# Patient Record
Sex: Female | Born: 1940 | Race: Black or African American | Hispanic: No | Marital: Married | Smoking: Former smoker
Health system: Southern US, Community
[De-identification: ages and names within clinical notes are randomized; demographics above are authoritative.]

## PROBLEM LIST (undated history)

## (undated) ENCOUNTER — Emergency Department (HOSPITAL_COMMUNITY): Admission: EM | Payer: Medicare Other | Source: Home / Self Care

## (undated) DIAGNOSIS — Q2730 Arteriovenous malformation, site unspecified: Secondary | ICD-10-CM

## (undated) DIAGNOSIS — F419 Anxiety disorder, unspecified: Secondary | ICD-10-CM

## (undated) DIAGNOSIS — H539 Unspecified visual disturbance: Secondary | ICD-10-CM

## (undated) DIAGNOSIS — N179 Acute kidney failure, unspecified: Secondary | ICD-10-CM

## (undated) DIAGNOSIS — R296 Repeated falls: Secondary | ICD-10-CM

## (undated) DIAGNOSIS — J189 Pneumonia, unspecified organism: Secondary | ICD-10-CM

## (undated) DIAGNOSIS — I2510 Atherosclerotic heart disease of native coronary artery without angina pectoris: Secondary | ICD-10-CM

## (undated) DIAGNOSIS — F329 Major depressive disorder, single episode, unspecified: Secondary | ICD-10-CM

## (undated) DIAGNOSIS — R569 Unspecified convulsions: Secondary | ICD-10-CM

## (undated) DIAGNOSIS — K922 Gastrointestinal hemorrhage, unspecified: Secondary | ICD-10-CM

## (undated) DIAGNOSIS — F32A Depression, unspecified: Secondary | ICD-10-CM

## (undated) DIAGNOSIS — N189 Chronic kidney disease, unspecified: Secondary | ICD-10-CM

## (undated) DIAGNOSIS — M069 Rheumatoid arthritis, unspecified: Secondary | ICD-10-CM

## (undated) DIAGNOSIS — I82409 Acute embolism and thrombosis of unspecified deep veins of unspecified lower extremity: Secondary | ICD-10-CM

## (undated) DIAGNOSIS — R011 Cardiac murmur, unspecified: Secondary | ICD-10-CM

## (undated) DIAGNOSIS — I5032 Chronic diastolic (congestive) heart failure: Secondary | ICD-10-CM

## (undated) DIAGNOSIS — R29898 Other symptoms and signs involving the musculoskeletal system: Secondary | ICD-10-CM

## (undated) DIAGNOSIS — E7800 Pure hypercholesterolemia, unspecified: Secondary | ICD-10-CM

## (undated) DIAGNOSIS — R55 Syncope and collapse: Secondary | ICD-10-CM

## (undated) DIAGNOSIS — I422 Other hypertrophic cardiomyopathy: Secondary | ICD-10-CM

## (undated) DIAGNOSIS — I10 Essential (primary) hypertension: Secondary | ICD-10-CM

## (undated) DIAGNOSIS — I340 Nonrheumatic mitral (valve) insufficiency: Secondary | ICD-10-CM

## (undated) DIAGNOSIS — N200 Calculus of kidney: Secondary | ICD-10-CM

## (undated) DIAGNOSIS — G43909 Migraine, unspecified, not intractable, without status migrainosus: Secondary | ICD-10-CM

## (undated) HISTORY — DX: Syncope and collapse: R55

## (undated) HISTORY — PX: CATARACT EXTRACTION W/ INTRAOCULAR LENS  IMPLANT, BILATERAL: SHX1307

## (undated) HISTORY — DX: Depression, unspecified: F32.A

## (undated) HISTORY — PX: ABDOMINAL HYSTERECTOMY: SHX81

## (undated) HISTORY — PX: TOENAIL AVULSION: SHX1074

## (undated) HISTORY — DX: Unspecified visual disturbance: H53.9

## (undated) HISTORY — DX: Essential (primary) hypertension: I10

## (undated) HISTORY — PX: CARPAL TUNNEL RELEASE: SHX101

## (undated) HISTORY — DX: Rheumatoid arthritis, unspecified: M06.9

## (undated) HISTORY — DX: Pure hypercholesterolemia, unspecified: E78.00

## (undated) HISTORY — DX: Major depressive disorder, single episode, unspecified: F32.9

---
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
## 1998-12-07 ENCOUNTER — Ambulatory Visit (HOSPITAL_COMMUNITY): Admission: RE | Admit: 1998-12-07 | Discharge: 1998-12-07 | Payer: Self-pay | Admitting: *Deleted

## 1998-12-08 ENCOUNTER — Encounter: Payer: Self-pay | Admitting: *Deleted

## 1999-03-30 ENCOUNTER — Other Ambulatory Visit: Admission: RE | Admit: 1999-03-30 | Discharge: 1999-03-30 | Payer: Self-pay | Admitting: Obstetrics and Gynecology

## 2000-04-03 ENCOUNTER — Other Ambulatory Visit: Admission: RE | Admit: 2000-04-03 | Discharge: 2000-04-03 | Payer: Self-pay | Admitting: Obstetrics and Gynecology

## 2001-03-14 ENCOUNTER — Encounter: Admission: RE | Admit: 2001-03-14 | Discharge: 2001-06-12 | Payer: Self-pay | Admitting: Internal Medicine

## 2001-04-15 ENCOUNTER — Other Ambulatory Visit: Admission: RE | Admit: 2001-04-15 | Discharge: 2001-04-15 | Payer: Self-pay | Admitting: Internal Medicine

## 2001-07-22 ENCOUNTER — Encounter (INDEPENDENT_AMBULATORY_CARE_PROVIDER_SITE_OTHER): Payer: Self-pay | Admitting: *Deleted

## 2001-07-22 ENCOUNTER — Ambulatory Visit (HOSPITAL_COMMUNITY): Admission: RE | Admit: 2001-07-22 | Discharge: 2001-07-22 | Payer: Self-pay | Admitting: Gastroenterology

## 2001-09-19 ENCOUNTER — Other Ambulatory Visit: Admission: RE | Admit: 2001-09-19 | Discharge: 2001-09-19 | Payer: Self-pay | Admitting: Obstetrics & Gynecology

## 2001-10-22 ENCOUNTER — Encounter (INDEPENDENT_AMBULATORY_CARE_PROVIDER_SITE_OTHER): Payer: Self-pay | Admitting: Specialist

## 2001-10-22 ENCOUNTER — Ambulatory Visit (HOSPITAL_COMMUNITY): Admission: RE | Admit: 2001-10-22 | Discharge: 2001-10-22 | Payer: Self-pay | Admitting: Obstetrics & Gynecology

## 2002-07-25 ENCOUNTER — Encounter: Admission: RE | Admit: 2002-07-25 | Discharge: 2002-07-25 | Payer: Self-pay | Admitting: Rheumatology

## 2002-07-25 ENCOUNTER — Encounter: Payer: Self-pay | Admitting: Rheumatology

## 2003-07-07 ENCOUNTER — Encounter: Admission: RE | Admit: 2003-07-07 | Discharge: 2003-07-07 | Payer: Self-pay | Admitting: Internal Medicine

## 2005-08-18 ENCOUNTER — Observation Stay (HOSPITAL_COMMUNITY): Admission: EM | Admit: 2005-08-18 | Discharge: 2005-08-19 | Payer: Self-pay | Admitting: Emergency Medicine

## 2006-05-23 ENCOUNTER — Inpatient Hospital Stay (HOSPITAL_COMMUNITY): Admission: EM | Admit: 2006-05-23 | Discharge: 2006-05-29 | Payer: Self-pay | Admitting: *Deleted

## 2006-09-07 ENCOUNTER — Emergency Department (HOSPITAL_COMMUNITY): Admission: EM | Admit: 2006-09-07 | Discharge: 2006-09-07 | Payer: Self-pay | Admitting: Emergency Medicine

## 2008-01-07 ENCOUNTER — Emergency Department (HOSPITAL_COMMUNITY): Admission: EM | Admit: 2008-01-07 | Discharge: 2008-01-07 | Payer: Self-pay | Admitting: Emergency Medicine

## 2008-04-14 ENCOUNTER — Encounter: Admission: RE | Admit: 2008-04-14 | Discharge: 2008-04-14 | Payer: Self-pay | Admitting: Internal Medicine

## 2008-04-17 ENCOUNTER — Encounter: Admission: RE | Admit: 2008-04-17 | Discharge: 2008-04-17 | Payer: Self-pay | Admitting: Internal Medicine

## 2008-05-22 ENCOUNTER — Encounter: Admission: RE | Admit: 2008-05-22 | Discharge: 2008-05-22 | Payer: Self-pay | Admitting: Neurology

## 2008-07-21 ENCOUNTER — Encounter: Admission: RE | Admit: 2008-07-21 | Discharge: 2008-07-21 | Payer: Self-pay | Admitting: Endocrinology

## 2008-07-21 ENCOUNTER — Other Ambulatory Visit: Admission: RE | Admit: 2008-07-21 | Discharge: 2008-07-21 | Payer: Self-pay | Admitting: Interventional Radiology

## 2008-07-21 ENCOUNTER — Encounter (INDEPENDENT_AMBULATORY_CARE_PROVIDER_SITE_OTHER): Payer: Self-pay | Admitting: Interventional Radiology

## 2008-12-16 ENCOUNTER — Emergency Department (HOSPITAL_COMMUNITY): Admission: EM | Admit: 2008-12-16 | Discharge: 2008-12-16 | Payer: Self-pay | Admitting: Emergency Medicine

## 2009-02-08 ENCOUNTER — Inpatient Hospital Stay (HOSPITAL_COMMUNITY): Admission: EM | Admit: 2009-02-08 | Discharge: 2009-02-13 | Payer: Self-pay | Admitting: Emergency Medicine

## 2009-08-06 ENCOUNTER — Encounter: Admission: RE | Admit: 2009-08-06 | Discharge: 2009-08-06 | Payer: Self-pay | Admitting: Neurology

## 2009-12-06 ENCOUNTER — Emergency Department (HOSPITAL_COMMUNITY): Admission: EM | Admit: 2009-12-06 | Discharge: 2009-12-06 | Payer: Self-pay | Admitting: Emergency Medicine

## 2009-12-21 ENCOUNTER — Ambulatory Visit (HOSPITAL_COMMUNITY)
Admission: RE | Admit: 2009-12-21 | Discharge: 2009-12-21 | Payer: Self-pay | Source: Home / Self Care | Admitting: Otolaryngology

## 2010-02-06 ENCOUNTER — Encounter: Payer: Self-pay | Admitting: Internal Medicine

## 2010-02-07 ENCOUNTER — Encounter: Payer: Self-pay | Admitting: Neurology

## 2010-03-28 LAB — BASIC METABOLIC PANEL WITH GFR
Chloride: 105 meq/L (ref 96–112)
Creatinine, Ser: 1.42 mg/dL — ABNORMAL HIGH (ref 0.4–1.2)
GFR calc Af Amer: 44 mL/min — ABNORMAL LOW (ref >60)
Potassium: 3.9 meq/L (ref 3.5–5.1)
Sodium: 140 meq/L (ref 135–145)

## 2010-03-28 LAB — BASIC METABOLIC PANEL
BUN: 27 mg/dL — ABNORMAL HIGH (ref 6–23)
CO2: 25 mEq/L (ref 19–32)
Calcium: 9.9 mg/dL (ref 8.4–10.5)
GFR calc non Af Amer: 36 mL/min — ABNORMAL LOW (ref >60)
Glucose, Bld: 78 mg/dL (ref 70–99)

## 2010-03-28 LAB — CBC
HCT: 41.1 % (ref 36.0–46.0)
Hemoglobin: 14 g/dL (ref 12.0–15.0)
MCH: 29.4 pg (ref 26.0–34.0)
MCHC: 34.1 g/dL (ref 30.0–36.0)
MCV: 86.2 fL (ref 78.0–100.0)
Platelets: 296 K/uL (ref 150–400)
RBC: 4.77 MIL/uL (ref 3.87–5.11)
RDW: 13.9 % (ref 11.5–15.5)
WBC: 11 10*3/uL — ABNORMAL HIGH (ref 4.0–10.5)

## 2010-03-28 LAB — SURGICAL PCR SCREEN
MRSA, PCR: NEGATIVE
Staphylococcus aureus: NEGATIVE

## 2010-03-28 LAB — URINALYSIS, ROUTINE W REFLEX MICROSCOPIC
Bilirubin Urine: NEGATIVE
Glucose, UA: NEGATIVE mg/dL
Hgb urine dipstick: NEGATIVE
Ketones, ur: NEGATIVE mg/dL
Nitrite: NEGATIVE
Protein, ur: NEGATIVE mg/dL
Specific Gravity, Urine: 1.01 (ref 1.005–1.030)
Urobilinogen, UA: 1 mg/dL (ref 0.0–1.0)
pH: 5.5 (ref 5.0–8.0)

## 2010-03-29 ENCOUNTER — Other Ambulatory Visit: Payer: Self-pay | Admitting: Hematology and Oncology

## 2010-03-29 ENCOUNTER — Encounter: Payer: Self-pay | Admitting: Hematology and Oncology

## 2010-03-29 ENCOUNTER — Encounter (HOSPITAL_BASED_OUTPATIENT_CLINIC_OR_DEPARTMENT_OTHER): Payer: Medicare Other | Admitting: Internal Medicine

## 2010-03-29 ENCOUNTER — Ambulatory Visit (HOSPITAL_COMMUNITY)
Admission: RE | Admit: 2010-03-29 | Discharge: 2010-03-29 | Disposition: A | Discharge status: Home / Self Care | Payer: Medicare Other | Source: Ambulatory Visit | Attending: Hematology and Oncology | Admitting: Hematology and Oncology

## 2010-03-29 DIAGNOSIS — M899 Disorder of bone, unspecified: Secondary | ICD-10-CM | POA: Insufficient documentation

## 2010-03-29 DIAGNOSIS — C9000 Multiple myeloma not having achieved remission: Secondary | ICD-10-CM | POA: Insufficient documentation

## 2010-03-29 DIAGNOSIS — D472 Monoclonal gammopathy: Secondary | ICD-10-CM

## 2010-03-29 DIAGNOSIS — M5030 Other cervical disc degeneration, unspecified cervical region: Secondary | ICD-10-CM | POA: Insufficient documentation

## 2010-03-29 LAB — CBC WITH DIFFERENTIAL/PLATELET
Basophils Absolute: 0.1 10*3/uL (ref 0.0–0.1)
EOS%: 2.2 % (ref 0.0–7.0)
HCT: 35.9 % (ref 34.8–46.6)
LYMPH%: 27.5 % (ref 14.0–49.7)
NEUT%: 60.3 % (ref 38.4–76.8)
Platelets: 270 10*3/uL (ref 145–400)
RBC: 4.21 10*6/uL (ref 3.70–5.45)
WBC: 10 10*3/uL (ref 3.9–10.3)
lymph#: 2.7 10*3/uL (ref 0.9–3.3)

## 2010-03-29 LAB — COMPREHENSIVE METABOLIC PANEL
ALT: 15 U/L (ref 0–35)
AST: 25 U/L (ref 0–37)
BUN: 30 mg/dL — ABNORMAL HIGH (ref 6–23)
CO2: 27 mEq/L (ref 19–32)
Chloride: 104 mEq/L (ref 96–112)
Creatinine, Ser: 1.35 mg/dL — ABNORMAL HIGH (ref 0.40–1.20)
Glucose, Bld: 107 mg/dL — ABNORMAL HIGH (ref 70–99)
Potassium: 4.3 mEq/L (ref 3.5–5.3)

## 2010-03-29 LAB — DIFFERENTIAL
Basophils Absolute: 0 10*3/uL (ref 0.0–0.1)
Monocytes Absolute: 0.9 10*3/uL (ref 0.1–1.0)
Monocytes Relative: 9 % (ref 3–12)
Neutrophils Relative %: 63 % (ref 43–77)

## 2010-03-29 LAB — MORPHOLOGY: PLT EST: ADEQUATE

## 2010-03-29 LAB — POCT I-STAT, CHEM 8
Chloride: 110 mEq/L (ref 96–112)
Creatinine, Ser: 1.5 mg/dL — ABNORMAL HIGH (ref 0.4–1.2)
HCT: 42 % (ref 36.0–46.0)
Sodium: 142 mEq/L (ref 135–145)
TCO2: 25 mmol/L (ref 0–100)

## 2010-03-29 LAB — CBC
HCT: 38.6 % (ref 36.0–46.0)
MCH: 29.9 pg (ref 26.0–34.0)
MCHC: 34.3 g/dL (ref 30.0–36.0)
MCV: 87.3 fL (ref 78.0–100.0)
RDW: 13.9 % (ref 11.5–15.5)
WBC: 9.4 10*3/uL (ref 4.0–10.5)

## 2010-03-29 LAB — PROTIME-INR: Prothrombin Time: 14.1 seconds (ref 11.6–15.2)

## 2010-04-01 LAB — SPEP & IFE WITH QIG
Albumin ELP: 45 % — ABNORMAL LOW (ref 55.8–66.1)
Alpha-1-Globulin: 4.6 % (ref 2.9–4.9)
Alpha-2-Globulin: 13.2 % — ABNORMAL HIGH (ref 7.1–11.8)
Beta Globulin: 5.5 % (ref 4.7–7.2)
Total Protein, Serum Electrophoresis: 8.2 g/dL (ref 6.0–8.3)

## 2010-04-01 LAB — KAPPA/LAMBDA LIGHT CHAINS: Kappa free light chain: 2.34 mg/dL — ABNORMAL HIGH (ref 0.33–1.94)

## 2010-04-03 LAB — DIFFERENTIAL
Basophils Absolute: 0 10*3/uL (ref 0.0–0.1)
Basophils Relative: 0 % (ref 0–1)
Eosinophils Absolute: 0 10*3/uL (ref 0.0–0.7)
Eosinophils Relative: 0 % (ref 0–5)
Lymphocytes Relative: 3 % — ABNORMAL LOW (ref 12–46)
Lymphocytes Relative: 3 % — ABNORMAL LOW (ref 12–46)
Monocytes Relative: 3 % (ref 3–12)
Neutro Abs: 24.7 10*3/uL — ABNORMAL HIGH (ref 1.7–7.7)
Neutrophils Relative %: 93 % — ABNORMAL HIGH (ref 43–77)

## 2010-04-03 LAB — COMPREHENSIVE METABOLIC PANEL
ALT: 16 U/L (ref 0–35)
ALT: 16 U/L (ref 0–35)
AST: 28 U/L (ref 0–37)
Albumin: 2.2 g/dL — ABNORMAL LOW (ref 3.5–5.2)
Albumin: 2.5 g/dL — ABNORMAL LOW (ref 3.5–5.2)
Albumin: 2.8 g/dL — ABNORMAL LOW (ref 3.5–5.2)
Alkaline Phosphatase: 75 U/L (ref 39–117)
Alkaline Phosphatase: 76 U/L (ref 39–117)
BUN: 38 mg/dL — ABNORMAL HIGH (ref 6–23)
CO2: 21 mEq/L (ref 19–32)
Calcium: 7.8 mg/dL — ABNORMAL LOW (ref 8.4–10.5)
Calcium: 8 mg/dL — ABNORMAL LOW (ref 8.4–10.5)
Chloride: 113 mEq/L — ABNORMAL HIGH (ref 96–112)
Creatinine, Ser: 2.16 mg/dL — ABNORMAL HIGH (ref 0.4–1.2)
Creatinine, Ser: 2.33 mg/dL — ABNORMAL HIGH (ref 0.4–1.2)
GFR calc Af Amer: 27 mL/min — ABNORMAL LOW (ref >60)
GFR calc Af Amer: 35 mL/min — ABNORMAL LOW (ref >60)
GFR calc non Af Amer: 23 mL/min — ABNORMAL LOW (ref >60)
Glucose, Bld: 89 mg/dL (ref 70–99)
Potassium: 3.1 mEq/L — ABNORMAL LOW (ref 3.5–5.1)
Potassium: 3.9 mEq/L (ref 3.5–5.1)
Sodium: 141 mEq/L (ref 135–145)
Total Bilirubin: 1 mg/dL (ref 0.3–1.2)
Total Protein: 6 g/dL (ref 6.0–8.3)
Total Protein: 6.7 g/dL (ref 6.0–8.3)

## 2010-04-03 LAB — RETICULOCYTES
RBC.: 3.56 MIL/uL — ABNORMAL LOW (ref 3.87–5.11)
Retic Count, Absolute: 28.5 10*3/uL (ref 19.0–186.0)
Retic Ct Pct: 0.8 % (ref 0.4–3.1)

## 2010-04-03 LAB — BASIC METABOLIC PANEL
BUN: 14 mg/dL (ref 6–23)
BUN: 15 mg/dL (ref 6–23)
CO2: 18 mEq/L — ABNORMAL LOW (ref 19–32)
Calcium: 9.2 mg/dL (ref 8.4–10.5)
Chloride: 113 mEq/L — ABNORMAL HIGH (ref 96–112)
Chloride: 117 mEq/L — ABNORMAL HIGH (ref 96–112)
Creatinine, Ser: 1.1 mg/dL (ref 0.4–1.2)
GFR calc non Af Amer: 39 mL/min — ABNORMAL LOW (ref >60)
GFR calc non Af Amer: 49 mL/min — ABNORMAL LOW (ref >60)
Glucose, Bld: 83 mg/dL (ref 70–99)
Glucose, Bld: 91 mg/dL (ref 70–99)
Potassium: 3.7 mEq/L (ref 3.5–5.1)
Potassium: 4 mEq/L (ref 3.5–5.1)
Potassium: 4.7 mEq/L (ref 3.5–5.1)
Sodium: 140 mEq/L (ref 135–145)

## 2010-04-03 LAB — GLUCOSE, CAPILLARY
Glucose-Capillary: 102 mg/dL — ABNORMAL HIGH (ref 70–99)
Glucose-Capillary: 102 mg/dL — ABNORMAL HIGH (ref 70–99)
Glucose-Capillary: 120 mg/dL — ABNORMAL HIGH (ref 70–99)
Glucose-Capillary: 175 mg/dL — ABNORMAL HIGH (ref 70–99)
Glucose-Capillary: 77 mg/dL (ref 70–99)
Glucose-Capillary: 78 mg/dL (ref 70–99)
Glucose-Capillary: 89 mg/dL (ref 70–99)
Glucose-Capillary: 92 mg/dL (ref 70–99)
Glucose-Capillary: 99 mg/dL (ref 70–99)

## 2010-04-03 LAB — CULTURE, BLOOD (ROUTINE X 2)

## 2010-04-03 LAB — CBC
HCT: 28.3 % — ABNORMAL LOW (ref 36.0–46.0)
HCT: 30.7 % — ABNORMAL LOW (ref 36.0–46.0)
HCT: 30.8 % — ABNORMAL LOW (ref 36.0–46.0)
HCT: 35.3 % — ABNORMAL LOW (ref 36.0–46.0)
HCT: 37.4 % (ref 36.0–46.0)
Hemoglobin: 9.7 g/dL — ABNORMAL LOW (ref 12.0–15.0)
MCHC: 32.7 g/dL (ref 30.0–36.0)
MCHC: 33.2 g/dL (ref 30.0–36.0)
MCHC: 33.8 g/dL (ref 30.0–36.0)
MCV: 86.9 fL (ref 78.0–100.0)
MCV: 87.8 fL (ref 78.0–100.0)
MCV: 88.4 fL (ref 78.0–100.0)
MCV: 88.4 fL (ref 78.0–100.0)
MCV: 88.9 fL (ref 78.0–100.0)
Platelets: 178 10*3/uL (ref 150–400)
Platelets: 199 10*3/uL (ref 150–400)
Platelets: 201 10*3/uL (ref 150–400)
Platelets: 232 10*3/uL (ref 150–400)
Platelets: 246 10*3/uL (ref 150–400)
RBC: 3.66 MIL/uL — ABNORMAL LOW (ref 3.87–5.11)
RDW: 14 % (ref 11.5–15.5)
RDW: 14.4 % (ref 11.5–15.5)
RDW: 14.6 % (ref 11.5–15.5)
RDW: 14.9 % (ref 11.5–15.5)
RDW: 15.2 % (ref 11.5–15.5)
WBC: 15.1 10*3/uL — ABNORMAL HIGH (ref 4.0–10.5)
WBC: 16.1 10*3/uL — ABNORMAL HIGH (ref 4.0–10.5)
WBC: 16.7 10*3/uL — ABNORMAL HIGH (ref 4.0–10.5)
WBC: 20.7 10*3/uL — ABNORMAL HIGH (ref 4.0–10.5)
WBC: 26.3 10*3/uL — ABNORMAL HIGH (ref 4.0–10.5)

## 2010-04-03 LAB — URINALYSIS, ROUTINE W REFLEX MICROSCOPIC
Bilirubin Urine: NEGATIVE
Glucose, UA: NEGATIVE mg/dL
Hgb urine dipstick: NEGATIVE
Ketones, ur: NEGATIVE mg/dL
Nitrite: NEGATIVE
Specific Gravity, Urine: 1.007 (ref 1.005–1.030)
Specific Gravity, Urine: 1.017 (ref 1.005–1.030)
pH: 6.5 (ref 5.0–8.0)

## 2010-04-03 LAB — URINE CULTURE
Colony Count: >=100000
Colony Count: NO GROWTH
Culture: NO GROWTH

## 2010-04-03 LAB — LACTIC ACID, PLASMA
Lactic Acid, Venous: 0.7 mmol/L (ref 0.5–2.2)
Lactic Acid, Venous: 1.1 mmol/L (ref 0.5–2.2)

## 2010-04-03 LAB — URINE MICROSCOPIC-ADD ON

## 2010-04-03 LAB — MAGNESIUM: Magnesium: 1.6 mg/dL (ref 1.5–2.5)

## 2010-04-03 LAB — TSH: TSH: 0.058 u[IU]/mL — ABNORMAL LOW (ref 0.350–4.500)

## 2010-04-03 LAB — SODIUM, URINE, RANDOM: Sodium, Ur: 97 mEq/L

## 2010-04-03 LAB — HEMOGLOBIN A1C: Hgb A1c MFr Bld: 5.9 % (ref 4.6–6.1)

## 2010-04-03 LAB — IRON AND TIBC: UIBC: 135 ug/dL

## 2010-04-03 LAB — CREATININE, URINE, RANDOM: Creatinine, Urine: 28.2 mg/dL

## 2010-04-07 ENCOUNTER — Encounter (HOSPITAL_BASED_OUTPATIENT_CLINIC_OR_DEPARTMENT_OTHER): Payer: Medicare Other | Admitting: Hematology and Oncology

## 2010-04-07 DIAGNOSIS — D472 Monoclonal gammopathy: Secondary | ICD-10-CM

## 2010-04-12 ENCOUNTER — Encounter (HOSPITAL_BASED_OUTPATIENT_CLINIC_OR_DEPARTMENT_OTHER): Payer: Medicare Other | Admitting: Hematology and Oncology

## 2010-04-12 DIAGNOSIS — D472 Monoclonal gammopathy: Secondary | ICD-10-CM

## 2010-04-15 ENCOUNTER — Other Ambulatory Visit: Payer: Self-pay | Admitting: Hematology and Oncology

## 2010-04-15 ENCOUNTER — Encounter (HOSPITAL_BASED_OUTPATIENT_CLINIC_OR_DEPARTMENT_OTHER): Payer: Medicare Other | Admitting: Hematology and Oncology

## 2010-04-15 ENCOUNTER — Other Ambulatory Visit (HOSPITAL_COMMUNITY)
Admission: RE | Admit: 2010-04-15 | Discharge: 2010-04-15 | Disposition: A | Discharge status: Home / Self Care | Payer: Medicare Other | Source: Ambulatory Visit | Attending: Hematology and Oncology | Admitting: Hematology and Oncology

## 2010-04-15 DIAGNOSIS — D649 Anemia, unspecified: Secondary | ICD-10-CM

## 2010-04-15 DIAGNOSIS — D72829 Elevated white blood cell count, unspecified: Secondary | ICD-10-CM

## 2010-04-21 ENCOUNTER — Encounter (HOSPITAL_BASED_OUTPATIENT_CLINIC_OR_DEPARTMENT_OTHER): Payer: Medicare Other | Admitting: Hematology and Oncology

## 2010-04-21 DIAGNOSIS — D472 Monoclonal gammopathy: Secondary | ICD-10-CM

## 2010-06-03 NOTE — Op Note (Signed)
   NAME:  [name], [name]                         ACCOUNT NO.:  [id number]   MEDICAL RECORD NO.:  [id number]                   PATIENT TYPE:  AMB   LOCATION:  SDC                                  FACILITY:  [facility]   PHYSICIAN:  Agnes Lawrence, M.D.         DATE OF BIRTH:  1941/01/15   DATE OF PROCEDURE:  10/22/2001  DATE OF DISCHARGE:                                 OPERATIVE REPORT   PREOPERATIVE DIAGNOSES:  Postmenopausal bleeding with previous in office  biopsies demonstrating vaginal hyperplasia.   POSTOPERATIVE DIAGNOSES:  Postmenopausal bleeding with previous in office  biopsies demonstrating vaginal hyperplasia.   PROCEDURE:  Biopsy of vaginal cuff as well as right-sided vaginal introitus  biopsy.   SURGEON:  Agnes Lawrence, M.D.   ANESTHESIA:  Spinal.   COMPLICATIONS:  None.   ESTIMATED BLOOD LOSS:  Approximately 50 cc.   FINDINGS:  Upon inspection of the vagina under anesthesia, the vaginal wall  was palpably thickened.  There was a slightly raised hemorrhagic appearing  lesion at the right introitus approximately 1 cm in diameter.   PROCEDURE:  The patient was taken to the operating room.  A sterile weighted  speculum was placed in the patient's vagina as well as Deavers for  retraction.  The vaginal mucosa of the cuff was then grasped with Allis  clamps.  The vaginal mucosa was then incised and an approximate 2 cm in  diameter biopsy was then taken of the vaginal cuff.  The vaginal mucosa cut  edges were then reapproximated with interrupted sutures of 2-0 chromic.  Excellent hemostasis was obtained.  The lesion noted on the right introitus  was then grasped as well with an Allis clamp and this was excised with a  scalpel.  Again, the cut mucosal edges were then reapproximated with  interrupted sutures of 3-0 Vicryl.  Again, excellent hemostasis was noted.  The patient tolerated procedure well.  The patient was taken to the PACU in  stable condition.   PATHOLOGY:  Vaginal cuff biopsy and vaginal introitus biopsy.                                               Agnes Lawrence, M.D.    Howell Pringle  D:  10/22/2001  T:  10/22/2001  Job:  [id number]

## 2010-06-03 NOTE — Discharge Summary (Signed)
[name], [name]               ACCOUNT NO.:  [id number]   MEDICAL RECORD NO.:  [id number]          PATIENT TYPE:  INP   LOCATION:  1430                         FACILITY:  [facility]   PHYSICIAN:  Rexene Alberts, M.D.    DATE OF BIRTH:  1940-04-15   DATE OF ADMISSION:  05/22/2006  DATE OF DISCHARGE:  05/29/2006                               DISCHARGE SUMMARY   PRIMARY CARE PHYSICIAN:  Dr. Glendale Chard.   DISCHARGE DIAGNOSES:  1. Escherichia coli bacteremia.  The Escherichia coli was pan      sensitive.  The patient was treated with 7 days of intravenous      Zosyn.  2. Urinary tract infection.  The urine culture was not obtained during      hospital course.  However, more than likely, it was secondary to      Escherichia coli.  3. Pneumonia.  4. Hypertension.  5. Altered mental status secondary to number one.  Clinically resolved      during the hospitalization  6. Leukocytosis secondary to number one.  The patient's white blood      cell count was 22.3 on admission and it is now 15.  7. Tachycardia and fever on admission, thought to be secondary to      number one.  Both resolved during hospital course.  8. Mild hematuria thought to be secondary to the urinary tract      infection, Foley catheter, and Lovenox.  9. Low TSH and normal T4.   SECONDARY DISCHARGE DIAGNOSES:  1. Hypertension.  2. Rheumatoid arthritis.  3. Depression.  4. Osteoporosis.   DISCHARGE MEDICATIONS:  1. Augmentin 500 mg b.i.d. for four more days.  2. Toprol XL 25 mg, 1 tablet daily (new medication).  3. Methotrexate 2.5 mg, 7 pills every Wednesday.  4. Detrol 4 mg daily.  5. Effexor XR 75 mg daily.  6. Actonel 35 mg weekly.  7. Humira one-shot every 2 weeks.  8. Maxinate 1 pill daily.  9. Phenergan 12.5 mg every 4 hours as needed for nausea.   HISTORY OF PRESENT ILLNESS:  The patient is a 70 year old woman with a  past medical history significant for rheumatoid arthritis.  She  presented to the  emergency department on [id number] with several episodes  of urinary incontinence and confusion.  When she arrived to the  emergency department, her temperature was found to be elevated at 104.8.  Her urinalysis was positive for WBCs.  She was given 1 gram of Rocephin  in the emergency department and subsequently admitted to the step-down  unit for further management.   For additional details, please see the dictated history and physical by  Dr. Arnoldo Morale.   HOSPITAL COURSE:  1. URINARY TRACT INFECTION/E-COLI BACTEREMIA:  On admission, the      patient was quite febrile.  Her heart rate was elevated at 155      beats per minute and her blood pressure was 167/93.  She was      oxygenating 98% on 2 liters of nasal cannula oxygen.  As stated      above, she  was given 1 gram of Rocephin intravenously in the      emergency department.  She was also given a bolus of normal saline.      On admission, her white blood cell count was 22.3.  Blood cultures      were ordered.  Her urinalysis was positive for bacteria and white      blood cells.  However, unfortunately, the urine culture was not      performed.  The patient was started on empiric antibiotic treatment      with Zosyn and vancomycin for coverage of possible sepsis secondary      to the urinary tract infection.  Over the course of the      hospitalization, the blood cultures became positive for E-coli,      which was pansensitive to the antibiotics.  Gradually, the      patient's mental status improved.  She became less and less      symptomatic.  There was one episode of gross hematuria thought to      be secondary to Lovenox.  The Lovenox was discontinued.  The Foley      catheter was subsequently removed.  Her white blood cell count is      now 15,000.  She has been afebrile for several days.  She received      a total of 7 days of Zosyn and vancomycin.  She will be discharged      to home on four more days of Augmentin 500 mg  b.i.d.   1. PNEUMONIA:  On hospital day #3, the patient complained of shortness      of breath.  A chest x-ray was obtained, and it revealed small to      moderate bilateral pleural effusions, interstitial opacities and      right perihilar air space opacity, possibly reflecting atypical      pneumonia or pulmonary edema.  The patient was treated with Lasix      intravenously.  Antibiotic therapy was continued with vancomycin      and Zosyn.  She was also treated with as-needed Xopenex.  Following      these measures, the patient's shortness of breath completely      resolved.  A followup chest x-ray on May 13. 2008 revealed clearing      of the right basilar opacity.   1. HYPERTENSION:  The patient's blood pressures were moderately      elevated during hospital course.  She was initially started on      metoprolol t.i.d.; however, for better compliance, she was      discharged to home on Toprol XL 25 mg daily.  Her blood pressure      prior to hospital discharge was 134/60.   1. LOW TSH AND NORMAL FREE T4:  The patient's TSH was assessed during      the hospital course.  It was found to be low at 0.023.  Followup      lab work revealed a free T4 of 0.92 (within normal limits) and a      normal free T3 of 2.6.   1. MILD DECONDITIONING:  The physical and occupational therapist      evaluated the patient during the hospital course.  They recommended      home health PT and a registered nurse for home safety evaluation.      She had no occupational therapy needs.  However, the occupational  therapist recommended a tub transfer bench, which was ordered.   DISCHARGE DISPOSITION:  The patient was discharged to home in improved  and stable condition.  She was advised to follow up with her primary  care physician Dr. Glendale Chard in 1 week and her rheumatologist, Dr.  Estanislado Pandy in 2-4 weeks.      Rexene Alberts, M.D.  Electronically Signed    DF/MEDQ  D:  05/29/2006  T:   05/29/2006  Job:  [id number]   cc:   Theda Belfast. Baird Cancer, M.D.  Fax: [phone]   Leary Roca, MD  Fax: [phone]

## 2010-06-03 NOTE — Discharge Summary (Signed)
NAMEDAISE, GIOVANELLI               ACCOUNT NO.:  [id number]   MEDICAL RECORD NO.:  [id number]          PATIENT TYPE:  INP   LOCATION:  5039                         FACILITY:  [facility]   PHYSICIAN:  Cyril Mourning, D.O.    DATE OF BIRTH:  [birth year]   DATE OF ADMISSION:  08/18/2005  DATE OF DISCHARGE:  08/19/2005                                 DISCHARGE SUMMARY   PRIMARY CARE PHYSICIAN:  Robyn N. Baird Cancer, M.D.   RHEUMATOLOGIST:  Leary Roca, MD.   REASON FOR ADMISSION:  Rash following treatment for pyelonephritis and E.  coli bacteremia.   DIAGNOSES AT TIME OF DISCHARGE:  1.  Palpable purpura without coagulopathy and no evidence of      thrombocytopenia; and no pulmonary or renal manifestations.  Clinically      felt to be related to a hypersensitivity reaction either to recently      treated infection or perhaps ciprofloxacin.  2.  Intervally treated Escherichia coli bacteremia/pyelonephritis, initially      diagnosed 08/10/2005 with clinical response to Cipro in the outpatient      setting.  She will conclude therapy with Keflex until 08/24/2005 as her      cultures revealed a pansensitive strain of E. coli; and this will      conclude 14 days of therapy.  3.  Known rheumatoid arthritis.  4.  Status post cholecystectomy, hysterectomy, and a remote history of a      deep venous thrombus.   MEDICATIONS ON DISCHARGE:  Unfortunately Ms. Vanrossum is not absolutely clear  on her medications though she brings a list there are no frequencies  provided.  I have instructed her to resume her Nabumetone as prescribed by  her primary care physician including folic acid, hydrochloric Bextra.  She  has 2 listings:  The generic meloxicam as well as the brand Mobic.  I have  educated her on these to not exceed 2 of these tablets for a 24-hour period.  She takes methotrexate on a weekly basis.  Effexor, she states that she has  not been taking.  Zyrtec and Detrol.   LABORATORY DATA:  As noted  above, her coags including her PT/INR and PTT  were all within normal range with PTT being 25, her PT was 14.3, INR was  1.1.  Her urinalysis did reveal 3-6 wbc's and 0-2 rbc's.  Her urinalysis  revealed trace leukocyte esterase and urine culture currently is negative to  date.  Her CBC did reveal a WBC of 15.1, hemoglobin of 13.9, platelet count  of [id number], basic metabolic panel of Q000111Q sodium, potassium 4.2, chloride 106,  CO2 29, BUN 11, creatinine 1.0.  Her chest x-ray revealed chronic bronchitic  changes and no evidence of pneumonia.   These tests are being ordered prior to discharge and the patient is  instructed to followup on these results as most of these are send out tests  that will not be available for several days.  She is getting a p-ANCA, c-  ANCA, C3 and C4 complement levels as well as an antidouble stranded DNA.  She  is getting a T3 and T4 to followup on a low TSH during this  hospitalization.  It was recommended that these be correlated and repeated  if they are abnormal.   HISTORY OF PRESENTING ILLNESS:  Ms. Kupka is a very pleasant 70 year old  African-American female who was in her usual state of health up until a  little over a week ago when she developed some nausea, vomiting, and  symptoms felt to be related to urinary tract infection.  She had chills.  She had been discovered to have a pansensitive E. coli urinary tract  infection/pyelonephritis and had been initiated on ciprofloxacin starting at  that time and having completed almost a weeks' worth of therapy with  ciprofloxacin; she has developed a lower extremity swelling and rash which  began with edema and discomfort though had resolved with regard to the  swelling at the time of presentation.  She was directed to the emergency  department for further evaluation.   HOSPITAL COURSE:  Ms. Youngstrom presented to the emergency department.  She had  no immediate laboratory data available with the exception that 1-week  prior  cultures of her blood and urine that were consistent with E. coli bacteremia  due to pyelonephritis.  Again, her E. coli was pansensitive.  She does have  a previous history of rheumatoid arthritis managed by Dr. Estanislado Pandy.  In any  event, her laboratory data did become available revealing no evidence of  thrombocytopenia nor evidence of coagulopathy.  She did not complain of  abdominal discomfort; and, in fact, clinically she had appeared to  completely resolve with the reference to her bacteremia and pyelonephritis.  Her medications were reviewed and it was not absolutely clear as to the  etiology of her palpable purpura without the classic constellation of  symptoms consistent with Henoch-Schonlein purpura.  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  I do not see clinical  manifestations of pulmonary, renal involvement, or other systemic  involvement to suggest the vasculitis; however, I will send a few send out  laboratory studies that her primary care physician and rheumatologist can  followup in the outpatient setting.  I feel that these are of low clinical  likelihood; however, these may help in establishing the diagnosis of a  likely hypersensitivity response.   I am recommending a followup series of labs within 1-2 weeks, via her  primary care physician.      Cyril Mourning, D.O.  Electronically Signed     ESS/MEDQ  D:  08/19/2005  T:  08/19/2005  Job:  [id number]   cc:   Theda Belfast. Baird Cancer, M.D.  Leary Roca, MD

## 2010-06-03 NOTE — H&P (Signed)
NAME:  [name], [name]                         ACCOUNT NO.:  [id number]   MEDICAL RECORD NO.:  [id number]                   PATIENT TYPE:  AMB   LOCATION:  SDC                                  FACILITY:  [facility]   PHYSICIAN:  Agnes Lawrence, M.D.         DATE OF BIRTH:  1940/03/08   DATE OF ADMISSION:  DATE OF DISCHARGE:                                HISTORY & PHYSICAL   CHIEF COMPLAINT:  The patient is a 70 year old status post hysterectomy with  postmenopausal bleeding and a vaginal cuff lesion who presents for vaginal  cuff biopsy and vulva biopsies.   HISTORY OF PRESENT ILLNESS:  The patient has a two-year history of  intermittent vaginal spotting, occasionally this spotting is admixed with a  foul smelling discharge.  She has concomitant vaginal irritation.  She  denies any pelvic pain or change in her bowel or bladder habits. She denies  any weight loss.  Previous work up has included an evaluation by Dr. Collene Mares, a  gastroenterologist, and a possible pelvic ultrasound.   On exam in the office there is a raised lesion in the cuff area that was  biopsied and demonstrated hyperplastic squamous mucosa with acute vaginitis  and mucosal erosion.  There is no dysplasia and fungal stains were negative.   A Pap smear from September demonstrated benign _________ changes and  trichomonas.   PAST OB/GYN HISTORY:  As above. The patient has had a hysterectomy secondary  to dysfunctional uterine bleeding.  This was 20 years ago and last mammogram  was seven months ago normal.   PAST MEDICAL HISTORY:  Depression, anxiety disorder, distant history of deep  venous thrombosis on combination of oral contraceptives in the late 1970s.   PAST SURGICAL HISTORY:  Cholecystectomy and foot surgery.   SOCIAL HISTORY:  Married living with spouse. She is not exercising  regularly.  She has smoked 30 to 40 years.  She does not give any  significant use of alcohol usage and she reports a minimal  amount of  caffeinated beverages daily and she denies any illicit drug use.   FAMILY HISTORY:  Breast cancer and myocardial infarction.   REVIEW OF SYSTEMS:  GENERAL:  Normal activity and energy level. No change in  appetite.  No major weight gain or loss.  No malaise, chills, fever,  diaphoresis. GASTROINTESTINAL:  No food intolerance, abdominal pain, nausea,  vomiting, bloating, diarrhea, constipation, melena, hematochezia.  No change  in the caliber of her stools. GENITOURINARY:  As per history of  present  illness.   PHYSICAL EXAMINATION:  Height 5 feet 1 inches.  Weight 137 pounds.  Temperature 98.1.  Pulse 62.  Blood pressure 122/70. Respirations 12.  On  physical exam, African-American female appears stated age.  Normal body  habitus in no acute distress.  LUNGS:  Clear to auscultation bilaterally.  HEART:  Regular rate and rhythm.  ABDOMEN:  Soft, nontender without masses.  Bowel  sounds active.  PELVIC EXAM:  Vulva, labia majora: No irritation, masses, lesions or  discharge. Bartholin's glands:  No abscess, induration, discharge, masses or  inflammation.  Skene's:  No abscess, induration, discharge, masses or  inflammation.  Clitoris, labia majora:  There is atrophy of the labia minora  skin.  The skin is thin.  There was a hyperpigmented flat lesion less than  0.5 cm on the right.  On exam of the urethra meatus there is some  periurethral erythema noted.  Vagina:  The cuff is thickened.  Cervix is  absent.  Uterus is absent.  Adnexa:  No masses, organomegaly or guarding.  RECTOVAGINAL EXAM:  Confirms vaginal exam.   MEDICATIONS:  She denies.   ALLERGIES:  No known drug allergies.   ASSESSMENT:  Postmenopausal bleeding with a thickened lesion in the vaginal  cuff post hysterectomy.   PLAN:  Multiple vaginal biopsies including wide local biopsy of the vaginal  cuff.  Risks and benefits of surgery were reviewed with the patient.                                                 Agnes Lawrence, M.D.    Howell Pringle  D:  10/08/2001  T:  10/08/2001  Job:  [id number]

## 2010-06-03 NOTE — H&P (Signed)
[name], [name]               ACCOUNT NO.:  [id number]   MEDICAL RECORD NO.:  [id number]          PATIENT TYPE:  EMS   LOCATION:  ED                           [facility]ACILITY:  [facility]   PHYSICIAN:  Jana Hakim, M.D. DATE OF BIRTH:  24-May-1940   DATE OF ADMISSION:  05/22/2006  DATE OF DISCHARGE:                              HISTORY & PHYSICAL   PRIMARY CARE PHYSICIAN:  Dr. Glendale Chard.   CHIEF COMPLAINT:  Confusion.   HISTORY OF PRESENT ILLNESS:  This is a 70 year old female who was  brought to the emergency department by her husband after being found  confused and minimally verbal.  She was found sitting on the side of her  bed and got up to go to the bathroom several times and lost control of  her urine.  [name] describes that she was not getting  better, and he decided to bring her to the emergency department.  In the  emergency department, she was found to have a temperature of 104.8.  The  laboratory studies revealed a positive urinalysis..  The patient was  administered IV antibiotic therapy with Rocephin, was started on IV  fluids for rehydration therapy and was administered Tylenol for fever.   PAST MEDICAL HISTORY:  1. Rheumatoid arthritis.  2. Hypertension.   PAST SURGICAL HISTORY:  None.   MEDICATIONS:  Need to be verified.  [name] states that  the patient also takes HUMARA now for her rheumatoid arthritis 40 mg  subcu. once weekly and is on no other medications.   ALLERGIES:  NO KNOWN DRUG ALLERGIES.   SOCIAL HISTORY:  The patient is married.  She works at [employer].  She is a  smoker of one pack per day for many years.  She is a nondrinker.   FAMILY HISTORY:  Noncontributory.   REVIEW OF SYSTEMS:  Positive headache.  Positive fever.  No chills.  Positive confusion.  Positive incontinence of urine.  No seizure.  No  observed seizure activity.  No chest pain.  No shortness of breath.  No  cough, nausea, vomiting or diarrhea.  No  constipation.   PHYSICAL EXAMINATION:  GENERAL:  This is a 70 year old obese female in  no acute distress.  VITAL SIGNS:  Temperature was 104.8 on arrival.  Temperature is now down  to 101.5.  Blood pressure was initially 189/105 and is now 167/93.  Heart rate was 155 on arrival and is now 127.  Respirations were 24 on  arrival and are now 20.  O2 saturations 98-100%.  HEENT: Normocephalic, atraumatic.  Pupils equally round and reactive to  light.  Extraocular muscles are intact.  Funduscopic benign.  Oropharynx  with dry oral mucosa.  No exudates.  NECK:  Supple.  Full range of motion.  No thyromegaly, adenopathy or  jugular venous distension.  No meningismus.  CARDIOVASCULAR:  Tachycardiac rate and rhythm.  LUNGS:  Clear to auscultation bilaterally.  ABDOMEN:  Positive bowel sounds.  Soft, nontender, nondistended.  EXTREMITIES:  Without cyanosis, clubbing or edema.  NEUROLOGIC:  Generalized weakness and mild confusion.  Speech is sparse  but clear.  There are no focal deficits.   LABORATORY STUDIES:  White blood cell count 22.3, hemoglobin 14.8,  hematocrit 43.2, MCV 85.3, platelets 269,000, neutrophils 97%.  Sodium  135, potassium 3.6, chloride 103, carbon dioxide 23, BUN 18, creatinine  1.08 and glucose 110.  Urinalysis reveals large leukocyte esterase,  moderate urine hemoglobin and cloudy yellow in color.   ASSESSMENT:  This is a 70 year old female being admitted with:  1. Sepsis in the site of urinary tract infection, possible      pyelonephritis.  2. Urinary tract infection.  3. Dehydration.  4. Hypertension.  5. Rheumatoid arthritis.   PLAN:  The patient will be admitted to the step-down unit secondary to  potential for deterioration.  The patient will continue on IV antibiotic  therapy.  Vancomycin has been ordered as well as Zosyn.  The patient  will be medicated with both Tylenol and ibuprofen if needed for elevated  temperatures.  Also, a cooling blanket has been  ordered p.r.n. for  temperatures greater than 102.  Blood cultures were sent in the  emergency department along with urine cultures.  Antibiotic therapy will  be further adjusted pending culture results.  The patient's home  medications need to be verified.  The patient will continue to receive  IV fluids for rehydration therapy.  Her electrolytes will be replaced  p.r.n.  The patient has been placed on DVT and GI prophylaxis.      Jana Hakim, M.D.  Electronically Signed     HJ/MEDQ  D:  05/23/2006  T:  05/23/2006  Job:  [id number]   cc:   Theda Belfast. Baird Cancer, M.D.  Fax: [phone]

## 2010-06-03 NOTE — Op Note (Signed)
[place]  Patient:    [name], [name] Visit Number: [id number] MRN: [id number]          Service Type: END Location: ENDO Attending Physician:  Juanita Craver Dictated by:   Nelwyn Salisbury, M.D. Proc. Date: 07/22/01 Admit Date:  07/22/2001 Discharge Date: 07/22/2001   CC:         Bryon Lions, M.D.   Operative Report  DATE OF BIRTH:  10/05/1940  PROCEDURES PERFORMED:  Colonoscopy with snare polypectomy x 1.  ENDOSCOPIST:  Nelwyn Salisbury, M.D.  INSTRUMENTS USED:  Pediatric adjustable Olympus colonoscope.  INDICATIONS FOR PROCEDURE:  The patient is a 70 year old African-American female with guaiac positive stool, rule out colonic polyps, masses, hemorrhoids, etc.  PREPROCEDURE PREPARATION: Informed consent was procured from the patient and the patient was fasted for eight hours prior to the procedure and prepped with a bottle of magnesium citrate and a gallon of NuLytely the night prior to the procedure.  PREPROCEDURE PHYSICAL: The patient had stable vital signs. Neck was supple. Chest was clear to auscultation. S1, S2 regular. Abdomen soft with normal bowel sounds.  DESCRIPTION OF THE PROCEDURE: The patient was placed in the left lateral decubitus position and sedated with 70 mg of Demerol and 7 mg of Versed intravenously. Once the patient was adequately sedated and maintained on low flow oxygen and continuous cardiac monitoring, the Olympus video colonoscope was advanced from the rectum to the cecum with slight difficulty.  The patient had a very tortuous colon. The patients position was changed from the left lateral to the supine position and the endoscope was gently advanced into the cecum. The appendiceal orifice and the ileocecal valve were visualized and photographed. A small sessile polyp was snared from 40 cm. No other masses or polyps were seen. There was no evidence of diverticulosis or hemorrhoids. The patient had  somewhat lax sphincter tone.  IMPRESSION: 1. Small sessile polyp snared at 40 cm. 2. Tortuous colon, no evidence of diverticulosis or masses. 3. Lax sphincter tone.  RECOMMENDATIONS: 1. Await pathology results. 2. Repeat guaiac tests on an outpatient basis. 3. Avoid all nonsteroidals including aspirin for now. 4. Further recommendations will be made once the patients pathology results    are available.Dictated by:   Nelwyn Salisbury, M.D. Attending Physician:  Juanita Craver DD:  07/22/01 TD:  07/24/01 Job: [id number]

## 2010-06-03 NOTE — H&P (Signed)
[name], [name]               ACCOUNT NO.:  [id number]   MEDICAL RECORD NO.:  [id number]          PATIENT TYPE:  EMS   LOCATION:  MAJO                         FACILITY:  [facility]   PHYSICIAN:  Cyril Mourning, D.O.    DATE OF BIRTH:  1940/05/20   DATE OF ADMISSION:  08/18/2005  DATE OF DISCHARGE:                                HISTORY & PHYSICAL   PRIMARY CARE PHYSICIAN:  Robyn N. Baird Cancer, M.D.   CHIEF COMPLAINT:  Rash.   HISTORY OF PRESENTING ILLNESS:  [name] is a 70 year old pleasant African-  American female who [place], who had been  in her usual state of health up until a little over a week ago, when she had  developed some nausea, vomiting and symptoms felt to be related to a urinary  tract infection.  In fact, the only data that we have available was faxed  over from her primary care physician, Dr. Glendale Chard' office from  almost  2 weeks pror that revealed a pansensitive E. coli urinary tract infection  and concurretn e.coli bacteremia and it was conveyed that she was being sent  to the emergency department for refractory pyelonephritis.   In the emergency department where she was directed, we were immediately  called to admit her for evaluation of a purpuric rash that was noted in  triage, as she had not been evaluated by the emergency physician.  In any  event, she had no other complaints.  Her nausea and vomiting had resolved.  She simply was referred for further evaluation of this purpuric rash.  No  labs are available of recent, so we are ordering some now to assure that she  is not thrombocytopenic, and we will admit her for further inpatient  observation and evaluation.   PAST MEDICAL HISTORY:  1.  Rheumatoid arthritis managed by Dr. Estanislado Pandy in the outpatient setting.  2.  History of DVT in the distant past in the 1970s.  3.  She is status post cholecystectomy.  4.  She has had some hand surgery.  5.  She is also status  post a hysterectomy.   Her medications are provided by a list, though the frequencies she is unable  to completely clarify as there are no frequencies indicated on the list that  she has provided.  In any event, she is on:   1.  Nabumetone 750 mg daily.  2.  Folic acid 1 mg daily.  3.  Hydrochloroquine, she believes she takes 200 mg twice daily.  4.  Bextra, she takes 10 mg daily.  5.  Meloxicam 7.5 mg daily.  6.  Methotrexate 2.5 mg tablets, 7 tablets each week.  7.  Butalbital.  8.  Aspirin, frequency unknown.  9.  Effexor XR 75 mg daily.  10. Zyrtec 10 mg daily.  11. Detrol 4 mg daily.  12. Prednisone 10 mg daily.  13. Cipro, she has completed a week.  14. She takes Actonel 35 mg each week.   The pharmacy which she uses is [facility] on [address].   ALLERGIES:  She reports some allergy to FLAGYL.   SOCIAL HISTORY:  She is married, lives with her spouse.  She has 2 children.  She smokes less than a pack of cigarettes per day.  She denies alcohol use.  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   FAMILY HISTORY:  There is a positive history of coronary disease in the  family.   REVIEW OF SYSTEMS:  She had been in her usual state of health.  She has had  recent air travel but very brief in duration, only 55 minutes, in a travel  to [facility].  She has had no lower extremity injury.  She reports some  transient swelling of her lower extremities.   LABORATORY DATA:  None had been ordered in the emergency department.  I am  ordering a stat now.  We only have what is provided by her primary care  physician via fax, which only includes labs from August 11, 2005, and those  labs at that time revealed a urine positive for E. coli pansensitive and  concurrent bacteremia.  She had a CBC that revealed a WBC count of 15.1,  hemoglobin of 15.1, a platelet count of 330; however, this was a specimen  obtained August 07, 2005.  Her sodium  at that time was 140, potassium 4.1, BUN  15, creatinine 0.93, CO2 23.  Again, this was obtained on August 07, 2005.  We  have no recent data.  She did have an EKG in the emergency department that  revealed normal sinus rhythm and heart rate 74.   PHYSICAL EXAMINATION:  VITAL SIGNS:  Her blood pressure was 162/91,  respirations 20, heart rate 92, temperature 97.3.  GENERAL:  The patient is very pleasant, alert, oriented, in no acute  distress.  NECK:  Supple, nontender, no palpable thyromegaly or mass.  CARDIOVASCULAR:  Normal S1, S2.  LUNGS:  Clear to auscultation bilaterally, with normal effort, no dullness  to percussion.  ABDOMEN:  Soft and nontender, no hepatomegaly or splenomegaly.  Bowel sounds  were normoactive.  EXTREMITIES:  Lower extremity purpura and some mild calf tenderness.  She  had some small petechiae on her right upper extremity.  Otherwise there was  no evidence of rash elsewhere.   LABORATORY DATA:  As noted above, these are pending.   ASSESSMENT:  1.  Pyelonephritis felt to be due to Escherichia coli urinary tract      infection and bacteremia, status post completion of 7 days of therapy      with Cipro.  2.  Purpuric rash, uncertain of the etiology at this point.  She is on      medications that are known to cause thrombocytopenia, and we will hold      these at this time until further delineation can be arrived at after      laboratory data are results.   Therefore, at this time the plan is to hold her nabumetone as well as  hydroxychloroquine and Bextra.  Will continue her other medications,  including Effexor, prednisone and Detrol.  Will follow clinical course.  Of  course, further evaluations will be greatly dependent on the initial results  of initial screening laboratory studies drawn today.      Cyril Mourning, D.O.  Electronically Signed     ESS/MEDQ  D:  08/18/2005  T:  08/18/2005  Job:  [id number]  cc:   [name]. Baird Cancer, M.D.

## 2010-10-21 LAB — CSF CULTURE W GRAM STAIN: Gram Stain: NONE SEEN

## 2010-10-21 LAB — DIFFERENTIAL
Basophils Absolute: 0.1 10*3/uL (ref 0.0–0.1)
Basophils Relative: 1 % (ref 0–1)
Eosinophils Absolute: 0.1 10*3/uL (ref 0.0–0.7)
Monocytes Absolute: 1 10*3/uL (ref 0.1–1.0)
Monocytes Relative: 10 % (ref 3–12)
Neutro Abs: 7.3 10*3/uL (ref 1.7–7.7)
Neutrophils Relative %: 72 % (ref 43–77)

## 2010-10-21 LAB — CBC
MCHC: 32.9 g/dL (ref 30.0–36.0)
MCV: 89 fL (ref 78.0–100.0)
RBC: 5.08 MIL/uL (ref 3.87–5.11)
RDW: 14.2 % (ref 11.5–15.5)

## 2010-10-21 LAB — CSF CELL COUNT WITH DIFFERENTIAL
RBC Count, CSF: 20 /mm3 — ABNORMAL HIGH
Tube #: 4
WBC, CSF: 8 /mm3 — ABNORMAL HIGH (ref 0–5)

## 2010-10-21 LAB — BASIC METABOLIC PANEL
CO2: 27 mEq/L (ref 19–32)
Calcium: 9.8 mg/dL (ref 8.4–10.5)
Chloride: 105 mEq/L (ref 96–112)
Creatinine, Ser: 0.96 mg/dL (ref 0.4–1.2)
GFR calc Af Amer: >60 mL/min (ref >60)
Glucose, Bld: 92 mg/dL (ref 70–99)

## 2010-10-28 LAB — URINALYSIS, ROUTINE W REFLEX MICROSCOPIC
Bilirubin Urine: NEGATIVE
Hgb urine dipstick: NEGATIVE
Ketones, ur: NEGATIVE
Specific Gravity, Urine: 1.011
Urobilinogen, UA: 0.2

## 2010-10-28 LAB — CBC
HCT: 40.6
Hemoglobin: 13.8
MCHC: 34
MCV: 85
RBC: 4.78

## 2010-10-28 LAB — DIFFERENTIAL
Basophils Relative: 1
Eosinophils Absolute: 0.1
Eosinophils Relative: 1
Monocytes Absolute: 1.1 — ABNORMAL HIGH
Monocytes Relative: 10
Neutro Abs: 7.3

## 2010-10-28 LAB — BASIC METABOLIC PANEL
CO2: 25
Chloride: 107
GFR calc Af Amer: >60
Glucose, Bld: 70
Sodium: 138

## 2010-10-28 LAB — POCT CARDIAC MARKERS: Myoglobin, poc: 86.2

## 2010-12-13 ENCOUNTER — Other Ambulatory Visit: Payer: Medicare Other | Admitting: Lab

## 2010-12-28 ENCOUNTER — Telehealth: Payer: Self-pay | Admitting: Hematology and Oncology

## 2010-12-28 NOTE — Telephone Encounter (Signed)
S/w pt, gave appts 01/20/11 + 01/27/11.

## 2011-01-16 ENCOUNTER — Encounter: Payer: Self-pay | Admitting: Nurse Practitioner

## 2011-01-20 ENCOUNTER — Other Ambulatory Visit: Payer: Self-pay | Admitting: Hematology and Oncology

## 2011-01-20 ENCOUNTER — Other Ambulatory Visit (HOSPITAL_BASED_OUTPATIENT_CLINIC_OR_DEPARTMENT_OTHER): Payer: Medicare Other

## 2011-01-20 DIAGNOSIS — D472 Monoclonal gammopathy: Secondary | ICD-10-CM

## 2011-01-20 LAB — CBC WITH DIFFERENTIAL/PLATELET
Eosinophils Absolute: 0.1 10*3/uL (ref 0.0–0.5)
LYMPH%: 18.1 % (ref 14.0–49.7)
MONO#: 0.7 10*3/uL (ref 0.1–0.9)
NEUT#: 6.7 10*3/uL — ABNORMAL HIGH (ref 1.5–6.5)
Platelets: 272 10*3/uL (ref 145–400)
RBC: 4.92 10*6/uL (ref 3.70–5.45)
RDW: 14 % (ref 11.2–14.5)
WBC: 9.3 10*3/uL (ref 3.9–10.3)

## 2011-01-24 LAB — COMPREHENSIVE METABOLIC PANEL
Albumin: 4.1 g/dL (ref 3.5–5.2)
CO2: 22 mEq/L (ref 19–32)
Calcium: 10.2 mg/dL (ref 8.4–10.5)
Glucose, Bld: 77 mg/dL (ref 70–99)
Potassium: 4.2 mEq/L (ref 3.5–5.3)
Sodium: 141 mEq/L (ref 135–145)
Total Protein: 7.2 g/dL (ref 6.0–8.3)

## 2011-01-24 LAB — KAPPA/LAMBDA LIGHT CHAINS
Kappa free light chain: 2.84 mg/dL — ABNORMAL HIGH (ref 0.33–1.94)
Lambda Free Lght Chn: 2.85 mg/dL — ABNORMAL HIGH (ref 0.57–2.63)

## 2011-01-24 LAB — SPEP & IFE WITH QIG
Albumin ELP: 52.3 % — ABNORMAL LOW (ref 55.8–66.1)
Alpha-1-Globulin: 4.2 % (ref 2.9–4.9)
Beta 2: 5.7 % (ref 3.2–6.5)
Beta Globulin: 5.7 % (ref 4.7–7.2)

## 2011-01-27 ENCOUNTER — Ambulatory Visit: Payer: Medicare Other | Admitting: Hematology and Oncology

## 2013-07-17 ENCOUNTER — Other Ambulatory Visit: Payer: Self-pay | Admitting: Nurse Practitioner

## 2013-07-17 DIAGNOSIS — I739 Peripheral vascular disease, unspecified: Secondary | ICD-10-CM

## 2013-07-24 ENCOUNTER — Ambulatory Visit
Admission: RE | Admit: 2013-07-24 | Discharge: 2013-07-24 | Disposition: A | Discharge status: Home / Self Care | Payer: Medicare Other | Source: Ambulatory Visit | Attending: Nurse Practitioner | Admitting: Nurse Practitioner

## 2013-07-24 DIAGNOSIS — I739 Peripheral vascular disease, unspecified: Secondary | ICD-10-CM

## 2013-07-29 ENCOUNTER — Ambulatory Visit (INDEPENDENT_AMBULATORY_CARE_PROVIDER_SITE_OTHER): Payer: Medicare Other

## 2013-07-29 ENCOUNTER — Ambulatory Visit (INDEPENDENT_AMBULATORY_CARE_PROVIDER_SITE_OTHER): Payer: Medicare Other | Admitting: Internal Medicine

## 2013-07-29 VITALS — BP 142/100 | HR 120 | Temp 99.1°F | Resp 20 | Ht 61.25 in | Wt 127.4 lb

## 2013-07-29 DIAGNOSIS — G893 Neoplasm related pain (acute) (chronic): Secondary | ICD-10-CM

## 2013-07-29 DIAGNOSIS — G8929 Other chronic pain: Secondary | ICD-10-CM

## 2013-07-29 DIAGNOSIS — M069 Rheumatoid arthritis, unspecified: Secondary | ICD-10-CM

## 2013-07-29 DIAGNOSIS — C9002 Multiple myeloma in relapse: Secondary | ICD-10-CM

## 2013-07-29 DIAGNOSIS — M25562 Pain in left knee: Principal | ICD-10-CM

## 2013-07-29 DIAGNOSIS — M949 Disorder of cartilage, unspecified: Secondary | ICD-10-CM

## 2013-07-29 DIAGNOSIS — M25569 Pain in unspecified knee: Secondary | ICD-10-CM

## 2013-07-29 DIAGNOSIS — M898X9 Other specified disorders of bone, unspecified site: Secondary | ICD-10-CM

## 2013-07-29 DIAGNOSIS — M899 Disorder of bone, unspecified: Secondary | ICD-10-CM

## 2013-07-29 MED ORDER — HYDROCODONE-ACETAMINOPHEN 5-325 MG PO TABS: 1.0000 | ORAL_TABLET | Freq: Four times a day (QID) | ORAL | Status: DC | PRN

## 2013-07-29 MED ORDER — PREDNISONE 5 MG PO TABS: 5.0000 mg | ORAL_TABLET | Freq: Two times a day (BID) | ORAL | Status: DC

## 2013-07-29 MED ORDER — LIDOCAINE 5 % EX PTCH: 1.0000 | MEDICATED_PATCH | CUTANEOUS | Status: DC

## 2013-07-29 NOTE — Progress Notes (Signed)
   Subjective:    Patient ID: [name], female    DOB: May 20, 1940, 73 y.o.   MRN: [id number]  HPI CO left knee pain keeping her awake, has had RA for years and suffered a lot. Had blood work this week with her primary care and has Dr. Patrecia Pour for rheumatology and used to be on remacaid and it worked but cannot afford it. Both knees are deformed and swollen. Also hx of myeloma  Review of Systems     Objective:   Physical Exam  Constitutional: She is oriented to person, place, and time. She appears well-nourished. She appears distressed.  HENT:  Head: Normocephalic.  Eyes: EOM are normal. Pupils are equal, round, and reactive to light. No scleral icterus.  Neck: Normal range of motion.  Pulmonary/Chest: Effort normal.  Musculoskeletal: She exhibits edema and tenderness.       Left knee: She exhibits decreased range of motion, swelling, effusion, deformity, abnormal alignment, abnormal patellar mobility and bony tenderness. She exhibits no erythema.  Neurological: She is alert and oriented to person, place, and time. She exhibits normal muscle tone. Coordination abnormal.  Skin: No rash noted. No erythema.  Psychiatric: She has a normal mood and affect.    [facility] reading (PRIMARY) by  Dr.Giannamarie Paulus posterior distal femur has lytic lesion.        Assessment & Plan:  Pain left knee posterior and anterior RA and MM chronic pain Prednisone 5mg  bid for 10 d pc prn Vicodin 5/325 tid pc prn

## 2013-07-29 NOTE — Patient Instructions (Signed)

## 2013-08-01 ENCOUNTER — Telehealth: Payer: Self-pay

## 2013-08-01 NOTE — Telephone Encounter (Signed)
Pt sis needing pre authorization before her script for a knee pad can be filled, please advise her husband [name]

## 2013-08-04 NOTE — Telephone Encounter (Signed)
Spoke to brother and advised him to go ahead and drop off the prescription to the pharmacy. We will received the notification from them to start the PA.

## 2013-08-04 NOTE — Telephone Encounter (Signed)
LM for rtn call- which knee pad is he referring too. If this is in regards to the Lidocaine patches or something different?

## 2013-08-05 ENCOUNTER — Telehealth: Payer: Self-pay

## 2013-08-05 NOTE — Telephone Encounter (Signed)
PATIENT'S HUSBAND (JOHN Sprankle) CALLED TO SAY THAT DR. Elder Cyphers PRESCRIBED HIS WIFE SOME KIND OF PATCH FOR HER RHEUMATOID ARTHRITIS ABOUT A WEEK AGO. WHEN HE WENT TO PICK IT UP AT [facility], THEY TOLD HIM THAT THEY HAVE FAXED THE FORMS OVER FOR [place] TO GET IT AUTHORIZED 3 DIFFERENT TIMES AND WE HAVE NOT RESPONDED BACK. THEY HAVE [employer] INS. HE WOULD LIKE TO KNOW WHEN THIS CAN BE DONE? BEST PHONE [phone] ([place])  [address].  [place]

## 2013-08-05 NOTE — Telephone Encounter (Signed)
PA completed on covermymeds, pending decision.

## 2013-08-05 NOTE — Telephone Encounter (Signed)
PA sent to covermymeds today. Pt's husband notified.

## 2013-08-07 NOTE — Telephone Encounter (Signed)
PA denied bc use for pt's Dx is not FDA approved. Notified pt's husband. Dr Elder Cyphers, do you want to try anything else for pt, either oral or Voltaren gel?

## 2013-08-11 NOTE — Telephone Encounter (Signed)
Advised pt's husband and he agreed to check w/rheumatologist.

## 2013-08-11 NOTE — Telephone Encounter (Signed)
Her primary care and rheumatologist should have ideas to help her control the pain

## 2014-01-12 ENCOUNTER — Ambulatory Visit (HOSPITAL_COMMUNITY): Payer: Medicare Other

## 2014-01-12 ENCOUNTER — Encounter (HOSPITAL_COMMUNITY): Payer: Self-pay | Admitting: *Deleted

## 2014-01-12 ENCOUNTER — Inpatient Hospital Stay (HOSPITAL_COMMUNITY): Payer: Medicare Other

## 2014-01-12 ENCOUNTER — Inpatient Hospital Stay (HOSPITAL_COMMUNITY)
Admission: EM | Admit: 2014-01-12 | Discharge: 2014-01-14 | DRG: 684 | Disposition: A | Discharge status: Skilled Nursing Facility | Payer: Medicare Other | Source: Other Acute Inpatient Hospital | Attending: Internal Medicine | Admitting: Internal Medicine

## 2014-01-12 DIAGNOSIS — Z79899 Other long term (current) drug therapy: Secondary | ICD-10-CM | POA: Diagnosis not present

## 2014-01-12 DIAGNOSIS — N179 Acute kidney failure, unspecified: Secondary | ICD-10-CM

## 2014-01-12 DIAGNOSIS — R4020 Unspecified coma: Secondary | ICD-10-CM

## 2014-01-12 DIAGNOSIS — M069 Rheumatoid arthritis, unspecified: Secondary | ICD-10-CM

## 2014-01-12 DIAGNOSIS — I129 Hypertensive chronic kidney disease with stage 1 through stage 4 chronic kidney disease, or unspecified chronic kidney disease: Principal | ICD-10-CM | POA: Diagnosis present

## 2014-01-12 DIAGNOSIS — F17210 Nicotine dependence, cigarettes, uncomplicated: Secondary | ICD-10-CM | POA: Diagnosis present

## 2014-01-12 DIAGNOSIS — N183 Chronic kidney disease, stage 3 (moderate): Secondary | ICD-10-CM | POA: Diagnosis present

## 2014-01-12 DIAGNOSIS — M25562 Pain in left knee: Secondary | ICD-10-CM

## 2014-01-12 DIAGNOSIS — F32A Depression, unspecified: Secondary | ICD-10-CM | POA: Diagnosis present

## 2014-01-12 DIAGNOSIS — G893 Neoplasm related pain (acute) (chronic): Secondary | ICD-10-CM

## 2014-01-12 DIAGNOSIS — Z9119 Patient's noncompliance with other medical treatment and regimen: Secondary | ICD-10-CM | POA: Diagnosis present

## 2014-01-12 DIAGNOSIS — I10 Essential (primary) hypertension: Secondary | ICD-10-CM

## 2014-01-12 DIAGNOSIS — Z7982 Long term (current) use of aspirin: Secondary | ICD-10-CM | POA: Diagnosis not present

## 2014-01-12 DIAGNOSIS — E785 Hyperlipidemia, unspecified: Secondary | ICD-10-CM | POA: Diagnosis present

## 2014-01-12 DIAGNOSIS — G8929 Other chronic pain: Secondary | ICD-10-CM

## 2014-01-12 DIAGNOSIS — R55 Syncope and collapse: Secondary | ICD-10-CM | POA: Diagnosis not present

## 2014-01-12 DIAGNOSIS — F329 Major depressive disorder, single episode, unspecified: Secondary | ICD-10-CM | POA: Diagnosis present

## 2014-01-12 DIAGNOSIS — D72829 Elevated white blood cell count, unspecified: Secondary | ICD-10-CM | POA: Diagnosis present

## 2014-01-12 DIAGNOSIS — C9002 Multiple myeloma in relapse: Secondary | ICD-10-CM

## 2014-01-12 DIAGNOSIS — M898X9 Other specified disorders of bone, unspecified site: Secondary | ICD-10-CM

## 2014-01-12 DIAGNOSIS — E780 Pure hypercholesterolemia: Secondary | ICD-10-CM | POA: Diagnosis present

## 2014-01-12 DIAGNOSIS — R531 Weakness: Secondary | ICD-10-CM

## 2014-01-12 DIAGNOSIS — R569 Unspecified convulsions: Secondary | ICD-10-CM

## 2014-01-12 DIAGNOSIS — I160 Hypertensive urgency: Secondary | ICD-10-CM | POA: Diagnosis present

## 2014-01-12 LAB — COMPREHENSIVE METABOLIC PANEL
ALBUMIN: 3.8 g/dL (ref 3.5–5.2)
ALK PHOS: 105 U/L (ref 39–117)
ALT: 8 U/L (ref 0–35)
ANION GAP: 16 — AB (ref 5–15)
AST: 20 U/L (ref 0–37)
BILIRUBIN TOTAL: 0.8 mg/dL (ref 0.3–1.2)
BUN: 18 mg/dL (ref 6–23)
CHLORIDE: 107 meq/L (ref 96–112)
CO2: 19 mmol/L (ref 19–32)
Calcium: 10.9 mg/dL — ABNORMAL HIGH (ref 8.4–10.5)
Creatinine, Ser: 1.4 mg/dL — ABNORMAL HIGH (ref 0.50–1.10)
GFR calc Af Amer: 41 mL/min — ABNORMAL LOW (ref >90)
GFR calc non Af Amer: 36 mL/min — ABNORMAL LOW (ref >90)
Glucose, Bld: 149 mg/dL — ABNORMAL HIGH (ref 70–99)
Potassium: 3.5 mmol/L (ref 3.5–5.1)
SODIUM: 142 mmol/L (ref 135–145)
Total Protein: 7.8 g/dL (ref 6.0–8.3)

## 2014-01-12 LAB — TROPONIN I

## 2014-01-12 LAB — PROTIME-INR
INR: 1.05 (ref 0.00–1.49)
PROTHROMBIN TIME: 13.8 s (ref 11.6–15.2)

## 2014-01-12 LAB — I-STAT CHEM 8, ED
BUN: 23 mg/dL (ref 6–23)
CHLORIDE: 108 meq/L (ref 96–112)
Calcium, Ion: 1.28 mmol/L (ref 1.13–1.30)
Creatinine, Ser: 1.2 mg/dL — ABNORMAL HIGH (ref 0.50–1.10)
GLUCOSE: 146 mg/dL — AB (ref 70–99)
HCT: 49 % — ABNORMAL HIGH (ref 36.0–46.0)
Hemoglobin: 16.7 g/dL — ABNORMAL HIGH (ref 12.0–15.0)
Potassium: 3.7 mmol/L (ref 3.5–5.1)
Sodium: 142 mmol/L (ref 135–145)
TCO2: 20 mmol/L (ref 0–100)

## 2014-01-12 LAB — CBC WITH DIFFERENTIAL/PLATELET
Basophils Absolute: 0.1 10*3/uL (ref 0.0–0.1)
Basophils Relative: 1 % (ref 0–1)
Eosinophils Absolute: 0.1 10*3/uL (ref 0.0–0.7)
Eosinophils Relative: 1 % (ref 0–5)
HCT: 43.1 % (ref 36.0–46.0)
Hemoglobin: 14.3 g/dL (ref 12.0–15.0)
LYMPHS ABS: 3.8 10*3/uL (ref 0.7–4.0)
Lymphocytes Relative: 31 % (ref 12–46)
MCH: 28.6 pg (ref 26.0–34.0)
MCHC: 33.2 g/dL (ref 30.0–36.0)
MCV: 86.2 fL (ref 78.0–100.0)
MONOS PCT: 7 % (ref 3–12)
Monocytes Absolute: 0.8 10*3/uL (ref 0.1–1.0)
NEUTROS ABS: 7.6 10*3/uL (ref 1.7–7.7)
NEUTROS PCT: 62 % (ref 43–77)
Platelets: 343 10*3/uL (ref 150–400)
RBC: 5 MIL/uL (ref 3.87–5.11)
RDW: 14.8 % (ref 11.5–15.5)
WBC: 12.3 10*3/uL — ABNORMAL HIGH (ref 4.0–10.5)

## 2014-01-12 LAB — I-STAT CG4 LACTIC ACID, ED: Lactic Acid, Venous: 2.64 mmol/L — ABNORMAL HIGH (ref 0.5–2.2)

## 2014-01-12 LAB — I-STAT TROPONIN, ED: Troponin i, poc: 0 ng/mL (ref 0.00–0.08)

## 2014-01-12 LAB — URINALYSIS, ROUTINE W REFLEX MICROSCOPIC
Bilirubin Urine: NEGATIVE
Glucose, UA: NEGATIVE mg/dL
Hgb urine dipstick: NEGATIVE
Ketones, ur: NEGATIVE mg/dL
Leukocytes, UA: NEGATIVE
Nitrite: NEGATIVE
PROTEIN: NEGATIVE mg/dL
Specific Gravity, Urine: 1.008 (ref 1.005–1.030)
UROBILINOGEN UA: 1 mg/dL (ref 0.0–1.0)
pH: 6.5 (ref 5.0–8.0)

## 2014-01-12 SURGERY — Surgical Case
Anesthesia: *Unknown

## 2014-01-12 MED ORDER — SODIUM CHLORIDE 0.9 % IV BOLUS (SEPSIS)
1000.0000 mL | Freq: Once | INTRAVENOUS | Status: AC
  Administered 2014-01-12: 1000 mL via INTRAVENOUS

## 2014-01-12 MED ORDER — ONDANSETRON HCL 4 MG/2ML IJ SOLN
4.0000 mg | Freq: Once | INTRAMUSCULAR | Status: AC
  Administered 2014-01-12: 4 mg via INTRAVENOUS
  Filled 2014-01-12: qty 2

## 2014-01-12 MED ORDER — HYDRALAZINE HCL 20 MG/ML IJ SOLN
10.0000 mg | Freq: Four times a day (QID) | INTRAMUSCULAR | Status: DC | PRN
  Administered 2014-01-12: 10 mg via INTRAVENOUS
  Filled 2014-01-12: qty 1

## 2014-01-12 MED ORDER — HYDRALAZINE HCL 20 MG/ML IJ SOLN
5.0000 mg | Freq: Once | INTRAMUSCULAR | Status: AC
  Administered 2014-01-12: 5 mg via INTRAVENOUS
  Filled 2014-01-12: qty 1

## 2014-01-12 NOTE — Progress Notes (Signed)
Chaplain responded to code stemi. Code later cancelled. Chaplain introduced herself to pt husband and pt. Chaplain offered hospitality and prayer. Chaplain services not needed at this time. Page chaplain as needed.   01/12/14 2000  Clinical Encounter Type  Visited With Patient and family together  Visit Type ED;Spiritual support  Referral From Nurse  Spiritual Encounters  Spiritual Needs Emotional  Larey Dresser Epifanio Lesches 01/12/2014 8:28 PM

## 2014-01-12 NOTE — H&P (Addendum)
[facility] History and Physical  TAWYNA BETTERS [id number] DOB: 09-02-1940 DOA: 01/12/2014  Referring physician: Dr. Wyvonnia Dusky PCP: Maximino Greenland, MD   Chief Complaint:  Brought in by EMS for acute syncope  HPI:  73 year old female with history of rheumatoid arthritis, back pain, hypertension (noncompliant to medications as she feels it makes her sick), depression was brought in by EMS after she had an acute syncopal episode. History provided partly by the patient and by her husband at bedside. Patient reports having throbbing headache all day which is unusual for her. Her husband returned from work around 4:30pm  and patient was complaining of headaches. She sat down to eat supper with him and after taking a bite she complained of feeling dizzy and then slumped on the side of the chair and passed out. She then had 2 episodes of vomiting but was unresponsive. Husband reports that she was unresponsive for almost 15 minutes. When EMS arrived she was arousable and an EKG done on site showed ST elevation in V2-V3 and was brought in to the ED as code STEMI. As per husband patient had some tremors in her hand and had urinary incontinence during that event. Patient was awake but drowsy upon arrival to the ED. She was hypertensive with systolic blood pressure in 200s. She reported minimal headache but no further dizziness, blurred vision, weakness or numbness of her extremities. She denied any visual symptoms or weakness during the episode. She does not recall the event. She denies any history of seizures or stroke in the past. He reports remote migraine symptoms but no similar headaches in the past. She denies fever, chills, chest pain, palpitations, SOB, abdominal pain, bowel  symptoms. Denies dysuria or hematuria. Denies change in weight or appetite. She reports that she is not compliant with her blood pressure medications and makes her sick.  Course in the ED Patient had low-grade fever of  99.21F, heart rate ranged from 80-106, blood pressure of 206/88 mmHg and normal O2 sat on room air. Blood work showed a release of 12.3, normal hemoglobin and platelets, chemistry showed normal sodium, potassium of 3.5, chloride 107, CO2 of 19 with anion gap of 16, creatinine was 1.4 calcium of 10.9, EKG showed J-point elevation in V2-V3 which was reviewed by cardiologist and given absence of chest pain symptoms code STEMI was canceled. Calcium level was low at 10.9. Glucose was 149. Head CT was done which was unremarkable. Hospitalist admission requested to telemetry. Patient was given 5 mg IV hydralazine.  Review of Systems:  Constitutional: Denies fever, chills, diaphoresis, appetite change and fatigue.  HEENT: Denies photophobia, eye pain, redness, hearing loss, ear pain, congestion, sore throat, rhinorrhea, trouble swallowing, neck pain, neck stiffness and tinnitus.   Respiratory: Denies SOB, DOE, cough, chest tightness,  and wheezing.   Cardiovascular: Denies chest pain, palpitations and leg swelling.  Gastrointestinal:  nausea, vomiting, denies abdominal pain, diarrhea, constipation, blood in stool and abdominal distention.  Genitourinary: Denies dysuria, urgency, frequency, hematuria, flank pain and difficulty urinating.  Endocrine: Denies: hot or cold intolerance, sweats, polyuria, polydipsia. Musculoskeletal: Back pain, hands and knee joint pains, Denies gait problem  Skin: Denies  rash and wound.  Neurological: Syncope, weakness, dizziness , ? Seizures and headache. Denies lightheadedness Psychiatric/Behavioral: Denies mood changes, confusion,    Past Medical History  Diagnosis Date  . Hypertension   . Rheumatoid arthritis(714.0)   . Hypercholesterolemia   . Depression    Past Surgical History  Procedure Laterality Date  . Abdominal hysterectomy    .  Bilateral carpal tunnel repair    . Eye surgery     Social History:  reports that she has been smoking Cigarettes.  She has a  60 pack-year smoking history. She has never used smokeless tobacco. She reports that she does not drink alcohol or use illicit drugs.  No Known Allergies  Family History  Problem Relation Age of Onset  . Cancer Father   . Cancer Brother   . Heart disease Brother   . Miscarriages / [place] Brother   . Heart disease Mother     Prior to Admission medications   Medication Sig Start Date End Date Taking? Authorizing Provider  aspirin 81 MG tablet Take 81 mg by mouth daily.     Yes Historical Provider, MD  atorvastatin (LIPITOR) 20 MG tablet Take 20 mg by mouth daily.   Yes Historical Provider, MD  baclofen (LIORESAL) 10 MG tablet Take 10 mg by mouth 3 (three) times daily as needed for muscle spasms.   Yes Historical Provider, MD  buPROPion (WELLBUTRIN SR) 150 MG 12 hr tablet Take 150 mg by mouth 2 (two) times daily.    Yes Historical Provider, MD  Cholecalciferol (VITAMIN D3) 50000 UNITS CAPS Take 1 capsule by mouth once a week. Take on Sunday morning   Yes Historical Provider, MD  HYDROcodone-acetaminophen (NORCO/VICODIN) 5-325 MG per tablet Take 1 tablet by mouth every 6 (six) hours as needed. Patient taking differently: Take 1 tablet by mouth every 6 (six) hours as needed for moderate pain.  07/29/13  Yes Orma Flaming, MD  hydroxychloroquine (PLAQUENIL) 200 MG tablet Take 100-200 mg by mouth daily. Take a whole tablet in the morning and half tablet in the evening   Yes Historical Provider, MD  pravastatin (PRAVACHOL) 40 MG tablet Take 40 mg by mouth every evening.   Yes Historical Provider, MD  predniSONE (STERAPRED UNI-PAK) 5 MG TABS tablet Take 5 mg by mouth See admin instructions. Take 4 tablets every morning X4 days, Then 3 tabs daily for 4 days, then 2 tabs daily for 4 days, then 1 tab for 4 days, then 1/2 tab for 4 days, then discontinue. Unknown start date. There are 21 pills left in the bottle as of 01-12-14   Yes Historical Provider, MD  sulfaSALAzine (AZULFIDINE) 500 MG tablet  Take 500 mg by mouth 2 (two) times daily.   Yes Historical Provider, MD  valsartan-hydrochlorothiazide (DIOVAN-HCT) 160-25 MG per tablet Take 1 tablet by mouth daily.     Yes Historical Provider, MD  atorvastatin (LIPITOR) 10 MG tablet Take 10 mg by mouth daily.      Historical Provider, MD  lidocaine (LIDODERM) 5 % Place 1 patch onto the skin daily. Remove & Discard patch within 12 hours or as directed by MD Patient not taking: Reported on 01/12/2014 07/29/13   Orma Flaming, MD  predniSONE (DELTASONE) 5 MG tablet Take 1 tablet (5 mg total) by mouth 2 (two) times daily with a meal. Patient not taking: Reported on 01/12/2014 07/29/13   Orma Flaming, MD     Physical Exam:  Filed Vitals:   01/12/14 2230 01/12/14 2245 01/12/14 2300 01/12/14 2305  BP: 200/164 193/88 199/94   Pulse: 88 89 95   Temp:    99.4 F (37.4 C)  TempSrc:      Resp: 21 18 26    Height:      Weight:      SpO2: 100% 100% 100%     Constitutional: Vital signs reviewed.  Elderly female in no acute distress, appears drowsy HEENT: no pallor, no icterus, moist oral mucosa, no cervical lymphadenopathy Cardiovascular: RRR, S1 normal, S2 normal, no MRG Chest: CTAB, no wheezes, rales, or rhonchi Abdominal: Soft. Non-tender, non-distended, bowel sounds are normal, Ext: warm, no edema Neurological: Awake but drowsy, oriented . Cranial nerves II-12 intact, normal power and tone  in all extremities, normal sensation, no neck rigidity  Labs on Admission:  Basic Metabolic Panel:  Recent Labs Lab 01/12/14 2015 01/12/14 2031  NA 142 142  K 3.5 3.7  CL 107 108  CO2 19  --   GLUCOSE 149* 146*  BUN 18 23  CREATININE 1.40* 1.20*  CALCIUM 10.9*  --    Liver Function Tests:  Recent Labs Lab 01/12/14 2015  AST 20  ALT 8  ALKPHOS 105  BILITOT 0.8  PROT 7.8  ALBUMIN 3.8   No results for input(s): LIPASE, AMYLASE in the last 168 hours. No results for input(s): AMMONIA in the last 168 hours. CBC:  Recent Labs Lab  01/12/14 2015 01/12/14 2031  WBC 12.3*  --   NEUTROABS 7.6  --   HGB 14.3 16.7*  HCT 43.1 49.0*  MCV 86.2  --   PLT 343  --    Cardiac Enzymes:  Recent Labs Lab 01/12/14 2015  TROPONINI <0.03   BNP: Invalid input(s): POCBNP CBG: No results for input(s): GLUCAP in the last 168 hours.  Radiological Exams on Admission: Ct Head Wo Contrast  01/12/2014   CLINICAL DATA:  Headache, nausea and vomiting.  EXAM: CT HEAD WITHOUT CONTRAST  TECHNIQUE: Contiguous axial images were obtained from the base of the skull through the vertex without intravenous contrast.  COMPARISON:  Brain CT 04/14/2008  FINDINGS: Periventricular and subcortical white matter hypodensity compatible with chronic small vessel ischemic change. Ventricles and sulci are prominent compatible with atrophy. No evidence for acute cortically based infarct, intracranial hemorrhage, mass lesion or mass effect. Orbits are unremarkable. Mastoid air cells are well aerated. Paranasal sinuses are unremarkable. Calvarium is intact.  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  No acute intracranial process.   Electronically Signed   By: Lovey Newcomer M.D.   On: 01/12/2014 21:14   Dg Chest Portable 1 View  01/12/2014   CLINICAL DATA:  Weakness.  EXAM: PORTABLE CHEST - 1 VIEW  COMPARISON:  12/20/2009  FINDINGS: Normal heart size and mediastinal contours. No acute infiltrate or edema. Skin fold noted at the right apex ; no pneumothorax. No pleural effusion. No acute osseous findings.  IMPRESSION: No active disease.   Electronically Signed   By: Jorje Guild M.D.   On: 01/12/2014 21:23    EKG: Sinus tachycardia at 103, J-point elevation in V2-V3, no old EKG to compare  Assessment/Plan  Principal Problem:   Syncope and collapse Differential includes malignant hypertensive encephalopathy versus seizures versus stroke Admit to telemetry Neurochecks every 4 hours, nothing by mouth until cleared by bedside swallow  evaluation. -Head CT in the ED unremarkable. Will obtain MRI of the brain and EEG. Place on IV Ativan 1 mg when necessary for seizures. -Maintain seizure precautions. -When necessary hydralazine for elevated blood pressure. Will avoid rapid reduction in blood pressure. -Hold baclofen which can lower seizure threshold. Avoid narcotics for now. -Consulted neuro hospitalist ( Dr Nicole Kindred)  with concern for seizures and will see the patient.  Active Problems: Hypertensive urgency Patient reported being noncompliant to her medications. Hold HCTZ and valsartan given mild acute kidney injury. Place on  when necessary hydralazine with close monitoring of blood pressure. Check 2-D echo.  EKG changes Noted for J-point elevation in V2-V3. No prior EKG in the system. Follow serial troponins. Check 2-D echo. Replenish potassium. Check magnesium level    Rheumatoid arthritis Resume home medications except for narcotics. Please verify use of chronic prednisone with pharmacy  Acute kidney injury Mild. Hold HCTZ and valsartan. Monitor with gentle hydration.    Depression Continue Zoloft    Hyperlipidemia Continue statin  Leukocytosis Likely associated with acute stress and being on prednisone recently. No signs of infection. Chest x-ray and UA unremarkable.   Tobacco abuse Counseled on cessation.       Diet: Nothing by mouth until cleared by bedside swallow and more awake and oriented.  DVT prophylaxis: sq heparin   Code Status: Full code Family Communication: discussed with husband at bedside Disposition Plan: Admit to telemetry  [place], [facility] Pager [phone]  Total time spent on admission :70 minutes  If 7PM-7AM, please contact night-coverage [email or web address] Password [email or web address] 01/12/2014, 11:11 PM

## 2014-01-12 NOTE — ED Provider Notes (Signed)
CSN: [id number]     Arrival date & time 01/12/14  2007 History   None    Chief Complaint  Patient presents with  . Loss of Consciousness     (Consider location/radiation/quality/duration/timing/severity/associated sxs/prior Treatment) HPI Comments: Patient brought in by EMS with episode of syncope while sitting at the dinner table. She had one episode of vomiting prior to this. Also consciousness for a reported 15 minutes with slow to respond. Now awake and alert. Endorses generalized weakness. Denies any headache, chest pain or shortness of breath. EMS called code STEMI due to J-point elevation in V2 and V3. Patient denies ever having chest pain. No previous cardiac history. No recent fevers or illnesses. No diarrhea. Patient was reportedly complaining of a headache all day.  The history is provided by the patient and the EMS personnel. The history is limited by the condition of the patient.    Past Medical History  Diagnosis Date  . Hypertension   . Rheumatoid arthritis(714.0)   . Hypercholesterolemia   . Depression    Past Surgical History  Procedure Laterality Date  . Abdominal hysterectomy    . Bilateral carpal tunnel repair    . Eye surgery     Family History  Problem Relation Age of Onset  . Cancer Father   . Cancer Brother   . Heart disease Brother   . Miscarriages / [place] Brother   . Heart disease Mother    History  Substance Use Topics  . Smoking status: Current Every Day Smoker -- 1.00 packs/day for 60 years    Types: Cigarettes  . Smokeless tobacco: Never Used  . Alcohol Use: No   OB History    No data available     Review of Systems  Constitutional: Positive for activity change, appetite change and fatigue. Negative for fever.  Eyes: Negative for visual disturbance.  Respiratory: Negative for cough, chest tightness and shortness of breath.   Gastrointestinal: Positive for nausea. Negative for abdominal pain.  Genitourinary: Negative for vaginal  bleeding and vaginal discharge.  Musculoskeletal: Positive for myalgias and arthralgias. Negative for back pain and neck pain.  Skin: Negative for rash.  Neurological: Positive for weakness. Negative for dizziness.  A complete 10 system review of systems was obtained and all systems are negative except as noted in the HPI and PMH.      Allergies  Review of patient's allergies indicates no known allergies.  Home Medications   Prior to Admission medications   Medication Sig Start Date End Date Taking? Authorizing Provider  aspirin 81 MG tablet Take 81 mg by mouth daily.     Yes Historical Provider, MD  atorvastatin (LIPITOR) 20 MG tablet Take 20 mg by mouth daily.   Yes Historical Provider, MD  baclofen (LIORESAL) 10 MG tablet Take 10 mg by mouth 3 (three) times daily as needed for muscle spasms.   Yes Historical Provider, MD  buPROPion (WELLBUTRIN SR) 150 MG 12 hr tablet Take 150 mg by mouth 2 (two) times daily.    Yes Historical Provider, MD  Cholecalciferol (VITAMIN D3) 50000 UNITS CAPS Take 1 capsule by mouth once a week. Take on Sunday morning   Yes Historical Provider, MD  HYDROcodone-acetaminophen (NORCO/VICODIN) 5-325 MG per tablet Take 1 tablet by mouth every 6 (six) hours as needed. Patient taking differently: Take 1 tablet by mouth every 6 (six) hours as needed for moderate pain.  07/29/13  Yes Orma Flaming, MD  hydroxychloroquine (PLAQUENIL) 200 MG tablet Take 100-200 mg  by mouth daily. Take a whole tablet in the morning and half tablet in the evening   Yes Historical Provider, MD  pravastatin (PRAVACHOL) 40 MG tablet Take 40 mg by mouth every evening.   Yes Historical Provider, MD  predniSONE (STERAPRED UNI-PAK) 5 MG TABS tablet Take 5 mg by mouth See admin instructions. Take 4 tablets every morning X4 days, Then 3 tabs daily for 4 days, then 2 tabs daily for 4 days, then 1 tab for 4 days, then 1/2 tab for 4 days, then discontinue. Unknown start date. There are 21 pills left in  the bottle as of 01-12-14   Yes Historical Provider, MD  sulfaSALAzine (AZULFIDINE) 500 MG tablet Take 500 mg by mouth 2 (two) times daily.   Yes Historical Provider, MD  valsartan-hydrochlorothiazide (DIOVAN-HCT) 160-25 MG per tablet Take 1 tablet by mouth daily.     Yes Historical Provider, MD  atorvastatin (LIPITOR) 10 MG tablet Take 10 mg by mouth daily.      Historical Provider, MD  lidocaine (LIDODERM) 5 % Place 1 patch onto the skin daily. Remove & Discard patch within 12 hours or as directed by MD Patient not taking: Reported on 01/12/2014 07/29/13   Orma Flaming, MD  predniSONE (DELTASONE) 5 MG tablet Take 1 tablet (5 mg total) by mouth 2 (two) times daily with a meal. Patient not taking: Reported on 01/12/2014 07/29/13   Benn Moulder Guest, MD   BP 143/69 mmHg  Pulse 119  Temp(Src) 99.4 F (37.4 C) (Oral)  Resp 24  Ht 5\' 6"  (1.676 m)  Wt 142 lb (64.411 kg)  BMI 22.93 kg/m2  SpO2 97% Physical Exam  Constitutional: She is oriented to person, place, and time. She appears well-developed and well-nourished. No distress.  Incontinent of stool Drowsy but arousable.  HENT:  Head: Normocephalic and atraumatic.  Mouth/Throat: Oropharynx is clear and moist. No oropharyngeal exudate.  Eyes: Conjunctivae and EOM are normal. Pupils are equal, round, and reactive to light.  Neck: Normal range of motion. Neck supple.  No meningismus.  Cardiovascular: Normal rate, regular rhythm, normal heart sounds and intact distal pulses.   No murmur heard. Pulmonary/Chest: Effort normal and breath sounds normal. No respiratory distress.  Abdominal: Soft. There is no tenderness. There is no rebound and no guarding.  Musculoskeletal: Normal range of motion. She exhibits no edema or tenderness.  Neurological: She is alert and oriented to person, place, and time. No cranial nerve deficit. She exhibits normal muscle tone. Coordination normal.  CN 2-12 intact, moving all extremities  Skin: Skin is warm.   Psychiatric: She has a normal mood and affect. Her behavior is normal.  Nursing note and vitals reviewed.   ED Course  Procedures (including critical care time) Labs Review Labs Reviewed  CBC WITH DIFFERENTIAL - Abnormal; Notable for the following:    WBC 12.3 (*)    All other components within normal limits  COMPREHENSIVE METABOLIC PANEL - Abnormal; Notable for the following:    Glucose, Bld 149 (*)    Creatinine, Ser 1.40 (*)    Calcium 10.9 (*)    GFR calc non Af Amer 36 (*)    GFR calc Af Amer 41 (*)    Anion gap 16 (*)    All other components within normal limits  I-STAT CG4 LACTIC ACID, ED - Abnormal; Notable for the following:    Lactic Acid, Venous 2.64 (*)    All other components within normal limits  I-STAT CHEM 8, ED -  Abnormal; Notable for the following:    Creatinine, Ser 1.20 (*)    Glucose, Bld 146 (*)    Hemoglobin 16.7 (*)    HCT 49.0 (*)    All other components within normal limits  TROPONIN I  PROTIME-INR  URINALYSIS, ROUTINE W REFLEX MICROSCOPIC  TSH  MAGNESIUM  TROPONIN I  BASIC METABOLIC PANEL  TROPONIN I  TROPONIN I  I-STAT TROPOININ, ED    Imaging Review Ct Head Wo Contrast  01/12/2014   CLINICAL DATA:  Headache, nausea and vomiting.  EXAM: CT HEAD WITHOUT CONTRAST  TECHNIQUE: Contiguous axial images were obtained from the base of the skull through the vertex without intravenous contrast.  COMPARISON:  Brain CT 04/14/2008  FINDINGS: Periventricular and subcortical white matter hypodensity compatible with chronic small vessel ischemic change. Ventricles and sulci are prominent compatible with atrophy. No evidence for acute cortically based infarct, intracranial hemorrhage, mass lesion or mass effect. Orbits are unremarkable. Mastoid air cells are well aerated. Paranasal sinuses are unremarkable. Calvarium is intact.  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  No acute intracranial process.   Electronically Signed   By: Lovey Newcomer M.D.   On: 01/12/2014 21:14   Mr Brain Wo Contrast  01/13/2014   CLINICAL DATA:  Initial evaluation for headache for 1 day.  Syncope.  EXAM: MRI HEAD WITHOUT CONTRAST  TECHNIQUE: Multiplanar, multiecho pulse sequences of the brain and surrounding structures were obtained without intravenous contrast.  COMPARISON:  Prior CT from 01/12/2014.  FINDINGS: Study is degraded by motion artifact.  Diffuse prominence of the CSF containing spaces is compatible with generalized age-related cerebral atrophy. Patchy and confluent T2/FLAIR hyperintensity within the periventricular and deep white matter both cerebral hemispheres most consistent with chronic small vessel ischemic disease, moderate for patient age. Small vessel type changes present within the pons as well. Small remote lacunar infarct present within the basal ganglia and corona radiata bilaterally. Probable additional small infarct involving the anterior corpus callosum as well.  No abnormal foci of restricted diffusion to suggest acute intracranial infarct. Gray-white matter differentiation maintained. Normal intravascular flow voids present. No intracranial hemorrhage. Single small subcentimeter chronic microhemorrhage noted within the left temporal lobe.  No mass lesion or midline shift. Mild ventricular prominence with global parenchymal volume loss present without hydrocephalus. No extra-axial fluid collection.  Craniocervical junction within normal limits. Pituitary gland unremarkable.  Degenerative changes noted within the upper cervical spine. Bone marrow signal intensity within normal limits. No acute abnormality seen within the scalp soft tissues.  Orbits within normal limits.  Minimal opacity noted within the inferior aspect of the left maxillary sinus. Paranasal sinuses are otherwise clear. Trace fluid density noted within the inferior left mastoid air cells.  IMPRESSION: 1. No acute intracranial infarct or other abnormality identified. 2.  Age-related atrophy with moderate chronic small vessel ischemic disease.   Electronically Signed   By: Jeannine Boga M.D.   On: 01/13/2014 00:52   Dg Chest Portable 1 View  01/12/2014   CLINICAL DATA:  Weakness.  EXAM: PORTABLE CHEST - 1 VIEW  COMPARISON:  12/20/2009  FINDINGS: Normal heart size and mediastinal contours. No acute infiltrate or edema. Skin fold noted at the right apex ; no pneumothorax. No pleural effusion. No acute osseous findings.  IMPRESSION: No active disease.   Electronically Signed   By: Jorje Guild M.D.   On: 01/12/2014 21:23     EKG Interpretation   Date/Time:  Monday January 12 2014 20:20:16 EST Ventricular  Rate:  103 PR Interval:  127 QRS Duration: 77 QT Interval:  360 QTC Calculation: 471 R Axis:   48 Text Interpretation:  Sinus tachycardia Consider right atrial enlargement  J point elevation v2 and v3 Confirmed by Wyvonnia Dusky  MD, Khalfani Weideman [phone]) on  01/12/2014 8:37:30 PM      MDM   Final diagnoses:  Syncope, unspecified syncope type  Seizure   Episode of syncope and generalized weakness. One episode of vomiting prior to syncope. No chest pain.  EKG reviewed with Dr. Martinique at bedside. He agrees and absence of chest pain we'll cancel code STEMI.  CXR negative. UA negative. CT head negative.  Labs with stable elevated cr. BP elevated in ED, reportedly noncompliant with meds at home. Hydralazine given. Will not lower BP too quickly in setting of possible stroke  Probable syncopal episode, less likely seizure.  Patient sleepy but appropriate and denies complaint.  Admission d/w dR. Dhungel.   Ezequiel Essex, MD 01/13/14 [phone]

## 2014-01-12 NOTE — ED Notes (Signed)
Admitting MD at bedside.

## 2014-01-12 NOTE — ED Notes (Signed)
Patient transported to MRI 

## 2014-01-12 NOTE — ED Notes (Signed)
Per EMS: pt coming from home with c/o STEMI, syncope, weakness. Pt was eating dinner, had a witness syncope by family, pt vomited once prior to syncope, estimated LOC was 15 minutes, pt denies pain, pt reports weakness, and headache. Pt A&Ox4, respirations equal and unlabored, skin warm and dry. Pt was given nitro x 1 and 4 baby asa.

## 2014-01-12 NOTE — Consult Note (Signed)
Reason for Consult: Loss of consciousness of unclear etiology.   HPI:                                                                                                                                          [name] is an 73 y.o. female with a history of hypertension, rheumatoid arthritis, hypercholesterolemia and depression, brought to the emergency room following an episode of loss of consciousness at home. Patient complained of a headache and nausea and vomited twice prior to losing consciousness. She reportedly was unconscious for about 15 minutes and was confused on regaining consciousness. She reportedly had shaking of her right hand during the period of unconsciousness but no other motor activity. She was incontinent of urine during period of unconsciousness. He has not been compliant with taking antihypertensive medication and blood pressure was 206/118 in the emergency room. She was given IV hydralazine for management of hypertensive urgency. CT scan of her head showed no acute intracranial abnormality. Confusion resolved and headache improved.  Past Medical History  Diagnosis Date  . Hypertension   . Rheumatoid arthritis(714.0)   . Hypercholesterolemia   . Depression     Past Surgical History  Procedure Laterality Date  . Abdominal hysterectomy    . Bilateral carpal tunnel repair    . Eye surgery      Family History  Problem Relation Age of Onset  . Cancer Father   . Cancer Brother   . Heart disease Brother   . Miscarriages / [place] Brother   . Heart disease Mother     Social History:  reports that she has been smoking Cigarettes.  She has a 60 pack-year smoking history. She has never used smokeless tobacco. She reports that she does not drink alcohol or use illicit drugs.  No Known Allergies  MEDICATIONS:                                                                                                                     I have reviewed the patient's current  medications.   ROS:  History obtained from spouse and the patient  General ROS: negative for - chills, fatigue, fever, night sweats, weight gain or weight loss Psychological ROS: negative for - behavioral disorder, hallucinations, memory difficulties, mood swings or suicidal ideation Ophthalmic ROS: negative for - blurry vision, double vision, eye pain or loss of vision ENT ROS: negative for - epistaxis, nasal discharge, oral lesions, sore throat, tinnitus or vertigo Allergy and Immunology ROS: negative for - hives or itchy/watery eyes Hematological and Lymphatic ROS: negative for - bleeding problems, bruising or swollen lymph nodes Endocrine ROS: negative for - galactorrhea, hair pattern changes, polydipsia/polyuria or temperature intolerance Respiratory ROS: negative for - cough, hemoptysis, shortness of breath or wheezing Cardiovascular ROS: negative for - chest pain, dyspnea on exertion, edema or irregular heartbeat Gastrointestinal ROS: As noted in history of present illness Genito-Urinary ROS: As noted in history of present illness Musculoskeletal ROS: negative for - joint swelling or muscular weakness Neurological ROS: as noted in HPI Dermatological ROS: negative for rash and skin lesion changes   Blood pressure 199/94, pulse 95, temperature 99.4 F (37.4 C), temperature source Oral, resp. rate 26, height _0  (1.676 m), weight 64.411 kg (142 lb), SpO2 100 %. Appearance was that of an elderly appearing slender lady who was somewhat drowsy but in no acute distress. HEENT: no pallor, no icterus, moist oral mucosa, no cervical lymphadenopathy Cardiovascular: RRR, S1 normal, S2 normal, no murmur or gallop Chest: Clear, no wheezes, rales, or rhonchi Abdominal: Soft. Non-tender, non-distended, bowel sounds are normal, Ext: warm, no edema  Neurologic  Examination:                                                                                                      Mental Status: Alert, oriented, thought content appropriate.  Speech fluent without evidence of aphasia. Able to follow commands without difficulty. Cranial Nerves: II-Visual fields were normal. III/IV/VI-Pupils were equal and reacted. Extraocular movements were full and conjugate.    V/VII-no facial numbness and no facial weakness. VIII-normal. X-normal speech and symmetrical palatal movement. XII-midline tongue extension Motor: 5/5 bilaterally with normal tone and bulk Sensory: Normal throughout. Deep Tendon Reflexes: 2+ and symmetric. Plantars: Flexor bilaterally Cerebellar: Normal finger-to-nose testing. Carotid auscultation: Normal  No results found for: CHOL  Results for orders placed or performed during the hospital encounter of 01/12/14 (from the past 48 hour(s))  CBC with Differential     Status: Abnormal   Collection Time: 01/12/14  8:15 PM  Result Value Ref Range   WBC 12.3 (H) 4.0 - 10.5 K/uL   RBC 5.00 3.87 - 5.11 MIL/uL   Hemoglobin 14.3 12.0 - 15.0 g/dL   HCT 43.1 36.0 - 46.0 %   MCV 86.2 78.0 - 100.0 fL   MCH 28.6 26.0 - 34.0 pg   MCHC 33.2 30.0 - 36.0 g/dL   RDW 14.8 11.5 - 15.5 %   Platelets 343 150 - 400 K/uL   Neutrophils Relative % 62 43 - 77 %   Neutro Abs 7.6 1.7 - 7.7 K/uL   Lymphocytes Relative 31 12 - 46 %  Lymphs Abs 3.8 0.7 - 4.0 K/uL   Monocytes Relative 7 3 - 12 %   Monocytes Absolute 0.8 0.1 - 1.0 K/uL   Eosinophils Relative 1 0 - 5 %   Eosinophils Absolute 0.1 0.0 - 0.7 K/uL   Basophils Relative 1 0 - 1 %   Basophils Absolute 0.1 0.0 - 0.1 K/uL  Comprehensive metabolic panel     Status: Abnormal   Collection Time: 01/12/14  8:15 PM  Result Value Ref Range   Sodium 142 135 - 145 mmol/L    Comment: Please note change in reference range.   Potassium 3.5 3.5 - 5.1 mmol/L    Comment: Please note change in reference range.    Chloride 107 96 - 112 mEq/L   CO2 19 19 - 32 mmol/L   Glucose, Bld 149 (H) 70 - 99 mg/dL   BUN 18 6 - 23 mg/dL   Creatinine, Ser 1.40 (H) 0.50 - 1.10 mg/dL   Calcium 10.9 (H) 8.4 - 10.5 mg/dL   Total Protein 7.8 6.0 - 8.3 g/dL   Albumin 3.8 3.5 - 5.2 g/dL   AST 20 0 - 37 U/L   ALT 8 0 - 35 U/L   Alkaline Phosphatase 105 39 - 117 U/L   Total Bilirubin 0.8 0.3 - 1.2 mg/dL   GFR calc non Af Amer 36 (L) >90 mL/min   GFR calc Af Amer 41 (L) >90 mL/min    Comment: (NOTE) The eGFR has been calculated using the CKD EPI equation. This calculation has not been validated in all clinical situations. eGFR's persistently <90 mL/min signify possible Chronic Kidney Disease.    Anion gap 16 (H) 5 - 15  Troponin I     Status: None   Collection Time: 01/12/14  8:15 PM  Result Value Ref Range   Troponin I <0.03 <0.031 ng/mL    Comment:        NO INDICATION OF MYOCARDIAL INJURY. Please note change in reference range.   Protime-INR     Status: None   Collection Time: 01/12/14  8:15 PM  Result Value Ref Range   Prothrombin Time 13.8 11.6 - 15.2 seconds   INR 1.05 0.00 - 1.49  I-stat chem 8, ed     Status: Abnormal   Collection Time: 01/12/14  8:31 PM  Result Value Ref Range   Sodium 142 135 - 145 mmol/L   Potassium 3.7 3.5 - 5.1 mmol/L   Chloride 108 96 - 112 mEq/L   BUN 23 6 - 23 mg/dL   Creatinine, Ser 1.20 (H) 0.50 - 1.10 mg/dL   Glucose, Bld 146 (H) 70 - 99 mg/dL   Calcium, Ion 1.28 1.13 - 1.30 mmol/L   TCO2 20 0 - 100 mmol/L   Hemoglobin 16.7 (H) 12.0 - 15.0 g/dL   HCT 49.0 (H) 36.0 - 46.0 %  I-Stat CG4 Lactic Acid, ED     Status: Abnormal   Collection Time: 01/12/14  8:32 PM  Result Value Ref Range   Lactic Acid, Venous 2.64 (H) 0.5 - 2.2 mmol/L  I-stat troponin, ED     Status: None   Collection Time: 01/12/14  8:53 PM  Result Value Ref Range   Troponin i, poc 0.00 0.00 - 0.08 ng/mL   Comment 3            Comment: Due to the release kinetics of cTnI, a negative result within  the first hours of the onset of symptoms does not rule out myocardial  infarction with certainty. If myocardial infarction is still suspected, repeat the test at appropriate intervals.   Urinalysis, Routine w reflex microscopic     Status: None   Collection Time: 01/12/14 10:37 PM  Result Value Ref Range   Color, Urine YELLOW YELLOW   APPearance CLEAR CLEAR   Specific Gravity, Urine 1.008 1.005 - 1.030   pH 6.5 5.0 - 8.0   Glucose, UA NEGATIVE NEGATIVE mg/dL   Hgb urine dipstick NEGATIVE NEGATIVE   Bilirubin Urine NEGATIVE NEGATIVE   Ketones, ur NEGATIVE NEGATIVE mg/dL   Protein, ur NEGATIVE NEGATIVE mg/dL   Urobilinogen, UA 1.0 0.0 - 1.0 mg/dL   Nitrite NEGATIVE NEGATIVE   Leukocytes, UA NEGATIVE NEGATIVE    Comment: MICROSCOPIC NOT DONE ON URINES WITH NEGATIVE PROTEIN, BLOOD, LEUKOCYTES, NITRITE, OR GLUCOSE <1000 mg/dL.    Ct Head Wo Contrast  01/12/2014   CLINICAL DATA:  Headache, nausea and vomiting.  EXAM: CT HEAD WITHOUT CONTRAST  TECHNIQUE: Contiguous axial images were obtained from the base of the skull through the vertex without intravenous contrast.  COMPARISON:  Brain CT 04/14/2008  FINDINGS: Periventricular and subcortical white matter hypodensity compatible with chronic small vessel ischemic change. Ventricles and sulci are prominent compatible with atrophy. No evidence for acute cortically based infarct, intracranial hemorrhage, mass lesion or mass effect. Orbits are unremarkable. Mastoid air cells are well aerated. Paranasal sinuses are unremarkable. Calvarium is intact.  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  No acute intracranial process.   Electronically Signed   By: Lovey Newcomer M.D.   On: 01/12/2014 21:14   Dg Chest Portable 1 View  01/12/2014   CLINICAL DATA:  Weakness.  EXAM: PORTABLE CHEST - 1 VIEW  COMPARISON:  12/20/2009  FINDINGS: Normal heart size and mediastinal contours. No acute infiltrate or edema. Skin fold noted at the right apex ;  no pneumothorax. No pleural effusion. No acute osseous findings.  IMPRESSION: No active disease.   Electronically Signed   By: Jorje Guild M.D.   On: 01/12/2014 21:23    Assessment/Plan: 73 year old lady presenting with hypertensive urgency and transient unconsciousness of unclear etiology. Patient most likely experienced an episode of syncope. However, generalized seizure cannot be completely ruled out. Examination is unremarkable at this point with no focal findings and normal mental status.  Recommendations: 1. Management of hypertension per primary care team. 2. MRI of the brain without contrast to rule out acute stroke as well as to rule out signs of PRES. 3. EEG, routine adult study. Expected  We will continue to follow this patient with you.  C.R. Nicole Kindred, MD [facility] [phone]  01/12/2014, 11:40 PM

## 2014-01-12 NOTE — ED Notes (Signed)
Dr Stewart at bedside.

## 2014-01-13 ENCOUNTER — Encounter (HOSPITAL_COMMUNITY): Payer: Self-pay | Admitting: General Practice

## 2014-01-13 ENCOUNTER — Inpatient Hospital Stay (HOSPITAL_COMMUNITY): Payer: Medicare Other

## 2014-01-13 DIAGNOSIS — I059 Rheumatic mitral valve disease, unspecified: Secondary | ICD-10-CM

## 2014-01-13 DIAGNOSIS — R4020 Unspecified coma: Secondary | ICD-10-CM | POA: Insufficient documentation

## 2014-01-13 LAB — BASIC METABOLIC PANEL
Anion gap: 14 (ref 5–15)
BUN: 17 mg/dL (ref 6–23)
CHLORIDE: 106 meq/L (ref 96–112)
CO2: 19 mmol/L (ref 19–32)
Calcium: 9.9 mg/dL (ref 8.4–10.5)
Creatinine, Ser: 1.16 mg/dL — ABNORMAL HIGH (ref 0.50–1.10)
GFR calc Af Amer: 52 mL/min — ABNORMAL LOW (ref >90)
GFR calc non Af Amer: 45 mL/min — ABNORMAL LOW (ref >90)
GLUCOSE: 126 mg/dL — AB (ref 70–99)
POTASSIUM: 3.9 mmol/L (ref 3.5–5.1)
SODIUM: 139 mmol/L (ref 135–145)

## 2014-01-13 LAB — MAGNESIUM: Magnesium: 1.5 mg/dL (ref 1.5–2.5)

## 2014-01-13 LAB — TSH: TSH: 1.991 u[IU]/mL (ref 0.350–4.500)

## 2014-01-13 LAB — TROPONIN I
TROPONIN I: 0.03 ng/mL (ref <0.031)
Troponin I: <0.03 ng/mL (ref <0.031)
Troponin I: 0.03 ng/mL (ref <0.031)

## 2014-01-13 MED ORDER — LORAZEPAM 2 MG/ML IJ SOLN: 1.0000 mg | INTRAMUSCULAR | Status: DC | PRN

## 2014-01-13 MED ORDER — SODIUM CHLORIDE 0.9 % IV SOLN
INTRAVENOUS | Status: DC
  Administered 2014-01-13 (×2): via INTRAVENOUS

## 2014-01-13 MED ORDER — HEPARIN SODIUM (PORCINE) 5000 UNIT/ML IJ SOLN
5000.0000 [IU] | Freq: Three times a day (TID) | INTRAMUSCULAR | Status: DC
  Administered 2014-01-13 – 2014-01-14 (×4): 5000 [IU] via SUBCUTANEOUS
  Filled 2014-01-13 (×3): qty 1

## 2014-01-13 MED ORDER — VITAMIN D3 1.25 MG (50000 UT) PO CAPS: 1.0000 | ORAL_CAPSULE | ORAL | Status: DC

## 2014-01-13 MED ORDER — METOPROLOL TARTRATE 25 MG PO TABS
25.0000 mg | ORAL_TABLET | Freq: Two times a day (BID) | ORAL | Status: DC
  Administered 2014-01-13 – 2014-01-14 (×3): 25 mg via ORAL
  Filled 2014-01-13 (×2): qty 1

## 2014-01-13 MED ORDER — ASPIRIN EC 81 MG PO TBEC
81.0000 mg | DELAYED_RELEASE_TABLET | Freq: Every day | ORAL | Status: DC
  Administered 2014-01-13 – 2014-01-14 (×2): 81 mg via ORAL
  Filled 2014-01-13 (×3): qty 1

## 2014-01-13 MED ORDER — VITAMIN D (ERGOCALCIFEROL) 1.25 MG (50000 UNIT) PO CAPS: 50000.0000 [IU] | ORAL_CAPSULE | ORAL | Status: DC

## 2014-01-13 MED ORDER — ONDANSETRON HCL 4 MG/2ML IJ SOLN: 4.0000 mg | Freq: Four times a day (QID) | INTRAMUSCULAR | Status: DC | PRN

## 2014-01-13 MED ORDER — ONDANSETRON HCL 4 MG PO TABS
4.0000 mg | ORAL_TABLET | Freq: Four times a day (QID) | ORAL | Status: DC | PRN
  Administered 2014-01-14: 4 mg via ORAL
  Filled 2014-01-13: qty 1

## 2014-01-13 MED ORDER — HYDROXYCHLOROQUINE SULFATE 200 MG PO TABS: 200.0000 mg | ORAL_TABLET | Freq: Every day | ORAL | Status: DC

## 2014-01-13 MED ORDER — SULFASALAZINE 500 MG PO TABS
500.0000 mg | ORAL_TABLET | Freq: Two times a day (BID) | ORAL | Status: DC
  Administered 2014-01-13 – 2014-01-14 (×3): 500 mg via ORAL
  Filled 2014-01-13 (×6): qty 1

## 2014-01-13 MED ORDER — HYDROXYCHLOROQUINE SULFATE 200 MG PO TABS
100.0000 mg | ORAL_TABLET | Freq: Every day | ORAL | Status: DC
  Administered 2014-01-13: 100 mg via ORAL
  Filled 2014-01-13 (×2): qty 0.5
  Filled 2014-01-13: qty 1

## 2014-01-13 MED ORDER — SODIUM CHLORIDE 0.9 % IJ SOLN: 3.0000 mL | Freq: Two times a day (BID) | INTRAMUSCULAR | Status: DC

## 2014-01-13 MED ORDER — ACETAMINOPHEN 325 MG PO TABS: 650.0000 mg | ORAL_TABLET | Freq: Four times a day (QID) | ORAL | Status: DC | PRN

## 2014-01-13 MED ORDER — POTASSIUM CHLORIDE CRYS ER 20 MEQ PO TBCR
40.0000 meq | EXTENDED_RELEASE_TABLET | Freq: Once | ORAL | Status: AC
  Administered 2014-01-13: 40 meq via ORAL
  Filled 2014-01-13: qty 2

## 2014-01-13 MED ORDER — METOPROLOL TARTRATE 25 MG PO TABS
ORAL_TABLET | ORAL | Status: AC
  Filled 2014-01-13: qty 1

## 2014-01-13 MED ORDER — BUPROPION HCL ER (SR) 150 MG PO TB12
150.0000 mg | ORAL_TABLET | Freq: Two times a day (BID) | ORAL | Status: DC
  Administered 2014-01-13 – 2014-01-14 (×3): 150 mg via ORAL
  Filled 2014-01-13 (×6): qty 1

## 2014-01-13 MED ORDER — ACETAMINOPHEN 650 MG RE SUPP: 650.0000 mg | Freq: Four times a day (QID) | RECTAL | Status: DC | PRN

## 2014-01-13 MED ORDER — ENSURE COMPLETE PO LIQD: 237.0000 mL | Freq: Two times a day (BID) | ORAL | Status: DC

## 2014-01-13 MED ORDER — ATORVASTATIN CALCIUM 20 MG PO TABS
20.0000 mg | ORAL_TABLET | Freq: Every day | ORAL | Status: DC
  Administered 2014-01-13 – 2014-01-14 (×2): 20 mg via ORAL
  Filled 2014-01-13 (×3): qty 1

## 2014-01-13 MED ORDER — HYDROXYCHLOROQUINE SULFATE 200 MG PO TABS
200.0000 mg | ORAL_TABLET | Freq: Every day | ORAL | Status: DC
Start: 2014-01-13 — End: 2014-01-14
  Administered 2014-01-13 – 2014-01-14 (×2): 200 mg via ORAL
  Filled 2014-01-13 (×3): qty 1

## 2014-01-13 NOTE — Procedures (Signed)
ELECTROENCEPHALOGRAM REPORT   Patient: [name]       Room #: D36 EEG No. ID: [id number] Age: 73 y.o.        Sex: female Referring Physician: Conley Canal Report Date:  01/13/2014        Interpreting Physician: Alexis Goodell  History: [name] is an 73 y.o. female with an episode of loss of consciousness  Medications:  Scheduled: . aspirin EC  81 mg Oral Daily  . atorvastatin  20 mg Oral Daily  . buPROPion  150 mg Oral BID  . heparin  5,000 Units Subcutaneous 3 times per day  . hydroxychloroquine  200 mg Oral Daily   And  . hydroxychloroquine  100 mg Oral QHS  . metoprolol tartrate  25 mg Oral BID  . sodium chloride  3 mL Intravenous Q12H  . sulfaSALAzine  500 mg Oral BID  . [START ON 01/18/2014] Vitamin D (Ergocalciferol)  50,000 Units Oral Q Sun    Conditions of Recording:  This is a 16 channel EEG carried out with the patient in the awake, drowsy and asleep states.  Description:  The waking background activity consists of a low voltage, symmetrical, fairly well organized, 8 Hz alpha activity, seen from the parieto-occipital and posterior temporal regions.  Low voltage fast activity, poorly organized, is seen anteriorly and is at times superimposed on more posterior regions.  A mixture of theta and alpha rhythms are seen from the central and temporal regions. The patient drowses with slowing to irregular, low voltage theta and beta activity.   The patient goes in to a light sleep with symmetrical sleep spindles, vertex central sharp transients and irregular slow activity.   No epileptiform activity is noted. Hyperventilation and intermittent photic stimulation were not performed.   IMPRESSION: This is a normal awake, drowsy and asleep electroencephalogram.  No epileptiform activity is noted.   Alexis Goodell, MD [facility] [phone] 01/13/2014, 12:54 PM

## 2014-01-13 NOTE — Progress Notes (Signed)
  Echocardiogram 2D Echocardiogram has been performed.  [name] 01/13/2014, 3:56 PM

## 2014-01-13 NOTE — Progress Notes (Signed)
[facility] PROGRESS NOTE  [name] [id number] DOB: 10/21/1940 DOA: 01/12/2014 PCP: Maximino Greenland, MD  Assessment/Plan:  Principal Problem:   Syncope and collapse: eeg negative. MRI without anything acute.  ? Seizure? Await neuro recs. 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 improved   Rheumatoid arthritis:    Depression   Hyperlipidemia   HPI/Subjective: HA gone. Feels back to baseline  Objective: Filed Vitals:   01/13/14 1127  BP: 137/66  Pulse: 80  Temp: 99 F (37.2 C)  Resp: 16    Intake/Output Summary (Last 24 hours) at 01/13/14 1452 Last data filed at 01/13/14 1314  Gross per 24 hour  Intake   1120 ml  Output    500 ml  Net    620 ml   Filed Weights   01/12/14 2215 01/13/14 1127  Weight: 64.411 kg (142 lb) 53.434 kg (117 lb 12.8 oz)    Exam:   General:  In chair. Seems weak. Oriented and appropriate  Cardiovascular: RRR without MGR  Respiratory: CTA without WRR  Abdomen: S, NT, ND  Ext: no CCE  Neuro nonfocal  Basic Metabolic Panel:  Recent Labs Lab 01/12/14 2015 01/12/14 2031 01/13/14 0056  NA 142 142 139  K 3.5 3.7 3.9  CL 107 108 106  CO2 19  --  19  GLUCOSE 149* 146* 126*  BUN 18 23 17   CREATININE 1.40* 1.20* 1.16*  CALCIUM 10.9*  --  9.9  MG  --   --  1.5   Liver Function Tests:  Recent Labs Lab 01/12/14 2015  AST 20  ALT 8  ALKPHOS 105  BILITOT 0.8  PROT 7.8  ALBUMIN 3.8   No results for input(s): LIPASE, AMYLASE in the last 168 hours. No results for input(s): AMMONIA in the last 168 hours. CBC:  Recent Labs Lab 01/12/14 2015 01/12/14 2031  WBC 12.3*  --   NEUTROABS 7.6  --   HGB 14.3 16.7*  HCT 43.1 49.0*  MCV 86.2  --   PLT 343  --    Cardiac Enzymes:  Recent Labs Lab 01/12/14 2015 01/13/14 0056 01/13/14 0740 01/13/14 1224  TROPONINI <0.03 <0.03 0.03 0.03   BNP (last 3 results) No results for input(s): PROBNP  in the last 8760 hours. CBG: No results for input(s): GLUCAP in the last 168 hours.  No results found for this or any previous visit (from the past 240 hour(s)).   Studies: Ct Head Wo Contrast  01/12/2014   CLINICAL DATA:  Headache, nausea and vomiting.  EXAM: CT HEAD WITHOUT CONTRAST  TECHNIQUE: Contiguous axial images were obtained from the base of the skull through the vertex without intravenous contrast.  COMPARISON:  Brain CT 04/14/2008  FINDINGS: Periventricular and subcortical white matter hypodensity compatible with chronic small vessel ischemic change. Ventricles and sulci are prominent compatible with atrophy. No evidence for acute cortically based infarct, intracranial hemorrhage, mass lesion or mass effect. Orbits are unremarkable. Mastoid air cells are well aerated. Paranasal sinuses are unremarkable. Calvarium is intact.  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  No acute intracranial process.   Electronically Signed   By: Lovey Newcomer M.D.   On: 01/12/2014 21:14   Mr Brain Wo Contrast  01/13/2014   CLINICAL DATA:  Initial evaluation for headache for 1 day.  Syncope.  EXAM: MRI HEAD WITHOUT CONTRAST  TECHNIQUE: Multiplanar, multiecho pulse sequences of the brain and surrounding structures were obtained without intravenous contrast.  COMPARISON:  Prior CT from 01/12/2014.  FINDINGS: Study is degraded by motion artifact.  Diffuse prominence of the CSF containing spaces is compatible with generalized age-related cerebral atrophy. Patchy and confluent T2/FLAIR hyperintensity within the periventricular and deep white matter both cerebral hemispheres most consistent with chronic small vessel ischemic disease, moderate for patient age. Small vessel type changes present within the pons as well. Small remote lacunar infarct present within the basal ganglia and corona radiata bilaterally. Probable additional small infarct involving the anterior corpus callosum as well.  No  abnormal foci of restricted diffusion to suggest acute intracranial infarct. Gray-white matter differentiation maintained. Normal intravascular flow voids present. No intracranial hemorrhage. Single small subcentimeter chronic microhemorrhage noted within the left temporal lobe.  No mass lesion or midline shift. Mild ventricular prominence with global parenchymal volume loss present without hydrocephalus. No extra-axial fluid collection.  Craniocervical junction within normal limits. Pituitary gland unremarkable.  Degenerative changes noted within the upper cervical spine. Bone marrow signal intensity within normal limits. No acute abnormality seen within the scalp soft tissues.  Orbits within normal limits.  Minimal opacity noted within the inferior aspect of the left maxillary sinus. Paranasal sinuses are otherwise clear. Trace fluid density noted within the inferior left mastoid air cells.  IMPRESSION: 1. No acute intracranial infarct or other abnormality identified. 2. Age-related atrophy with moderate chronic small vessel ischemic disease.   Electronically Signed   By: Jeannine Boga M.D.   On: 01/13/2014 00:52   Dg Chest Portable 1 View  01/12/2014   CLINICAL DATA:  Weakness.  EXAM: PORTABLE CHEST - 1 VIEW  COMPARISON:  12/20/2009  FINDINGS: Normal heart size and mediastinal contours. No acute infiltrate or edema. Skin fold noted at the right apex ; no pneumothorax. No pleural effusion. No acute osseous findings.  IMPRESSION: No active disease.   Electronically Signed   By: Jorje Guild M.D.   On: 01/12/2014 21:23    Scheduled Meds: . aspirin EC  81 mg Oral Daily  . atorvastatin  20 mg Oral Daily  . buPROPion  150 mg Oral BID  . heparin  5,000 Units Subcutaneous 3 times per day  . hydroxychloroquine  200 mg Oral Daily   And  . hydroxychloroquine  100 mg Oral QHS  . metoprolol tartrate  25 mg Oral BID  . sodium chloride  3 mL Intravenous Q12H  . sulfaSALAzine  500 mg Oral BID  .  [START ON 01/18/2014] Vitamin D (Ergocalciferol)  50,000 Units Oral Q Sun   Continuous Infusions: . sodium chloride 75 mL/hr at 01/13/14 1215    Time spent: 35 minutes  [facility] [email or web address], password [facility] 01/13/2014, 2:52 PM  LOS: 1 day

## 2014-01-13 NOTE — Plan of Care (Signed)
Problem: Acute Rehab PT Goals(only PT should resolve) Goal: Pt Will Transfer Bed To Chair/Chair To Bed On LRAD Goal: Pt Will Ambulate Level surfaces

## 2014-01-13 NOTE — ED Notes (Signed)
Pt transported to EEG 

## 2014-01-13 NOTE — Progress Notes (Signed)
EEG Completed; Results Pending  

## 2014-01-13 NOTE — Evaluation (Signed)
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   Her family has been assisting her at home and she is not having success lately, with multiple falls and poor recall of what is causing them.  Will plan to recommend SNF and will see what condition she is in afterward.  Family unavailable to inform them of PT opinion unfortunately, but will hopefully agree.     Follow Up Recommendations SNF;Supervision/Assistance - 24 hour    Equipment Recommendations  None recommended by PT    Recommendations for Other Services       Precautions / Restrictions Precautions Precautions: Fall Restrictions Weight Bearing Restrictions: No      Mobility  Bed Mobility Overal bed mobility: Needs Assistance Bed Mobility: Supine to Sit     Supine to sit: Min assist     General bed mobility comments: used bed rails and offered assistance under trunk and to finish scooting to edge  Transfers Overall transfer level: Needs assistance Equipment used: Rolling walker (2 wheeled);1 person hand held assist Transfers: Sit to/from [facility] Sit to Stand: Mod assist Stand pivot transfers: Min assist       General transfer comment: cues for hand placement and to monitor safety   Ambulation/Gait Ambulation/Gait assistance: Min assist Ambulation Distance (Feet): 40 Feet (20 x 2) Assistive device: Rolling walker (2 wheeled) Gait Pattern/deviations: Step-through pattern;Decreased step length - right;Decreased step length - left;Decreased stride length;Wide base of support;Trunk flexed;Drifts right/left Gait velocity: reduced Gait  velocity interpretation: Below normal speed for age/gender General Gait Details: Has slower pace with tendency to driftL but does not have LOB that direction to stand  Stairs            Wheelchair Mobility    Modified Rankin (Stroke Patients Only)       Balance Overall balance assessment: Needs assistance Sitting-balance support: Bilateral upper extremity supported;Feet supported Sitting balance-Leahy Scale: Fair   Postural control: Posterior lean Standing balance support: Bilateral upper extremity supported Standing balance-Leahy Scale: Poor Standing balance comment: reminders needed to use RW and to attend to immediate details and not be distracted with other things                             Pertinent Vitals/Pain Pain Assessment: No/denies pain    Home Living Family/patient expects to be discharged to:: Private residence Living Arrangements: Spouse/significant other;Other relatives Available Help at Discharge: Family;Available 24 hours/day Type of Home: House Home Access: Stairs to enter Entrance Stairs-Rails: Right Entrance Stairs-Number of Steps: 1 Home Layout: One level Home Equipment: Walker - 2 wheels;Cane - single point;Shower seat Additional Comments: pt is not sure if she has a Insurance claims handler    Prior Function Level of Independence: Needs assistance   Gait / Transfers Assistance Needed: reports she walks only short trips on either cane or walker  ADL's / Homemaking Assistance Needed: family assistance  Comments: pt is giving incomplete information and doesnot appear to remember     Hand Dominance        Extremity/Trunk Assessment   Upper Extremity Assessment: Overall WFL for tasks assessed  Lower Extremity Assessment: Generalized weakness      Cervical / Trunk Assessment: Normal  Communication   Communication: No difficulties;Other (comment) (for actual speech)  Cognition Arousal/Alertness:  Awake/alert Behavior During Therapy: WFL for tasks assessed/performed Overall Cognitive Status: No family/caregiver present to determine baseline cognitive functioning       Memory: Decreased short-term memory              General Comments General comments (skin integrity, edema, etc.): Has generally very pleasant and cooperative personality but does need close monitoring for losing track of  the sequence of events for safe mobility    Exercises        Assessment/Plan    PT Assessment Patient needs continued PT services  PT Diagnosis Difficulty walking   PT Problem List Decreased strength;Decreased range of motion;Decreased activity tolerance;Decreased balance;Decreased mobility;Decreased coordination;Decreased cognition;Decreased knowledge of use of DME;Decreased safety awareness;Decreased knowledge of precautions;Cardiopulmonary status limiting activity  PT Treatment Interventions DME instruction;Gait training;Stair training;Functional mobility training;Therapeutic activities;Therapeutic exercise;Balance training;Neuromuscular re-education;Cognitive remediation;Patient/family education   PT Goals (Current goals can be found in the Care Plan section) Acute Rehab PT Goals Patient Stated Goal: to get to [facility] PT Goal Formulation: With patient Time For Goal Achievement: 01/24/14 Potential to Achieve Goals: Good    Frequency Min 3X/week   Barriers to discharge Other (comment) safety and physical limitations that will make her need 24/7 monitoring    Co-evaluation               End of Session Equipment Utilized During Treatment: Other (comment);Oxygen (FWW) Activity Tolerance: Patient tolerated treatment well Patient left: in chair;with call bell/phone within reach Nurse Communication: Mobility status;Precautions         Time: 1335-1406 PT Time Calculation (min) (ACUTE ONLY): 31 min   Charges:   PT Evaluation $Initial PT Evaluation Tier I: 1 Procedure PT  Treatments $Gait Training: 8-22 mins   PT G Codes:        Ramond Dial 02-02-2014, 2:21 PM   Mee Hives, PT MS Acute Rehab Dept. Number: [id number]

## 2014-01-14 DIAGNOSIS — R404 Transient alteration of awareness: Secondary | ICD-10-CM

## 2014-01-14 MED ORDER — HYDROCODONE-ACETAMINOPHEN 5-325 MG PO TABS: 1.0000 | ORAL_TABLET | Freq: Four times a day (QID) | ORAL | Status: DC | PRN

## 2014-01-14 MED ORDER — ACETAMINOPHEN 325 MG PO TABS: 650.0000 mg | ORAL_TABLET | Freq: Four times a day (QID) | ORAL | Status: DC | PRN

## 2014-01-14 MED ORDER — ONDANSETRON HCL 4 MG PO TABS: 4.0000 mg | ORAL_TABLET | Freq: Four times a day (QID) | ORAL | Status: DC | PRN

## 2014-01-14 MED ORDER — BACLOFEN 10 MG PO TABS: 10.0000 mg | ORAL_TABLET | Freq: Three times a day (TID) | ORAL | Status: DC | PRN

## 2014-01-14 MED ORDER — METOPROLOL SUCCINATE ER 50 MG PO TB24: 50.0000 mg | ORAL_TABLET | Freq: Every day | ORAL | Status: DC

## 2014-01-14 MED ORDER — CETYLPYRIDINIUM CHLORIDE 0.05 % MT LIQD: 7.0000 mL | Freq: Two times a day (BID) | OROMUCOSAL | Status: DC

## 2014-01-14 NOTE — Discharge Summary (Signed)
Physician Discharge Summary  [name] [id number] DOB: 25-Apr-1940 DOA: 01/12/2014  PCP: Maximino Greenland, MD  Admit date: 01/12/2014 Discharge date: 01/14/2014  Time spent: greater than 30 minutes  Recommendations for Outpatient Follow-up:  1. To ST SNF 2. Adjust antihypertensives to optimize blood pressure control  Discharge Diagnoses:  Principal Problem:   Syncope and collapse Active Problems:   Hypertensive urgency, malignant   Rheumatoid arthritis   Depression   Hyperlipidemia Medical noncompliance ckd 3  Discharge Condition: stable  Diet recommendation: heart healthy  Filed Weights   01/12/14 2215 01/13/14 1127 01/14/14 0047  Weight: 64.411 kg (142 lb) 53.434 kg (117 lb 12.8 oz) 53.57 kg (118 lb 1.6 oz)    History of present illness:  73 year old female with history of rheumatoid arthritis, back pain, hypertension (noncompliant to medications as she feels it makes her sick), depression was brought in by EMS after she had an acute syncopal episode. History provided partly by the patient and by her husband at bedside. Patient reports having throbbing headache all day which is unusual for her. Her husband returned from work around 4:30pm and patient was complaining of headaches. She sat down to eat supper with him and after taking a bite she complained of feeling dizzy and then slumped on the side of the chair and passed out. She then had 2 episodes of vomiting but was unresponsive. Husband reports that she was unresponsive for almost 15 minutes. When EMS arrived she was arousable and an EKG done on site showed ST elevation in V2-V3 and was brought in to the ED as code STEMI. EKG read by cardiologist said not STEMI, and code STEMI cancelled. As per husband patient had some tremors in her hand and had urinary incontinence during that event. Patient was awake but drowsy upon arrival to the ED. She was hypertensive with systolic blood pressure in 200s. She reported minimal  headache but no further dizziness, blurred vision, weakness or numbness of her extremities. She denied any visual symptoms or weakness during the episode. She does not recall the event. She denies any history of seizures or stroke in the past. He reports remote migraine symptoms but no similar headaches in the past. She denies fever, chills, chest pain, palpitations, SOB, abdominal pain, bowel symptoms. Denies dysuria or hematuria. Denies change in weight or appetite. She reports that she is not compliant with her blood pressure medications and makes her sick.  Hospital Course:  Admitted to telemetry. Neurology consulted. Started on metoprolol given IVF. No further episodes. CT brain, MRI brain, EEG all unremarkable.  Unclear if she had convulsive syncope, a true seizure, or LOC in the context of hypertensive urgency.  In any case, her neuro work up is unrevealing and this is the first episode in life of transient LOC, and thus will defer AED at this time.  She will need outpatient neurology follow up.  Blood pressure by discharge much improved. Echo showed no wall motion abnormalities, and good EF.  Has worked with PT who recommend [place].   Procedures:  none  Consultations: Neurology  Discharge Exam: Filed Vitals:   01/14/14 0500  BP: 154/84  Pulse: 75  Temp: 98.4 F (36.9 C)  Resp: 14    General: alert, oriented Cardiovascular: RRR without MGR Respiratory: CTA without WRR abd s, nt, nd No CCE  Discharge Instructions   Discharge Instructions    Diet - low sodium heart healthy    Complete by:  As directed  Walk with assistance    Complete by:  As directed           Current Discharge Medication List    START taking these medications   Details  acetaminophen (TYLENOL) 325 MG tablet Take 2 tablets (650 mg total) by mouth every 6 (six) hours as needed for mild pain (or Fever >/= 101).    metoprolol succinate (TOPROL XL) 50 MG 24 hr tablet Take 1 tablet (50 mg total) by  mouth daily. Take with or immediately following a meal.    ondansetron (ZOFRAN) 4 MG tablet Take 1 tablet (4 mg total) by mouth every 6 (six) hours as needed for nausea. Qty: 20 tablet, Refills: 0      CONTINUE these medications which have CHANGED   Details  HYDROcodone-acetaminophen (NORCO/VICODIN) 5-325 MG per tablet Take 1 tablet by mouth every 6 (six) hours as needed for moderate pain. Qty: 20 tablet, Refills: 0   Associated Diagnoses: Knee pain, chronic, left; Rheumatoid arthritis of lower leg; Multiple myeloma in relapse; Malignant bone pain      CONTINUE these medications which have NOT CHANGED   Details  aspirin 81 MG tablet Take 81 mg by mouth daily.      atorvastatin (LIPITOR) 20 MG tablet Take 20 mg by mouth daily.    baclofen (LIORESAL) 10 MG tablet Take 10 mg by mouth 3 (three) times daily as needed for muscle spasms.    buPROPion (WELLBUTRIN SR) 150 MG 12 hr tablet Take 150 mg by mouth 2 (two) times daily.     Cholecalciferol (VITAMIN D3) 50000 UNITS CAPS Take 1 capsule by mouth once a week. Take on Sunday morning    hydroxychloroquine (PLAQUENIL) 200 MG tablet Take 100-200 mg by mouth daily. Take a whole tablet in the morning and half tablet in the evening    sulfaSALAzine (AZULFIDINE) 500 MG tablet Take 500 mg by mouth 2 (two) times daily.      STOP taking these medications     pravastatin (PRAVACHOL) 40 MG tablet      predniSONE (STERAPRED UNI-PAK) 5 MG TABS tablet      valsartan-hydrochlorothiazide (DIOVAN-HCT) 160-25 MG per tablet      lidocaine (LIDODERM) 5 %      predniSONE (DELTASONE) 5 MG tablet        No Known Allergies Follow-up Information    Follow up with [facility] In 1 month.   Contact information:   [address] [place] [address] [phone]       The results of significant diagnostics from this hospitalization (including imaging, microbiology, ancillary and laboratory) are listed  below for reference.    Significant Diagnostic Studies: Ct Head Wo Contrast  01/12/2014   CLINICAL DATA:  Headache, nausea and vomiting.  EXAM: CT HEAD WITHOUT CONTRAST  TECHNIQUE: Contiguous axial images were obtained from the base of the skull through the vertex without intravenous contrast.  COMPARISON:  Brain CT 04/14/2008  FINDINGS: Periventricular and subcortical white matter hypodensity compatible with chronic small vessel ischemic change. Ventricles and sulci are prominent compatible with atrophy. No evidence for acute cortically based infarct, intracranial hemorrhage, mass lesion or mass effect. Orbits are unremarkable. Mastoid air cells are well aerated. Paranasal sinuses are unremarkable. Calvarium is intact.  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  No acute intracranial process.   Electronically Signed   By: Lovey Newcomer M.D.   On: 01/12/2014 21:14   Mr Brain Wo Contrast  01/13/2014  CLINICAL DATA:  Initial evaluation for headache for 1 day.  Syncope.  EXAM: MRI HEAD WITHOUT CONTRAST  TECHNIQUE: Multiplanar, multiecho pulse sequences of the brain and surrounding structures were obtained without intravenous contrast.  COMPARISON:  Prior CT from 01/12/2014.  FINDINGS: Study is degraded by motion artifact.  Diffuse prominence of the CSF containing spaces is compatible with generalized age-related cerebral atrophy. Patchy and confluent T2/FLAIR hyperintensity within the periventricular and deep white matter both cerebral hemispheres most consistent with chronic small vessel ischemic disease, moderate for patient age. Small vessel type changes present within the pons as well. Small remote lacunar infarct present within the basal ganglia and corona radiata bilaterally. Probable additional small infarct involving the anterior corpus callosum as well.  No abnormal foci of restricted diffusion to suggest acute intracranial infarct. Gray-white matter differentiation maintained.  Normal intravascular flow voids present. No intracranial hemorrhage. Single small subcentimeter chronic microhemorrhage noted within the left temporal lobe.  No mass lesion or midline shift. Mild ventricular prominence with global parenchymal volume loss present without hydrocephalus. No extra-axial fluid collection.  Craniocervical junction within normal limits. Pituitary gland unremarkable.  Degenerative changes noted within the upper cervical spine. Bone marrow signal intensity within normal limits. No acute abnormality seen within the scalp soft tissues.  Orbits within normal limits.  Minimal opacity noted within the inferior aspect of the left maxillary sinus. Paranasal sinuses are otherwise clear. Trace fluid density noted within the inferior left mastoid air cells.  IMPRESSION: 1. No acute intracranial infarct or other abnormality identified. 2. Age-related atrophy with moderate chronic small vessel ischemic disease.   Electronically Signed   By: Jeannine Boga M.D.   On: 01/13/2014 00:52   Dg Chest Portable 1 View  01/12/2014   CLINICAL DATA:  Weakness.  EXAM: PORTABLE CHEST - 1 VIEW  COMPARISON:  12/20/2009  FINDINGS: Normal heart size and mediastinal contours. No acute infiltrate or edema. Skin fold noted at the right apex ; no pneumothorax. No pleural effusion. No acute osseous findings.  IMPRESSION: No active disease.   Electronically Signed   By: Jorje Guild M.D.   On: 01/12/2014 21:23    EEG Normal  Echo Left ventricle: The cavity size was normal. Wall thickness was increased in a pattern of moderate LVH. Systolic function was vigorous. The estimated ejection fraction was in the range of 65% to 70%. Wall motion was normal; there were no regional wall motion abnormalities. Doppler parameters are consistent with abnormal left ventricular relaxation (grade 1 diastolic dysfunction). - Aortic valve: Valve area (VTI): 1.53 cm^2. Valve area (Vmax): 1.41 cm^2. Valve  area (Vmean): 1.27 cm^2. - Mitral valve: There was mild regurgitation. Valve area by continuity equation (using LVOT flow): 1.65 cm^2.  ekg Sinus rhythm Jpoint elevation, anterior leads Prolonged QT interval Baseline wander  Microbiology: No results found for this or any previous visit (from the past 240 hour(s)).   Labs: Basic Metabolic Panel:  Recent Labs Lab 01/12/14 2015 01/12/14 2031 01/13/14 0056  NA 142 142 139  K 3.5 3.7 3.9  CL 107 108 106  CO2 19  --  19  GLUCOSE 149* 146* 126*  BUN '18 23 17  ' CREATININE 1.40* 1.20* 1.16*  CALCIUM 10.9*  --  9.9  MG  --   --  1.5   Liver Function Tests:  Recent Labs Lab 01/12/14 2015  AST 20  ALT 8  ALKPHOS 105  BILITOT 0.8  PROT 7.8  ALBUMIN 3.8   No results for input(s): LIPASE,  AMYLASE in the last 168 hours. No results for input(s): AMMONIA in the last 168 hours. CBC:  Recent Labs Lab 01/12/14 2015 01/12/14 2031  WBC 12.3*  --   NEUTROABS 7.6  --   HGB 14.3 16.7*  HCT 43.1 49.0*  MCV 86.2  --   PLT 343  --    Cardiac Enzymes:  Recent Labs Lab 01/12/14 2015 01/13/14 0056 01/13/14 0740 01/13/14 1224  TROPONINI <0.03 <0.03 0.03 0.03   BNP: BNP (last 3 results) No results for input(s): PROBNP in the last 8760 hours. CBG: No results for input(s): GLUCAP in the last 168 hours.  Urinalysis    Component Value Date/Time   COLORURINE YELLOW 01/12/2014 2237   APPEARANCEUR CLEAR 01/12/2014 2237   LABSPEC 1.008 01/12/2014 2237   PHURINE 6.5 01/12/2014 2237   GLUCOSEU NEGATIVE 01/12/2014 2237   HGBUR NEGATIVE 01/12/2014 2237   BILIRUBINUR NEGATIVE 01/12/2014 2237   KETONESUR NEGATIVE 01/12/2014 2237   PROTEINUR NEGATIVE 01/12/2014 2237   UROBILINOGEN 1.0 01/12/2014 2237   NITRITE NEGATIVE 01/12/2014 2237   LEUKOCYTESUR NEGATIVE 01/12/2014 2237     Signed:  [place] L  [facility] 01/14/2014, 11:43 AM

## 2014-01-14 NOTE — Progress Notes (Signed)
CSW (Clinical Education officer, museum) provided pt with bed offers before lunch. Pt wanting to discuss options with her husband. CSW called pt husband and notified of dc plan. Pt husband informed CSW that it would be best for pt to dc to [facility] care. CSW notified pt of conversation and she agrees. CSW notified pt that her husband informed CSW he will meet her at facility. CSW (Clinical Education officer, museum) prepared pt dc packet and placed with shadow chart. CSW arranged non-emergent ambulance transport 3:30pm as requested by pt husband. Pt, pt family, pt nurse, and facility informed. CSW signing off.  [place], [place]

## 2014-01-14 NOTE — Clinical Social Work Placement (Addendum)
    Clinical Social Work Department CLINICAL SOCIAL WORK PLACEMENT NOTE 01/14/2014  Patient:  [name], [name]  Account Number:  [id number] Admit date:  01/12/2014  Clinical Social Worker:  Adair Laundry  Date/time:  01/14/2014 10:43 AM  Clinical Social Work is seeking post-discharge placement for this patient at the following level of care:   [place]   (*CSW will update this form in Epic as items are completed)   01/14/2014  Patient/family provided with [place] Department of Clinical Social Work's list of facilities offering this level of care within the geographic area requested by the patient (or if unable, by the patient's family).  01/14/2014  Patient/family informed of their freedom to choose among providers that offer the needed level of care, that participate in Medicare, Medicaid or managed care program needed by the patient, have an available bed and are willing to accept the patient.  01/14/2014  Patient/family informed of [facility]' ownership interest in [facility], as well as of the fact that they are under no obligation to receive care at this facility.  PASARR submitted to EDS on 01/14/2014 PASARR number received on 01/14/2014  FL2 transmitted to all facilities in geographic area requested by pt/family on  01/14/2014 FL2 transmitted to all facilities within larger geographic area on   Patient informed that his/her managed care company has contracts with or will negotiate with  certain facilities, including the following:     Patient/family informed of bed offers received:  01/14/2014 Patient chooses bed at  Physician recommends and patient chooses bed at    Patient to be transferred [facility]  on  01/14/2014 Patient to be transferred to facility by [facility] Patient and family notified of transfer on 01/14/2014 Name of family member notified:  Blaine Hamper (spouse)  The following physician request were entered in  Epic: Physician Request  Please sign FL2.    Additional CommentsBerton Mount, [place]

## 2014-01-14 NOTE — Progress Notes (Signed)
NEURO HOSPITALIST PROGRESS NOTE   SUBJECTIVE:                                                                                                                        Sitting in her bed reading the newspaper. Michela Pitcher that she is doing much better and has no neurological complains this morning. MRI brain without acute intracranial abnormality. EEG normal.  OBJECTIVE:                                                                                                                           Vital signs in last 24 hours: Temp:  [98.2 F (36.8 C)-99 F (37.2 C)] 98.4 F (36.9 C) (12/30 0500) Pulse Rate:  [72-87] 75 (12/30 0500) Resp:  [13-17] 14 (12/30 0500) BP: (116-160)/(64-84) 154/84 mmHg (12/30 0500) SpO2:  [94 %-100 %] 94 % (12/30 0500) Weight:  [53.434 kg (117 lb 12.8 oz)-53.57 kg (118 lb 1.6 oz)] 53.57 kg (118 lb 1.6 oz) (12/30 0047)  Intake/Output from previous day: 12/29 0701 - 12/30 0700 In: 120 [P.O.:120] Out: -  Intake/Output this shift:   Nutritional status: Diet Heart  Past Medical History  Diagnosis Date  . Hypertension   . Rheumatoid arthritis(714.0)   . Hypercholesterolemia   . Depression     Physical exam: pleasant female in no apparent distress. Head: normocephalic. Neck: supple, no bruits, no JVD. Cardiac: no murmurs. Lungs: clear. Abdomen: soft, no tender, no mass. Extremities: no edema.  Neurologic Exam:  Mental Status: Alert, oriented x 4, thought content appropriate. Speech fluent without evidence of aphasia. Able to follow commands without difficulty. Cranial Nerves: II-Visual fields were normal. III/IV/VI-Pupils were equal and reacted. Extraocular movements were full and conjugate.   V/VII-no facial numbness and no facial weakness. VIII-normal. X-normal speech and symmetrical palatal movement. XII-midline tongue extension Motor: 5/5 bilaterally with normal tone and bulk Sensory: Normal throughout. Deep  Tendon Reflexes: 2+ and symmetric. Plantars: Flexor bilaterally Cerebellar: Normal finger-to-nose testing. Gait: no ataxia.  Lab Results: No results found for: CHOL Lipid Panel No results for input(s): CHOL, TRIG, HDL, CHOLHDL, VLDL, LDLCALC in the last 72 hours.  Studies/Results: Ct Head Wo Contrast  01/12/2014   CLINICAL DATA:  Headache, nausea and vomiting.  EXAM: CT HEAD WITHOUT CONTRAST  TECHNIQUE: Contiguous axial images were obtained from the base of the skull through the vertex without intravenous contrast.  COMPARISON:  Brain CT 04/14/2008  FINDINGS: Periventricular and subcortical white matter hypodensity compatible with chronic small vessel ischemic change. Ventricles and sulci are prominent compatible with atrophy. No evidence for acute cortically based infarct, intracranial hemorrhage, mass lesion or mass effect. Orbits are unremarkable. Mastoid air cells are well aerated. Paranasal sinuses are unremarkable. Calvarium is intact.  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  No acute intracranial process.   Electronically Signed   By: Lovey Newcomer M.D.   On: 01/12/2014 21:14   Mr Brain Wo Contrast  01/13/2014   CLINICAL DATA:  Initial evaluation for headache for 1 day.  Syncope.  EXAM: MRI HEAD WITHOUT CONTRAST  TECHNIQUE: Multiplanar, multiecho pulse sequences of the brain and surrounding structures were obtained without intravenous contrast.  COMPARISON:  Prior CT from 01/12/2014.  FINDINGS: Study is degraded by motion artifact.  Diffuse prominence of the CSF containing spaces is compatible with generalized age-related cerebral atrophy. Patchy and confluent T2/FLAIR hyperintensity within the periventricular and deep white matter both cerebral hemispheres most consistent with chronic small vessel ischemic disease, moderate for patient age. Small vessel type changes present within the pons as well. Small remote lacunar infarct present within the basal ganglia and corona  radiata bilaterally. Probable additional small infarct involving the anterior corpus callosum as well.  No abnormal foci of restricted diffusion to suggest acute intracranial infarct. Gray-white matter differentiation maintained. Normal intravascular flow voids present. No intracranial hemorrhage. Single small subcentimeter chronic microhemorrhage noted within the left temporal lobe.  No mass lesion or midline shift. Mild ventricular prominence with global parenchymal volume loss present without hydrocephalus. No extra-axial fluid collection.  Craniocervical junction within normal limits. Pituitary gland unremarkable.  Degenerative changes noted within the upper cervical spine. Bone marrow signal intensity within normal limits. No acute abnormality seen within the scalp soft tissues.  Orbits within normal limits.  Minimal opacity noted within the inferior aspect of the left maxillary sinus. Paranasal sinuses are otherwise clear. Trace fluid density noted within the inferior left mastoid air cells.  IMPRESSION: 1. No acute intracranial infarct or other abnormality identified. 2. Age-related atrophy with moderate chronic small vessel ischemic disease.   Electronically Signed   By: Jeannine Boga M.D.   On: 01/13/2014 00:52   Dg Chest Portable 1 View  01/12/2014   CLINICAL DATA:  Weakness.  EXAM: PORTABLE CHEST - 1 VIEW  COMPARISON:  12/20/2009  FINDINGS: Normal heart size and mediastinal contours. No acute infiltrate or edema. Skin fold noted at the right apex ; no pneumothorax. No pleural effusion. No acute osseous findings.  IMPRESSION: No active disease.   Electronically Signed   By: Jorje Guild M.D.   On: 01/12/2014 21:23    MEDICATIONS  Scheduled: . antiseptic oral rinse  7 mL Mouth Rinse BID  . aspirin EC  81 mg Oral Daily  . atorvastatin  20 mg Oral Daily  . buPROPion  150  mg Oral BID  . feeding supplement (ENSURE COMPLETE)  237 mL Oral BID BM  . heparin  5,000 Units Subcutaneous 3 times per day  . hydroxychloroquine  200 mg Oral Daily   And  . hydroxychloroquine  100 mg Oral QHS  . metoprolol tartrate  25 mg Oral BID  . sodium chloride  3 mL Intravenous Q12H  . sulfaSALAzine  500 mg Oral BID  . [START ON 01/18/2014] Vitamin D (Ergocalciferol)  50,000 Units Oral Q Sun    ASSESSMENT/PLAN:                                                                                                           73 year old lady presenting with hypertensive urgency and transient unconsciousness with reported shakiness right hand. MRI brain negative for acute abnormality and EEG unremarkable. She is back to baseline. Unclear if she had convulsive syncope, a true seizure, or LOC in the context of hypertensive urgency. In any case, her neuro work up is unrevealing and this is the first episode in life of transient LOC, and thus will defer AED at this time. She will need outpatient neurology follow up. Neurology will sign off.   Dorian Pod, MD [facility] [phone]  01/14/2014, 8:15 AM

## 2014-01-14 NOTE — Progress Notes (Signed)
UR Completed Jevin Camino Graves-Bigelow, RN,BSN [phone]  

## 2014-01-14 NOTE — Clinical Social Work Psychosocial (Signed)
     Clinical Social Work Department BRIEF PSYCHOSOCIAL ASSESSMENT 01/14/2014  Patient:  [name], [name]     Account Number:  [id number]     Admit date:  01/12/2014  Clinical Social Worker:  Adair Laundry  Date/Time:  01/14/2014 10:24 AM  Referred by:  Physician  Date Referred:  01/14/2014 Referred for  SNF Placement   Other Referral:   Interview type:  Patient Other interview type:    PSYCHOSOCIAL DATA Living Status:  HUSBAND Admitted from facility:   Level of care:   Primary support name:  Blaine Hamper Primary support relationship to patient:  SPOUSE Degree of support available:   Pt has good family support    CURRENT CONCERNS Current Concerns  Post-Acute Placement   Other Concerns:    SOCIAL WORK ASSESSMENT / PLAN CSW visited pt room and discussed PT SNF recommendation. Pt informed CSW she agrees she will need some rehab at discharge. Pt confirmed she lives with her husband. Pt informed CSW she is not familiar with area SNFs or ST rehab process. CSW explained ST rehab at [facility] and referral process. Pt agreeable to referral being sent to all [facility]. CSW asked if pt would like for CSW to call her husband and also explained recommendation. Pt informed CSW she would do this when he gets to the hospital. Pt unsure when her husband will arrive at hospital. CSW informed pt that CSW would return after lunch with bed offers.  CSW asked pt nurse to please update pt on dc date/time so pt can promptly inform her family and reduce chances of discharge delay. Pt nurse understanding and agreed.   Assessment/plan status:  Psychosocial Support/Ongoing Assessment of Needs Other assessment/ plan:   Information/referral to community resources:   SNF list to be provided with bed offers    PATIENTS/FAMILYS RESPONSE TO PLAN OF CARE: Pt is agreeable to ST rehab at dc.      [place], [place]

## 2014-01-14 NOTE — Progress Notes (Signed)
UR Completed Karo Rog Graves-Bigelow, RN,BSN [phone]  

## 2014-01-28 ENCOUNTER — Ambulatory Visit: Payer: Medicare Other | Admitting: Neurology

## 2014-02-02 ENCOUNTER — Ambulatory Visit (INDEPENDENT_AMBULATORY_CARE_PROVIDER_SITE_OTHER): Payer: 59 | Admitting: Neurology

## 2014-02-02 ENCOUNTER — Encounter: Payer: Self-pay | Admitting: Neurology

## 2014-02-02 VITALS — BP 138/88 | HR 80 | Resp 16 | Ht 61.0 in | Wt 117.6 lb

## 2014-02-02 DIAGNOSIS — M069 Rheumatoid arthritis, unspecified: Secondary | ICD-10-CM

## 2014-02-02 DIAGNOSIS — R55 Syncope and collapse: Secondary | ICD-10-CM | POA: Diagnosis not present

## 2014-02-02 DIAGNOSIS — I160 Hypertensive urgency: Secondary | ICD-10-CM

## 2014-02-02 DIAGNOSIS — R930 Abnormal findings on diagnostic imaging of skull and head, not elsewhere classified: Secondary | ICD-10-CM | POA: Diagnosis not present

## 2014-02-02 DIAGNOSIS — R4189 Other symptoms and signs involving cognitive functions and awareness: Secondary | ICD-10-CM | POA: Diagnosis not present

## 2014-02-02 DIAGNOSIS — G43009 Migraine without aura, not intractable, without status migrainosus: Secondary | ICD-10-CM

## 2014-02-02 DIAGNOSIS — I10 Essential (primary) hypertension: Secondary | ICD-10-CM | POA: Diagnosis not present

## 2014-02-02 NOTE — Progress Notes (Signed)
[facility]  PATIENT: [name] DOB: February 08, 1940  REFERRING CLINICIAN: Glendale Chard HISTORY FROM: Patient and husband REASON FOR VISIT: Syncope and confusion   HISTORICAL  CHIEF COMPLAINT:  Chief Complaint  Patient presents with  . Loss of Consciousness    Sts. 3 weeks ago she was at home, sitting down eating dinner, had sudden witnessed loc, lasting approx. 15 min. No tremors noted.  Upon waking, husband sts. she was confused, had n/v.  EMS took her to [facility], where she was admitted,  but no clear dx. reached.  Husband sts labs, mri's negative. Upon d/c from [facility], she spent 1 week in rehab and then went home.  No further syncopal episodes./fim    HISTORY OF PRESENT ILLNESS:  I had the pleasure of seeing your patient, [name], at [facility] for a neurologic consultation regarding her syncope. He presented to the emergency room on 01/12/2014 after an episode of syncope witnessed by the husband. Been complaining of headaches the entire day. As she and her husband sat down to eat supper, she took a bite of her food and then complained of feeling dizzy. He cocked her head to the right.   She slumped on the side of the chair and passed out. She had vomiting 2 while she was unresponsive. He believes she was unresponsive for about 10-15 minutes. Also during the episode she had an episode of incontinence. EMS was called and performed an EKG showing ST elevation in V2 and V3. She was unresponsive when they first arrived but was weak by the time she got to the ambulance. Her husband did not note tonic-clonic activity and there was no tongue biting.  When she arrived in the emergency room, her systolic blood pressure was above 200 and she was. She reported mild headache but no weakness numbness or vision changes.  After review of the EKG, Cardiology did not feel that this was a STEMI.  She was admitted to a telemetry monitored bed. Metoprolol  was infused. CT of the brain, MRI of the brain , echocardiogram, EEG were all unremarkable. I personally reviewed the MRI of the brain. It shows mild generalized cortical atrophy. Additionally there is moderate small vessel ischemic change with confluency is noted near the occipital and frontal horns of the lateral ventricles. No acute findings. She was seen by neurology. She was groggy for the rest of that day. She improved over the next day but was still more confused about a week. She went to rehabilitation after 2 days at the main hospital. While in the rehabilitation, she did have confusion and she had some paranoia about the husband. I reviewed the hospital records including the MRI of the brain and lab work. Her ESR was minimally elevated at 28. The MRI of the brain shows moderate small vessel ischemic change, more than expected for her age. However, there are no acute findings.  She has not had similar events in the past. She did have syncope once in the past but came back to herself almost immediately. Of note her blood sugar was low at that time. She has a long history of hypertension and has had some issue with compliance due to side effects of medication. While in rehabilitation, her hypertension medicines were changed around.  She has not had any major cognitive problems as an outpatient past year. However, she does think-short-term memory is not as good as it used to be.  She has had some difficulties with headaches.  This is doing better since she left the hospital. She denies any headache today. When present, the headaches seem worse over the left eye.  She had a severe headache she will get nausea and throw up. The headaches would last for at least several hours. When she was younger she had more classic migraines. She denied or with the recent headaches.    REVIEW OF SYSTEMS:  Constitutional: No fevers, chills, sweats, or change in appetite Eyes: No visual changes, double vision, eye  pain Ear, nose and throat: No hearing loss, ear pain, nasal congestion, sore throat Cardiovascular: No chest pain, palpitations Respiratory:  No shortness of breath at rest or with exertion.   No wheezes GastrointestinaI: No nausea, vomiting, diarrhea, abdominal pain, fecal incontinence Genitourinary:  she has had some incontinence.   She has not had any since she left the hospital. Musculoskeletal:  She has RA with pain in hands and other joints Integumentary: No rash, pruritus, skin lesions Neurological: as above Psychiatric: No depression at this time.  No anxiety Endocrine: No palpitations, diaphoresis, change in appetite, change in weigh or increased thirst Hematologic/Lymphatic:  No anemia, purpura, petechiae. Allergic/Immunologic: No itchy/runny eyes, nasal congestion, recent allergic reactions, rashes  ALLERGIES: No Known Allergies  HOME MEDICATIONS: Outpatient Prescriptions Prior to Visit  Medication Sig Dispense Refill  . acetaminophen (TYLENOL) 325 MG tablet Take 2 tablets (650 mg total) by mouth every 6 (six) hours as needed for mild pain (or Fever >/= 101).    [name] aspirin 81 MG tablet Take 81 mg by mouth daily.      [name] atorvastatin (LIPITOR) 20 MG tablet Take 20 mg by mouth daily.    . baclofen (LIORESAL) 10 MG tablet Take 1 tablet (10 mg total) by mouth 3 (three) times daily as needed for muscle spasms. 15 each 0  . buPROPion (WELLBUTRIN SR) 150 MG 12 hr tablet Take 150 mg by mouth 2 (two) times daily.     . Cholecalciferol (VITAMIN D3) 50000 UNITS CAPS Take 1 capsule by mouth once a week. Take on Sunday morning    . hydroxychloroquine (PLAQUENIL) 200 MG tablet Take 100-200 mg by mouth daily. Take a whole tablet in the morning and half tablet in the evening    . metoprolol succinate (TOPROL XL) 50 MG 24 hr tablet Take 1 tablet (50 mg total) by mouth daily. Take with or immediately following a meal.    . ondansetron (ZOFRAN) 4 MG tablet Take 1 tablet (4 mg total) by mouth every  6 (six) hours as needed for nausea. 20 tablet 0  . sulfaSALAzine (AZULFIDINE) 500 MG tablet Take 500 mg by mouth 2 (two) times daily.    [name] HYDROcodone-acetaminophen (NORCO/VICODIN) 5-325 MG per tablet Take 1 tablet by mouth every 6 (six) hours as needed for moderate pain. 20 tablet 0   No facility-administered medications prior to visit.    PAST MEDICAL HISTORY: Past Medical History  Diagnosis Date  . Hypertension   . Rheumatoid arthritis(714.0)   . Hypercholesterolemia   . Depression   . Headache   . Syncope and collapse   . Vision abnormalities     PAST SURGICAL HISTORY: Past Surgical History  Procedure Laterality Date  . Abdominal hysterectomy    . Bilateral carpal tunnel repair    . Eye surgery      FAMILY HISTORY: Family History  Problem Relation Age of Onset  . Cancer Father   . Prostate cancer Father   . Cancer Brother   .  Heart disease Brother   . Miscarriages / [place] Brother   . Heart disease Mother   . Pneumonia Mother     SOCIAL HISTORY:  History   Social History  . Marital Status: Married    Spouse Name: N/A    Number of Children: N/A  . Years of Education: N/A   Occupational History  . Not on file.   Social History Main Topics  . Smoking status: Current Every Day Smoker -- 1.00 packs/day for 60 years    Types: Cigarettes  . Smokeless tobacco: Never Used  . Alcohol Use: No  . Drug Use: No  . Sexual Activity: Not on file   Other Topics Concern  . Not on file   Social History Narrative     PHYSICAL EXAM  Filed Vitals:   02/02/14 1455 02/02/14 1500  BP: 146/84 138/88  Pulse: 74 80  Resp: 16   Height: '5\' 1"'  (1.549 m)   Weight: 117 lb 9.6 oz (53.343 kg)     Body mass index is 22.23 kg/(m^2).   General: The patient is well-developed and well-nourished and in no acute distress  Eyes:  Funduscopic exam shows normal optic discs and retinal vessels.  Neck: The neck is supple, no carotid bruits are noted.  The neck is  nontender.  Respiratory: The respiratory examination is clear.  Cardiovascular: The cardiovascular examination reveals a regular rate and rhythm, no murmurs, gallops or rubs are noted.  Skin: Extremities are without significant edema.  Neurologic Exam  Mental status: The patient is alert and oriented x 3 at the time of the examination. The patient has good recent and remote memory (3/3 without prompt), with a reduced attention span and concentration ability (WORLD-??; 100-?; 20-17-14-11-?).   Clock is mildly mis-spaced with reversal of short/long hands and pattern continuation has errors.     Cranial nerves: Extraocular movements are full. Pupils are equal, round, and reactive to light and accomodation.  Visual fields are full.  Facial symmetry is present. There is good facial sensation to soft touch bilaterally.Facial strength is normal.  Trapezius and sternocleidomastoid strength is normal. No dysarthria is noted.  The tongue is midline, and the patient has symmetric elevation of the soft palate. No obvious hearing deficits are noted.  Motor:  Muscle bulk and tone are normal. Strength is  5 / 5 in all 4 extremities.   Sensory: Sensory testing is intact to pinprick, soft touch, vibration sensation, and position sense on all 4 extremities.  Coordination: Cerebellar testing reveals good finger-nose-finger and heel-to-shin bilaterally.  Gait and station: Station and gait are normal. Tandem gait is normal. Romberg is negative.   Reflexes: Deep tendon reflexes are symmetric and normal bilaterally. Plantar responses are normal.    DIAGNOSTIC DATA (LABS, IMAGING, TESTING) - I reviewed patient records, labs, notes, testing and imaging myself where available.  Lab Results  Component Value Date   WBC 12.3* 01/12/2014   HGB 16.7* 01/12/2014   HCT 49.0* 01/12/2014   MCV 86.2 01/12/2014   PLT 343 01/12/2014      Component Value Date/Time   NA 139 01/13/2014 0056   K 3.9 01/13/2014 0056    CL 106 01/13/2014 0056   CO2 19 01/13/2014 0056   GLUCOSE 126* 01/13/2014 0056   BUN 17 01/13/2014 0056   CREATININE 1.16* 01/13/2014 0056   CALCIUM 9.9 01/13/2014 0056   PROT 7.8 01/12/2014 2015   ALBUMIN 3.8 01/12/2014 2015   AST 20 01/12/2014 2015   ALT 8  01/12/2014 2015   ALKPHOS 105 01/12/2014 2015   BILITOT 0.8 01/12/2014 2015   GFRNONAA 45* 01/13/2014 0056   GFRAA 52* 01/13/2014 0056   No results found for: CHOL, HDL, LDLCALC, LDLDIRECT, TRIG, CHOLHDL Lab Results  Component Value Date   HGBA1C  02/09/2009    5.9 (NOTE) The ADA recommends the following therapeutic goal for glycemic control related to Hgb A1c measurement: Goal of therapy: <6.5 Hgb A1c  Reference: American Diabetes Association: Clinical Practice Recommendations 2010, Diabetes Care, 2010, 33: (Suppl  1).   Lab Results  Component Value Date   [id number] 431 02/12/2009   Lab Results  Component Value Date   TSH 1.991 01/13/2014   No components found for: VITAMIND     ASSESSMENT AND PLAN  Syncope and collapse  Hypertensive urgency, malignant  Rheumatoid arthritis  Abnormal MRI of head  Cognitive changes  Migraine without aura and without status migrainosus, not intractable  In summary, [name] is a 74 year old woman who had an episode of loss of consciousness lasting 15 minutes that was not associated with any tonic-clonic activity but followed by confusion and mild agitation. She is now almost back to her baseline. She appears to have mild cognitive impairment at baseline with more difficulty with visual spatial and executive function and with memory. This could be due to her moderate extent of small vessel ischemic change noted on the MRI. Obviously, premorbid Alzheimer's cannot be ruled out. I discussed this with her and her husband at this point, I do not think we need to start any medications. She had a very thorough evaluation at the hospital just a couple weeks ago and no further imaging  or other testing is necessary at this time. I will want to see her back in 5 months or so or sooner if her husband notes more difficulties with her cognition or if she experiences more spells. If her cognition gets progressively worse, I would consider starting donepezil or another medication at that time.  Thank you very much for asking me to see [name] for a neurologic consultation regarding her syncope and cognitive changes. Please let me know if I can be of further assistance with her or with other patients in the future.   [name] A. Felecia Shelling, MD, PhD 5/40/0867, 6:19 PM Certified in Neurology, Clinical Neurophysiology, Sleep Medicine, Pain Medicine and Neuroimaging  [facility] [address], [place], [place] [address] [phone]

## 2014-07-06 ENCOUNTER — Ambulatory Visit: Payer: Medicare Other | Admitting: Diagnostic Neuroimaging

## 2014-07-06 ENCOUNTER — Ambulatory Visit: Payer: Medicare Other | Admitting: Neurology

## 2014-07-08 ENCOUNTER — Ambulatory Visit: Payer: 59 | Admitting: Neurology

## 2014-07-17 ENCOUNTER — Encounter: Payer: Self-pay | Admitting: Neurology

## 2014-07-30 ENCOUNTER — Encounter: Payer: Self-pay | Admitting: Neurology

## 2014-07-30 ENCOUNTER — Ambulatory Visit (INDEPENDENT_AMBULATORY_CARE_PROVIDER_SITE_OTHER): Payer: Medicare Other | Admitting: Neurology

## 2014-07-30 VITALS — BP 126/78 | HR 70 | Resp 14 | Ht 61.0 in | Wt 112.2 lb

## 2014-07-30 DIAGNOSIS — G43009 Migraine without aura, not intractable, without status migrainosus: Secondary | ICD-10-CM

## 2014-07-30 DIAGNOSIS — R4189 Other symptoms and signs involving cognitive functions and awareness: Secondary | ICD-10-CM

## 2014-07-30 DIAGNOSIS — R930 Abnormal findings on diagnostic imaging of skull and head, not elsewhere classified: Secondary | ICD-10-CM

## 2014-07-30 DIAGNOSIS — R55 Syncope and collapse: Secondary | ICD-10-CM | POA: Diagnosis not present

## 2014-07-30 NOTE — Patient Instructions (Signed)
We will schedule a follow-up visit for one year. If the headaches worsen or the memory worsens further, please let us know before the next appointment.

## 2014-07-30 NOTE — Progress Notes (Signed)
[facility]  PATIENT: [name] DOB: [birth year]  REFERRING CLINICIAN: Glendale Chard HISTORY FROM: Patient and husband REASON FOR VISIT: Syncope and confusion   HISTORICAL  CHIEF COMPLAINT:  Chief Complaint  Patient presents with  . Loss of Consciousness    She denies new syncopal episodes.  Sts. has had about 1 sever h/a weekly for the last 3 weeks.  Sts. h/a's usually occur after eating but she is not able to link h/a's to any certain food.  N/V, light and sound sensitivity associated with h/a's.  She is not sure if Tylenol helps.  Has to sleep h/a's off./fim  . Headaches    HISTORY OF PRESENT ILLNESS:  [name] is a 74 year old woman who has had episodes of syncope and also reports headaches  Syncope:  She has not had any further episode of syncope but has had lightheadedness associated with headaches once or twice each month.    There was not a headache with her episode of syncope.   She notes no seizure activity.    Headaches:   She gets headaches over her left eye.   She denies any change in vision and no neck pain.    Most headaches last about 4 hours.  She gets photophobia and phonophobia.   Tylenol and sleep help.  Moving increases the pain.  She used to have frequent migraines when younger but they came back about 6 month ago.     ESR was 28 in December 2015.   Chewing or talking does not worsen headache.    Memory:   She feels her memory is not as good as as it used to be.    She has trouble remembering appointments, even if she writes them down.     Data:  MRI of the brain 12/2013 shows mild generalized cortical atrophy. Additionally there is moderate small vessel ischemic change with confluency is noted near the occipital and frontal horns of the lateral ventricles. No acute findings. EEG was unremarkable.     REVIEW OF SYSTEMS:  Constitutional: No fevers, chills, sweats, or change in appetite Eyes: No visual changes, double vision, eye  pain Ear, nose and throat: No hearing loss, ear pain, nasal congestion, sore throat Cardiovascular: No chest pain, palpitations Respiratory:  No shortness of breath at rest or with exertion.   No wheezes GastrointestinaI: No nausea, vomiting, diarrhea, abdominal pain, fecal incontinence Genitourinary:  she has had some incontinence.   She has not had any since she left the hospital. Musculoskeletal:  She has RA with pain in hands and other joints Integumentary: No rash, pruritus, skin lesions Neurological: as above Psychiatric: No depression at this time.  No anxiety Endocrine: No palpitations, diaphoresis, change in appetite, change in weigh or increased thirst Hematologic/Lymphatic:  No anemia, purpura, petechiae. Allergic/Immunologic: No itchy/runny eyes, nasal congestion, recent allergic reactions, rashes  ALLERGIES: No Known Allergies  HOME MEDICATIONS: Outpatient Prescriptions Prior to Visit  Medication Sig Dispense Refill  . acetaminophen (TYLENOL) 325 MG tablet Take 2 tablets (650 mg total) by mouth every 6 (six) hours as needed for mild pain (or Fever >/= 101).    [name] aspirin 81 MG tablet Take 81 mg by mouth daily.      [name] atorvastatin (LIPITOR) 20 MG tablet Take 20 mg by mouth daily.    [name] buPROPion (WELLBUTRIN SR) 150 MG 12 hr tablet Take 150 mg by mouth 2 (two) times daily.     . Cholecalciferol (VITAMIN D3) 50000 UNITS CAPS  Take 1 capsule by mouth once a week. Take on Sunday morning    . hydroxychloroquine (PLAQUENIL) 200 MG tablet Take 100-200 mg by mouth daily. Take a whole tablet in the morning and half tablet in the evening    . metoprolol succinate (TOPROL XL) 50 MG 24 hr tablet Take 1 tablet (50 mg total) by mouth daily. Take with or immediately following a meal.    . ondansetron (ZOFRAN) 4 MG tablet Take 1 tablet (4 mg total) by mouth every 6 (six) hours as needed for nausea. 20 tablet 0  . sulfaSALAzine (AZULFIDINE) 500 MG tablet Take 500 mg by mouth 2 (two) times daily.     . traMADol (ULTRAM) 50 MG tablet   0  . HYDROcodone-acetaminophen (NORCO/VICODIN) 5-325 MG per tablet   0   No facility-administered medications prior to visit.    PAST MEDICAL HISTORY: Past Medical History  Diagnosis Date  . Hypertension   . Rheumatoid arthritis(714.0)   . Hypercholesterolemia   . Depression   . Headache   . Syncope and collapse   . Vision abnormalities     PAST SURGICAL HISTORY: Past Surgical History  Procedure Laterality Date  . Abdominal hysterectomy    . Bilateral carpal tunnel repair    . Eye surgery      FAMILY HISTORY: Family History  Problem Relation Age of Onset  . Cancer Father   . Prostate cancer Father   . Cancer Brother   . Heart disease Brother   . Miscarriages / [place] Brother   . Heart disease Mother   . Pneumonia Mother     SOCIAL HISTORY:  History   Social History  . Marital Status: Married    Spouse Name: N/A  . Number of Children: N/A  . Years of Education: N/A   Occupational History  . Not on file.   Social History Main Topics  . Smoking status: Current Every Day Smoker -- 1.00 packs/day for 60 years    Types: Cigarettes  . Smokeless tobacco: Never Used  . Alcohol Use: No  . Drug Use: No  . Sexual Activity: Not on file   Other Topics Concern  . Not on file   Social History Narrative     PHYSICAL EXAM  Filed Vitals:   07/30/14 1349  BP: 126/78  Pulse: 70  Resp: 14  Height: '5\' 1"'  (1.549 m)  Weight: 112 lb 3.2 oz (50.894 kg)    Body mass index is 21.21 kg/(m^2).   General: The patient is well-developed and well-nourished and in no acute distress  Neck is nontender  Skin: Extremities are without significant edema.  Neurologic Exam  Mental status: The patient is alert and oriented x 3 at the time of the examination. The patient has good recent and remote memory (3/3), with a reduced attention span and concentration ability (WORLD- DLWOR; 20-17-14-?).     Cranial nerves: Extraocular  movements are full. Pupils are equal, round, and reactive to light and accomodation.  Facial symmetry is present. There is good facial sensation to soft touch bilaterally.Facial strength is normal.  Trapezius and sternocleidomastoid strength is normal. No dysarthria is noted.  The tongue is midline, and the patient has symmetric elevation of the soft palate. No obvious hearing deficits are noted.  Motor:  Muscle bulk and tone are normal. Strength is  4 / 5 proximally in legs and 5/5 elsewhere.   .   Sensory: Sensory testing is intact to pinprick, soft touch, vibration sensation, and position  sense on all 4 extremities.  Coordination: Cerebellar testing reveals good finger-nose-finger and bilaterally.  Gait and station: Needed help rising from chair.  Station is stable and gait is arthritic. Romberg is negative.   Reflexes: Deep tendon reflexes are symmetric and normal in arms and trace at knees bilaterally.     DIAGNOSTIC DATA (LABS, IMAGING, TESTING) - I reviewed patient records, labs, notes, testing and imaging myself where available.  Lab Results  Component Value Date   WBC 12.3* 01/12/2014   HGB 16.7* 01/12/2014   HCT 49.0* 01/12/2014   MCV 86.2 01/12/2014   PLT 343 01/12/2014      Component Value Date/Time   NA 139 01/13/2014 0056   K 3.9 01/13/2014 0056   CL 106 01/13/2014 0056   CO2 19 01/13/2014 0056   GLUCOSE 126* 01/13/2014 0056   BUN 17 01/13/2014 0056   CREATININE 1.16* 01/13/2014 0056   CALCIUM 9.9 01/13/2014 0056   PROT 7.8 01/12/2014 2015   ALBUMIN 3.8 01/12/2014 2015   AST 20 01/12/2014 2015   ALT 8 01/12/2014 2015   ALKPHOS 105 01/12/2014 2015   BILITOT 0.8 01/12/2014 2015   GFRNONAA 45* 01/13/2014 0056   GFRAA 52* 01/13/2014 0056   No results found for: CHOL, HDL, LDLCALC, LDLDIRECT, TRIG, CHOLHDL Lab Results  Component Value Date   HGBA1C  02/09/2009    5.9 (NOTE) The ADA recommends the following therapeutic goal for glycemic control related to Hgb  A1c measurement: Goal of therapy: <6.5 Hgb A1c  Reference: American Diabetes Association: Clinical Practice Recommendations 2010, Diabetes Care, 2010, 33: (Suppl  1).   Lab Results  Component Value Date   [id number] 094 02/12/2009   Lab Results  Component Value Date   TSH 1.991 01/13/2014      ASSESSMENT AND PLAN  Syncope, unspecified syncope type  Migraine without aura and without status migrainosus, not intractable  Abnormal MRI of head  Cognitive changes    1.   Ibuprofen prn for headache. 2.   If headaches worsen, consider re-checking ESR. 3.   Memory / cognition is stable.    4.   If gait worsens, consider cervical spine MRI rtc one year for re-evaluation.   Call sooner if new or worsening neurologic symptoms  Aryah Doering A. Felecia Shelling, MD, PhD 07/24/6281, 6:62 PM Certified in Neurology, Clinical Neurophysiology, Sleep Medicine, Pain Medicine and Neuroimaging  [facility] [address] [place], [place] [address] [phone]

## 2015-02-05 ENCOUNTER — Observation Stay (HOSPITAL_COMMUNITY)
Admission: EM | Admit: 2015-02-05 | Discharge: 2015-02-07 | Disposition: A | Discharge status: Home / Self Care | Payer: Medicare Other | Attending: Internal Medicine | Admitting: Internal Medicine

## 2015-02-05 ENCOUNTER — Emergency Department (HOSPITAL_COMMUNITY): Payer: Medicare Other

## 2015-02-05 ENCOUNTER — Encounter (HOSPITAL_COMMUNITY): Payer: Self-pay | Admitting: *Deleted

## 2015-02-05 DIAGNOSIS — E785 Hyperlipidemia, unspecified: Secondary | ICD-10-CM | POA: Diagnosis not present

## 2015-02-05 DIAGNOSIS — N183 Chronic kidney disease, stage 3 (moderate): Secondary | ICD-10-CM | POA: Insufficient documentation

## 2015-02-05 DIAGNOSIS — I160 Hypertensive urgency: Secondary | ICD-10-CM

## 2015-02-05 DIAGNOSIS — F17210 Nicotine dependence, cigarettes, uncomplicated: Secondary | ICD-10-CM | POA: Insufficient documentation

## 2015-02-05 DIAGNOSIS — R4781 Slurred speech: Secondary | ICD-10-CM | POA: Insufficient documentation

## 2015-02-05 DIAGNOSIS — R569 Unspecified convulsions: Secondary | ICD-10-CM

## 2015-02-05 DIAGNOSIS — R262 Difficulty in walking, not elsewhere classified: Secondary | ICD-10-CM | POA: Diagnosis not present

## 2015-02-05 DIAGNOSIS — G40909 Epilepsy, unspecified, not intractable, without status epilepticus: Secondary | ICD-10-CM | POA: Diagnosis not present

## 2015-02-05 DIAGNOSIS — N189 Chronic kidney disease, unspecified: Secondary | ICD-10-CM

## 2015-02-05 DIAGNOSIS — N179 Acute kidney failure, unspecified: Secondary | ICD-10-CM | POA: Insufficient documentation

## 2015-02-05 DIAGNOSIS — D72829 Elevated white blood cell count, unspecified: Secondary | ICD-10-CM | POA: Insufficient documentation

## 2015-02-05 DIAGNOSIS — I129 Hypertensive chronic kidney disease with stage 1 through stage 4 chronic kidney disease, or unspecified chronic kidney disease: Secondary | ICD-10-CM | POA: Insufficient documentation

## 2015-02-05 DIAGNOSIS — E7800 Pure hypercholesterolemia, unspecified: Secondary | ICD-10-CM | POA: Diagnosis not present

## 2015-02-05 DIAGNOSIS — G9340 Encephalopathy, unspecified: Secondary | ICD-10-CM

## 2015-02-05 DIAGNOSIS — M069 Rheumatoid arthritis, unspecified: Secondary | ICD-10-CM | POA: Diagnosis not present

## 2015-02-05 DIAGNOSIS — Z7982 Long term (current) use of aspirin: Secondary | ICD-10-CM | POA: Insufficient documentation

## 2015-02-05 HISTORY — DX: Chronic kidney disease, unspecified: N18.9

## 2015-02-05 HISTORY — DX: Acute kidney failure, unspecified: N17.9

## 2015-02-05 HISTORY — DX: Unspecified convulsions: R56.9

## 2015-02-05 LAB — COMPREHENSIVE METABOLIC PANEL
ALBUMIN: 3.4 g/dL — AB (ref 3.5–5.0)
ALK PHOS: 98 U/L (ref 38–126)
ALT: 13 U/L — ABNORMAL LOW (ref 14–54)
AST: 23 U/L (ref 15–41)
Anion gap: 11 (ref 5–15)
BILIRUBIN TOTAL: 0.3 mg/dL (ref 0.3–1.2)
BUN: 30 mg/dL — AB (ref 6–20)
CALCIUM: 9.8 mg/dL (ref 8.9–10.3)
CO2: 25 mmol/L (ref 22–32)
CREATININE: 1.88 mg/dL — AB (ref 0.44–1.00)
Chloride: 105 mmol/L (ref 101–111)
GFR calc Af Amer: 29 mL/min — ABNORMAL LOW (ref >60)
GFR calc non Af Amer: 25 mL/min — ABNORMAL LOW (ref >60)
GLUCOSE: 120 mg/dL — AB (ref 65–99)
Potassium: 3.8 mmol/L (ref 3.5–5.1)
Sodium: 141 mmol/L (ref 135–145)
TOTAL PROTEIN: 7.3 g/dL (ref 6.5–8.1)

## 2015-02-05 LAB — I-STAT CHEM 8, ED
BUN: 32 mg/dL — ABNORMAL HIGH (ref 6–20)
CALCIUM ION: 1.26 mmol/L (ref 1.13–1.30)
CREATININE: 1.8 mg/dL — AB (ref 0.44–1.00)
Chloride: 103 mmol/L (ref 101–111)
GLUCOSE: 116 mg/dL — AB (ref 65–99)
HCT: 46 % (ref 36.0–46.0)
HEMOGLOBIN: 15.6 g/dL — AB (ref 12.0–15.0)
POTASSIUM: 3.6 mmol/L (ref 3.5–5.1)
Sodium: 143 mmol/L (ref 135–145)
TCO2: 25 mmol/L (ref 0–100)

## 2015-02-05 LAB — CBC
HEMATOCRIT: 41.3 % (ref 36.0–46.0)
HEMOGLOBIN: 14.4 g/dL (ref 12.0–15.0)
MCH: 29.8 pg (ref 26.0–34.0)
MCHC: 34.9 g/dL (ref 30.0–36.0)
MCV: 85.5 fL (ref 78.0–100.0)
Platelets: 325 10*3/uL (ref 150–400)
RBC: 4.83 MIL/uL (ref 3.87–5.11)
RDW: 13.6 % (ref 11.5–15.5)
WBC: 11 10*3/uL — AB (ref 4.0–10.5)

## 2015-02-05 LAB — DIFFERENTIAL
BASOS ABS: 0 10*3/uL (ref 0.0–0.1)
BASOS PCT: 0 %
EOS PCT: 0 %
Eosinophils Absolute: 0 10*3/uL (ref 0.0–0.7)
Lymphocytes Relative: 34 %
Lymphs Abs: 3.7 10*3/uL (ref 0.7–4.0)
MONOS PCT: 11 %
Monocytes Absolute: 1.2 10*3/uL — ABNORMAL HIGH (ref 0.1–1.0)
Neutro Abs: 6.1 10*3/uL (ref 1.7–7.7)
Neutrophils Relative %: 55 %

## 2015-02-05 LAB — APTT: APTT: 27 s (ref 24–37)

## 2015-02-05 LAB — I-STAT TROPONIN, ED: Troponin i, poc: 0 ng/mL (ref 0.00–0.08)

## 2015-02-05 LAB — PROTIME-INR
INR: 1.17 (ref 0.00–1.49)
Prothrombin Time: 15.1 seconds (ref 11.6–15.2)

## 2015-02-05 LAB — ETHANOL: Alcohol, Ethyl (B): <5 mg/dL (ref <5)

## 2015-02-05 MED ORDER — SODIUM CHLORIDE 0.9 % IV SOLN
1000.0000 mg | Freq: Two times a day (BID) | INTRAVENOUS | Status: DC
  Administered 2015-02-05: 1000 mg via INTRAVENOUS
  Filled 2015-02-05: qty 10

## 2015-02-05 MED ORDER — ACETAMINOPHEN 325 MG PO TABS
650.0000 mg | ORAL_TABLET | Freq: Four times a day (QID) | ORAL | Status: DC | PRN
  Administered 2015-02-06: 650 mg via ORAL
  Filled 2015-02-05: qty 2

## 2015-02-05 MED ORDER — HYDRALAZINE HCL 20 MG/ML IJ SOLN
10.0000 mg | Freq: Once | INTRAMUSCULAR | Status: DC
  Filled 2015-02-05: qty 1

## 2015-02-05 MED ORDER — HYDRALAZINE HCL 20 MG/ML IJ SOLN
10.0000 mg | Freq: Once | INTRAMUSCULAR | Status: AC
  Administered 2015-02-05: 10 mg via INTRAVENOUS

## 2015-02-05 NOTE — ED Notes (Signed)
Family at bedside. 

## 2015-02-05 NOTE — ED Notes (Signed)
Patient went straight to CT at this time.

## 2015-02-05 NOTE — H&P (Signed)
[facility] History and Physical  [name] [id number] DOB: [birth year] DOA: 02/05/2015  Referring physician: ED PCP: Maximino Greenland, MD   Chief Complaint: Altered mental status  HPI:  [name] is a 75 year old female with a past medical history significant for HTN, HLD, RA, and history of syncope/ seizure in December 2015; who presents after having some reported episode of around 2120 of staring gaze to the right side and not responding verbally. Patient who is normally continent was also noted to be incontinent of bowel and bladder during this time. EMS was called and brought the patient into the emergency room at that time she was somewhat responsive and talking although confused. Her initial blood pressure was seen to be significantly elevated with systolic blood pressure greater than 200. She was evaluated with stat CT scan which showed no acute abnormalities. Neurology evaluated her and hearing and examining patient thought more likely seizure and recommended MRI of the brain and EEG. Patient reporting complaints of a headache and does not recall events leading up to coming into the hospital. Her daughter states that she was on the phone with her around 17 and she was relatively normal at that time. Patient was not on any AED medications.    Review of Systems  Constitutional: Positive for chills and malaise/fatigue. Negative for fever.  HENT: Negative for ear pain.   Eyes: Negative for double vision, pain and discharge.  Respiratory: Negative for sputum production and shortness of breath.   Cardiovascular: Negative for chest pain and claudication.  Gastrointestinal: Negative for vomiting and abdominal pain.  Genitourinary: Negative for urgency and frequency.        Incontinence of bowel and bladder  Skin: Negative for itching and rash.  Neurological: Positive for tremors, seizures ( possible), loss of consciousness and headaches.  Endo/Heme/Allergies: Negative for  environmental allergies. Does not bruise/bleed easily.  Psychiatric/Behavioral: Negative for hallucinations and substance abuse.     Past Medical History  Diagnosis Date  . Hypertension   . Rheumatoid arthritis(714.0)   . Hypercholesterolemia   . Depression   . Headache   . Syncope and collapse   . Vision abnormalities      Past Surgical History  Procedure Laterality Date  . Abdominal hysterectomy    . Bilateral carpal tunnel repair    . Eye surgery        Social History:  reports that she has been smoking Cigarettes.  She has a 60 pack-year smoking history. She has never used smokeless tobacco. She reports that she does not drink alcohol or use illicit drugs.   No Known Allergies  Family History  Problem Relation Age of Onset  . Cancer Father   . Prostate cancer Father   . Cancer Brother   . Heart disease Brother   . Miscarriages / [place] Brother   . Heart disease Mother   . Pneumonia Mother         Prior to Admission medications   Medication Sig Start Date End Date Taking? Authorizing Provider  acetaminophen (TYLENOL) 325 MG tablet Take 2 tablets (650 mg total) by mouth every 6 (six) hours as needed for mild pain (or Fever >/= 101). 01/14/14   Delfina Redwood, MD  aspirin 81 MG tablet Take 81 mg by mouth daily.      Historical Provider, MD  atorvastatin (LIPITOR) 20 MG tablet Take 20 mg by mouth daily.    Historical Provider, MD  buPROPion (WELLBUTRIN SR) 150 MG 12 hr tablet Take  150 mg by mouth 2 (two) times daily.     Historical Provider, MD  carvedilol (COREG) 6.25 MG tablet  07/11/14   Historical Provider, MD  Cholecalciferol (VITAMIN D3) 50000 UNITS CAPS Take 1 capsule by mouth once a week. Take on Sunday morning    Historical Provider, MD  HYDROcodone-acetaminophen (NORCO/VICODIN) 5-325 MG per tablet  01/22/14   Historical Provider, MD  hydroxychloroquine (PLAQUENIL) 200 MG tablet Take 100-200 mg by mouth daily. Take a whole tablet in the morning and  half tablet in the evening    Historical Provider, MD  metoprolol succinate (TOPROL XL) 50 MG 24 hr tablet Take 1 tablet (50 mg total) by mouth daily. Take with or immediately following a meal. 01/14/14   Delfina Redwood, MD  ondansetron (ZOFRAN) 4 MG tablet Take 1 tablet (4 mg total) by mouth every 6 (six) hours as needed for nausea. 01/14/14   Delfina Redwood, MD  sulfaSALAzine (AZULFIDINE) 500 MG tablet Take 500 mg by mouth 2 (two) times daily.    Historical Provider, MD  traMADol Veatrice Bourbon) 50 MG tablet  11/07/13   Historical Provider, MD     Physical Exam: Filed Vitals:   02/05/15 2230 02/05/15 2232 02/05/15 2243 02/05/15 2245  BP: 196/105 196/105 195/105 170/87  Pulse: 72     Resp: 20     SpO2: 100%        Constitutional: Vital signs reviewed. Patient is somewhat confused, but alert oriented at least to person. Head: Normocephalic and atraumatic  Ear: TM normal bilaterally  Mouth: no erythema or exudates, MMM  Eyes: PERRL, EOMI, conjunctivae normal, No scleral icterus.  Neck: Supple, Trachea midline normal ROM, No JVD, mass, thyromegaly, or carotid bruit present.  Cardiovascular: RRR, S1 normal, S2 normal, no MRG, pulses symmetric and intact bilaterally  Pulmonary/Chest: CTAB, no wheezes, rales, or rhonchi  Abdominal: Soft. Non-tender, non-distended, bowel sounds are normal, no masses, organomegaly, or guarding present.  GU: no CVA tenderness Musculoskeletal: No joint deformities, erythema, or stiffness, ROM full and no nontender Ext: no edema and no cyanosis, pulses palpable bilaterally (DP and PT)  Hematology: no cervical, inginal, or axillary adenopathy.  Neurological: Strenght is normal and symmetric bilaterally, cranial nerve II-XII are grossly intact, no focal motor deficit, sensory intact to light touch bilaterally.  Skin: Warm, dry and intact. No rash, cyanosis, or clubbing.  Psychiatric:   Patient still groggy with oss of memory of a portion of today     Data  Review   Micro Results No results found for this or any previous visit (from the past 240 hour(s)).  Radiology Reports Ct Head Wo Contrast  02/05/2015  CLINICAL DATA:  Code stroke. Right-sided gaze and slurred speech. Initial encounter. EXAM: CT HEAD WITHOUT CONTRAST TECHNIQUE: Contiguous axial images were obtained from the base of the skull through the vertex without intravenous contrast. COMPARISON:  CT of the head performed 01/12/2014, and MRI of the brain performed 01/13/2014 FINDINGS: There is no evidence of acute infarction, mass lesion, or intra- or extra-axial hemorrhage on CT. Prominence of the ventricles and sulci reflects mild cortical volume loss. Mild cerebellar atrophy is noted. Scattered periventricular and subcortical white matter change likely reflects small vessel ischemic microangiopathy. Chronic lacunar infarcts are noted at the right basal ganglia and right thalamus. The brainstem and fourth ventricle are within normal limits. The cerebral hemispheres demonstrate grossly normal gray-white differentiation. No mass effect or midline shift is seen. There is no evidence of fracture; visualized osseous structures are  unremarkable in appearance. The orbits are within normal limits. The paranasal sinuses and mastoid air cells are well-aerated. No significant soft tissue abnormalities are seen. IMPRESSION: 1. No acute intracranial pathology seen on CT. 2. Mild cortical volume loss and scattered small vessel ischemic microangiopathy. 3. 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 These results were called by telephone at the time of interpretation on 02/05/2015 at 10:09 pm to Dr. Nicole Kindred, who verbally acknowledged these results. Electronically Signed   By: Garald Balding M.D.   On: 02/05/2015 22:10     CBC  Recent Labs Lab 02/05/15 2202 02/05/15 2209  WBC 11.0*  --   HGB 14.4 15.6*  HCT 41.3 46.0  PLT 325  --   MCV 85.5  --   MCH 29.8  --   MCHC 34.9  --    RDW 13.6  --   LYMPHSABS PENDING  --   MONOABS PENDING  --   EOSABS PENDING  --   BASOSABS PENDING  --     Chemistries   Recent Labs Lab 02/05/15 2202 02/05/15 2209  NA 141 143  K 3.8 3.6  CL 105 103  CO2 25  --   GLUCOSE 120* 116*  BUN 30* 32*  CREATININE 1.88* 1.80*  CALCIUM 9.8  --   AST 23  --   ALT 13*  --   ALKPHOS 98  --   BILITOT 0.3  --    ------------------------------------------------------------------------------------------------------------------ CrCl cannot be calculated (Unknown ideal weight.). ------------------------------------------------------------------------------------------------------------------ No results for input(s): HGBA1C in the last 72 hours. ------------------------------------------------------------------------------------------------------------------ No results for input(s): CHOL, HDL, LDLCALC, TRIG, CHOLHDL, LDLDIRECT in the last 72 hours. ------------------------------------------------------------------------------------------------------------------ No results for input(s): TSH, T4TOTAL, T3FREE, THYROIDAB in the last 72 hours.  Invalid input(s): FREET3 ------------------------------------------------------------------------------------------------------------------ No results for input(s): VITAMINB12, FOLATE, FERRITIN, TIBC, IRON, RETICCTPCT in the last 72 hours.  Coagulation profile  Recent Labs Lab 02/05/15 2202  INR 1.17    No results for input(s): DDIMER in the last 72 hours.  Cardiac Enzymes No results for input(s): CKMB, TROPONINI, MYOGLOBIN in the last 168 hours.  Invalid input(s): CK ------------------------------------------------------------------------------------------------------------------ Invalid input(s): POCBNP   CBG: No results for input(s): GLUCAP in the last 168 hours.     EKG: Independently reviewed.  Sinus rhythm with probable left atrial enlargement   Assessment/Plan    Acute  encephalopathy: Patient found with a rightward gaze nonresponsive incontinent of bowel and bladder. Question possibility of seizure versus stroke now thought to be less likely. Neurology recommended MRI  and that no further stroke workup needed unless found to be positive for acute stroke. - Admit to a telemetry bed  - Seizure precaution   - neurochecks every 2 hours  - Check UDS  - chest x-ray  - Follow-up MRI   Probable seizure:  Patient with previous history of generalized seizure back in December of 2015. - Check EEG - check CPK - keppra 500 mg twice a day - IV fluids normal saline  Leukocytosis WBC 11 on admission patient afebrile. Question possibility of this being reactive versus less likely infectious - Check CBC in a.m.  Acute kidney injury on chronic kidney disease stage III: Patient with baseline creatinine between 1.2 and 1.4 acutely elevated on admission at 1.8 with elevated BUN of 30. Suspect prerenal in nature. - IV fluids normal saline - Recheck BMP in the  P.m.  Hypertensive  urgency:  Systolic blood pressures greater than 200. There is signs of kidney damage  So  possibly could be classified as hypertensive emergency -   Hydralazine prn     Pharmacy will likely need to reconcile home meds .  Code Status:   full Family Communication: bedside Disposition Plan: admit   Total time spent 55 minutes.Greater than 50% of this time was spent in counseling, explanation of diagnosis, planning of further management, and coordination of care  [facility] Pager [phone]  If 7PM-7AM, please contact night-coverage [email or web address] Password [facility] 02/05/2015, 10:52 PM

## 2015-02-05 NOTE — ED Notes (Signed)
The pt arrived by [facility]  From home  Some type syncopal episode at home  Where she was incontinent of bowel and bladder.  Last seen normal approx 2030.  Rt sided gaze

## 2015-02-05 NOTE — Progress Notes (Signed)
Chaplain responded to page that pt in trauma C has family in ED waiting area.  Chaplain escorted family to consult room and informed staff, MD stated that family could come back to room.  Chaplain facilitated reuniting pt and granddaughter and provided nourishment to granddaughter.  Emotional support provided through supportive presence and empathetic listening.

## 2015-02-05 NOTE — Consult Note (Signed)
Admission H&P    Chief Complaint: Probable seizure.  HPI: [name] is an 75 y.o. female history of hypertension, hypercholesterolemia and rheumatoid arthritis, as well as an equivocal generalized seizure December 2015, thought to the emergency room and code stroke status following acute onset of staring with gaze tonically to the right side and not responding verbally. Vision was incontinent of stool. Her workup in December 2015 was unremarkable normal EEG. A pressure was elevated at the time, requiring acute intervention. The pressure again was elevated tonight at 207/113. CT scan of her head showed no acute intracranial abnormality. Patient became more responsive after arriving in the emergency room. She had no focal deficits. Gaze returned to normal. She had no dysarthria. Code stroke was canceled. He was given a loading dose of Keppra 1000 mg, and was also given 10 mg of hydralazine IV for initial management of hypertensive urgency.  Past Medical History  Diagnosis Date  . Hypertension   . Rheumatoid arthritis(714.0)   . Hypercholesterolemia   . Depression   . Headache   . Syncope and collapse   . Vision abnormalities     Past Surgical History  Procedure Laterality Date  . Abdominal hysterectomy    . Bilateral carpal tunnel repair    . Eye surgery      Family History  Problem Relation Age of Onset  . Cancer Father   . Prostate cancer Father   . Cancer Brother   . Heart disease Brother   . Miscarriages / [place] Brother   . Heart disease Mother   . Pneumonia Mother    Social History:  reports that she has been smoking Cigarettes.  She has a 60 pack-year smoking history. She has never used smokeless tobacco. She reports that she does not drink alcohol or use illicit drugs.  Allergies: No Known Allergies  Medications: Patient's preadmission medications were reviewed by me.  ROS: Unavailable due to patient's mental status.  Physical Examination: BP 196/105,  pulse 74/m.  HEENT-  Normocephalic, no lesions, without obvious abnormality.  Normal external eye and conjunctiva.  Normal TM's bilaterally.  Normal auditory canals and external ears. Normal external nose, mucus membranes and septum.  Normal pharynx. Neck supple with no masses, nodes, nodules or enlargement. Cardiovascular - regular rate and rhythm, S1, S2 normal, no murmur, click, rub or gallop Lungs - chest clear, no wheezing, rales, normal symmetric air entry Abdomen - soft, non-tender; bowel sounds normal; no masses,  no organomegaly Extremities - no joint deformities, effusion, or inflammation and no edema  Neurologic Examination: Mental Status: Lethargic but disoriented to time but oriented to place.  Speech fluent without evidence of aphasia. Able to follow commands without difficulty. Cranial Nerves: II-Visual fields were normal. III/IV/VI-Pupils were equal and reacted only to light. Extraocular movements were full and conjugate.    V/VII-no facial numbness and no facial weakness. VIII-normal. X-normal speech. XI: trapezius strength/neck flexion strength normal bilaterally XII-midline tongue extension with normal strength. Motor: 5/5 bilaterally with normal tone and bulk Sensory: Normal throughout. Deep Tendon Reflexes: 1+ and symmetric. Plantars: Flexor bilaterally Carotid auscultation: Normal  Results for orders placed or performed during the hospital encounter of 02/05/15 (from the past 48 hour(s))  I-stat troponin, ED (not at [facility], [facility])     Status: None   Collection Time: 02/05/15 10:07 PM  Result Value Ref Range   Troponin i, poc 0.00 0.00 - 0.08 ng/mL   Comment 3  Comment: Due to the release kinetics of cTnI, a negative result within the first hours of the onset of symptoms does not rule out myocardial infarction with certainty. If myocardial infarction is still suspected, repeat the test at appropriate intervals.   I-Stat Chem 8, ED  (not at [facility], [facility])      Status: Abnormal   Collection Time: 02/05/15 10:09 PM  Result Value Ref Range   Sodium 143 135 - 145 mmol/L   Potassium 3.6 3.5 - 5.1 mmol/L   Chloride 103 101 - 111 mmol/L   BUN 32 (H) 6 - 20 mg/dL   Creatinine, Ser 1.80 (H) 0.44 - 1.00 mg/dL   Glucose, Bld 116 (H) 65 - 99 mg/dL   Calcium, Ion 1.26 1.13 - 1.30 mmol/L   TCO2 25 0 - 100 mmol/L   Hemoglobin 15.6 (H) 12.0 - 15.0 g/dL   HCT 46.0 36.0 - 46.0 %   Ct Head Wo Contrast  02/05/2015  CLINICAL DATA:  Code stroke. Right-sided gaze and slurred speech. Initial encounter. EXAM: CT HEAD WITHOUT CONTRAST TECHNIQUE: Contiguous axial images were obtained from the base of the skull through the vertex without intravenous contrast. COMPARISON:  CT of the head performed 01/12/2014, and MRI of the brain performed 01/13/2014 FINDINGS: There is no evidence of acute infarction, mass lesion, or intra- or extra-axial hemorrhage on CT. Prominence of the ventricles and sulci reflects mild cortical volume loss. Mild cerebellar atrophy is noted. Scattered periventricular and subcortical white matter change likely reflects small vessel ischemic microangiopathy. Chronic lacunar infarcts are noted at the right basal ganglia and right thalamus. The brainstem and fourth ventricle are within normal limits. The cerebral hemispheres demonstrate grossly normal gray-white differentiation. No mass effect or midline shift is seen. There is no evidence of fracture; visualized osseous structures are unremarkable in appearance. The orbits are within normal limits. The paranasal sinuses and mastoid air cells are well-aerated. No significant soft tissue abnormalities are seen. IMPRESSION: 1. No acute intracranial pathology seen on CT. 2. Mild cortical volume loss and scattered small vessel ischemic microangiopathy. 3. 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 These results were called by telephone at the time of interpretation on 02/05/2015 at 10:09 pm  to Dr. Nicole Kindred, who verbally acknowledged these results. Electronically Signed   By: Garald Balding M.D.   On: 02/05/2015 22:10    Assessment/Plan 75 year old lady presenting with probable partial seizure, as well as strip probable generalized seizure 14 months ago. Patient is also presenting with hypertensive urgency, as was the case with a previous presentation. CT showed no acute intracranial abnormality. Exam shows no acute focal deficit neurologically. Acute stroke is unlikely but cannot be completely ruled out.  Recommendations: 1. Continue Keppra at 500 mg every 12 hours 2. MRI of the brain without and with contrast 3. EEG, routine adult study 4. No stroke workup is indicated if MRI shows no signs of an acute ischemic infarction 5. Management of hypertension from this point per ED physician and primary admitting team  We will continue to follow this patient with you.  C.R. Nicole Kindred, MD [facility] [phone]  02/05/2015, 10:31 PM

## 2015-02-05 NOTE — ED Notes (Signed)
Pt not c/o a headache when she arrived   Now she has a headache.  Dr Nicole Kindred has cancelled the code stroke.  Seizure  He has seen her previously  For a seizure  Alert talking to family at the bedside

## 2015-02-06 ENCOUNTER — Observation Stay (HOSPITAL_COMMUNITY): Payer: Medicare Other

## 2015-02-06 ENCOUNTER — Encounter (HOSPITAL_COMMUNITY): Payer: Self-pay | Admitting: Internal Medicine

## 2015-02-06 DIAGNOSIS — D72829 Elevated white blood cell count, unspecified: Secondary | ICD-10-CM | POA: Diagnosis present

## 2015-02-06 DIAGNOSIS — N179 Acute kidney failure, unspecified: Secondary | ICD-10-CM

## 2015-02-06 DIAGNOSIS — N189 Chronic kidney disease, unspecified: Secondary | ICD-10-CM | POA: Diagnosis present

## 2015-02-06 DIAGNOSIS — G9340 Encephalopathy, unspecified: Secondary | ICD-10-CM | POA: Diagnosis not present

## 2015-02-06 DIAGNOSIS — I160 Hypertensive urgency: Secondary | ICD-10-CM | POA: Diagnosis not present

## 2015-02-06 DIAGNOSIS — R569 Unspecified convulsions: Secondary | ICD-10-CM | POA: Diagnosis not present

## 2015-02-06 HISTORY — DX: Acute kidney failure, unspecified: N18.9

## 2015-02-06 HISTORY — DX: Chronic kidney disease, unspecified: N17.9

## 2015-02-06 LAB — URINALYSIS, ROUTINE W REFLEX MICROSCOPIC
Bilirubin Urine: NEGATIVE
Glucose, UA: NEGATIVE mg/dL
Ketones, ur: NEGATIVE mg/dL
LEUKOCYTES UA: NEGATIVE
NITRITE: NEGATIVE
PROTEIN: NEGATIVE mg/dL
Specific Gravity, Urine: 1.008 (ref 1.005–1.030)
pH: 7 (ref 5.0–8.0)

## 2015-02-06 LAB — RAPID URINE DRUG SCREEN, HOSP PERFORMED
Amphetamines: NOT DETECTED
BARBITURATES: NOT DETECTED
Benzodiazepines: NOT DETECTED
Cocaine: NOT DETECTED
Opiates: NOT DETECTED
TETRAHYDROCANNABINOL: NOT DETECTED

## 2015-02-06 LAB — LIPID PANEL
CHOL/HDL RATIO: 4.3 ratio
Cholesterol: 165 mg/dL (ref 0–200)
HDL: 38 mg/dL — AB (ref >40)
LDL CALC: 104 mg/dL — AB (ref 0–99)
Triglycerides: 113 mg/dL (ref <150)
VLDL: 23 mg/dL (ref 0–40)

## 2015-02-06 LAB — URINE MICROSCOPIC-ADD ON: WBC UA: NONE SEEN WBC/hpf (ref 0–5)

## 2015-02-06 LAB — CBG MONITORING, ED: GLUCOSE-CAPILLARY: 98 mg/dL (ref 65–99)

## 2015-02-06 LAB — CK: Total CK: 291 U/L — ABNORMAL HIGH (ref 38–234)

## 2015-02-06 MED ORDER — SODIUM CHLORIDE 0.9 % IV SOLN: INTRAVENOUS | Status: DC

## 2015-02-06 MED ORDER — STROKE: EARLY STAGES OF RECOVERY BOOK
Freq: Once | Status: AC
  Administered 2015-02-06: 20:00:00
  Filled 2015-02-06: qty 1

## 2015-02-06 MED ORDER — SODIUM CHLORIDE 0.9 % IV SOLN
500.0000 mg | Freq: Two times a day (BID) | INTRAVENOUS | Status: DC
  Administered 2015-02-06: 500 mg via INTRAVENOUS
  Filled 2015-02-06 (×3): qty 5

## 2015-02-06 MED ORDER — SENNOSIDES-DOCUSATE SODIUM 8.6-50 MG PO TABS: 1.0000 | ORAL_TABLET | Freq: Every evening | ORAL | Status: DC | PRN

## 2015-02-06 MED ORDER — ENOXAPARIN SODIUM 40 MG/0.4ML ~~LOC~~ SOLN
40.0000 mg | SUBCUTANEOUS | Status: DC
  Administered 2015-02-06: 40 mg via SUBCUTANEOUS
  Filled 2015-02-06: qty 0.4

## 2015-02-06 MED ORDER — LEVETIRACETAM 500 MG PO TABS
500.0000 mg | ORAL_TABLET | Freq: Two times a day (BID) | ORAL | Status: DC
  Administered 2015-02-06: 500 mg via ORAL
  Filled 2015-02-06: qty 1

## 2015-02-06 MED ORDER — HYDRALAZINE HCL 20 MG/ML IJ SOLN: 10.0000 mg | INTRAMUSCULAR | Status: DC | PRN

## 2015-02-06 MED ORDER — SODIUM CHLORIDE 0.9 % IV SOLN
INTRAVENOUS | Status: DC
  Administered 2015-02-06 (×2): via INTRAVENOUS

## 2015-02-06 NOTE — ED Notes (Signed)
The pt returned from [facility].  Pt alert/

## 2015-02-06 NOTE — Procedures (Signed)
EEG report.  Brief clinical history: 75 y/o with altered mental status.   Technique: this is a 17 channel routine scalp EEG performed at the bedside with bipolar and monopolar montages arranged in accordance to the international 10/20 system of electrode placement. One channel was dedicated to EKG recording.  The study was performed during wakefulness, drowsiness, and stage 2 sleep. No activating procedures performed.  Description: as the study begins and during the first half of the study, there is predominately diffuse and continuous theta frequencies in the 6-7 Hz range, but towards the last portion of the study this is superseded by normal sleep architecture, with stage 2 sleep showing symmetric and synchronous sleep spindles without intermixed epileptiform discharges.  No focal or generalized epileptiform discharges noted.  No pathologic areas of slowing seen.  EKG showed sinus rhythm.  Impression: this is an abnormal EEG that is consistent with global cerebral dysfunction as seen in a wide variety of non specific encephalopathies. No electrographic seizures noted. Clinical correlation is advised.   Dorian Pod, MD

## 2015-02-06 NOTE — ED Notes (Signed)
Pt acting more alert.  Moving her into a warmer room

## 2015-02-06 NOTE — Progress Notes (Signed)
EEG completed; results pending.    

## 2015-02-06 NOTE — Progress Notes (Signed)
Code Stroke called on 74 y.o female. History of hypertension, hypercholesterolemia and rheumatoid arthritis, as well as an equivocal generalized seizure December 2015. Per EMS she was in her usual state of health when at 2120 she had acute onset of staring with gaze to right side not responding verbally, episode of stool and urine incontinence. Upon arrival originally taken to trauma room STAT in preparation for acute intubation as Code Stroke was paged out as unresponsive. However Pt responsive and talking, although confused, no respiratory compromise. Taken to CT scan STAT. Reviewed per Neurologist as negative. Upon arrival back to ED room Pt awake with mild confusion, and no further deficits. NIHSS 1. CBG 98. Code Stroke canceled per Dr.Stewart. For admit per Neurology for seizures. Loading dose Keppra ordered and hydralazine IV ordered for HTN. Pt will receive MRI tonight to rule out stroke.

## 2015-02-06 NOTE — ED Notes (Signed)
Patient transported to MRI 

## 2015-02-06 NOTE — ED Notes (Signed)
EEG at the bedside  ?

## 2015-02-06 NOTE — ED Provider Notes (Signed)
CSN: [id number]     Arrival date & time 02/05/15  2155 History   First MD Initiated Contact with Patient 02/05/15 2209     Chief Complaint  Patient presents with  . Code Stroke    @EDPCLEARED @ (Consider location/radiation/quality/duration/timing/severity/associated sxs/prior Treatment) HPI   75 year old female with past medical history of hyperlipidemia, headaches, syncope, seizures, who comes in today with an episode of decreased responsiveness. Patient was witnessed by her family to have episode of sudden onset decreased responsiveness and right-sided gaze preference. No tonic-clonic movements reported. With EMS she was initially minimally responsive, but gradually became more responsive on the way to the hospital.  Past Medical History  Diagnosis Date  . Hypertension   . Rheumatoid arthritis(714.0)   . Hypercholesterolemia   . Depression   . Headache   . Syncope and collapse   . Vision abnormalities   . Seizure ([place]) 02/05/2015   Past Surgical History  Procedure Laterality Date  . Abdominal hysterectomy    . Bilateral carpal tunnel repair    . Eye surgery     Family History  Problem Relation Age of Onset  . Cancer Father   . Prostate cancer Father   . Cancer Brother   . Heart disease Brother   . Miscarriages / [place] Brother   . Heart disease Mother   . Pneumonia Mother    Social History  Substance Use Topics  . Smoking status: Current Every Day Smoker -- 1.00 packs/day for 60 years    Types: Cigarettes  . Smokeless tobacco: Never Used  . Alcohol Use: No   OB History    No data available     Review of Systems  Constitutional: Negative for fever and chills.  HENT: Negative for nosebleeds.   Eyes: Negative for visual disturbance.  Respiratory: Negative for cough and shortness of breath.   Cardiovascular: Negative for chest pain.  Gastrointestinal: Negative for nausea, vomiting, abdominal pain, diarrhea and constipation.  Genitourinary: Negative for  dysuria.  Skin: Negative for rash.  Neurological: Positive for headaches. Negative for weakness.  All other systems reviewed and are negative.     Allergies  Review of patient's allergies indicates no known allergies.  Home Medications   Prior to Admission medications   Medication Sig Start Date End Date Taking? Authorizing Provider  acetaminophen (TYLENOL) 325 MG tablet Take 2 tablets (650 mg total) by mouth every 6 (six) hours as needed for mild pain (or Fever >/= 101). 01/14/14   Delfina Redwood, MD  aspirin 81 MG tablet Take 81 mg by mouth daily.      Historical Provider, MD  atorvastatin (LIPITOR) 20 MG tablet Take 20 mg by mouth daily.    Historical Provider, MD  buPROPion (WELLBUTRIN SR) 150 MG 12 hr tablet Take 150 mg by mouth 2 (two) times daily.     Historical Provider, MD  carvedilol (COREG) 6.25 MG tablet  07/11/14   Historical Provider, MD  Cholecalciferol (VITAMIN D3) 50000 UNITS CAPS Take 1 capsule by mouth once a week. Take on Sunday morning    Historical Provider, MD  HYDROcodone-acetaminophen (NORCO/VICODIN) 5-325 MG per tablet  01/22/14   Historical Provider, MD  hydroxychloroquine (PLAQUENIL) 200 MG tablet Take 100-200 mg by mouth daily. Take a whole tablet in the morning and half tablet in the evening    Historical Provider, MD  metoprolol succinate (TOPROL XL) 50 MG 24 hr tablet Take 1 tablet (50 mg total) by mouth daily. Take with or immediately following a meal.  01/14/14   Delfina Redwood, MD  ondansetron (ZOFRAN) 4 MG tablet Take 1 tablet (4 mg total) by mouth every 6 (six) hours as needed for nausea. 01/14/14   Delfina Redwood, MD  sulfaSALAzine (AZULFIDINE) 500 MG tablet Take 500 mg by mouth 2 (two) times daily.    Historical Provider, MD  traMADol (ULTRAM) 50 MG tablet  11/07/13   Historical Provider, MD   BP 154/79 mmHg  Pulse 87  Resp 15  SpO2 96% Physical Exam  Constitutional: No distress.  HENT:  Head: Normocephalic and atraumatic.  Eyes: EOM  are normal. Pupils are equal, round, and reactive to light.  Neck: Normal range of motion. Neck supple.  Cardiovascular: Normal rate and intact distal pulses.   Pulmonary/Chest: No respiratory distress.  Abdominal: Soft. There is no tenderness.  Musculoskeletal: Normal range of motion.  Neurological: She is alert. She has normal strength. No cranial nerve deficit or sensory deficit.  disoriented to time but oriented to place  Skin: No rash noted. She is not diaphoretic.  Psychiatric: She has a normal mood and affect.    ED Course  Procedures (including critical care time) Labs Review Labs Reviewed  CBC - Abnormal; Notable for the following:    WBC 11.0 (*)    All other components within normal limits  DIFFERENTIAL - Abnormal; Notable for the following:    Monocytes Absolute 1.2 (*)    All other components within normal limits  COMPREHENSIVE METABOLIC PANEL - Abnormal; Notable for the following:    Glucose, Bld 120 (*)    BUN 30 (*)    Creatinine, Ser 1.88 (*)    Albumin 3.4 (*)    ALT 13 (*)    GFR calc non Af Amer 25 (*)    GFR calc Af Amer 29 (*)    All other components within normal limits  URINALYSIS, ROUTINE W REFLEX MICROSCOPIC (NOT AT [facility]) - Abnormal; Notable for the following:    APPearance CLOUDY (*)    Hgb urine dipstick TRACE (*)    All other components within normal limits  URINE MICROSCOPIC-ADD ON - Abnormal; Notable for the following:    Squamous Epithelial / LPF 0-5 (*)    Bacteria, UA MANY (*)    Casts GRANULAR CAST (*)    All other components within normal limits  I-STAT CHEM 8, ED - Abnormal; Notable for the following:    BUN 32 (*)    Creatinine, Ser 1.80 (*)    Glucose, Bld 116 (*)    Hemoglobin 15.6 (*)    All other components within normal limits  ETHANOL  PROTIME-INR  APTT  URINE RAPID DRUG SCREEN, HOSP PERFORMED  I-STAT TROPOININ, ED  CBG MONITORING, ED    Imaging Review Ct Head Wo Contrast  02/05/2015  CLINICAL DATA:  Code stroke.  Right-sided gaze and slurred speech. Initial encounter. EXAM: CT HEAD WITHOUT CONTRAST TECHNIQUE: Contiguous axial images were obtained from the base of the skull through the vertex without intravenous contrast. COMPARISON:  CT of the head performed 01/12/2014, and MRI of the brain performed 01/13/2014 FINDINGS: There is no evidence of acute infarction, mass lesion, or intra- or extra-axial hemorrhage on CT. Prominence of the ventricles and sulci reflects mild cortical volume loss. Mild cerebellar atrophy is noted. Scattered periventricular and subcortical white matter change likely reflects small vessel ischemic microangiopathy. Chronic lacunar infarcts are noted at the right basal ganglia and right thalamus. The brainstem and fourth ventricle are within normal limits. The cerebral  hemispheres demonstrate grossly normal gray-white differentiation. No mass effect or midline shift is seen. There is no evidence of fracture; visualized osseous structures are unremarkable in appearance. The orbits are within normal limits. The paranasal sinuses and mastoid air cells are well-aerated. No significant soft tissue abnormalities are seen. IMPRESSION: 1. No acute intracranial pathology seen on CT. 2. Mild cortical volume loss and scattered small vessel ischemic microangiopathy. 3. 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 These results were called by telephone at the time of interpretation on 02/05/2015 at 10:09 pm to Dr. Nicole Kindred, who verbally acknowledged these results. Electronically Signed   By: Garald Balding M.D.   On: 02/05/2015 22:10   I have personally reviewed and evaluated these images and lab results as part of my medical decision-making.   EKG Interpretation   Date/Time:  Friday February 05 2015 22:10:14 EST Ventricular Rate:  78 PR Interval:  155 QRS Duration: 79 QT Interval:  388 QTC Calculation: 442 R Axis:   66 Text Interpretation:  Sinus rhythm Probable left atrial  enlargement Left  ventricular hypertrophy no acute ischemia No significant change since last  tracing Confirmed by Gerald Leitz ([id number]) on 02/05/2015 11:49:15 PM      MDM   Final diagnoses:  Acute encephalopathy    75 year old female with past medical history of hyperlipidemia, headaches, syncope, seizures, who comes in today with an episode of decreased responsiveness  Exam as above unremarkable she seems to be becoming more alert. Neurology was at bedside on patient's arrival to the emergency department, and felt that this likely seizure activity rather than CVA. Code stroke was canceled. Patient was taken to the CT scanner CT head was unremarkable. Basic labs including UA which was unremarkable, CBC unremarkable, chemistry unremarkable, ethanol negative. Neurology recommended loading with Keppra and admission with keppra 500 mg bid. Arrangements made for hospitalist admission    Jarome Matin, MD 02/06/15 [place], MD 02/06/15 2333

## 2015-02-06 NOTE — Progress Notes (Signed)
PROGRESS NOTE  [name] [id number] DOB: May 07, 1940 DOA: 02/05/2015 PCP: Maximino Greenland, MD Brief History 75 year old female with a history of hypertension, rheumatoid arthritis, hyperlipidemia presented with mental status change. According to the patient's daughter, the patient had a right gaze preference around 9:20 PM on 02/05/2015. The patient was not verbally responding at that time I was noted to be incontinent of bowel and bladder.EMS was activated. In the emergency department, the patient was conversant but remained a little confused.  Neurology was consulted. 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 Right-sided gaze and slurred speech. Initial encounter. EXAM: CT HEAD WITHOUT CONTRAST TECHNIQUE: Contiguous axial images were obtained from the base of the skull through the vertex without intravenous contrast. COMPARISON:  CT of the head performed 01/12/2014, and MRI of the brain performed 01/13/2014 FINDINGS: There is no evidence of acute infarction, mass lesion, or intra- or extra-axial hemorrhage on CT. Prominence of the ventricles and sulci reflects mild cortical  volume loss. Mild cerebellar atrophy is noted. Scattered periventricular and subcortical white matter change likely reflects small vessel ischemic microangiopathy. Chronic lacunar infarcts are noted at the right basal ganglia and right thalamus. The brainstem and fourth ventricle are within normal limits. The cerebral hemispheres demonstrate grossly normal gray-white differentiation. No mass effect or midline shift is seen. There is no evidence of fracture; visualized osseous structures are unremarkable in appearance. The orbits are within normal limits. The paranasal sinuses and mastoid air cells are well-aerated. No significant soft tissue abnormalities are seen. IMPRESSION: 1. No acute intracranial pathology seen on CT. 2. Mild cortical volume loss and scattered small vessel ischemic microangiopathy. 3. 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 These results were called by telephone at the time of interpretation on 02/05/2015 at 10:09 pm to Dr. Nicole Kindred, who verbally acknowledged these results. Electronically Signed   By: Garald Balding M.D.   On: 02/05/2015 22:10   Mr Brain Wo Contrast  02/06/2015  CLINICAL DATA:  Initial evaluation for acute encephalopathy. EXAM: MRI HEAD WITHOUT CONTRAST TECHNIQUE: Multiplanar, multiecho pulse sequences of the brain and surrounding structures were obtained without intravenous contrast. COMPARISON:  Prior CT from 02/05/2015. FINDINGS: Diffuse prominence of the CSF containing spaces is compatible with advanced cerebral atrophy. Patchy and confluent T2/FLAIR hyperintensity within the periventricular and deep white matter is most consistent with chronic small vessel ischemic changes. Chronic small vessel changes present within the central pons as well. Remote lacunar infarcts present within the bilateral basal ganglia and thalami. Probable additional remote infarct with near the left middle cerebellar peduncle. Hippocampi are symmetric in size with  normal signal intensity and morphology. No abnormal foci of restricted diffusion to suggest acute intracranial infarct. Major intracranial vascular  flow voids are maintained. No acute or chronic intracranial hemorrhage. No mass lesion, midline shift, or mass effect. Ventricular prominence related to global parenchymal volume loss present without hydrocephalus. No extra-axial fluid collection. Craniocervical junction within normal limits. Scattered multilevel degenerative spondylolysis noted within the upper cervical spine without significant stenosis. Pituitary gland within normal limits. No acute abnormality about the orbits. Sequela prior bilateral lens extraction noted. Scattered mucosal thickening within the ethmoidal air cells. Paranasal sinuses are otherwise largely clear. Trace opacity within the left mastoid air cells. Inner ear structures normal. Bone marrow signal intensity within normal limits. No acute scalp soft tissue abnormality. IMPRESSION: 1. No acute intracranial infarct or other process identified. 2. Advanced cerebral atrophy with moderate chronic small vessel ischemic disease and multiple remote lacunar infarcts as above. Electronically Signed   By: Jeannine Boga M.D.   On: 02/06/2015 02:12   Dg Chest Port 1 View  02/06/2015  CLINICAL DATA:  74 year old female with acute encephalopathy EXAM: PORTABLE CHEST 1 VIEW COMPARISON:  Radiograph dated 01/12/2014 FINDINGS: The heart size and mediastinal contours are within normal limits. Both lungs are clear. The visualized skeletal structures are unremarkable. IMPRESSION: No active disease. Electronically Signed   By: Anner Crete M.D.   On: 02/06/2015 01:40         Subjective: Patient denies fevers, chills, headache, chest pain, dyspnea, nausea, vomiting, diarrhea, abdominal pain, dysuria, hematuria   Objective: Filed Vitals:   02/06/15 1015 02/06/15 1059 02/06/15 1325 02/06/15 1707  BP: 112/68 124/56 125/60 139/69  Pulse:  74 72 66 81  Temp:  98 F (36.7 C) 98 F (36.7 C) 98.5 F (36.9 C)  TempSrc:  Oral Oral Oral  Resp: 16 17 18 18   SpO2: 95% 97% 99% 95%    Intake/Output Summary (Last 24 hours) at 02/06/15 1756 Last data filed at 02/06/15 0641  Gross per 24 hour  Intake    225 ml  Output    600 ml  Net   -375 ml   Weight change:  Exam:   General:  Pt is alert, follows commands appropriately, not in acute distress  HEENT: No icterus, No thrush, No neck mass, [place]/AT  Cardiovascular: RRR, S1/S2, no rubs, no gallops  Respiratory: CTA bilaterally, no wheezing, no crackles, no rhonchi  Abdomen: Soft/+BS, non tender, non distended, no guarding  Extremities: No edema, No lymphangitis, No petechiae, No rashes, no synovitis  Data Reviewed: Basic Metabolic Panel:  Recent Labs Lab 02/05/15 2202 02/05/15 2209  NA 141 143  K 3.8 3.6  CL 105 103  CO2 25  --   GLUCOSE 120* 116*  BUN 30* 32*  CREATININE 1.88* 1.80*  CALCIUM 9.8  --    Liver Function Tests:  Recent Labs Lab 02/05/15 2202  AST 23  ALT 13*  ALKPHOS 98  BILITOT 0.3  PROT 7.3  ALBUMIN 3.4*   No results for input(s): LIPASE, AMYLASE in the last 168 hours. No results for input(s): AMMONIA in the last 168 hours. CBC:  Recent Labs Lab 02/05/15 2202 02/05/15 2209  WBC 11.0*  --   NEUTROABS 6.1  --   HGB 14.4 15.6*  HCT 41.3 46.0  MCV 85.5  --   PLT 325  --    Cardiac Enzymes:  Recent Labs Lab 02/06/15 0434  CKTOTAL 291*   BNP: Invalid input(s): POCBNP CBG:  Recent Labs Lab 02/05/15 2215  GLUCAP 98    No results found for this or any previous visit (from the past 240  hour(s)).   Scheduled Meds: .  stroke: mapping our early stages of recovery book   Does not apply Once  . enoxaparin (LOVENOX) injection  40 mg Subcutaneous Q24H  . hydrALAZINE  10 mg Intravenous Once  . levETIRAcetam  500 mg Oral BID   Continuous Infusions: . sodium chloride    . sodium chloride 75 mL/hr at 02/06/15 0742      Alexes Menchaca, DO  [facility] Pager [phone]  If 7PM-7AM, please contact night-coverage [email or web address] Password [facility] 02/06/2015, 5:56 PM

## 2015-02-06 NOTE — ED Notes (Signed)
Pt trying to get out of bed to use the restroom..  The pt is  No longer cold.  She is sweating somewhat

## 2015-02-06 NOTE — Progress Notes (Signed)
Pt arrived to 5M18 @ 1058, Pt A&Ox 4, c/o pain 0/10.  Pt VS taken, Fluids running. Pt without distress. Family at the bedside. Diet ordered, will monitor.

## 2015-02-06 NOTE — ED Notes (Signed)
The pt went to [facility]

## 2015-02-06 NOTE — Progress Notes (Signed)
NEURO HOSPITALIST PROGRESS NOTE   SUBJECTIVE:                                                                                                                        Offers no neurological complains. Wife at the bedside said that she is " still mildly confused but improving". MRI brain was personally reviewed and showed no acute abnormality. EEG consistent with global cerebral dysfunction, no electrographic seizures or epileptiform discharges noted. Started on low dose keppra.  OBJECTIVE:                                                                                                                           Vital signs in last 24 hours: Temp:  [98 F (36.7 C)-100.4 F (38 C)] 98 F (36.7 C) (01/21 1059) Pulse Rate:  [72-111] 72 (01/21 1059) Resp:  [15-24] 17 (01/21 1059) BP: (110-198)/(56-133) 124/56 mmHg (01/21 1059) SpO2:  [92 %-100 %] 97 % (01/21 1059)  Intake/Output from previous day: 01/20 0701 - 01/21 0700 In: 225 [P.O.:75; I.V.:150] Out: 600 [Urine:600] Intake/Output this shift:   Nutritional status: Diet Heart Room service appropriate?: Yes; Fluid consistency:: Thin  Past Medical History  Diagnosis Date  . Hypertension   . Rheumatoid arthritis(714.0)   . Hypercholesterolemia   . Depression   . Headache   . Syncope and collapse   . Vision abnormalities   . Seizure ([place]) 02/05/2015  . Acute kidney injury superimposed on chronic kidney disease ([place]) 02/06/2015   Physical Examination: HEENT- Normocephalic, no lesions, without obvious abnormality. Normal external eye and conjunctiva. Normal TM's bilaterally. Normal auditory canals and external ears. Normal external nose, mucus membranes and septum. Normal pharynx. Neck supple with no masses, nodes, nodules or enlargement. Cardiovascular - regular rate and rhythm, S1, S2 normal, no murmur, click, rub or gallop Lungs - chest clear, no wheezing, rales, normal symmetric air  entry Abdomen - soft, non-tender; bowel sounds normal; no masses, no organomegaly Extremities - no joint deformities, effusion, or inflammation and no edema  Neurologic Exam:  Mental Status: Alert and awake, oriented to place-year-month. Speech fluent without evidence of aphasia. Able to follow commands without difficulty. Cranial Nerves: II-Visual fields were normal. III/IV/VI-Pupils were equal and reacted only to  light. Extraocular movements were full and conjugate.  V/VII-no facial numbness and no facial weakness. VIII-normal. X-normal speech. XI: trapezius strength/neck flexion strength normal bilaterally XII-midline tongue extension with normal strength. Motor: 5/5 bilaterally with normal tone and bulk Sensory: Normal throughout. Deep Tendon Reflexes: 1+ and symmetric. Plantars: Flexor bilaterally  Lab Results: Lab Results  Component Value Date/Time   CHOL 165 02/06/2015 07:46 AM   Lipid Panel  Recent Labs  02/06/15 0746  CHOL 165  TRIG 113  HDL 38*  CHOLHDL 4.3  VLDL 23  LDLCALC 104*    Studies/Results: Ct Head Wo Contrast  02/05/2015  CLINICAL DATA:  Code stroke. Right-sided gaze and slurred speech. Initial encounter. EXAM: CT HEAD WITHOUT CONTRAST TECHNIQUE: Contiguous axial images were obtained from the base of the skull through the vertex without intravenous contrast. COMPARISON:  CT of the head performed 01/12/2014, and MRI of the brain performed 01/13/2014 FINDINGS: There is no evidence of acute infarction, mass lesion, or intra- or extra-axial hemorrhage on CT. Prominence of the ventricles and sulci reflects mild cortical volume loss. Mild cerebellar atrophy is noted. Scattered periventricular and subcortical white matter change likely reflects small vessel ischemic microangiopathy. Chronic lacunar infarcts are noted at the right basal ganglia and right thalamus. The brainstem and fourth ventricle are within normal limits. The cerebral hemispheres demonstrate  grossly normal gray-white differentiation. No mass effect or midline shift is seen. There is no evidence of fracture; visualized osseous structures are unremarkable in appearance. The orbits are within normal limits. The paranasal sinuses and mastoid air cells are well-aerated. No significant soft tissue abnormalities are seen. IMPRESSION: 1. No acute intracranial pathology seen on CT. 2. Mild cortical volume loss and scattered small vessel ischemic microangiopathy. 3. 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 These results were called by telephone at the time of interpretation on 02/05/2015 at 10:09 pm to Dr. Nicole Kindred, who verbally acknowledged these results. Electronically Signed   By: Garald Balding M.D.   On: 02/05/2015 22:10   Mr Brain Wo Contrast  02/06/2015  CLINICAL DATA:  Initial evaluation for acute encephalopathy. EXAM: MRI HEAD WITHOUT CONTRAST TECHNIQUE: Multiplanar, multiecho pulse sequences of the brain and surrounding structures were obtained without intravenous contrast. COMPARISON:  Prior CT from 02/05/2015. FINDINGS: Diffuse prominence of the CSF containing spaces is compatible with advanced cerebral atrophy. Patchy and confluent T2/FLAIR hyperintensity within the periventricular and deep white matter is most consistent with chronic small vessel ischemic changes. Chronic small vessel changes present within the central pons as well. Remote lacunar infarcts present within the bilateral basal ganglia and thalami. Probable additional remote infarct with near the left middle cerebellar peduncle. Hippocampi are symmetric in size with normal signal intensity and morphology. No abnormal foci of restricted diffusion to suggest acute intracranial infarct. Major intracranial vascular flow voids are maintained. No acute or chronic intracranial hemorrhage. No mass lesion, midline shift, or mass effect. Ventricular prominence related to global parenchymal volume loss present  without hydrocephalus. No extra-axial fluid collection. Craniocervical junction within normal limits. Scattered multilevel degenerative spondylolysis noted within the upper cervical spine without significant stenosis. Pituitary gland within normal limits. No acute abnormality about the orbits. Sequela prior bilateral lens extraction noted. Scattered mucosal thickening within the ethmoidal air cells. Paranasal sinuses are otherwise largely clear. Trace opacity within the left mastoid air cells. Inner ear structures normal. Bone marrow signal intensity within normal limits. No acute scalp soft tissue abnormality. IMPRESSION: 1. No acute intracranial infarct or other process  identified. 2. Advanced cerebral atrophy with moderate chronic small vessel ischemic disease and multiple remote lacunar infarcts as above. Electronically Signed   By: Jeannine Boga M.D.   On: 02/06/2015 02:12   Dg Chest Port 1 View  02/06/2015  CLINICAL DATA:  75 year old female with acute encephalopathy EXAM: PORTABLE CHEST 1 VIEW COMPARISON:  Radiograph dated 01/12/2014 FINDINGS: The heart size and mediastinal contours are within normal limits. Both lungs are clear. The visualized skeletal structures are unremarkable. IMPRESSION: No active disease. Electronically Signed   By: Anner Crete M.D.   On: 02/06/2015 01:40    MEDICATIONS                                                                                                                        Scheduled: .  stroke: mapping our early stages of recovery book   Does not apply Once  . enoxaparin (LOVENOX) injection  40 mg Subcutaneous Q24H  . hydrALAZINE  10 mg Intravenous Once  . levETIRAcetam  500 mg Intravenous Q12H    ASSESSMENT/PLAN:                                                                                                            75 year old lady presenting with probable partial seizure. Had probable generalized seizure 14 months ago. Patient is also  presenting with hypertensive urgency, as was the case with a previous presentation. MRI brain showed no acute intracranial abnormality. EEG consistent with global cerebral dysfunction, likely post ictal. Neuroxam shows no acute focal deficit and her mental status is rapidly improving. Likely partial seizure, suggest continuing keppra 500 mg BID. May need 48 ambulatory EEG as outpatient. No further inpatient neurological intervention needed at this time. Will sign off.  Dorian Pod, MD [facility] [phone]  02/06/2015, 11:40 AM

## 2015-02-06 NOTE — ED Notes (Signed)
Pt pass ed her swallow screen 

## 2015-02-07 DIAGNOSIS — R569 Unspecified convulsions: Secondary | ICD-10-CM | POA: Diagnosis not present

## 2015-02-07 DIAGNOSIS — G9340 Encephalopathy, unspecified: Secondary | ICD-10-CM | POA: Diagnosis not present

## 2015-02-07 DIAGNOSIS — N179 Acute kidney failure, unspecified: Secondary | ICD-10-CM | POA: Diagnosis not present

## 2015-02-07 DIAGNOSIS — I160 Hypertensive urgency: Secondary | ICD-10-CM | POA: Diagnosis not present

## 2015-02-07 LAB — BASIC METABOLIC PANEL
Anion gap: 5 (ref 5–15)
BUN: 24 mg/dL — ABNORMAL HIGH (ref 6–20)
CALCIUM: 9.5 mg/dL (ref 8.9–10.3)
CO2: 25 mmol/L (ref 22–32)
CREATININE: 1.45 mg/dL — AB (ref 0.44–1.00)
Chloride: 114 mmol/L — ABNORMAL HIGH (ref 101–111)
GFR, EST AFRICAN AMERICAN: 39 mL/min — AB (ref >60)
GFR, EST NON AFRICAN AMERICAN: 34 mL/min — AB (ref >60)
Glucose, Bld: 70 mg/dL (ref 65–99)
Potassium: 4 mmol/L (ref 3.5–5.1)
SODIUM: 144 mmol/L (ref 135–145)

## 2015-02-07 LAB — AMMONIA: Ammonia: 23 umol/L (ref 9–35)

## 2015-02-07 LAB — CBC
HCT: 37.4 % (ref 36.0–46.0)
Hemoglobin: 12.7 g/dL (ref 12.0–15.0)
MCH: 29.7 pg (ref 26.0–34.0)
MCHC: 34 g/dL (ref 30.0–36.0)
MCV: 87.4 fL (ref 78.0–100.0)
PLATELETS: 280 10*3/uL (ref 150–400)
RBC: 4.28 MIL/uL (ref 3.87–5.11)
RDW: 13.8 % (ref 11.5–15.5)
WBC: 8.9 10*3/uL (ref 4.0–10.5)

## 2015-02-07 LAB — TSH: TSH: 0.152 u[IU]/mL — ABNORMAL LOW (ref 0.350–4.500)

## 2015-02-07 LAB — VITAMIN B12: VITAMIN B 12: 326 pg/mL (ref 180–914)

## 2015-02-07 MED ORDER — LEVETIRACETAM 500 MG PO TABS: 500.0000 mg | ORAL_TABLET | Freq: Two times a day (BID) | ORAL | Status: DC

## 2015-02-07 NOTE — Evaluation (Signed)
Physical Therapy Evaluation Patient Details Name: [name] MRN: [id number] DOB: 1940/03/30 Today's Date: 02/07/2015   History of Present Illness    75 year old female with a past medical history significant for HTN, HLD, RA, and history of syncope/ seizure in December 2015; who presents after having some reported episode of around 2120 of staring gaze to the right side and not responding verbally. Patient who is normally continent was also noted to be incontinent of bowel and bladder during this time. EMS was called and brought the patient into the emergency room at that time she was somewhat responsive and talking although confused. Her initial blood pressure was seen to be significantly elevated with systolic blood pressure greater than 200. She was evaluated with stat CT scan which showed no acute abnormalities. Neurology evaluated her and hearing and examining patient thought more likely seizure and recommended MRI of the brain and EEG. Patient reporting complaints of a headache and does not recall events leading up to coming into the hospital. Her daughter states that she was on the phone with her around 52 and she was relatively normal at that time. Patient was not on any AED medications.    Clinical Impression  Pt presents at/near baseline functional level, has 24 care at d/c and all DME needs.  Discharge from hospital pending, and pt ready to d/c home.  Offered HHPT to assist with improving gait and balance, however pt declines.  No further needs. Thank you,     Follow Up Recommendations No PT follow up (offered HHPT for general rehab, pt declines)    Equipment Recommendations  None recommended by PT    Recommendations for Other Services       Precautions / Restrictions Precautions Precautions: Fall Precaution Comments: uses cane      Mobility  Bed Mobility Overal bed mobility: Modified Independent                Transfers Overall transfer level: Needs  assistance Equipment used: 1 person hand held assist Transfers: Sit to/from Stand Sit to Stand: Min assist         General transfer comment: to assist with liftoff from low surfaces; pt usually uses cane  Ambulation/Gait Ambulation/Gait assistance: Min assist Ambulation Distance (Feet): 20 Feet Assistive device: 1 person hand held assist Gait Pattern/deviations: Step-through pattern;Decreased stride length;Trunk flexed     General Gait Details: slow, cautious; normally uses cane, only hand held this session; pt feels this is her baseline  Science writer    Modified Rankin (Stroke Patients Only)       Balance Overall balance assessment: Needs assistance Sitting-balance support: No upper extremity supported;Feet supported Sitting balance-Leahy Scale: Good     Standing balance support: Single extremity supported;During functional activity Standing balance-Leahy Scale: Fair                               Pertinent Vitals/Pain Pain Assessment: No/denies pain    Home Living Family/patient expects to be discharged to:: Private residence Living Arrangements: Spouse/significant other;Other relatives Available Help at Discharge: Family;Available 24 hours/day Type of Home: House Home Access: Level entry     Home Layout: One level Home Equipment: Cane - single point Additional Comments: granddaughter stays days (spouse nights) to "babysit" per pt    Prior Function Level of Independence: Independent with assistive device(s)  Hand Dominance        Extremity/Trunk Assessment   Upper Extremity Assessment: Defer to OT evaluation           Lower Extremity Assessment: Overall WFL for tasks assessed      Cervical / Trunk Assessment: Normal  Communication   Communication: No difficulties  Cognition Arousal/Alertness: Awake/alert Behavior During Therapy: WFL for tasks assessed/performed Overall  Cognitive Status: Within Functional Limits for tasks assessed                      General Comments      Exercises        Assessment/Plan    PT Assessment Patent does not need any further PT services  PT Diagnosis Difficulty walking   PT Problem List    PT Treatment Interventions     PT Goals (Current goals can be found in the Care Plan section) Acute Rehab PT Goals PT Goal Formulation: All assessment and education complete, DC therapy    Frequency     Barriers to discharge        Co-evaluation               End of Session Equipment Utilized During Treatment: Gait belt Activity Tolerance: Patient tolerated treatment well Patient left: in bed;with call bell/phone within reach;with family/visitor present Nurse Communication: Mobility status    Functional Assessment Tool Used: clinical judgment Functional Limitation: Mobility: Walking and moving around Mobility: Walking and Moving Around Current Status [id number]): At least 40 percent but less than 60 percent impaired, limited or restricted Mobility: Walking and Moving Around Goal Status [phone]): At least 40 percent but less than 60 percent impaired, limited or restricted Mobility: Walking and Moving Around Discharge Status [phone]): At least 40 percent but less than 60 percent impaired, limited or restricted    Time: 1221-1248 PT Time Calculation (min) (ACUTE ONLY): 27 min   Charges:   PT Evaluation $PT Eval Low Complexity: 1 Procedure     PT G Codes:   PT G-Codes **NOT FOR INPATIENT CLASS** Functional Assessment Tool Used: clinical judgment Functional Limitation: Mobility: Walking and moving around Mobility: Walking and Moving Around Current Status [id number]): At least 40 percent but less than 60 percent impaired, limited or restricted Mobility: Walking and Moving Around Goal Status [phone]): At least 40 percent but less than 60 percent impaired, limited or restricted Mobility: Walking and Moving Around  Discharge Status [phone]): At least 40 percent but less than 60 percent impaired, limited or restricted    Herbie Drape 02/07/2015, 1:06 PM

## 2015-02-07 NOTE — Progress Notes (Signed)
Discharge orders received, Pt for discharge home today. IV d/c'd. D/c instructions and RX given with verbalized understanding. Family at bedside to assist patient with discharge. Staff bought pt downstairs via wheelchair. 1535 02/07/15

## 2015-02-07 NOTE — Evaluation (Signed)
Occupational Therapy Evaluation Patient Details Name: [name] MRN: [id number] DOB: Apr 27, 1940 Today's Date: 02/07/2015    History of Present Illness 75 year old female with a past medical history significant for HTN, HLD, RA, and history of syncope/ seizure in December 2015; who presents after having some reported episode of around 2120 of staring gaze to the right side and not responding verbally. Patient who is normally continent was also noted to be incontinent of bowel and bladder during this time. EMS was called and brought the patient into the emergency room at that time she was somewhat responsive and talking although confused. Her initial blood pressure was seen to be significantly elevated with systolic blood pressure greater than 200. She was evaluated with stat CT scan which showed no acute abnormalities. Neurology evaluated her and hearing and examining patient thought more likely seizure and recommended MRI of the brain and EEG. Patient reporting complaints of a headache and does not recall events leading up to coming into the hospital. Her daughter states that she was on the phone with her around 72 and she was relatively normal at that time. Patient was not on any AED medications.75 year old female with a past medical history significant for HTN, HLD, RA, and history of syncope/ seizure in December 2015; who presents after having some reported episode of around 2120 of staring gaze to the right side and not responding verbally. Patient who is normally continent was also noted to be incontinent of bowel and bladder during this time. EMS was called and brought the patient into the emergency room at that time she was somewhat responsive and talking although confused. Her initial blood pressure was seen to be significantly elevated with systolic blood pressure greater than 200. She was evaluated with stat CT scan which showed no acute abnormalities. Neurology evaluated her and hearing  and examining patient thought more likely seizure and recommended MRI of the brain and EEG. Patient reporting complaints of a headache and does not recall events leading up to coming into the hospital. Her daughter states that she was on the phone with her around 62 and she was relatively normal at that time. Patient was not on any AED medications.   Clinical Impression   Pt. And husband states that pt. Is at baseline for sit to stand and ADLs. Pt. Would benefit from 3-1 commode and husband states they are going to purchase one. Pt. Is d/c home today and was advised to have family to continue to A with transfers.     Follow Up Recommendations  No OT follow up    Equipment Recommendations  3 in 1 bedside comode    Recommendations for Other Services       Precautions / Restrictions Precautions Precautions: Fall Precaution Comments: uses cane      Mobility Bed Mobility Overal bed mobility: Modified Independent                Transfers Overall transfer level: Needs assistance Equipment used: Rolling walker (2 wheeled) Transfers: Stand Pivot Transfers Sit to Stand: Min assist         General transfer comment: to assist with liftoff from low surfaces; pt usually uses cane    Balance Overall balance assessment: Needs assistance Sitting-balance support: No upper extremity supported;Feet supported Sitting balance-Leahy Scale: Good     Standing balance support: Single extremity supported;During functional activity Standing balance-Leahy Scale: Fair  ADL Overall ADL's : At baseline                                       General ADL Comments:  (Pt. needs Min A with sit to stand from commode.)     Vision     Perception     Praxis      Pertinent Vitals/Pain Pain Assessment: No/denies pain     Hand Dominance     Extremity/Trunk Assessment Upper Extremity Assessment Upper Extremity Assessment: RUE  deficits/detail;LUE deficits/detail RUE Deficits / Details:  (Pt. has RA and has decreased SHLD flex 80 degrees.) LUE Deficits / Details:  (Pt. has RA and has decreased SHLD flex 80 degrees.)   Lower Extremity Assessment Lower Extremity Assessment: Overall WFL for tasks assessed   Cervical / Trunk Assessment Cervical / Trunk Assessment: Normal   Communication Communication Communication: No difficulties   Cognition Arousal/Alertness: Awake/alert Behavior During Therapy: WFL for tasks assessed/performed Overall Cognitive Status: Within Functional Limits for tasks assessed                     General Comments       Exercises       Shoulder Instructions      Home Living Family/patient expects to be discharged to:: Private residence Living Arrangements: Spouse/significant other;Other relatives Available Help at Discharge: Family;Available 24 hours/day Type of Home: House Home Access: Level entry     Home Layout: One level     Bathroom Shower/Tub: Tub/shower unit Shower/tub characteristics: Architectural technologist: Standard     Home Equipment: Cane - single point   Additional Comments: granddaughter stays days (spouse nights) to "babysit" per pt      Prior Functioning/Environment Level of Independence: Independent with assistive device(s)  Gait / Transfers Assistance Needed:  (Per pt. she needed A with sit to stand from low commode.) ADL's / Homemaking Assistance Needed:  (Pt. states she was S with transfer into shower.)        OT Diagnosis:     OT Problem List:     OT Treatment/Interventions:      OT Goals(Current goals can be found in the care plan section) Acute Rehab OT Goals Patient Stated Goal:  (go home)  OT Frequency:     Barriers to D/C:            Co-evaluation              End of Session Equipment Utilized During Treatment: Rolling walker  Activity Tolerance: Patient tolerated treatment well Patient left: in bed;with call  bell/phone within reach;with family/visitor present   Time: 1335-1407 OT Time Calculation (min): 32 min Charges:  OT General Charges $OT Visit: 1 Procedure OT Evaluation $OT Eval Low Complexity: 1 Procedure OT Treatments $Self Care/Home Management : 8-22 mins G-Codes: OT G-codes **NOT FOR INPATIENT CLASS** Functional Limitation: Self care Self Care Current Status [id number]): At least 1 percent but less than 20 percent impaired, limited or restricted Self Care Goal Status [id number]): At least 1 percent but less than 20 percent impaired, limited or restricted Self Care Discharge Status [phone]): At least 1 percent but less than 20 percent impaired, limited or restricted  Amarah Brossman 02/07/2015, 2:10 PM

## 2015-02-07 NOTE — Discharge Summary (Signed)
Physician Discharge Summary  [name] [id number] DOB: 01/04/1941 DOA: 02/05/2015  PCP: Maximino Greenland, MD  Admit date: 02/05/2015 Discharge date: 02/07/2015  Recommendations for Outpatient Follow-up:  1. Pt will need to follow up with PCP in 2 weeks post discharge 2. Please obtain BMP in one week 3. 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   Specialty:  Neurology   Contact information:   [place]  [id number] [phone]     home  Diet:regular Wt Readings from Last 3 Encounters:  02/07/15 56.7 kg (125 lb)  07/30/14 50.894 kg (112 lb 3.2 oz)  02/02/14 53.343 kg (117 lb 9.6 oz)    History of present illness:  75 year old female with a history of hypertension, rheumatoid arthritis, hyperlipidemia presented with mental status change. According to the patient's daughter, the patient had a right gaze preference around 9:20 PM on 02/05/2015. The patient was not verbally responding at that time she was noted to be incontinent of bowel and  bladder.EMS was activated. In the emergency department, the patient was conversant but remained a little confused. Neurology was consulted.  EEG did not show epileptiform discharges.  MRI brain was negative for acute findings.  IVF were started.  The patient's mental status returned to baseline.  She improved clinically.  She was discharged to f/u with pcp.  Recommend recheck TSH in one month when pt is stable.  Discharge Exam: Filed Vitals:   02/07/15 0606 02/07/15 0901  BP: 145/105 155/74  Pulse: 76 69  Temp: 97.9 F (36.6 C) 98.3 F (36.8 C)  Resp: 18 20   Filed Vitals:   02/07/15 0144 02/07/15 0606 02/07/15 0655 02/07/15 0901  BP: 177/68 145/105  155/74  Pulse: 98 76  69  Temp: 97.7 F (36.5 C) 97.9 F (36.6 C)  98.3 F (36.8 C)  TempSrc: Oral Oral  Oral  Resp: 18 18  20   Height:   5\' 1"  (1.549 m)   Weight:   56.7 kg (125 lb)   SpO2: 100% 100%  100%   General: A&O x 3, NAD, pleasant, cooperative Cardiovascular: RRR, no rub, no gallop, no S3 Respiratory: CTAB, no wheeze, no rhonchi Abdomen:soft, nontender, nondistended, positive bowel sounds Extremities: No edema, No lymphangitis, no petechiae  Discharge Instructions      Discharge Instructions    Diet - low sodium heart healthy    Complete by:  As directed      Increase activity slowly    Complete by:  As directed             Medication List    TAKE these medications        acetaminophen 325 MG tablet  Commonly known as:  TYLENOL  Take 2 tablets (650 mg total) by mouth every 6 (six) hours as needed for mild pain (or Fever >/= 101).     aspirin 81 MG tablet  Take 81 mg by mouth daily.     atorvastatin 20 MG tablet  Commonly known as:  LIPITOR  Take 20 mg by mouth daily.     carvedilol 6.25 MG tablet  Commonly known as:  COREG  Take 1 tablet by mouth every evening.     hydroxychloroquine 200 MG tablet  Commonly known as:  PLAQUENIL  Take 100-200 mg by mouth daily. Take a whole tablet in the morning  and half tablet in the evening     levETIRAcetam 500 MG tablet  Commonly known as:  KEPPRA  Take 1 tablet (500 mg total) by mouth 2 (two) times daily.         The results of significant diagnostics from this hospitalization (including imaging, microbiology, ancillary and laboratory) are listed below for reference.    Significant Diagnostic Studies: Ct Head Wo Contrast  02/05/2015  CLINICAL DATA:  Code stroke. Right-sided gaze and slurred speech. Initial encounter. EXAM: CT HEAD WITHOUT CONTRAST TECHNIQUE: Contiguous axial images were obtained from the base of the skull through the vertex without intravenous contrast. COMPARISON:  CT of the head performed 01/12/2014, and MRI of the brain performed 01/13/2014 FINDINGS: There is no evidence of acute infarction, mass lesion, or intra- or extra-axial hemorrhage on CT. Prominence of the ventricles and sulci reflects mild cortical volume loss. Mild cerebellar atrophy is noted. Scattered periventricular and subcortical white matter change likely reflects small vessel ischemic microangiopathy. Chronic lacunar infarcts are noted at the right basal ganglia and right thalamus. The brainstem and fourth ventricle are within normal limits. The cerebral hemispheres demonstrate grossly normal gray-white differentiation. No mass effect or midline shift is seen. There is no evidence of fracture; visualized osseous structures are unremarkable in appearance. The orbits are within normal limits. The paranasal sinuses and mastoid air cells are well-aerated. No significant soft tissue abnormalities are seen. IMPRESSION: 1. No acute intracranial pathology seen on CT. 2. Mild cortical volume loss and scattered small vessel ischemic microangiopathy. 3. 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 These results were called by telephone at the time of interpretation on 02/05/2015 at 10:09 pm to Dr. Nicole Kindred, who verbally acknowledged these results.  Electronically Signed   By: Garald Balding M.D.   On: 02/05/2015 22:10   Mr Brain Wo Contrast  02/06/2015  CLINICAL DATA:  Initial evaluation for acute encephalopathy. EXAM: MRI HEAD WITHOUT CONTRAST TECHNIQUE: Multiplanar, multiecho pulse sequences of the brain and surrounding structures were obtained without intravenous contrast. COMPARISON:  Prior CT from 02/05/2015. FINDINGS: Diffuse prominence of the CSF containing spaces is compatible with advanced cerebral atrophy. Patchy and confluent T2/FLAIR hyperintensity within the periventricular and deep white matter is most consistent with chronic small vessel ischemic changes. Chronic small vessel changes present within the central pons as well. Remote lacunar infarcts present within the bilateral basal ganglia and thalami. Probable additional remote infarct with near the left middle cerebellar peduncle. Hippocampi are symmetric in size with normal signal intensity and morphology. No abnormal foci of restricted diffusion to suggest acute intracranial infarct. Major intracranial vascular flow voids are maintained. No acute or chronic intracranial hemorrhage. No mass lesion, midline shift, or mass effect. Ventricular prominence related to global parenchymal volume loss present without hydrocephalus. No extra-axial fluid collection. Craniocervical junction within normal limits. Scattered multilevel  degenerative spondylolysis noted within the upper cervical spine without significant stenosis. Pituitary gland within normal limits. No acute abnormality about the orbits. Sequela prior bilateral lens extraction noted. Scattered mucosal thickening within the ethmoidal air cells. Paranasal sinuses are otherwise largely clear. Trace opacity within the left mastoid air cells. Inner ear structures normal. Bone marrow signal intensity within normal limits. No acute scalp soft tissue abnormality. IMPRESSION: 1. No acute intracranial infarct or other process identified. 2.  Advanced cerebral atrophy with moderate chronic small vessel ischemic disease and multiple remote lacunar infarcts as above. Electronically Signed   By: Jeannine Boga M.D.   On: 02/06/2015 02:12   Dg Chest Port 1 View  02/06/2015  CLINICAL DATA:  75 year old female with acute encephalopathy EXAM: PORTABLE CHEST 1 VIEW COMPARISON:  Radiograph dated 01/12/2014 FINDINGS: The heart size and mediastinal contours are within normal limits. Both lungs are clear. The visualized skeletal structures are unremarkable. IMPRESSION: No active disease. Electronically Signed   By: Anner Crete M.D.   On: 02/06/2015 01:40     Microbiology: No results found for this or any previous visit (from the past 240 hour(s)).   Labs: Basic Metabolic Panel:  Recent Labs Lab 02/05/15 2202 02/05/15 2209 02/07/15 0337  NA 141 143 144  K 3.8 3.6 4.0  CL 105 103 114*  CO2 25  --  25  GLUCOSE 120* 116* 70  BUN 30* 32* 24*  CREATININE 1.88* 1.80* 1.45*  CALCIUM 9.8  --  9.5   Liver Function Tests:  Recent Labs Lab 02/05/15 2202  AST 23  ALT 13*  ALKPHOS 98  BILITOT 0.3  PROT 7.3  ALBUMIN 3.4*   No results for input(s): LIPASE, AMYLASE in the last 168 hours.  Recent Labs Lab 02/07/15 0337  AMMONIA 23   CBC:  Recent Labs Lab 02/05/15 2202 02/05/15 2209 02/07/15 0337  WBC 11.0*  --  8.9  NEUTROABS 6.1  --   --   HGB 14.4 15.6* 12.7  HCT 41.3 46.0 37.4  MCV 85.5  --  87.4  PLT 325  --  280   Cardiac Enzymes:  Recent Labs Lab 02/06/15 0434  CKTOTAL 291*   BNP: Invalid input(s): POCBNP CBG:  Recent Labs Lab 02/05/15 2215  GLUCAP 98    Time coordinating discharge:  Greater than 30 minutes  Signed:  Athziry Millican, DO [facility] Pager: [phone] 02/07/2015, 11:54 AM

## 2015-02-08 LAB — HEMOGLOBIN A1C
Hgb A1c MFr Bld: 5.5 % (ref 4.8–5.6)
MEAN PLASMA GLUCOSE: 111 mg/dL

## 2015-03-24 ENCOUNTER — Ambulatory Visit (INDEPENDENT_AMBULATORY_CARE_PROVIDER_SITE_OTHER): Payer: Medicare Other | Admitting: Neurology

## 2015-03-24 ENCOUNTER — Encounter: Payer: Self-pay | Admitting: Neurology

## 2015-03-24 VITALS — BP 130/88 | HR 82 | Resp 20 | Ht 61.0 in | Wt 112.0 lb

## 2015-03-24 DIAGNOSIS — M542 Cervicalgia: Secondary | ICD-10-CM | POA: Insufficient documentation

## 2015-03-24 DIAGNOSIS — M609 Myositis, unspecified: Secondary | ICD-10-CM

## 2015-03-24 DIAGNOSIS — R4020 Unspecified coma: Secondary | ICD-10-CM

## 2015-03-24 DIAGNOSIS — I160 Hypertensive urgency: Secondary | ICD-10-CM

## 2015-03-24 DIAGNOSIS — R569 Unspecified convulsions: Secondary | ICD-10-CM

## 2015-03-24 DIAGNOSIS — M069 Rheumatoid arthritis, unspecified: Secondary | ICD-10-CM

## 2015-03-24 DIAGNOSIS — M791 Myalgia: Secondary | ICD-10-CM | POA: Diagnosis not present

## 2015-03-24 DIAGNOSIS — IMO0001 Reserved for inherently not codable concepts without codable children: Secondary | ICD-10-CM

## 2015-03-24 DIAGNOSIS — R930 Abnormal findings on diagnostic imaging of skull and head, not elsewhere classified: Secondary | ICD-10-CM | POA: Diagnosis not present

## 2015-03-24 DIAGNOSIS — R404 Transient alteration of awareness: Secondary | ICD-10-CM

## 2015-03-24 DIAGNOSIS — R269 Unspecified abnormalities of gait and mobility: Secondary | ICD-10-CM | POA: Diagnosis not present

## 2015-03-24 MED ORDER — LEVETIRACETAM 500 MG PO TABS: 1000.0000 mg | ORAL_TABLET | Freq: Every day | ORAL | Status: DC

## 2015-03-24 NOTE — Progress Notes (Signed)
[facility]  PATIENT: [name] DOB: November 28, 1940  REFERRING CLINICIAN: Glendale Chard HISTORY FROM: Patient and grandduaghter REASON FOR VISIT: Syncope and confusion   HISTORICAL  CHIEF COMPLAINT:  Chief Complaint  Patient presents with  . Loss of Consciousness    Sts. on 02-05-15 she eating at  home, and the next thing she remembers is waking up in the hospital.  Sts. husband told her she passed out.  She was admitted to [facility] where mri brain was neg., EEG was neg., TSH was 0.152.  She was started on Keppra 53m bid and told to f/u with Dr. SFelecia Shelling  She sts. she doesn't usually take 2nd dose of Keppra--only takes it once./fim    HISTORY OF PRESENT ILLNESS:  Becky Bidwellis a 75year old woman with episodes of syncope.  In late January, she was on a barstool and was leaning back in the chair.   She passed out and her husband helped her to the floor.  She had another episode identical to this one while sitting in the same chair last year.     She went to the ER and did not 'come to' until about 30-60 minutes later.    However, she was very groggy and in ER, staff would waken her up and then she would fall right back asleep.    She was back to herself the next day.      She did not have any GTC activity with either event.      [facility] showed atrophy and moderate small vessel ischemic disease but no acute findings.   EEG showed generalized slowing but no epileptiform activity.      She had mildly elevated crt, BUN.   Other labs were mostly normal, BP was very elevated at 198/133.     She was placed on Keppra and takes bid.   Because she does not like to be on medications, she is taking just qd instead of daily.    Keppra is her only 2 x daily medication.   She has not had other episodes of syncope/seizure since the event 6 weeks ago.     She is very tired and has remained tired since the episode.    She has delayed phase sleep  disorder sleeping between 5 am and noon most days.    She has had left arm weakness and right arm pain since December or January, before the syncope.   She has a little neck pain.    Headaches:   She still gets some headaches, often  her left eye. No visual changes She gets photophobia and phonophobia.   Tylenol and sleep help.  Moving increases the pain.  She used to have frequent migraines when younger but they came back about 6 month ago.     ESR was 28 in December 2015.   Chewing or talking does not worsen headache.     Data:  I reviewed hospital notes, labs, imaging reports and reviewed actual MRI images.    MRI of the brain (personally reviewed) 02/04/2014 shows generalized cortical atrophy and moderate small vessel ischemic change with confluency is noted near the occipital and frontal horns of the lateral ventricles. No acute findings. EEG was unremarkable.     REVIEW OF SYSTEMS:  Constitutional: No fevers, chills, sweats, or change in appetite Eyes: No visual changes, double vision, eye pain Ear, nose and throat: No hearing loss, ear pain, nasal congestion, sore  throat Cardiovascular: No chest pain, palpitations Respiratory:  No shortness of breath at rest or with exertion.   No wheezes GastrointestinaI: No nausea, vomiting, diarrhea, abdominal pain, fecal incontinence Genitourinary:  she has had some incontinence.   She has not had any since she left the hospital. Musculoskeletal:  She has RA with pain in hands and other joints Integumentary: No rash, pruritus, skin lesions Neurological: as above Psychiatric: No depression at this time.  No anxiety Endocrine: No palpitations, diaphoresis, change in appetite, change in weigh or increased thirst Hematologic/Lymphatic:  No anemia, purpura, petechiae. Allergic/Immunologic: No itchy/runny eyes, nasal congestion, recent allergic reactions, rashes  ALLERGIES: No Known Allergies  HOME MEDICATIONS: Outpatient Prescriptions Prior to Visit   Medication Sig Dispense Refill  . acetaminophen (TYLENOL) 325 MG tablet Take 2 tablets (650 mg total) by mouth every 6 (six) hours as needed for mild pain (or Fever >/= 101).    [name] aspirin 81 MG tablet Take 81 mg by mouth daily.      . carvedilol (COREG) 6.25 MG tablet Take 1 tablet by mouth every evening.  0  . hydroxychloroquine (PLAQUENIL) 200 MG tablet Take 100-200 mg by mouth daily. Take a whole tablet in the morning and half tablet in the evening    . atorvastatin (LIPITOR) 20 MG tablet Take 20 mg by mouth daily. Reported on 03/24/2015    . levETIRAcetam (KEPPRA) 500 MG tablet Take 1 tablet (500 mg total) by mouth 2 (two) times daily. 60 tablet 1   No facility-administered medications prior to visit.    PAST MEDICAL HISTORY: Past Medical History  Diagnosis Date  . Hypertension   . Rheumatoid arthritis(714.0)   . Hypercholesterolemia   . Depression   . Headache   . Syncope and collapse   . Vision abnormalities   . Seizure ([place]) 02/05/2015  . Acute kidney injury superimposed on chronic kidney disease ([place]) 02/06/2015    PAST SURGICAL HISTORY: Past Surgical History  Procedure Laterality Date  . Abdominal hysterectomy    . Bilateral carpal tunnel repair    . Eye surgery      FAMILY HISTORY: Family History  Problem Relation Age of Onset  . Cancer Father   . Prostate cancer Father   . Cancer Brother   . Heart disease Brother   . Miscarriages / [place] Brother   . Heart disease Mother   . Pneumonia Mother     SOCIAL HISTORY:  Social History   Social History  . Marital Status: Married    Spouse Name: N/A  . Number of Children: N/A  . Years of Education: N/A   Occupational History  . Not on file.   Social History Main Topics  . Smoking status: Current Every Day Smoker -- 1.00 packs/day for 60 years    Types: Cigarettes  . Smokeless tobacco: Never Used  . Alcohol Use: No  . Drug Use: No  . Sexual Activity: Not on file   Other Topics Concern  . Not on  file   Social History Narrative     PHYSICAL EXAM  Filed Vitals:   03/24/15 1331  BP: 130/88  Pulse: 82  Resp: 20  Height: '5\' 1"'  (1.549 m)  Weight: 112 lb (50.803 kg)    Body mass index is 21.17 kg/(m^2).   General: The patient is well-developed and well-nourished and in no acute distress  Neck is nontender  Skin: Extremities are without significant edema.  Musculoskeletal:   Tender over neck,upper chest, upper back, shoulders.  Neurologic Exam  Mental status: The patient is alert and oriented x 3 at the time of the examination. The patient has good recent and remote memory (3/3), with a reduced attention span and concentration ability (WORLD- DRLOW).     Cranial nerves: Extraocular movements are full. Pupils are equal, round, and reactive to light and accomodation.  Facial symmetry is present. There is good facial sensation to soft touch bilaterally.Facial strength is normal.  Trapezius and sternocleidomastoid strength is normal. No dysarthria is noted.  The tongue is midline, and the patient has symmetric elevation of the soft palate. No obvious hearing deficits are noted.  Motor:  Muscle bulk and tone are normal. Strength is  4 / 5 proximally in legs and 5/5 right arm but 4/5 left triceps and extensors.   .   Sensory: Sensory testing is intact to pinprick, soft touch, vibration sensation, and position sense on all 4 extremities.  Coordination: Cerebellar testing reveals good finger-nose-finger and bilaterally.  Gait and station: Needed help rising from chair.  Station is stable and gait has short stride and 6 step turn.   Romberg is negative.   Reflexes: Deep tendon reflexes are symmetric and normal in arms and increased with spread at knees bilaterally.     DIAGNOSTIC DATA (LABS, IMAGING, TESTING) - I reviewed patient records, labs, notes, testing and imaging myself where available.  Lab Results  Component Value Date   WBC 8.9 02/07/2015   HGB 12.7 02/07/2015     HCT 37.4 02/07/2015   MCV 87.4 02/07/2015   PLT 280 02/07/2015      Component Value Date/Time   NA 144 02/07/2015 0337   K 4.0 02/07/2015 0337   CL 114* 02/07/2015 0337   CO2 25 02/07/2015 0337   GLUCOSE 70 02/07/2015 0337   BUN 24* 02/07/2015 0337   CREATININE 1.45* 02/07/2015 0337   CALCIUM 9.5 02/07/2015 0337   PROT 7.3 02/05/2015 2202   ALBUMIN 3.4* 02/05/2015 2202   AST 23 02/05/2015 2202   ALT 13* 02/05/2015 2202   ALKPHOS 98 02/05/2015 2202   BILITOT 0.3 02/05/2015 2202   GFRNONAA 34* 02/07/2015 0337   GFRAA 39* 02/07/2015 0337   Lab Results  Component Value Date   CHOL 165 02/06/2015   HDL 38* 02/06/2015   LDLCALC 104* 02/06/2015   TRIG 113 02/06/2015   CHOLHDL 4.3 02/06/2015   Lab Results  Component Value Date   HGBA1C 5.5 02/06/2015   Lab Results  Component Value Date   VITAMINB12 326 02/07/2015   Lab Results  Component Value Date   TSH 0.152* 02/07/2015      ASSESSMENT AND PLAN  LOC (loss of consciousness) - Plan: Sedimentation rate  Abnormal MRI of head - Plan: Sedimentation rate  Seizure ([place])  Hypertensive urgency, malignant  Rheumatoid arthritis of hip, unspecified laterality, unspecified rheumatoid factor presence (HCC)  Gait disturbance - Plan: MR Cervical Spine Wo Contrast, CK  Neck pain - Plan: Sedimentation rate, MR Cervical Spine Wo Contrast  Myalgia and myositis     1.    Event likely seizure as she was not to baseline > 1 day later. Possibly due to hypoertensivve urgency.    Change Keppra to 1000 mg daily so she is able to get a larger dose in.    2.    Due to poor gait and neck/arm pain and arm weakness, check cervical spine MRI 3.    Because of muscle aches and proximal weakness, check ESR to r/o PMR and  CK.   rtc 6 months for re-evaluation.   Call sooner if new or worsening neurologic symptoms  40 minute face to face with > 1/2 the time coumseling and coordinating care.    Richard A. Felecia Shelling, MD, PhD 03/21/3315, 4:09  PM Certified in Neurology, Clinical Neurophysiology, Sleep Medicine, Pain Medicine and Neuroimaging  [facility] [address] [place], [place] [address] [phone]

## 2015-03-25 ENCOUNTER — Telehealth: Payer: Self-pay | Admitting: *Deleted

## 2015-03-25 LAB — SEDIMENTATION RATE: SED RATE: 40 mm/h (ref 0–40)

## 2015-03-25 LAB — CK: CK TOTAL: 46 U/L (ref 24–173)

## 2015-03-25 NOTE — Telephone Encounter (Signed)
PC with Apolonio Schneiders and per RAS, advised that labs are normal; will call with mri results once it is done.  She verbalized understanding of same/fim

## 2015-03-25 NOTE — Telephone Encounter (Signed)
-----   Message from Britt Bottom, MD sent at 03/25/2015  8:38 AM EST ----- Plan of the lab work was normal. We will call her back again after the cervical spine MRI.

## 2015-04-05 ENCOUNTER — Ambulatory Visit
Admission: RE | Admit: 2015-04-05 | Discharge: 2015-04-05 | Disposition: A | Discharge status: Home / Self Care | Payer: Medicare Other | Source: Ambulatory Visit | Attending: Neurology | Admitting: Neurology

## 2015-04-05 DIAGNOSIS — R269 Unspecified abnormalities of gait and mobility: Secondary | ICD-10-CM

## 2015-04-05 DIAGNOSIS — M542 Cervicalgia: Secondary | ICD-10-CM | POA: Diagnosis not present

## 2015-04-12 ENCOUNTER — Telehealth: Payer: Self-pay | Admitting: *Deleted

## 2015-04-12 NOTE — Telephone Encounter (Signed)
-----   Message from Britt Bottom, MD sent at 04/06/2015  6:04 PM EDT ----- Please let her know that the MRI of the cervical spine showed mild arthritis but bad enough to treat. The spinal cord looked good.

## 2015-04-12 NOTE — Telephone Encounter (Signed)
I have spoken with [name], and per RAS, advised that mri of neck showed mild arthritis, but not bad enough to treat, and the spinal cord itself looked good.  She verbalized understanding of same/fim

## 2015-06-07 ENCOUNTER — Other Ambulatory Visit (HOSPITAL_COMMUNITY): Payer: Self-pay | Admitting: Internal Medicine

## 2015-06-07 DIAGNOSIS — E213 Hyperparathyroidism, unspecified: Secondary | ICD-10-CM

## 2015-06-17 ENCOUNTER — Ambulatory Visit (HOSPITAL_COMMUNITY): Admission: RE | Admit: 2015-06-17 | Payer: Medicare Other | Source: Ambulatory Visit

## 2015-06-17 ENCOUNTER — Ambulatory Visit (HOSPITAL_COMMUNITY): Payer: Medicare Other

## 2015-06-17 ENCOUNTER — Ambulatory Visit (HOSPITAL_COMMUNITY)
Admission: RE | Admit: 2015-06-17 | Discharge: 2015-06-17 | Disposition: A | Discharge status: Home / Self Care | Payer: Medicare Other | Source: Ambulatory Visit | Attending: Internal Medicine | Admitting: Internal Medicine

## 2015-06-17 DIAGNOSIS — R938 Abnormal findings on diagnostic imaging of other specified body structures: Secondary | ICD-10-CM | POA: Diagnosis not present

## 2015-06-17 DIAGNOSIS — E213 Hyperparathyroidism, unspecified: Secondary | ICD-10-CM | POA: Diagnosis present

## 2015-06-17 MED ORDER — TECHNETIUM TC 99M SESTAMIBI - CARDIOLITE
26.4000 | Freq: Once | INTRAVENOUS | Status: AC | PRN
  Administered 2015-06-17: 08:00:00 26 via INTRAVENOUS

## 2015-07-06 ENCOUNTER — Ambulatory Visit: Payer: Self-pay | Admitting: Surgery

## 2015-07-12 NOTE — Pre-Procedure Instructions (Signed)
[name]  07/12/2015     Your procedure is scheduled on : Friday July 16, 2015 at 12:00 PM.  Report to [facility] Admitting at 10:00 AM.  Call this number if you have problems the morning of surgery: [phone]    Remember:  Do not eat food or drink liquids after midnight.  Take these medicines the morning of surgery with A SIP OF WATER : Acetaminophen (Tylenol) if needed, Levetiracetam (Keppra)   Stop taking any vitamins, herbal medications/supplements, NSAIDs, Ibuprofen, Advil, Motrin, Aleve, etc today    Do not wear jewelry, make-up or nail polish.  Do not wear lotions, powders, or perfumes.   Do not shave 48 hours prior to surgery.    Do not bring valuables to the hospital.  [facility] is not responsible for any belongings or valuables.  Contacts, dentures or bridgework may not be worn into surgery.  Leave your suitcase in the car.  After surgery it may be brought to your room.  For patients admitted to the hospital, discharge time will be determined by your treatment team.  Patients discharged the day of surgery will not be allowed to drive home.   Name and phone number of your driver:    Special instructions:  Shower using CHG soap the night before and the morning of your surgery  Please read over the following fact sheets that you were given.

## 2015-07-13 ENCOUNTER — Encounter (HOSPITAL_COMMUNITY)
Admission: RE | Admit: 2015-07-13 | Discharge: 2015-07-13 | Disposition: A | Discharge status: Home / Self Care | Payer: Medicare Other | Source: Ambulatory Visit | Attending: Surgery | Admitting: Surgery

## 2015-07-13 ENCOUNTER — Encounter (HOSPITAL_COMMUNITY): Payer: Self-pay

## 2015-07-13 DIAGNOSIS — E210 Primary hyperparathyroidism: Secondary | ICD-10-CM | POA: Diagnosis not present

## 2015-07-13 DIAGNOSIS — F17200 Nicotine dependence, unspecified, uncomplicated: Secondary | ICD-10-CM | POA: Diagnosis not present

## 2015-07-13 HISTORY — DX: Repeated falls: R29.6

## 2015-07-13 HISTORY — DX: Pneumonia, unspecified organism: J18.9

## 2015-07-13 LAB — BASIC METABOLIC PANEL
ANION GAP: 4 — AB (ref 5–15)
BUN: 17 mg/dL (ref 6–20)
CALCIUM: 10.5 mg/dL — AB (ref 8.9–10.3)
CO2: 20 mmol/L — AB (ref 22–32)
CREATININE: 1.24 mg/dL — AB (ref 0.44–1.00)
Chloride: 114 mmol/L — ABNORMAL HIGH (ref 101–111)
GFR calc Af Amer: 48 mL/min — ABNORMAL LOW (ref >60)
GFR calc non Af Amer: 41 mL/min — ABNORMAL LOW (ref >60)
GLUCOSE: 127 mg/dL — AB (ref 65–99)
Potassium: 4.3 mmol/L (ref 3.5–5.1)
Sodium: 138 mmol/L (ref 135–145)

## 2015-07-13 LAB — CBC
HEMATOCRIT: 41.5 % (ref 36.0–46.0)
Hemoglobin: 13.7 g/dL (ref 12.0–15.0)
MCH: 28.1 pg (ref 26.0–34.0)
MCHC: 33 g/dL (ref 30.0–36.0)
MCV: 85.2 fL (ref 78.0–100.0)
Platelets: 338 10*3/uL (ref 150–400)
RBC: 4.87 MIL/uL (ref 3.87–5.11)
RDW: 13.8 % (ref 11.5–15.5)
WBC: 8.9 10*3/uL (ref 4.0–10.5)

## 2015-07-13 MED ORDER — CHLORHEXIDINE GLUCONATE CLOTH 2 % EX PADS: 6.0000 | MEDICATED_PAD | Freq: Once | CUTANEOUS | Status: DC

## 2015-07-13 NOTE — Progress Notes (Signed)
PCP is Glendale Chard  Neurologist is [facility]. LOV was 03/24/15.  Patient stated she "passed out" in January of 2017 and she was diagnosed as having "seizures." Patient now takes Keppra and denied having any seizures since then.   Patient denied having any acute cardiac or pulmonary issues  Ebony Hail, [place] called and informed of patients history. Will request records from PCP Dr. Baird Cancer.   Nurse unsure if patient ever wore a holter monitor as patient had already left PAT before asking.

## 2015-07-14 ENCOUNTER — Encounter (HOSPITAL_COMMUNITY): Payer: Self-pay | Admitting: Surgery

## 2015-07-14 DIAGNOSIS — E210 Primary hyperparathyroidism: Secondary | ICD-10-CM | POA: Diagnosis present

## 2015-07-14 NOTE — Progress Notes (Signed)
Anesthesia Chart Review:  Pt is a 75 year old female scheduled for R inferior parathyroidectomy on 07/16/2015 with Armandina Gemma, MD  Neurologist is Arlice Colt, MD.   PMH includes:  HTN, hyperlipidemia, seizures (01/2015), syncope (12/2013 - thoroughly worked up with CT, MRI, EEG, echo), RA.  Current smoker. BMI 21.5  Medications include: ASA, lipitor, keppra  Preoperative labs reviewed.    EKG 02/05/15: Sinus rhythm. Probable left atrial enlargement. Left ventricular hypertrophy  Echo 01/13/14:  - Left ventricle: The cavity size was normal. Wall thickness was increased in a pattern of moderate LVH. Systolic function was vigorous. The estimated ejection fraction was in the range of 65% to 70%. Wall motion was normal; there were no regional wall motion abnormalities. Doppler parameters are consistent with abnormal left ventricular relaxation (grade 1 diastolic dysfunction). - Aortic valve: Valve area (VTI): 1.53 cm^2. Valve area (Vmax): 1.41 cm^2. Valve area (Vmean): 1.27 cm^2. - Mitral valve: There was mild regurgitation. Valve area by continuity equation (using LVOT flow): 1.65 cm^2.  If no changes, I anticipate pt can proceed with surgery as scheduled.   Willeen Cass, FNP-BC [facility]/Anesthesiology Phone: [phone] 07/14/2015 2:09 PM

## 2015-07-14 NOTE — H&P (Signed)
General Surgery [facility]  [name]. [name] DOB: 1940/10/08 Married / Language: English / Race: Black or African American Female  History of Present Illness  The patient is a 75 year old female who presents with a parathyroid neoplasm.  Patient is referred by Dr. Bryon Lions for evaluation and treatment of suspected primary hyperparathyroidism. Patient was noted on laboratory studies to have an elevated calcium level of 11.2. Further testing included an intact PTH level which was elevated at 107. Vitamin D level was slightly low at 26. Patient has a history of osteoporosis. She complains of chronic fatigue. She complains of bone and joint pain. She complains of frequent urination. Patient underwent nuclear medicine parathyroid scan on June 17, 2015. 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 Patient is now referred to discuss parathyroidectomy.   Other Problems  Arthritis Back Pain  Diagnostic Studies History Colonoscopy 5-10 years ago Pap Smear >5 years ago  Allergies No Known Drug Allergies06/20/2017  Medication History Carvedilol (6.25MG  Tablet, Oral) Active. Hydroxychloroquine Sulfate (200MG  Tablet, Oral) Active. LevETIRAcetam (500MG  Tablet, Oral) Active. Vitamin D (Ergocalciferol) (50000UNIT Capsule, Oral) Active. Medications Reconciled  Social History Alcohol use Occasional alcohol use. Caffeine use Carbonated beverages, Coffee, Tea. No drug use Tobacco use Current some day smoker.  Family History  Colon Cancer Father. Prostate Cancer Father.  Pregnancy / Birth History Age of menopause 59-50 Maternal age 34-25  Review of Systems General Present- Fatigue and Weight Loss. Not Present- Appetite Loss, Chills, Fever, Night Sweats and Weight Gain. Skin Present- Change in Wart/Mole and Dryness. Not Present- Hives, Jaundice, New Lesions, Non-Healing Wounds, Rash and Ulcer. HEENT Present-  Seasonal Allergies and Wears glasses/contact lenses. Not Present- Earache, Hearing Loss, Hoarseness, Nose Bleed, Oral Ulcers, Ringing in the Ears, Sinus Pain, Sore Throat, Visual Disturbances and Yellow Eyes. Respiratory Not Present- Bloody sputum, Chronic Cough, Difficulty Breathing, Snoring and Wheezing. Breast Not Present- Breast Mass, Breast Pain, Nipple Discharge and Skin Changes. Gastrointestinal Present- Gets full quickly at meals. Not Present- Abdominal Pain, Bloating, Bloody Stool, Change in Bowel Habits, Chronic diarrhea, Constipation, Difficulty Swallowing, Excessive gas, Hemorrhoids, Indigestion, Nausea, Rectal Pain and Vomiting. Female Genitourinary Present- Frequency. Not Present- Nocturia, Painful Urination, Pelvic Pain and Urgency. Musculoskeletal Present- Back Pain, Joint Pain, Muscle Pain and Muscle Weakness. Not Present- Joint Stiffness and Swelling of Extremities. Neurological Present- Decreased Memory, Fainting, Headaches and Weakness. Not Present- Numbness, Seizures, Tingling, Tremor and Trouble walking. Psychiatric Present- Depression. Not Present- Anxiety, Bipolar, Change in Sleep Pattern, Fearful and Frequent crying.  Vitals Weight: 112 lb Height: 60in Body Surface Area: 1.46 m Body Mass Index: 21.87 kg/m  Temp.: 97.52F(Temporal)  Pulse: 75 (Regular)  BP: 120/68 (Sitting, Left Arm, Standard)  Physical Exam  General - appears comfortable, no distress; not diaphorectic  HEENT - normocephalic; sclerae clear, gaze conjugate; mucous membranes moist, dentition good; voice normal  Neck - symmetric on extension; no palpable anterior or posterior cervical adenopathy; no palpable masses in the thyroid bed  Chest - clear bilaterally without rhonchi, rales, or wheeze  Cor - regular rhythm with normal rate; no significant murmur  Ext - non-tender without significant edema or lymphedema  Neuro - grossly intact; no tremor   Assessment & Plan   PRIMARY  HYPERPARATHYROIDISM (E21.0)  Patient presents on referral from her primary care physician for evaluation of suspected primary hyperparathyroidism. Patient has an elevated calcium level and an elevated intact PTH level. Nuclear medicine parathyroid scan localizes a parathyroid adenoma to the right  inferior position.  Patient is provided with written literature on parathyroid disease and parathyroid surgery to review at home.  I have recommended proceeding with minimally invasive outpatient right inferior parathyroidectomy. Patient denied discussed the risk and benefits of the procedure. We discussed the potential involvement of other parathyroid glands. We discussed her postoperative recovery and return to activity. She understands and wishes to proceed in the near future.  The risks and benefits of the procedure have been discussed at length with the patient. The patient understands the proposed procedure, potential alternative treatments, and the course of recovery to be expected. All of the patient's questions have been answered at this time. The patient wishes to proceed with surgery.  Becky Regal, MD, [facility] Office: [phone]

## 2015-07-15 MED ORDER — CEFAZOLIN SODIUM-DEXTROSE 2-4 GM/100ML-% IV SOLN
2.0000 g | INTRAVENOUS | Status: AC
  Administered 2015-07-16: 2 g via INTRAVENOUS
  Filled 2015-07-15: qty 100

## 2015-07-16 ENCOUNTER — Encounter (HOSPITAL_COMMUNITY): Payer: Self-pay | Admitting: Urology

## 2015-07-16 ENCOUNTER — Ambulatory Visit (HOSPITAL_COMMUNITY): Payer: Medicare Other | Admitting: Vascular Surgery

## 2015-07-16 ENCOUNTER — Ambulatory Visit (HOSPITAL_COMMUNITY): Payer: Medicare Other | Admitting: Certified Registered Nurse Anesthetist

## 2015-07-16 ENCOUNTER — Encounter (HOSPITAL_COMMUNITY)
Admission: RE | Disposition: A | Discharge status: Home / Self Care | Payer: Self-pay | Source: Ambulatory Visit | Attending: Surgery

## 2015-07-16 ENCOUNTER — Observation Stay (HOSPITAL_COMMUNITY)
Admission: RE | Admit: 2015-07-16 | Discharge: 2015-07-17 | Disposition: A | Discharge status: Home / Self Care | Payer: Medicare Other | Source: Ambulatory Visit | Attending: Surgery | Admitting: Surgery

## 2015-07-16 DIAGNOSIS — E210 Primary hyperparathyroidism: Secondary | ICD-10-CM | POA: Diagnosis not present

## 2015-07-16 DIAGNOSIS — F17200 Nicotine dependence, unspecified, uncomplicated: Secondary | ICD-10-CM | POA: Insufficient documentation

## 2015-07-16 HISTORY — DX: Migraine, unspecified, not intractable, without status migrainosus: G43.909

## 2015-07-16 HISTORY — PX: NECK EXPLORATION: SHX2077

## 2015-07-16 HISTORY — DX: Anxiety disorder, unspecified: F41.9

## 2015-07-16 HISTORY — DX: Acute embolism and thrombosis of unspecified deep veins of unspecified lower extremity: I82.409

## 2015-07-16 HISTORY — DX: Calculus of kidney: N20.0

## 2015-07-16 HISTORY — DX: Cardiac murmur, unspecified: R01.1

## 2015-07-16 HISTORY — PX: PARATHYROIDECTOMY: SHX19

## 2015-07-16 SURGERY — PARATHYROIDECTOMY
Anesthesia: General | Site: Neck | Laterality: Right

## 2015-07-16 MED ORDER — ACETAMINOPHEN 650 MG RE SUPP: 650.0000 mg | Freq: Four times a day (QID) | RECTAL | Status: DC | PRN

## 2015-07-16 MED ORDER — HYDRALAZINE HCL 20 MG/ML IJ SOLN
2.5000 mg | INTRAMUSCULAR | Status: DC | PRN
  Administered 2015-07-16: 2.5 mg via INTRAVENOUS

## 2015-07-16 MED ORDER — PHENYLEPHRINE 40 MCG/ML (10ML) SYRINGE FOR IV PUSH (FOR BLOOD PRESSURE SUPPORT)
PREFILLED_SYRINGE | INTRAVENOUS | Status: AC
Start: 2015-07-16 — End: 2015-07-16
  Filled 2015-07-16: qty 10

## 2015-07-16 MED ORDER — LIDOCAINE 2% (20 MG/ML) 5 ML SYRINGE
INTRAMUSCULAR | Status: AC
  Filled 2015-07-16: qty 15

## 2015-07-16 MED ORDER — LABETALOL HCL 5 MG/ML IV SOLN
5.0000 mg | INTRAVENOUS | Status: AC | PRN
  Administered 2015-07-16 (×2): 5 mg via INTRAVENOUS

## 2015-07-16 MED ORDER — SODIUM CHLORIDE 0.9 % IJ SOLN
INTRAMUSCULAR | Status: AC
  Filled 2015-07-16: qty 10

## 2015-07-16 MED ORDER — ROCURONIUM BROMIDE 100 MG/10ML IV SOLN
INTRAVENOUS | Status: DC | PRN
  Administered 2015-07-16: 30 mg via INTRAVENOUS
  Administered 2015-07-16: 10 mg via INTRAVENOUS

## 2015-07-16 MED ORDER — ACETAMINOPHEN 325 MG PO TABS: 650.0000 mg | ORAL_TABLET | Freq: Four times a day (QID) | ORAL | Status: DC | PRN

## 2015-07-16 MED ORDER — ONDANSETRON 4 MG PO TBDP
4.0000 mg | ORAL_TABLET | Freq: Four times a day (QID) | ORAL | Status: DC | PRN
Start: 2015-07-16 — End: 2015-07-17

## 2015-07-16 MED ORDER — BUPIVACAINE HCL (PF) 0.25 % IJ SOLN
INTRAMUSCULAR | Status: AC
  Filled 2015-07-16: qty 30

## 2015-07-16 MED ORDER — HEMOSTATIC AGENTS (NO CHARGE) OPTIME
TOPICAL | Status: DC | PRN
  Administered 2015-07-16: 1 via TOPICAL

## 2015-07-16 MED ORDER — HYDROCODONE-ACETAMINOPHEN 5-325 MG PO TABS: 1.0000 | ORAL_TABLET | ORAL | Status: DC | PRN

## 2015-07-16 MED ORDER — FENTANYL CITRATE (PF) 250 MCG/5ML IJ SOLN
INTRAMUSCULAR | Status: AC
  Filled 2015-07-16: qty 5

## 2015-07-16 MED ORDER — FENTANYL CITRATE (PF) 100 MCG/2ML IJ SOLN
INTRAMUSCULAR | Status: DC | PRN
  Administered 2015-07-16 (×3): 50 ug via INTRAVENOUS
  Administered 2015-07-16: 25 ug via INTRAVENOUS
  Administered 2015-07-16: 50 ug via INTRAVENOUS
  Administered 2015-07-16: 25 ug via INTRAVENOUS

## 2015-07-16 MED ORDER — KCL IN DEXTROSE-NACL 20-5-0.45 MEQ/L-%-% IV SOLN
INTRAVENOUS | Status: DC
  Administered 2015-07-16: 15:00:00 via INTRAVENOUS
  Filled 2015-07-16: qty 1000

## 2015-07-16 MED ORDER — LACTATED RINGERS IV SOLN
INTRAVENOUS | Status: DC
  Administered 2015-07-16 (×2): via INTRAVENOUS

## 2015-07-16 MED ORDER — MEPERIDINE HCL 25 MG/ML IJ SOLN: 6.2500 mg | INTRAMUSCULAR | Status: DC | PRN

## 2015-07-16 MED ORDER — HYDRALAZINE HCL 20 MG/ML IJ SOLN
INTRAMUSCULAR | Status: AC
  Filled 2015-07-16: qty 1

## 2015-07-16 MED ORDER — LIDOCAINE 2% (20 MG/ML) 5 ML SYRINGE
INTRAMUSCULAR | Status: DC | PRN
  Administered 2015-07-16: 100 mg via INTRAVENOUS

## 2015-07-16 MED ORDER — LEVETIRACETAM 500 MG PO TABS
1000.0000 mg | ORAL_TABLET | Freq: Every day | ORAL | Status: DC
  Administered 2015-07-16: 1000 mg via ORAL
  Filled 2015-07-16: qty 2

## 2015-07-16 MED ORDER — PROPOFOL 10 MG/ML IV BOLUS
INTRAVENOUS | Status: DC | PRN
  Administered 2015-07-16: 140 mg via INTRAVENOUS

## 2015-07-16 MED ORDER — LABETALOL HCL 5 MG/ML IV SOLN
INTRAVENOUS | Status: DC | PRN
  Administered 2015-07-16: 5 mg via INTRAVENOUS
  Administered 2015-07-16: 10 mg via INTRAVENOUS

## 2015-07-16 MED ORDER — MIDAZOLAM HCL 2 MG/2ML IJ SOLN
INTRAMUSCULAR | Status: AC
  Filled 2015-07-16: qty 2

## 2015-07-16 MED ORDER — NALOXONE HCL 0.4 MG/ML IJ SOLN
INTRAMUSCULAR | Status: AC
  Filled 2015-07-16: qty 1

## 2015-07-16 MED ORDER — ONDANSETRON HCL 4 MG/2ML IJ SOLN: 4.0000 mg | Freq: Four times a day (QID) | INTRAMUSCULAR | Status: DC | PRN

## 2015-07-16 MED ORDER — HYDROMORPHONE HCL 1 MG/ML IJ SOLN
1.0000 mg | INTRAMUSCULAR | Status: DC | PRN
  Administered 2015-07-17: 1 mg via INTRAVENOUS
  Filled 2015-07-16: qty 1

## 2015-07-16 MED ORDER — KCL IN DEXTROSE-NACL 20-5-0.45 MEQ/L-%-% IV SOLN
INTRAVENOUS | Status: AC
  Filled 2015-07-16: qty 1000

## 2015-07-16 MED ORDER — 0.9 % SODIUM CHLORIDE (POUR BTL) OPTIME
TOPICAL | Status: DC | PRN
  Administered 2015-07-16: 1000 mL

## 2015-07-16 MED ORDER — ESMOLOL HCL 100 MG/10ML IV SOLN
INTRAVENOUS | Status: DC | PRN
  Administered 2015-07-16 (×2): 30 mg via INTRAVENOUS

## 2015-07-16 MED ORDER — BOOST / RESOURCE BREEZE PO LIQD
1.0000 | Freq: Three times a day (TID) | ORAL | Status: DC
  Administered 2015-07-16: 1 via ORAL

## 2015-07-16 MED ORDER — BUPIVACAINE HCL (PF) 0.25 % IJ SOLN
INTRAMUSCULAR | Status: DC | PRN
  Administered 2015-07-16: 10 mL

## 2015-07-16 MED ORDER — ONDANSETRON HCL 4 MG/2ML IJ SOLN
INTRAMUSCULAR | Status: DC | PRN
  Administered 2015-07-16: 4 mg via INTRAVENOUS

## 2015-07-16 MED ORDER — ROCURONIUM BROMIDE 50 MG/5ML IV SOLN
INTRAVENOUS | Status: AC
  Filled 2015-07-16: qty 3

## 2015-07-16 MED ORDER — ONDANSETRON HCL 4 MG/2ML IJ SOLN
INTRAMUSCULAR | Status: AC
  Filled 2015-07-16: qty 6

## 2015-07-16 MED ORDER — PHENYLEPHRINE 40 MCG/ML (10ML) SYRINGE FOR IV PUSH (FOR BLOOD PRESSURE SUPPORT)
PREFILLED_SYRINGE | INTRAVENOUS | Status: DC | PRN
  Administered 2015-07-16 (×2): 80 ug via INTRAVENOUS

## 2015-07-16 MED ORDER — PROPOFOL 10 MG/ML IV BOLUS
INTRAVENOUS | Status: AC
  Filled 2015-07-16: qty 20

## 2015-07-16 MED ORDER — ONDANSETRON HCL 4 MG/2ML IJ SOLN: 4.0000 mg | Freq: Once | INTRAMUSCULAR | Status: DC | PRN

## 2015-07-16 MED ORDER — DEXTROSE 5 % IV SOLN
10.0000 mg | INTRAVENOUS | Status: DC | PRN
  Administered 2015-07-16: 15 ug/min via INTRAVENOUS

## 2015-07-16 MED ORDER — LABETALOL HCL 5 MG/ML IV SOLN
5.0000 mg | INTRAVENOUS | Status: DC | PRN
  Administered 2015-07-16 (×2): 5 mg via INTRAVENOUS

## 2015-07-16 MED ORDER — VECURONIUM BROMIDE 10 MG IV SOLR
INTRAVENOUS | Status: AC
  Filled 2015-07-16: qty 10

## 2015-07-16 MED ORDER — EPHEDRINE 5 MG/ML INJ
INTRAVENOUS | Status: AC
  Filled 2015-07-16: qty 20

## 2015-07-16 MED ORDER — SUGAMMADEX SODIUM 200 MG/2ML IV SOLN
INTRAVENOUS | Status: DC | PRN
  Administered 2015-07-16: 100.4 mg via INTRAVENOUS

## 2015-07-16 MED ORDER — FENTANYL CITRATE (PF) 100 MCG/2ML IJ SOLN: 25.0000 ug | INTRAMUSCULAR | Status: DC | PRN

## 2015-07-16 MED ORDER — LABETALOL HCL 5 MG/ML IV SOLN
INTRAVENOUS | Status: AC
  Filled 2015-07-16: qty 4

## 2015-07-16 SURGICAL SUPPLY — 54 items
APL SKNCLS STERI-STRIP NONHPOA (GAUZE/BANDAGES/DRESSINGS) ×1
ATTRACTOMAT 16X20 MAGNETIC DRP (DRAPES) ×3 IMPLANT
BENZOIN TINCTURE PRP APPL 2/3 (GAUZE/BANDAGES/DRESSINGS) ×3 IMPLANT
BLADE SURG 15 STRL LF DISP TIS (BLADE) ×1 IMPLANT
BLADE SURG 15 STRL SS (BLADE) ×3
CANISTER SUCTION 2500CC (MISCELLANEOUS) ×3 IMPLANT
CHLORAPREP W/TINT 26ML (MISCELLANEOUS) ×3 IMPLANT
CLIP TI MEDIUM 6 (CLIP) ×3 IMPLANT
CLIP TI WIDE RED SMALL 6 (CLIP) ×3 IMPLANT
CLOSURE STERI-STRIP 1/2X4 (GAUZE/BANDAGES/DRESSINGS) ×1
CLOSURE WOUND 1/2 X4 (GAUZE/BANDAGES/DRESSINGS) ×1
CLSR STERI-STRIP ANTIMIC 1/2X4 (GAUZE/BANDAGES/DRESSINGS) ×2 IMPLANT
CONT SPEC 4OZ CLIKSEAL STRL BL (MISCELLANEOUS) ×6 IMPLANT
COVER SURGICAL LIGHT HANDLE (MISCELLANEOUS) ×3 IMPLANT
CRADLE DONUT ADULT HEAD (MISCELLANEOUS) ×3 IMPLANT
DRAPE LAPAROTOMY T 98X78 PEDS (DRAPES) ×3 IMPLANT
DRAPE UTILITY XL STRL (DRAPES) ×3 IMPLANT
ELECT CAUTERY BLADE 6.4 (BLADE) ×3 IMPLANT
ELECT REM PT RETURN 9FT ADLT (ELECTROSURGICAL) ×3
ELECTRODE REM PT RTRN 9FT ADLT (ELECTROSURGICAL) ×1 IMPLANT
GAUZE SPONGE 2X2 8PLY STRL LF (GAUZE/BANDAGES/DRESSINGS) ×1 IMPLANT
GAUZE SPONGE 4X4 16PLY XRAY LF (GAUZE/BANDAGES/DRESSINGS) ×3 IMPLANT
GLOVE SURG ORTHO 8.0 STRL STRW (GLOVE) ×3 IMPLANT
GLOVE SURG SS PI 7.5 STRL IVOR (GLOVE) ×3 IMPLANT
GOWN STRL REUS W/ TWL LRG LVL3 (GOWN DISPOSABLE) ×1 IMPLANT
GOWN STRL REUS W/ TWL XL LVL3 (GOWN DISPOSABLE) ×1 IMPLANT
GOWN STRL REUS W/TWL LRG LVL3 (GOWN DISPOSABLE) ×3
GOWN STRL REUS W/TWL XL LVL3 (GOWN DISPOSABLE) ×3
HEMOSTAT SURGICEL 2X4 FIBR (HEMOSTASIS) ×3 IMPLANT
KIT BASIN OR (CUSTOM PROCEDURE TRAY) ×3 IMPLANT
KIT ROOM TURNOVER OR (KITS) ×3 IMPLANT
NEEDLE HYPO 25GX1X1/2 BEV (NEEDLE) ×3 IMPLANT
NS IRRIG 1000ML POUR BTL (IV SOLUTION) ×3 IMPLANT
PACK SURGICAL SETUP 50X90 (CUSTOM PROCEDURE TRAY) ×3 IMPLANT
PAD ARMBOARD 7.5X6 YLW CONV (MISCELLANEOUS) ×3 IMPLANT
PENCIL BUTTON HOLSTER BLD 10FT (ELECTRODE) ×3 IMPLANT
SHEARS HARMONIC 9CM CVD (BLADE) ×3 IMPLANT
SPONGE GAUZE 2X2 STER 10/PKG (GAUZE/BANDAGES/DRESSINGS) ×2
SPONGE GAUZE 4X4 12PLY STER LF (GAUZE/BANDAGES/DRESSINGS) ×3 IMPLANT
SPONGE INTESTINAL PEANUT (DISPOSABLE) ×3 IMPLANT
STRIP CLOSURE SKIN 1/2X4 (GAUZE/BANDAGES/DRESSINGS) ×2 IMPLANT
SUT MNCRL AB 4-0 PS2 18 (SUTURE) ×3 IMPLANT
SUT SILK 2 0 (SUTURE)
SUT SILK 2-0 18XBRD TIE 12 (SUTURE) IMPLANT
SUT SILK 3 0 (SUTURE) ×3
SUT SILK 3-0 18XBRD TIE 12 (SUTURE) ×1 IMPLANT
SUT VIC AB 3-0 SH 18 (SUTURE) ×6 IMPLANT
SYR BULB 3OZ (MISCELLANEOUS) ×3 IMPLANT
SYR CONTROL 10ML LL (SYRINGE) ×3 IMPLANT
TAPE CLOTH SURG 6X10 WHT LF (GAUZE/BANDAGES/DRESSINGS) ×3 IMPLANT
TOWEL OR 17X24 6PK STRL BLUE (TOWEL DISPOSABLE) IMPLANT
TOWEL OR 17X26 10 PK STRL BLUE (TOWEL DISPOSABLE) ×3 IMPLANT
TUBE CONNECTING 12'X1/4 (SUCTIONS) ×1
TUBE CONNECTING 12X1/4 (SUCTIONS) ×2 IMPLANT

## 2015-07-16 NOTE — Progress Notes (Signed)
215/102, NSR 65 after an additional 10 mg IV Labetelol. Dr Royce Macadamia updated. New order for additional 10mg  Labetelol.

## 2015-07-16 NOTE — Progress Notes (Signed)
Dr Royce Macadamia here to see pt--fully updated. New order for IV Hydralazine for elevated BP. Will cont to monitor closely.

## 2015-07-16 NOTE — Brief Op Note (Signed)
07/16/2015  2:27 PM  PATIENT:  [name]  75 y.o. female  PRE-OPERATIVE DIAGNOSIS:  primary hyperparathyroidism  POST-OPERATIVE DIAGNOSIS:  primary hyperparathyroidism  PROCEDURE:  1. Neck exploration and right superior parathyroidectomy  2. Biopsy of right inferior parathyroid  SURGEON:  Surgeon(s) and Role:    * Armandina Gemma, MD - Primary  ANESTHESIA:   general  EBL:  Total I/O In: -  Out: 20 [Blood:20]  BLOOD ADMINISTERED:none  DRAINS: none   LOCAL MEDICATIONS USED:  MARCAINE     SPECIMEN:  Excision  DISPOSITION OF SPECIMEN:  PATHOLOGY  COUNTS:  YES  TOURNIQUET:  * No tourniquets in log *  DICTATION: dictated  PLAN OF CARE: Admit for overnight observation  PATIENT DISPOSITION:  PACU - hemodynamically stable.   Delay start of Pharmacological VTE agent (>24hrs) due to surgical blood loss or risk of bleeding: yes  Earnstine Regal, MD, [facility] Office: [phone]

## 2015-07-16 NOTE — Anesthesia Procedure Notes (Signed)
Procedure Name: Intubation Date/Time: 07/16/2015 12:44 PM Performed by: Bethel Born Pre-anesthesia Checklist: Patient identified, Emergency Drugs available, Suction available, Patient being monitored and Timeout performed Patient Re-evaluated:Patient Re-evaluated prior to inductionOxygen Delivery Method: Circle system utilized Preoxygenation: Pre-oxygenation with 100% oxygen Intubation Type: IV induction Ventilation: Oral airway inserted - appropriate to patient size Laryngoscope Size: Miller Grade View: Grade I Tube type: Oral Tube size: 7.5 mm Number of attempts: 1 Airway Equipment and Method: Stylet Placement Confirmation: ETT inserted through vocal cords under direct vision,  positive ETCO2 and breath sounds checked- equal and bilateral Secured at: 22 cm Tube secured with: Tape Dental Injury: Teeth and Oropharynx as per pre-operative assessment

## 2015-07-16 NOTE — Progress Notes (Signed)
Report to Nicholaus Corolla RN as primary caregiver

## 2015-07-16 NOTE — Interval H&P Note (Signed)
History and Physical Interval Note:  07/16/2015 11:58 AM  [name]  has presented today for surgery, with the diagnosis of primary hyperparathyroidism.  The various methods of treatment have been discussed with the patient and family. After consideration of risks, benefits and other options for treatment, the patient has consented to    Procedure(s): RIGHT INFERIOR PARATHYROIDECTOMY (Right) as a surgical intervention .    The patient's history has been reviewed, patient examined, no change in status, stable for surgery.  I have reviewed the patient's chart and labs.  Questions were answered to the patient's satisfaction.    Earnstine Regal, MD, [facility] Office: [place]

## 2015-07-16 NOTE — Anesthesia Preprocedure Evaluation (Signed)
Anesthesia Evaluation  Patient identified by MRN, date of birth, ID band Patient awake    Reviewed: Allergy & Precautions, H&P , NPO status , Patient's Chart, lab work & pertinent test results  Airway Mallampati: II  TM Distance: >3 FB Neck ROM: full    Dental  (+) Edentulous Upper, Edentulous Lower   Pulmonary Current Smoker,    Pulmonary exam normal breath sounds clear to auscultation       Cardiovascular Exercise Tolerance: Good Normal cardiovascular exam     Neuro/Psych    GI/Hepatic negative GI ROS, Neg liver ROS,   Endo/Other  negative endocrine ROS  Renal/GU   negative genitourinary   Musculoskeletal   Abdominal Normal abdominal exam  (+)   Peds  Hematology negative hematology ROS (+)   Anesthesia Other Findings   Reproductive/Obstetrics negative OB ROS                             Anesthesia Physical Anesthesia Plan  ASA: II  Anesthesia Plan: General   Post-op Pain Management:    Induction: Intravenous  Airway Management Planned: Oral ETT  Additional Equipment:   Intra-op Plan:   Post-operative Plan: Extubation in OR  Informed Consent: I have reviewed the patients History and Physical, chart, labs and discussed the procedure including the risks, benefits and alternatives for the proposed anesthesia with the patient or authorized representative who has indicated his/her understanding and acceptance.     Plan Discussed with: CRNA and Surgeon  Anesthesia Plan Comments:         Anesthesia Quick Evaluation

## 2015-07-16 NOTE — Progress Notes (Signed)
Pt admitted to 6N26 via stretcher from PACU.  Pt AAO X 4.  Pt on RA.  Pt has 20G to Rt wrist with fluids infusing.  Pt has infusion to neck with gauze dressing.  SCDs in place.  Pt has no questions at the moment.  Will continue to monitor.  Report rcvd from [place], [place].

## 2015-07-16 NOTE — Transfer of Care (Signed)
Immediate Anesthesia Transfer of Care Note  Patient: ANAY RATTS  Procedure(s) Performed: Procedure(s): RIGHT INFERIOR PARATHYROIDECTOMY (Right)  Patient Location: PACU  Anesthesia Type:General  Level of Consciousness: oriented, patient cooperative and responds to stimulation, drowsy  Airway & Oxygen Therapy: Patient Spontanous Breathing and Patient connected to nasal cannula oxygen  Post-op Assessment: Report given to RN and Post -op Vital signs reviewed and stable  Post vital signs: Reviewed and stable  Last Vitals:  Filed Vitals:   07/16/15 1010  BP: 152/99  Pulse: 73  Temp: 37.1 C  Resp: 20    Last Pain:  Filed Vitals:   07/16/15 1444  PainSc: 5          Complications: No apparent anesthesia complications

## 2015-07-16 NOTE — Progress Notes (Signed)
Dr Royce Macadamia at bedside. Fully updated. New order for up to 10mg  more IV Labetelol in PACU for elev. SBP as long as HR  > 60. Will cont to monitor.

## 2015-07-17 ENCOUNTER — Encounter (HOSPITAL_COMMUNITY): Payer: Self-pay | Admitting: Surgery

## 2015-07-17 DIAGNOSIS — E210 Primary hyperparathyroidism: Secondary | ICD-10-CM | POA: Diagnosis not present

## 2015-07-17 LAB — BASIC METABOLIC PANEL
ANION GAP: 9 (ref 5–15)
BUN: 13 mg/dL (ref 6–20)
CALCIUM: 9 mg/dL (ref 8.9–10.3)
CO2: 21 mmol/L — ABNORMAL LOW (ref 22–32)
Chloride: 107 mmol/L (ref 101–111)
Creatinine, Ser: 1.26 mg/dL — ABNORMAL HIGH (ref 0.44–1.00)
GFR, EST AFRICAN AMERICAN: 47 mL/min — AB (ref >60)
GFR, EST NON AFRICAN AMERICAN: 40 mL/min — AB (ref >60)
Glucose, Bld: 108 mg/dL — ABNORMAL HIGH (ref 65–99)
Potassium: 4.5 mmol/L (ref 3.5–5.1)
SODIUM: 137 mmol/L (ref 135–145)

## 2015-07-17 MED ORDER — HYDROCODONE-ACETAMINOPHEN 5-325 MG PO TABS: 1.0000 | ORAL_TABLET | ORAL | Status: DC | PRN

## 2015-07-17 NOTE — Op Note (Signed)
[name], Adams               ACCOUNT NO.:  [id number]  MEDICAL RECORD NO.:  [id number]  LOCATION:  6N26C                        FACILITY:  [facility]  PHYSICIAN:  Earnstine Regal, MD      DATE OF BIRTH:  05-04-1940  DATE OF PROCEDURE:  07/16/2015                              OPERATIVE REPORT   PREOPERATIVE DIAGNOSIS:  Primary hyperparathyroidism.  POSTOPERATIVE DIAGNOSIS:  Primary hyperparathyroidism.  PROCEDURE: 1. Neck exploration with right superior parathyroidectomy for adenoma. 2. Biopsy of right inferior parathyroid.  SURGEON:  Earnstine Regal, MD, FACS  ANESTHESIA:  General.  ESTIMATED BLOOD LOSS:  Minimal.  PREPARATION:  ChloraPrep.  COMPLICATIONS:  None.  INDICATIONS:  The patient is a 75 year old female referred by Dr. Glendale Adams for evaluation and treatment of suspected primary hyperparathyroidism.  Laboratory studies show an elevated calcium level of 11.2 with an elevated intact PTH level of 107.  Vitamin D level was slightly low at 26.  The patient has a history of osteoporosis.  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  The patient now comes to Surgery for parathyroidectomy.  BODY OF REPORT:  Procedure was done in OR #2 at the [facility].  The patient was brought to the operating room, placed in supine position on the operating room table.  Following administration of general anesthesia, the patient was positioned and then prepped and draped in the usual aseptic fashion.  After ascertaining that an adequate level of anesthesia had been achieved, a right neck incision was made just above the right clavicle for approximately 3 cm with a #15 blade.  Dissection was carried through the subcutaneous tissues and platysma.  External jugular vein was ligated in continuity and divided. Strap muscles were incised in the midline, and the thyroid was exposed. Right thyroid lobe was moderately  enlarged.  It was somewhat nodular on its inferior pole.  With some difficulty, it was mobilized, but parathyroid tissue was not easily identified.  Right peritracheal lymph nodes were identified.  Decision was made to extend the incision and enlarge the operative field in order to perform adequate exploration.  Skin incision was extended across the midline.  Skin flaps were then elevated cephalad and caudad using the electrocautery for hemostasis. The [place] self-retaining retractor was placed for exposure.  Strap muscles were incised in the midline and reflected laterally exposing the entire right thyroid lobe.  Lobe appears to have a cystic mass in the inferior pole.  Her remainder of the thyroid lobe appears grossly normal.  Lobe was rolled anteriorly.  Exploration reveals an enlarged parathyroid gland just above the level of the inferior thyroid artery consistent with a small parathyroid adenoma.  This gland measured approximately 1 cm in diameter.  It was gently dissected off the thyroid gland taking care to preserve the underlying recurrent laryngeal nerve. Vascular structures were divided between Ligaclips.  The entire gland was excised and submitted to Pathology.  Frozen section shows parathyroid tissue with oncocytic features.  It is slightly enlarged.  Further exploration in the right neck reveals what appears to be a normal inferior parathyroid gland overlying the inferior pole of the thyroid.  This gland was gently mobilized and a biopsy was taken by transecting the gland between medium Ligaclips.  Biopsy was submitted to Pathology and normal parathyroid tissue is confirmed.  Right neck was irrigated with warm saline.  Good hemostasis was noted. Fibrillar was placed throughout the operative field.  Strap muscles were reapproximated in the midline with interrupted 3-0 Vicryl sutures. Platysma was closed with interrupted 3-0 Vicryl sutures.  Skin was anesthetized with  local anesthetic.  Skin edges were reapproximated with a running 4-0 Monocryl subcuticular suture.  Wound was washed and dried and benzoin and Steri-Strips were applied.  Sterile dressings were applied.  The patient was awakened from anesthesia and brought to the recovery room.  The patient tolerated the procedure well.   Earnstine Regal, MD, [facility] Office: [phone]   TMG/MEDQ  D:  07/16/2015  T:  07/17/2015  Job:  [id number]  cc:   Theda Belfast. Baird Cancer, M.D.

## 2015-07-17 NOTE — Progress Notes (Signed)
Discharge paperwork given to patient. No questions verbalized. Prescription given. Patient is ready to discharge.

## 2015-07-17 NOTE — Discharge Summary (Signed)
Physician Discharge Summary [facility]  Patient ID: [name] MRN: [id number] DOB/AGE: 1940-04-25 75 y.o.  Admit date: 07/16/2015 Discharge date: 07/17/2015  Admission Diagnoses:  Primary hyperparathyroidism  Discharge Diagnoses:  Principal Problem:   Hyperparathyroidism, primary ([place]) Active Problems:   Primary hyperparathyroidism [facility])   Discharged Condition: good  Hospital Course: Patient was admitted for observation following parathyroid surgery.  Post op course was uncomplicated.  Pain was well controlled.  Tolerated diet.  Post op calcium level on morning following surgery was 9.0 mg/dl.  Patient was prepared for discharge home on POD#1.  Consults: None  Treatments: surgery: neck exploration and parathryroidectomy  Discharge Exam: Blood pressure 172/69, pulse 95, temperature 99.8 F (37.7 C), temperature source Oral, resp. rate 18, height 5' (1.524 m), weight 50.168 kg (110 lb 9.6 oz), SpO2 98 %. HEENT - clear Neck - wound dry and intact, mild STS; voice normal Chest - clear bilaterally Cor - RRR  Disposition: Home  Discharge Instructions    Apply dressing    Complete by:  As directed   Apply light gauze dressing to wound before discharge home today.     Diet - low sodium heart healthy    Complete by:  As directed      Discharge instructions    Complete by:  As directed   [facility]  THYROID & PARATHYROID SURGERY:  POST-OP INSTRUCTIONS  Always review your discharge instruction sheet from the facility where your surgery was performed.  A prescription for pain medication may be given to you upon discharge.  Take your pain medication as prescribed.  If narcotic pain medicine is not needed, then you may take acetaminophen (Tylenol) or ibuprofen (Advil) as needed.  Take your usually prescribed medications unless otherwise directed.  If you need a refill on your pain medication, please contact your pharmacy. They will  contact our office to request authorization.  Prescriptions will not be processed by our office after 5 pm or on weekends.  Start with a light diet upon arrival home, such as soup and crackers or toast.  Be sure to drink plenty of fluids daily.  Resume your normal diet the day after surgery.  Most patients will experience some swelling and bruising on the chest and neck area.  Ice packs will help.  Swelling and bruising can take several days to resolve.   It is common to experience some constipation after surgery.  Increasing fluid intake and taking a stool softener will usually help or prevent this problem.  A mild laxative (Milk of Magnesia or Miralax) should be taken according to package directions if there has been no bowel movement after 48 hours.  You have steri-strips and a gauze dressing over your incision.  You may remove the gauze bandage on the second day after surgery, and you may shower at that time.  Leave your steri-strips (small skin tapes) in place directly over the incision.  These strips should remain on the skin for 5-7 days and then be removed.  You may get them wet in the shower and pat them dry.  You may resume regular (light) daily activities beginning the next day - such as daily self-care, walking, climbing stairs - gradually increasing activities as tolerated.  You may have sexual intercourse when it is comfortable.  Refrain from any heavy lifting or straining until approved by your doctor.  You may drive when you no longer are taking prescription pain medication, you can comfortably wear a  seatbelt, and you can safely maneuver your car and apply brakes.  You should see your doctor in the office for a follow-up appointment approximately two to three weeks after your surgery.  Make sure that you call for this appointment within a day or two after you arrive home to insure a convenient appointment time.  WHEN TO CALL YOUR DOCTOR: -- Fever greater than 101.5 -- Inability to  urinate -- Nausea and/or vomiting - persistent -- Extreme swelling or bruising -- Continued bleeding from incision -- Increased pain, redness, or drainage from the incision -- Difficulty swallowing or breathing -- Muscle cramping or spasms -- Numbness or tingling in hands or around lips  The clinic staff is available to answer your questions during regular business hours.  Please don't hesitate to call and ask to speak to one of the nurses if you have concerns.  Earnstine Regal, MD, [facility] Office: [phone]  Website: [email or web address]     Ice pack    Complete by:  As directed      Increase activity slowly    Complete by:  As directed      Remove dressing in 24 hours    Complete by:  As directed             Medication List    TAKE these medications        acetaminophen 325 MG tablet  Commonly known as:  TYLENOL  Take 2 tablets (650 mg total) by mouth every 6 (six) hours as needed for mild pain (or Fever >/= 101).     aspirin 81 MG tablet  Take 81 mg by mouth daily.     atorvastatin 20 MG tablet  Commonly known as:  LIPITOR  Take 20 mg by mouth daily. Reported on 03/24/2015     HYDROcodone-acetaminophen 5-325 MG tablet  Commonly known as:  NORCO/VICODIN  Take 1-2 tablets by mouth every 4 (four) hours as needed for moderate pain.     levETIRAcetam 500 MG tablet  Commonly known as:  KEPPRA  Take 2 tablets (1,000 mg total) by mouth daily.           Follow-up Information    Follow up with Earnstine Regal, MD. Schedule an appointment as soon as possible for a visit in 3 weeks.   Specialty:  General Surgery   Why:  For wound re-check   Contact information:   [place] [address] [phone]       Earnstine Regal, MD, [facility] Office: [phone]   Signed: Earnstine Regal 07/17/2015, 8:07 AM

## 2015-07-19 NOTE — Anesthesia Postprocedure Evaluation (Signed)
Anesthesia Post Note  Patient: [name]  Procedure(s) Performed: Procedure(s) (LRB): RIGHT INFERIOR PARATHYROIDECTOMY (Right)  Patient location during evaluation: PACU Anesthesia Type: General Level of consciousness: sedated Pain management: pain level controlled Vital Signs Assessment: post-procedure vital signs reviewed and stable Respiratory status: spontaneous breathing Cardiovascular status: stable Postop Assessment: no signs of nausea or vomiting Anesthetic complications: no     Last Vitals:  Filed Vitals:   07/17/15 0619 07/17/15 0944  BP: 172/69 139/61  Pulse: 95 88  Temp: 37.7 C 36.3 C  Resp: 18     Last Pain:  Filed Vitals:   07/17/15 0945  PainSc: 7    Pain Goal: Patients Stated Pain Goal: 2 (07/17/15 0615)               Ramari Bray JR,JOHN [facility]

## 2015-08-03 ENCOUNTER — Encounter: Payer: Self-pay | Admitting: Neurology

## 2015-08-03 ENCOUNTER — Ambulatory Visit (INDEPENDENT_AMBULATORY_CARE_PROVIDER_SITE_OTHER): Payer: Medicare Other | Admitting: Neurology

## 2015-08-03 VITALS — BP 128/78 | HR 68 | Resp 16 | Ht 60.0 in | Wt 115.0 lb

## 2015-08-03 DIAGNOSIS — N183 Chronic kidney disease, stage 3 unspecified: Secondary | ICD-10-CM | POA: Insufficient documentation

## 2015-08-03 DIAGNOSIS — Z0000 Encounter for general adult medical examination without abnormal findings: Secondary | ICD-10-CM | POA: Insufficient documentation

## 2015-08-03 DIAGNOSIS — M542 Cervicalgia: Secondary | ICD-10-CM

## 2015-08-03 DIAGNOSIS — R269 Unspecified abnormalities of gait and mobility: Secondary | ICD-10-CM | POA: Diagnosis not present

## 2015-08-03 DIAGNOSIS — M25511 Pain in right shoulder: Secondary | ICD-10-CM

## 2015-08-03 DIAGNOSIS — R55 Syncope and collapse: Secondary | ICD-10-CM

## 2015-08-03 DIAGNOSIS — Z79899 Other long term (current) drug therapy: Secondary | ICD-10-CM | POA: Insufficient documentation

## 2015-08-03 DIAGNOSIS — I129 Hypertensive chronic kidney disease with stage 1 through stage 4 chronic kidney disease, or unspecified chronic kidney disease: Secondary | ICD-10-CM | POA: Insufficient documentation

## 2015-08-03 DIAGNOSIS — N950 Postmenopausal bleeding: Secondary | ICD-10-CM | POA: Insufficient documentation

## 2015-08-03 DIAGNOSIS — E559 Vitamin D deficiency, unspecified: Secondary | ICD-10-CM | POA: Insufficient documentation

## 2015-08-03 MED ORDER — LEVETIRACETAM 500 MG PO TABS: 1000.0000 mg | ORAL_TABLET | Freq: Every day | ORAL | Status: DC

## 2015-08-03 NOTE — Progress Notes (Signed)
p-  [facility]  PATIENT: [name] DOB: 01-17-1940  REFERRING CLINICIAN: Glendale Chard HISTORY FROM: Patient and grandduaghter REASON FOR VISIT: Syncope and confusion   HISTORICAL  CHIEF COMPLAINT:  Chief Complaint  Patient presents with  . Altered Level of Consciousness    Denies further episodes of altered awareness.  Sts. is compliant with Keppra.  Sts. she feels better than she has in a long time.  Recent thyroid surgery.[place]    HISTORY OF PRESENT ILLNESS:  [name] is a 75 year old woman with episodes of syncope.    She is feeling much better with more energy.   She has had no more episodes of syncope/LOC.     Her right shoulder is hurting a lot more.  Spells:    In late January, she was on a barstool and was leaning back in the chair.   She passed out and was helped to the floor.  She had another episode identical to this one while sitting in the same chair last year.     She went to the ER.   She had LOC x 30 minutes and was somnolent another couple hours,    She was back to herself the next day.     She did not have any GTC activity with either event.    She was admitted to [facility] ED and MRI showed atrophy and moderate small vessel ischemic disease but no acute findings.   EEG showed generalized slowing but no epileptiform activity.       She was placed on Keppra .   She has not had other episodes of syncope/seizure since the event 6 weeks ago.     Arm pain and weakness:    Last year, she had left arm pain and weakness.  It is better but right shoulder is hurting and ROM is reduced.   She has a little neck pain.   Cervical spine MRI showed DJD but no nerve root impingement.  Gait:   She has had reduced gait for the past couple of years. Allergy is unclear multifactorial to arthritis and age. MRI of the cervical spine did not show any spinal stenosis.   Headaches:   Headaches have been better since starting Keppra.   Other:  Additionally, she had her  parathyroid gland removed and surgery went well.      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    Data:  I reviewed hospital notes, labs, imaging reports and reviewed actual MRI images.    MRI of the brain (personally reviewed) 02/04/2014 shows generalized cortical atrophy and moderate small vessel ischemic change with confluency is noted near the occipital and frontal horns of the lateral ventricles. No acute findings. EEG was unremarkable.     REVIEW OF SYSTEMS:  Constitutional: No fevers, chills, sweats, or change in appetite Eyes: No visual changes, double vision, eye pain Ear, nose and throat: No hearing loss, ear pain, nasal congestion, sore throat Cardiovascular: No chest pain, palpitations Respiratory:  No shortness of breath at rest or with exertion.   No wheezes GastrointestinaI: No nausea, vomiting, diarrhea, abdominal pain, fecal incontinence Genitourinary:  she has had some incontinence.   She has not had any since she left the hospital. Musculoskeletal:  She has RA with pain in hands and other joints Integumentary: No rash, pruritus, skin lesions Neurological: as above Psychiatric: No depression at this time.  No anxiety Endocrine: No palpitations, diaphoresis,  change in appetite, change in weigh or increased thirst Hematologic/Lymphatic:  No anemia, purpura, petechiae. Allergic/Immunologic: No itchy/runny eyes, nasal congestion, recent allergic reactions, rashes  ALLERGIES: No Known Allergies  HOME MEDICATIONS: Outpatient Prescriptions Prior to Visit  Medication Sig Dispense Refill  . acetaminophen (TYLENOL) 325 MG tablet Take 2 tablets (650 mg total) by mouth every 6 (six) hours as needed for mild pain (or Fever >/= 101).    [name] aspirin 81 MG tablet Take 81 mg by mouth daily.      [name] atorvastatin (LIPITOR) 20 MG tablet Take 20 mg by mouth daily. Reported on 03/24/2015    . HYDROcodone-acetaminophen (NORCO/VICODIN) 5-325 MG  tablet Take 1-2 tablets by mouth every 4 (four) hours as needed for moderate pain. 20 tablet 0  . levETIRAcetam (KEPPRA) 500 MG tablet Take 2 tablets (1,000 mg total) by mouth daily. 60 tablet 5   No facility-administered medications prior to visit.    PAST MEDICAL HISTORY: Past Medical History  Diagnosis Date  . Hypertension   . Hypercholesterolemia   . Depression   . Syncope and collapse   . Vision abnormalities   . Multiple falls   . Acute kidney injury superimposed on chronic kidney disease ([place]) 02/06/2015  . Kidney stones   . Heart murmur   . DVT (deep venous thrombosis) (HCC) 1970s    LLE  . Pneumonia "several times"  . Migraine     "a few/year" (07/16/2015)  . Seizure [facility])     "last one was 1//2017" (07/16/2015)  . Rheumatoid arthritis(714.0)     "all over" (07/16/2015)  . Anxiety     PAST SURGICAL HISTORY: Past Surgical History  Procedure Laterality Date  . Abdominal hysterectomy    . Carpal tunnel release Bilateral   . Cataract extraction w/ intraocular lens  implant, bilateral Bilateral   . Toenail avulsion Bilateral     great toe  . Neck exploration  07/16/2015  . Parathyroidectomy Right 07/16/2015    superior  . Parathyroidectomy Right 07/16/2015    Procedure: RIGHT INFERIOR PARATHYROIDECTOMY;  Surgeon: Armandina Gemma, MD;  Location: [place];  Service: General;  Laterality: Right;    FAMILY HISTORY: Family History  Problem Relation Age of Onset  . Cancer Father   . Prostate cancer Father   . Cancer Brother   . Heart disease Brother   . Miscarriages / [place] Brother   . Heart disease Mother   . Pneumonia Mother     SOCIAL HISTORY:  Social History   Social History  . Marital Status: Married    Spouse Name: N/A  . Number of Children: N/A  . Years of Education: N/A   Occupational History  . Not on file.   Social History Main Topics  . Smoking status: Current Every Day Smoker -- 0.12 packs/day for 61 years    Types: Cigarettes  . Smokeless  tobacco: Never Used  . Alcohol Use: No  . Drug Use: No  . Sexual Activity: No   Other Topics Concern  . Not on file   Social History Narrative     PHYSICAL EXAM  Filed Vitals:   08/03/15 1500  BP: 128/78  Pulse: 68  Resp: 16  Height: 5' (1.524 m)  Weight: 115 lb (52.164 kg)    Body mass index is 22.46 kg/(m^2).   General: The patient is well-developed and well-nourished and in no acute distress  Neck is nontender  Skin: Extremities are without significant edema.  Musculoskeletal:   Tender over glenohumeral  joint of right shoulder > subacromial bursa.    Mildly tender on the left.   ROM reduced in right shoulder.      Neurologic Exam  Mental status: The patient is alert and oriented x 3 at the time of the examination. The patient has good recent and remote memory (3/3), with a reduced attention span and concentration ability (WORLD- DRLOW).     Cranial nerves: Extraocular movements are full.  There is good facial sensation to soft touch bilaterally.Facial strength is normal.  Trapezius and sternocleidomastoid strength is normal. No dysarthria is noted.  The tongue is midline, and the patient has symmetric elevation of the soft palate. No obvious hearing deficits are noted.  Motor:  Muscle bulk and tone are normal. Strength is  4 / 5 proximally in legs and 5/5 right arm but 4/5 left triceps and extensors.   .   Sensory: Sensory testing is intact to touch, vibration sensation, and position sense on all 4 extremities.  Coordination: Cerebellar testing reveals good finger-nose-finger and bilaterally.  Gait and station: Needed help rising from chair.  Station is stable and gait has short stride and 6 step turn.   Romberg is negative.   Reflexes: Deep tendon reflexes are symmetric and normal in arms and increased with spread at knees bilaterally.     DIAGNOSTIC DATA (LABS, IMAGING, TESTING) - I reviewed patient records, labs, notes, testing and imaging myself where  available.  Lab Results  Component Value Date   WBC 8.9 07/13/2015   HGB 13.7 07/13/2015   HCT 41.5 07/13/2015   MCV 85.2 07/13/2015   PLT 338 07/13/2015      Component Value Date/Time   NA 137 07/17/2015 0511   K 4.5 07/17/2015 0511   CL 107 07/17/2015 0511   CO2 21* 07/17/2015 0511   GLUCOSE 108* 07/17/2015 0511   BUN 13 07/17/2015 0511   CREATININE 1.26* 07/17/2015 0511   CALCIUM 9.0 07/17/2015 0511   PROT 7.3 02/05/2015 2202   ALBUMIN 3.4* 02/05/2015 2202   AST 23 02/05/2015 2202   ALT 13* 02/05/2015 2202   ALKPHOS 98 02/05/2015 2202   BILITOT 0.3 02/05/2015 2202   GFRNONAA 40* 07/17/2015 0511   GFRAA 47* 07/17/2015 0511   Lab Results  Component Value Date   CHOL 165 02/06/2015   HDL 38* 02/06/2015   LDLCALC 104* 02/06/2015   TRIG 113 02/06/2015   CHOLHDL 4.3 02/06/2015   Lab Results  Component Value Date   HGBA1C 5.5 02/06/2015   Lab Results  Component Value Date   VITAMINB12 326 02/07/2015   Lab Results  Component Value Date   TSH 0.152* 02/07/2015      ASSESSMENT AND PLAN  Syncope and collapse  Gait disturbance  Neck pain  Localized pain of right shoulder joint    1.    Continue Keppra 1000 mg daily   2.    Stay active and exercises as tolerated. Use the cane for ambulation. 3.    Inject right glenohumeral joint of shoulder with 40 mg Depo-Medrol in [place].   She tolerated the injection well and there were no complications .   Pain and ROM much better afterward.    rtc 6 months for re-evaluation.   Call sooner if new or worsening neurologic symptoms   Jedadiah Abdallah A. Felecia Shelling, MD, PhD [id number], [id number] PM Certified in Neurology, Clinical Neurophysiology, Sleep Medicine, Pain Medicine and Neuroimaging  [facility] [address] [place], [place] [address] [phone]

## 2015-12-17 NOTE — Progress Notes (Signed)
Office Visit Note  Patient: [name]             Date of Birth: [birth year]           MRN: [id number]             PCP: Maximino Greenland, MD Referring: Glendale Chard, MD Visit Date: 12/20/2015 Occupation: @GUAROCC @    Subjective:  No chief complaint on file. Follow-up on rheumatoid arthritis high risk prescription.  History of Present Illness: [name] is a 75 y.o. female  Last seen in our office 06/15/2015 Patient was supposed to come back in 3 months but she did not. In July 2017 she had surgery for parathyroid hormone glands surgery according to the patient.  Today she is saying that she has pain in her right greater than left knee. She has been taking her sulfasalazine 1 pill in the morning on one pill in the evening. We have not given her Plaquenil in the long time because she has not given [place] a Plaquenil eye exam updated form. She goes yearly for an eye exam but the Plaquenil documentation has not been done and we do not have any copies of Plaquenil eye exam. We stopped giving her Plaquenil 06/16/2013.  She has contracture of her left elbow that was 10 on the 06/15/2015 visit today it looks like it's gotten worse.     Activities of Daily Living:  Patient reports morning stiffness for 3 hours.   Patient Reports nocturnal pain.  Difficulty dressing/grooming: Reports Difficulty climbing stairs: Reports Difficulty getting out of chair: Reports Difficulty using hands for taps, buttons, cutlery, and/or writing: Reports   Review of Systems  Constitutional: Negative for fatigue.  HENT: Negative for mouth sores and mouth dryness.   Eyes: Negative for dryness.  Respiratory: Negative for shortness of breath.   Gastrointestinal: Negative for constipation and diarrhea.  Musculoskeletal: Negative for myalgias and myalgias.  Skin: Negative for sensitivity to sunlight.  Psychiatric/Behavioral: Negative for decreased concentration and sleep disturbance.    PMFS  History:  Patient Active Problem List   Diagnosis Date Noted  . Chronic kidney disease, stage III (moderate) 08/03/2015  . Encounter for general adult medical examination without abnormal findings 08/03/2015  . 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 Other long term (current) drug therapy 08/03/2015  . Pain in right shoulder 08/03/2015  . Postmenopausal bleeding 08/03/2015  . Vitamin D deficiency 08/03/2015  . Localized pain of right shoulder joint 08/03/2015  . Primary hyperparathyroidism ([place]) 07/16/2015  . Hyperparathyroidism, primary ([place]) 07/14/2015  . Gait disturbance 03/24/2015  . Neck pain 03/24/2015  . Myalgia and myositis 03/24/2015  . Acute kidney injury superimposed on chronic kidney disease ([place]) 02/06/2015  . Leukocytosis 02/06/2015  . Seizure ([place]) 02/05/2015  . Acute encephalopathy 02/05/2015  . Abnormal MRI of head 02/02/2014  . Cognitive changes 02/02/2014  . Migraine without aura and without status migrainosus, not intractable 02/02/2014  . LOC (loss of consciousness) ([place])   . Syncope 01/12/2014  . Syncope and collapse 01/12/2014  . Hypertensive urgency, malignant 01/12/2014  . Rheumatoid arthritis ([place]) 01/12/2014  . Depression 01/12/2014  . Hyperlipidemia 01/12/2014    Past Medical History:  Diagnosis Date  . Acute kidney injury superimposed on chronic kidney disease ([place]) 02/06/2015  . Anxiety   . Depression   . DVT (deep venous thrombosis) (HCC) 1970s   LLE  . Heart murmur   .  Hypercholesterolemia   . Hypertension   . Kidney stones   . Migraine    "a few/year" (07/16/2015)  . Multiple falls   . Pneumonia "several times"  . Rheumatoid arthritis(714.0)    "all over" (07/16/2015)  . Seizure [facility])    "last one was 1//2017" (07/16/2015)  . Syncope and collapse   . Vision abnormalities     Family History  Problem Relation Age of Onset  . Cancer  Father   . Prostate cancer Father   . Cancer Brother   . Heart disease Brother   . Miscarriages / [place] Brother   . Heart disease Mother   . Pneumonia Mother    Past Surgical History:  Procedure Laterality Date  . ABDOMINAL HYSTERECTOMY    . CARPAL TUNNEL RELEASE Bilateral   . CATARACT EXTRACTION W/ INTRAOCULAR LENS  IMPLANT, BILATERAL Bilateral   . NECK EXPLORATION  07/16/2015  . PARATHYROIDECTOMY Right 07/16/2015   superior  . PARATHYROIDECTOMY Right 07/16/2015   Procedure: RIGHT INFERIOR PARATHYROIDECTOMY;  Surgeon: Armandina Gemma, MD;  Location: [place];  Service: General;  Laterality: Right;  . TOENAIL AVULSION Bilateral    great toe   Social History   Social History Narrative  . No narrative on file     Objective: Vital Signs: BP 139/72 (BP Location: Right Arm, Patient Position: Sitting, Cuff Size: Normal)   Pulse 79   Resp 14   Ht 5\' 1"  (1.549 m)   Wt 121 lb (54.9 kg)   BMI 22.86 kg/m    Physical Exam  Constitutional: She is oriented to person, place, and time. She appears well-developed and well-nourished.  HENT:  Head: Normocephalic and atraumatic.  Eyes: EOM are normal. Pupils are equal, round, and reactive to light.  Cardiovascular: Normal rate, regular rhythm and normal heart sounds.  Exam reveals no gallop and no friction rub.   No murmur heard. Pulmonary/Chest: Effort normal and breath sounds normal. She has no wheezes. She has no rales.  Abdominal: Soft. Bowel sounds are normal. She exhibits no distension. There is no tenderness. There is no guarding. No hernia.  Musculoskeletal: Normal range of motion. She exhibits no edema, tenderness or deformity.  Lymphadenopathy:    She has no cervical adenopathy.  Neurological: She is alert and oriented to person, place, and time. Coordination normal.  Skin: Skin is warm and dry. Capillary refill takes less than 2 seconds. No rash noted.  Psychiatric: She has a normal mood and affect. Her behavior is normal.    Nursing note and vitals reviewed.    Musculoskeletal Exam:  Fair range of motion of all joints including the shoulder joint except left elbow joint has 50 of contracture Grip strength is decreased bilaterally Fiber myalgia tender points are all absent  CDAI Exam: CDAI Homunculus Exam:   Joint Counts:  CDAI Tender Joint count: 0 CDAI Swollen Joint count: 0     Investigation: No additional findings. Labs from July 2017 shows CMP with GFR normal except for creatinine elevated to 1.41 and GFR low at 41. CBC with differential is within normal limits except for white count slightly elevated at 11.9 and platelet slightly elevated at 436,000  Imaging: No results found.  Speciality Comments: No specialty comments available.    Procedures:  Large Joint Inj Date/Time: 12/20/2015 9:13 AM Performed by: Eliezer Lofts Authorized by: Eliezer Lofts   Consent Given by:  Patient Site marked: the procedure site was marked   Timeout: prior to procedure the correct patient, procedure, and site  was verified   Indications:  Pain and joint swelling Location:  Knee Site:  R knee Prep: patient was prepped and draped in usual sterile fashion   Needle Size:  27 G Needle Length:  1.5 inches Approach:  Medial Ultrasound Guidance: No   Fluoroscopic Guidance: No   Arthrogram: No   Medications:  1.5 mL lidocaine 1 %; 40 mg triamcinolone acetonide 40 MG/ML Aspiration Attempted: Yes   Patient tolerance:  Patient tolerated the procedure well with no immediate complications  Right knee was given a cortisone injection secondary to pain in the right knee. After informed consent was obtained the site was prepped in usual sterile fashion and injected with 40 mg of Kenalog mixed with one half mL's 1% lidocaine. Patient tolerated procedure well. There is no complications.   Allergies: Patient has no known allergies.   Assessment / Plan:     Visit Diagnoses: Rheumatoid arthritis involving  multiple sites, unspecified rheumatoid factor presence (HCC)  High risk medications (not anticoagulants) long-term use - ssz 1 bid;  STOPPED plq 200mg  am & 100mg  evening DUE TO NO PLQ EYE EXAM SINCE JUNE 2015; - Plan: CBC with Differential/Platelet, COMPLETE METABOLIC PANEL WITH GFR  Acute kidney injury superimposed on chronic kidney disease (HCC)  Cognitive changes  Gait disturbance  Other long term (current) drug therapy  Bilateral chronic knee pain - Plan: XR KNEE 3 VIEW LEFT, XR KNEE 3 VIEW RIGHT  Pain in left elbow - Plan: XR Elbow 2 Views Left   Patient was last seen in our office on 06/15/2015. Right wrist with cyst on the dorsal aspect Left elbow joint pain  There appears to be synovitis in the left elbow and the right wrist. Patient would benefit from dual therapy that include Plaquenil because she hasn't had her Plaquenil eye exam done in the long time unable to refill the Plaquenil at this time. In the meanwhile she is only on the half strength of sulfasalazine. I would like to see her back in 3 months and see how well her labs are doing and how she is doing and adjust her medications accordingly. I have asked the patient to get a Plaquenil eye exam done or if one was done in the last 6 months to send me a copy of it to that I can restart her Plaquenil. Patient has a history of noncompliance in the past and I'm hopeful that we can get her on the proper medications to minimize the risk of rheumatoid arthritis to her joints.  In the meanwhile she is having more pain in the right knee than the left knee. I've injected the right knee today with cortisone. Please see procedure note for full details. Patient states that she is 75% better she probably will not be a candidate for Visco supplementation 5 minutes after the injection today.  CBC with differential CMP with GFR today  x-ray of bilateral knee joints today  Note that patient states that she had nausea and vomiting with the  sulfasalazine and that is the reason why we have her on one pill in the morning and 1 pill at night. Currently she is not complaining of that ON  today's visit.  Refill sulfasalazine 500 mg 1 in the morning when at night 90 day supply Do not get Plaquenil at this time Plaquenil eye exam due ASAP Return to clinic in 3 months       On f-u ; check on ===> RA, ssz, plq, right ej pain, bil knee  pain,  Orders: Orders Placed This Encounter  Procedures  . Large Joint Injection/Arthrocentesis  . XR KNEE 3 VIEW LEFT  . XR KNEE 3 VIEW RIGHT  . XR Elbow 2 Views Left  . CBC with Differential/Platelet  . COMPLETE METABOLIC PANEL WITH GFR   Meds ordered this encounter  Medications  . sulfaSALAzine (AZULFIDINE) 500 MG EC tablet    Sig: Take 1 tablet (500 mg total) by mouth 2 (two) times daily.    Dispense:  180 tablet    Refill:  0    Order Specific Question:   Supervising Provider    Answer:   Bo Merino [phone]    Face-to-face time spent with patient was 30 minutes. 50% of time was spent in counseling and coordination of care.  Follow-Up Instructions: Return in about 3 months (around 03/19/2016) for RA, ssz, plq, right ej pain, bil knee pain,.   Eliezer Lofts, PA-C   I examined and evaluated the patient with Eliezer Lofts PA. The plan of care was discussed as noted above.  Bo Merino, MD

## 2015-12-20 ENCOUNTER — Ambulatory Visit (INDEPENDENT_AMBULATORY_CARE_PROVIDER_SITE_OTHER): Payer: Medicare Other | Admitting: Rheumatology

## 2015-12-20 ENCOUNTER — Ambulatory Visit (INDEPENDENT_AMBULATORY_CARE_PROVIDER_SITE_OTHER): Payer: Medicare Other

## 2015-12-20 ENCOUNTER — Encounter: Payer: Self-pay | Admitting: Rheumatology

## 2015-12-20 VITALS — BP 139/72 | HR 79 | Resp 14 | Ht 61.0 in | Wt 121.0 lb

## 2015-12-20 DIAGNOSIS — M25562 Pain in left knee: Secondary | ICD-10-CM

## 2015-12-20 DIAGNOSIS — M25561 Pain in right knee: Secondary | ICD-10-CM

## 2015-12-20 DIAGNOSIS — N189 Chronic kidney disease, unspecified: Secondary | ICD-10-CM

## 2015-12-20 DIAGNOSIS — N179 Acute kidney failure, unspecified: Secondary | ICD-10-CM

## 2015-12-20 DIAGNOSIS — G8929 Other chronic pain: Secondary | ICD-10-CM | POA: Diagnosis not present

## 2015-12-20 DIAGNOSIS — M25522 Pain in left elbow: Secondary | ICD-10-CM | POA: Diagnosis not present

## 2015-12-20 DIAGNOSIS — R269 Unspecified abnormalities of gait and mobility: Secondary | ICD-10-CM

## 2015-12-20 DIAGNOSIS — R4189 Other symptoms and signs involving cognitive functions and awareness: Secondary | ICD-10-CM | POA: Diagnosis not present

## 2015-12-20 DIAGNOSIS — Z79899 Other long term (current) drug therapy: Secondary | ICD-10-CM

## 2015-12-20 DIAGNOSIS — M069 Rheumatoid arthritis, unspecified: Secondary | ICD-10-CM

## 2015-12-20 LAB — COMPLETE METABOLIC PANEL WITH GFR
ALT: 13 U/L (ref 6–29)
AST: 25 U/L (ref 10–35)
Albumin: 4.2 g/dL (ref 3.6–5.1)
Alkaline Phosphatase: 116 U/L (ref 33–130)
BUN: 26 mg/dL — AB (ref 7–25)
CHLORIDE: 107 mmol/L (ref 98–110)
CO2: 26 mmol/L (ref 20–31)
Calcium: 9.5 mg/dL (ref 8.6–10.4)
Creat: 1.26 mg/dL — ABNORMAL HIGH (ref 0.60–0.93)
GFR, Est African American: 47 mL/min — ABNORMAL LOW (ref >=60)
GFR, Est Non African American: 41 mL/min — ABNORMAL LOW (ref >=60)
GLUCOSE: 66 mg/dL (ref 65–99)
POTASSIUM: 4 mmol/L (ref 3.5–5.3)
SODIUM: 140 mmol/L (ref 135–146)
Total Bilirubin: 0.5 mg/dL (ref 0.2–1.2)
Total Protein: 8.2 g/dL — ABNORMAL HIGH (ref 6.1–8.1)

## 2015-12-20 LAB — CBC WITH DIFFERENTIAL/PLATELET
Basophils Absolute: 92 cells/uL (ref 0–200)
Basophils Relative: 1 %
EOS PCT: 1 %
Eosinophils Absolute: 92 cells/uL (ref 15–500)
HCT: 43.4 % (ref 35.0–45.0)
Hemoglobin: 14.6 g/dL (ref 11.7–15.5)
LYMPHS PCT: 21 %
Lymphs Abs: 1932 cells/uL (ref 850–3900)
MCH: 29.8 pg (ref 27.0–33.0)
MCHC: 33.6 g/dL (ref 32.0–36.0)
MCV: 88.6 fL (ref 80.0–100.0)
MPV: 10.1 fL (ref 7.5–12.5)
Monocytes Absolute: 736 cells/uL (ref 200–950)
Monocytes Relative: 8 %
NEUTROS PCT: 69 %
Neutro Abs: 6348 cells/uL (ref 1500–7800)
PLATELETS: 330 10*3/uL (ref 140–400)
RBC: 4.9 MIL/uL (ref 3.80–5.10)
RDW: 14.3 % (ref 11.0–15.0)
WBC: 9.2 10*3/uL (ref 3.8–10.8)

## 2015-12-20 MED ORDER — SULFASALAZINE 500 MG PO TBEC: 500.0000 mg | DELAYED_RELEASE_TABLET | Freq: Two times a day (BID) | ORAL | 0 refills | Status: DC

## 2015-12-20 MED ORDER — TRIAMCINOLONE ACETONIDE 40 MG/ML IJ SUSP
40.0000 mg | INTRAMUSCULAR | Status: AC | PRN
  Administered 2015-12-20: 40 mg via INTRA_ARTICULAR

## 2015-12-20 MED ORDER — LIDOCAINE HCL 1 % IJ SOLN
1.5000 mL | INTRAMUSCULAR | Status: AC | PRN
  Administered 2015-12-20: 1.5 mL

## 2015-12-21 NOTE — Progress Notes (Signed)
Tell patient:   #1: CMP with GFR shows elevated creatinine 1.26. Similar to her past labs 5 months ago in 6 months agoGFR is low at 48.Hx of : chronic kidney disease   #2: CBC with differential is normal Remind patient that we need Plaquenil eye exam urgently so we can start her back on Plaquenil. On last examination she was only on sulfasalazine and she was having a flare. Adding Plaquenil may be able to address her flare.

## 2016-02-03 ENCOUNTER — Ambulatory Visit: Payer: Medicare Other | Admitting: Neurology

## 2016-03-06 ENCOUNTER — Ambulatory Visit: Payer: Medicare Other | Admitting: Neurology

## 2016-03-20 ENCOUNTER — Ambulatory Visit: Payer: Medicare Other | Admitting: Rheumatology

## 2016-04-20 DIAGNOSIS — G8929 Other chronic pain: Secondary | ICD-10-CM | POA: Insufficient documentation

## 2016-04-20 DIAGNOSIS — M25522 Pain in left elbow: Secondary | ICD-10-CM | POA: Insufficient documentation

## 2016-04-20 DIAGNOSIS — M25562 Pain in left knee: Secondary | ICD-10-CM

## 2016-04-20 DIAGNOSIS — M25561 Pain in right knee: Secondary | ICD-10-CM

## 2016-04-20 DIAGNOSIS — Z79899 Other long term (current) drug therapy: Secondary | ICD-10-CM | POA: Insufficient documentation

## 2016-04-20 NOTE — Progress Notes (Signed)
Office Visit Note  Patient: [name]             Date of Birth: May 21, 1940           MRN: [id number]             PCP: Maximino Greenland, MD Referring: Glendale Chard, MD Visit Date: 04/24/2016 Occupation: '@GUAROCC' @    Subjective:  Pain of the Right Knee and Follow-up   History of Present Illness: [name] is a 76 y.o. female  Last seen about 4 months ago. Since then patient has not on her eye exam done as requested at the last visit. Her last Plaquenil eye exam was sometime in 2015. We have asked her on several occasions to get an updated Plaquenil eye exam but has been unable to follow through for one reason or another. Today she states that she thought that the eye doctor was in the contact her for her Plaquenil eye exam. Result of this frequent confusion, I called Dr. Zenia Resides office today and spoke with Jackelyn Poling and made an appointment for patient to get the Plaquenil eye exam done tomorrow about 145pm    Activities of Daily Living:  Patient reports morning stiffness for 60 minute.   Patient Reports nocturnal pain.  Difficulty dressing/grooming: Reports Difficulty climbing stairs: Reports Difficulty getting out of chair: Reports Difficulty using hands for taps, buttons, cutlery, and/or writing: Reports   Review of Systems  Constitutional: Negative for fatigue.  HENT: Negative for mouth sores and mouth dryness.   Eyes: Negative for dryness.  Respiratory: Negative for shortness of breath.   Gastrointestinal: Negative for constipation and diarrhea.  Musculoskeletal: Negative for myalgias and myalgias.  Skin: Negative for sensitivity to sunlight.  Psychiatric/Behavioral: Negative for decreased concentration and sleep disturbance.    PMFS History:  Patient Active Problem List   Diagnosis Date Noted  . High risk medications (not anticoagulants) long-term use 04/20/2016  . Bilateral chronic knee pain 04/20/2016  . Pain in left elbow 04/20/2016  . Chronic  kidney disease, stage III (moderate) 08/03/2015  . Encounter for general adult medical examination without abnormal findings 08/03/2015  . 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 Other long term (current) drug therapy 08/03/2015  . Pain in right shoulder 08/03/2015  . Postmenopausal bleeding 08/03/2015  . Vitamin D deficiency 08/03/2015  . Localized pain of right shoulder joint 08/03/2015  . Primary hyperparathyroidism ([place]) 07/16/2015  . Hyperparathyroidism, primary ([place]) 07/14/2015  . Gait disturbance 03/24/2015  . Neck pain 03/24/2015  . Myalgia and myositis 03/24/2015  . Acute kidney injury superimposed on chronic kidney disease ([place]) 02/06/2015  . Leukocytosis 02/06/2015  . Seizure ([place]) 02/05/2015  . Acute encephalopathy 02/05/2015  . Abnormal MRI of head 02/02/2014  . Cognitive changes 02/02/2014  . Migraine without aura and without status migrainosus, not intractable 02/02/2014  . LOC (loss of consciousness) ([place])   . Syncope 01/12/2014  . Syncope and collapse 01/12/2014  . Hypertensive urgency, malignant 01/12/2014  . Rheumatoid arthritis ([place]) 01/12/2014  . Depression 01/12/2014  . Hyperlipidemia 01/12/2014    Past Medical History:  Diagnosis Date  . Acute kidney injury superimposed on chronic kidney disease ([place]) 02/06/2015  . Anxiety   . Depression   . DVT (deep venous thrombosis) (HCC) 1970s   LLE  . Heart murmur   . Hypercholesterolemia   . Hypertension   . Kidney stones   .  Migraine    "a few/year" (07/16/2015)  . Multiple falls   . Pneumonia "several times"  . Rheumatoid arthritis(714.0)    "all over" (07/16/2015)  . Seizure [facility])    "last one was 1//2017" (07/16/2015)  . Syncope and collapse   . Vision abnormalities     Family History  Problem Relation Age of Onset  . Cancer Father   . Prostate cancer Father   . Cancer Brother   . Heart disease  Brother   . Miscarriages / [place] Brother   . Heart disease Mother   . Pneumonia Mother    Past Surgical History:  Procedure Laterality Date  . ABDOMINAL HYSTERECTOMY    . CARPAL TUNNEL RELEASE Bilateral   . CATARACT EXTRACTION W/ INTRAOCULAR LENS  IMPLANT, BILATERAL Bilateral   . NECK EXPLORATION  07/16/2015  . PARATHYROIDECTOMY Right 07/16/2015   superior  . PARATHYROIDECTOMY Right 07/16/2015   Procedure: RIGHT INFERIOR PARATHYROIDECTOMY;  Surgeon: Armandina Gemma, MD;  Location: [facility];  Service: General;  Laterality: Right;  . TOENAIL AVULSION Bilateral    great toe   Social History   Social History Narrative  . No narrative on file     Objective: Vital Signs: BP (!) 160/100   Pulse 80   Resp 14   Ht '5\' 1"'  (1.549 m)   Wt 125 lb (56.7 kg)   BMI 23.62 kg/m    Physical Exam  Constitutional: She is oriented to person, place, and time. She appears well-developed and well-nourished.  HENT:  Head: Normocephalic and atraumatic.  Eyes: EOM are normal. Pupils are equal, round, and reactive to light.  Cardiovascular: Normal rate, regular rhythm and normal heart sounds.  Exam reveals no gallop and no friction rub.   No murmur heard. Pulmonary/Chest: Effort normal and breath sounds normal. She has no wheezes. She has no rales.  Abdominal: Soft. Bowel sounds are normal. She exhibits no distension. There is no tenderness. There is no guarding. No hernia.  Musculoskeletal: Normal range of motion. She exhibits no edema, tenderness or deformity.  Lymphadenopathy:    She has no cervical adenopathy.  Neurological: She is alert and oriented to person, place, and time. Coordination normal.  Skin: Skin is warm and dry. Capillary refill takes less than 2 seconds. No rash noted.  Psychiatric: She has a normal mood and affect. Her behavior is normal.  Nursing note and vitals reviewed.    Musculoskeletal Exam:  Full range of motion of all joints Grip strength is equal and strong  bilaterally Fiber myalgia tender points are all absent  CDAI Exam: CDAI Homunculus Exam:   Tenderness:  RLE: tibiofemoral LLE: tibiofemoral  Joint Counts:  CDAI Tender Joint count: 2 CDAI Swollen Joint count: 0  Global Assessments:  Patient Global Assessment: 7 Provider Global Assessment: 7  CDAI Calculated Score: 16    Investigation: Findings:  Labs from July 2017 shows CMP with GFR normal except for creatinine elevated to 1.41 and GFR low at 41. CBC with differential is within normal limits except for white count slightly elevated at 11.9 and platelet slightly elevated at 436,000  Plaquenil eye exam due ASAP  Office Visit on 12/20/2015  Component Date Value Ref Range Status  . WBC 12/20/2015 9.2  3.8 - 10.8 K/uL Final  . RBC 12/20/2015 4.90  3.80 - 5.10 MIL/uL Final  . Hemoglobin 12/20/2015 14.6  11.7 - 15.5 g/dL Final  . HCT 12/20/2015 43.4  35.0 - 45.0 % Final  . MCV 12/20/2015 88.6  80.0 -  100.0 fL Final  . MCH 12/20/2015 29.8  27.0 - 33.0 pg Final  . MCHC 12/20/2015 33.6  32.0 - 36.0 g/dL Final  . RDW 12/20/2015 14.3  11.0 - 15.0 % Final  . Platelets 12/20/2015 330  140 - 400 K/uL Final  . MPV 12/20/2015 10.1  7.5 - 12.5 fL Final  . Neutro Abs 12/20/2015 6348  1,500 - 7,800 cells/uL Final  . Lymphs Abs 12/20/2015 1932  850 - 3,900 cells/uL Final  . Monocytes Absolute 12/20/2015 736  200 - 950 cells/uL Final  . Eosinophils Absolute 12/20/2015 92  15 - 500 cells/uL Final  . Basophils Absolute 12/20/2015 92  0 - 200 cells/uL Final  . Neutrophils Relative % 12/20/2015 69  % Final  . Lymphocytes Relative 12/20/2015 21  % Final  . Monocytes Relative 12/20/2015 8  % Final  . Eosinophils Relative 12/20/2015 1  % Final  . Basophils Relative 12/20/2015 1  % Final  . Smear Review 12/20/2015 Criteria for review not met   Final  . Sodium 12/20/2015 140  135 - 146 mmol/L Final  . Potassium 12/20/2015 4.0  3.5 - 5.3 mmol/L Final  . Chloride 12/20/2015 107  98 - 110 mmol/L  Final  . CO2 12/20/2015 26  20 - 31 mmol/L Final  . Glucose, Bld 12/20/2015 66  65 - 99 mg/dL Final  . BUN 12/20/2015 26* 7 - 25 mg/dL Final  . Creat 12/20/2015 1.26* 0.60 - 0.93 mg/dL Final   Comment:   For patients > or = 78 years of age: The upper reference limit for Creatinine is approximately 13% higher for people identified as African-American.     . Total Bilirubin 12/20/2015 0.5  0.2 - 1.2 mg/dL Final  . Alkaline Phosphatase 12/20/2015 116  33 - 130 U/L Final  . AST 12/20/2015 25  10 - 35 U/L Final  . ALT 12/20/2015 13  6 - 29 U/L Final  . Total Protein 12/20/2015 8.2* 6.1 - 8.1 g/dL Final  . Albumin 12/20/2015 4.2  3.6 - 5.1 g/dL Final  . Calcium 12/20/2015 9.5  8.6 - 10.4 mg/dL Final  . GFR, Est African American 12/20/2015 47* >=60 mL/min Final  . GFR, Est Non African American 12/20/2015 41* >=60 mL/min Final      Imaging: No results found.          Speciality Comments: No specialty comments available.    Procedures:  Large Joint Inj Date/Time: 04/24/2016 12:05 PM Performed by: Eliezer Lofts Authorized by: Eliezer Lofts   Consent Given by:  Patient Site marked: the procedure site was marked   Timeout: prior to procedure the correct patient, procedure, and site was verified   Indications:  Pain and joint swelling Location:  Knee Site:  R knee Prep: patient was prepped and draped in usual sterile fashion   Needle Size:  27 G Needle Length:  1.5 inches Approach:  Medial Ultrasound Guidance: No   Fluoroscopic Guidance: No   Arthrogram: No   Medications:  1.5 mL lidocaine 1 %; 40 mg triamcinolone acetonide 40 MG/ML Aspiration Attempted: Yes   Patient tolerance:  Patient tolerated the procedure well with no immediate complications  Significant pain to right > left knee. Wants injection to right knee.   Allergies: Patient has no known allergies.   Assessment / Plan:     Visit Diagnoses: Rheumatoid arthritis involving multiple sites, unspecified  rheumatoid factor presence (HCC)  High risk medications (not anticoagulants) long-term use - SSZ-571m 1 [id number]-2072mam &  144m evening  STOPPED DUE TO NO PLQ EYE EXAM SINCE JUNE 2015 - Plan: CBC with Differential/Platelet, COMPLETE METABOLIC PANEL WITH GFR, VITAMIN D 25 Hydroxy (Vit-D Deficiency, Fractures)  Acute kidney injury superimposed on chronic kidney disease (HCC)  Cognitive changes  Gait disturbance  Other long term (current) drug therapy  Bilateral chronic knee pain  Pain in left elbow  Fatigue, unspecified type - Plan: VITAMIN D 25 Hydroxy (Vit-D Deficiency, Fractures)   Plan: #1: Rheumatoid arthritis. Patient is currently on sulfasalazine. We want to give her Plaquenil but we have been unable to because we have had no Plaquenil eye exam since 2015. I currently made an appointment for the patient myself with Dr. GVelvet Batheoffice so that we can have an urgent Plaquenil eye exam up-to-date so that we can safely give the patient Plaquenil medication.  #2: Bilateral knee joint pain with right worse than left. As the patient was coming into our office, she almost fell today. She complained that the right knee was hurting so bad he gives away at times. Cup luckily I was able to catch her before she fell. I recommend Rollator for the patient.  #3: CBC with differential CMP with GFR #4: Refill sulfasalazine Upper 5: Plan: I plan to give her Plaquenil once again once it's established that her Plaquenil eye exam is normal  #4: Note that patient has been getting Plaquenil from her PCPs office. I reminded the patient that the reason why I'm not giving it is because her Plaquenil eye exam is not up-to-date. To circumvent me not giving it and getting it from PCP is risking her eyes.  #5: Right greater than left knee pain. After informed consent was obtained the site was usual sterile fashion injected the right knee with 40 mg of Kenalog mixed with one half mL's 1% lidocaine.  Patient tolerated procedure well.   #6: Patient is having significant pain in the pain was so bad when she came into our office that the right knee almost gave way and she almost fell. I was standing near the patient knows able to capture. She did not fall. I was able to walk with her and assist her and she was roomed to make sure that she does not fall.  #7: Return to clinic in 4 months  Orders: Orders Placed This Encounter  Procedures  . Large Joint Injection/Arthrocentesis  . CBC with Differential/Platelet  . COMPLETE METABOLIC PANEL WITH GFR  . VITAMIN D 25 Hydroxy (Vit-D Deficiency, Fractures)   Meds ordered this encounter  Medications  . sulfaSALAzine (AZULFIDINE) 500 MG EC tablet    Sig: Take 1 tablet (500 mg total) by mouth 2 (two) times daily.    Dispense:  180 tablet    Refill:  0    Order Specific Question:   Supervising Provider    Answer:   DBo Merino[[phone]   Face-to-face time spent with patient was 30 minutes. 50% of time was spent in counseling and coordination of care.  Follow-Up Instructions: No Follow-up on file.   NEliezer Lofts PA-C  Note - This record has been created using [employer]  Chart creation errors have been sought, but may not always  have been located. Such creation errors do not reflect on  the standard of medical care.

## 2016-04-24 ENCOUNTER — Ambulatory Visit (INDEPENDENT_AMBULATORY_CARE_PROVIDER_SITE_OTHER): Payer: Medicare PPO | Admitting: Rheumatology

## 2016-04-24 ENCOUNTER — Encounter: Payer: Self-pay | Admitting: Rheumatology

## 2016-04-24 ENCOUNTER — Ambulatory Visit: Payer: Medicare Other | Admitting: Rheumatology

## 2016-04-24 VITALS — BP 160/100 | HR 80 | Resp 14 | Ht 61.0 in | Wt 125.0 lb

## 2016-04-24 DIAGNOSIS — M25561 Pain in right knee: Secondary | ICD-10-CM | POA: Diagnosis not present

## 2016-04-24 DIAGNOSIS — N189 Chronic kidney disease, unspecified: Secondary | ICD-10-CM

## 2016-04-24 DIAGNOSIS — R269 Unspecified abnormalities of gait and mobility: Secondary | ICD-10-CM

## 2016-04-24 DIAGNOSIS — R4189 Other symptoms and signs involving cognitive functions and awareness: Secondary | ICD-10-CM | POA: Diagnosis not present

## 2016-04-24 DIAGNOSIS — Z79899 Other long term (current) drug therapy: Secondary | ICD-10-CM | POA: Diagnosis not present

## 2016-04-24 DIAGNOSIS — M25522 Pain in left elbow: Secondary | ICD-10-CM

## 2016-04-24 DIAGNOSIS — Z9114 Patient's other noncompliance with medication regimen: Secondary | ICD-10-CM

## 2016-04-24 DIAGNOSIS — G8929 Other chronic pain: Secondary | ICD-10-CM | POA: Diagnosis not present

## 2016-04-24 DIAGNOSIS — N179 Acute kidney failure, unspecified: Secondary | ICD-10-CM | POA: Diagnosis not present

## 2016-04-24 DIAGNOSIS — M069 Rheumatoid arthritis, unspecified: Secondary | ICD-10-CM | POA: Diagnosis not present

## 2016-04-24 DIAGNOSIS — R5383 Other fatigue: Secondary | ICD-10-CM | POA: Diagnosis not present

## 2016-04-24 DIAGNOSIS — M25562 Pain in left knee: Secondary | ICD-10-CM

## 2016-04-24 DIAGNOSIS — M170 Bilateral primary osteoarthritis of knee: Secondary | ICD-10-CM | POA: Diagnosis not present

## 2016-04-24 DIAGNOSIS — E559 Vitamin D deficiency, unspecified: Secondary | ICD-10-CM | POA: Diagnosis not present

## 2016-04-24 LAB — CBC WITH DIFFERENTIAL/PLATELET
BASOS PCT: 1 %
Basophils Absolute: 104 cells/uL (ref 0–200)
EOS PCT: 1 %
Eosinophils Absolute: 104 cells/uL (ref 15–500)
HEMATOCRIT: 43.7 % (ref 35.0–45.0)
Hemoglobin: 14.6 g/dL (ref 11.7–15.5)
LYMPHS PCT: 21 %
Lymphs Abs: 2184 cells/uL (ref 850–3900)
MCH: 29.5 pg (ref 27.0–33.0)
MCHC: 33.4 g/dL (ref 32.0–36.0)
MCV: 88.3 fL (ref 80.0–100.0)
MONO ABS: 728 {cells}/uL (ref 200–950)
MPV: 10.9 fL (ref 7.5–12.5)
Monocytes Relative: 7 %
Neutro Abs: 7280 cells/uL (ref 1500–7800)
Neutrophils Relative %: 70 %
Platelets: 352 10*3/uL (ref 140–400)
RBC: 4.95 MIL/uL (ref 3.80–5.10)
RDW: 14.3 % (ref 11.0–15.0)
WBC: 10.4 10*3/uL (ref 3.8–10.8)

## 2016-04-24 LAB — COMPLETE METABOLIC PANEL WITH GFR
ALBUMIN: 4.2 g/dL (ref 3.6–5.1)
ALK PHOS: 106 U/L (ref 33–130)
ALT: 7 U/L (ref 6–29)
AST: 17 U/L (ref 10–35)
BILIRUBIN TOTAL: 0.5 mg/dL (ref 0.2–1.2)
BUN: 24 mg/dL (ref 7–25)
CALCIUM: 9.6 mg/dL (ref 8.6–10.4)
CO2: 23 mmol/L (ref 20–31)
Chloride: 109 mmol/L (ref 98–110)
Creat: 1.23 mg/dL — ABNORMAL HIGH (ref 0.60–0.93)
GFR, EST NON AFRICAN AMERICAN: 42 mL/min — AB (ref >=60)
GFR, Est African American: 49 mL/min — ABNORMAL LOW (ref >=60)
GLUCOSE: 81 mg/dL (ref 65–99)
Potassium: 4.7 mmol/L (ref 3.5–5.3)
SODIUM: 142 mmol/L (ref 135–146)
TOTAL PROTEIN: 8.3 g/dL — AB (ref 6.1–8.1)

## 2016-04-24 MED ORDER — TRIAMCINOLONE ACETONIDE 40 MG/ML IJ SUSP
40.0000 mg | INTRAMUSCULAR | Status: AC | PRN
  Administered 2016-04-24: 40 mg via INTRA_ARTICULAR

## 2016-04-24 MED ORDER — LIDOCAINE HCL 1 % IJ SOLN
1.5000 mL | INTRAMUSCULAR | Status: AC | PRN
  Administered 2016-04-24: 1.5 mL

## 2016-04-24 MED ORDER — SULFASALAZINE 500 MG PO TBEC: 500.0000 mg | DELAYED_RELEASE_TABLET | Freq: Two times a day (BID) | ORAL | 0 refills | Status: DC

## 2016-04-25 DIAGNOSIS — M059 Rheumatoid arthritis with rheumatoid factor, unspecified: Secondary | ICD-10-CM | POA: Diagnosis not present

## 2016-04-25 DIAGNOSIS — Z961 Presence of intraocular lens: Secondary | ICD-10-CM | POA: Diagnosis not present

## 2016-04-25 DIAGNOSIS — H04123 Dry eye syndrome of bilateral lacrimal glands: Secondary | ICD-10-CM | POA: Diagnosis not present

## 2016-04-25 DIAGNOSIS — Z79899 Other long term (current) drug therapy: Secondary | ICD-10-CM | POA: Diagnosis not present

## 2016-04-25 LAB — VITAMIN D 25 HYDROXY (VIT D DEFICIENCY, FRACTURES): VIT D 25 HYDROXY: 18 ng/mL — AB (ref 30–100)

## 2016-04-27 ENCOUNTER — Telehealth: Payer: Self-pay | Admitting: *Deleted

## 2016-04-27 MED ORDER — VITAMIN D (ERGOCALCIFEROL) 1.25 MG (50000 UNIT) PO CAPS: 50000.0000 [IU] | ORAL_CAPSULE | ORAL | 0 refills | Status: DC

## 2016-04-27 NOTE — Telephone Encounter (Signed)
-----   Message from Eliezer Lofts, [place] sent at 04/25/2016  9:21 AM EDT ----- Send copy of labs to PCP, Dr. Bryon Lions.  #1: I saw the patient yesterday and because her Plaquenil eye exam was past due, I called Dr. Velvet Bathe office myself and set up an appointment for the patient for today. I wrote down the time and date. She supposed to go this afternoon. We you please remind her of this appointment in case she forgot  #2: Vitamin D is low at 18. We need to treat it. Dispense vitamin D3 50,000 IUs 2 times a week 3 months; dispensed 24 pills Repeat labs in 3 months vitamin D 68 OH  #3: CBC with differential is normal  #4: CMP with GFR shows creatinine high at 1.23 (consistent with previous labs) and GFR low at 49. Total protein is 8.3 (on December 2017, her total protein was 8.2.)

## 2016-05-29 ENCOUNTER — Ambulatory Visit: Payer: Medicare Other | Admitting: Neurology

## 2016-06-26 DIAGNOSIS — M25561 Pain in right knee: Secondary | ICD-10-CM | POA: Diagnosis not present

## 2016-06-26 DIAGNOSIS — Z0000 Encounter for general adult medical examination without abnormal findings: Secondary | ICD-10-CM | POA: Diagnosis not present

## 2016-06-26 DIAGNOSIS — E559 Vitamin D deficiency, unspecified: Secondary | ICD-10-CM | POA: Diagnosis not present

## 2016-06-26 DIAGNOSIS — E215 Disorder of parathyroid gland, unspecified: Secondary | ICD-10-CM | POA: Diagnosis not present

## 2016-06-26 DIAGNOSIS — I129 Hypertensive chronic kidney disease with stage 1 through stage 4 chronic kidney disease, or unspecified chronic kidney disease: Secondary | ICD-10-CM | POA: Diagnosis not present

## 2016-06-26 DIAGNOSIS — N183 Chronic kidney disease, stage 3 (moderate): Secondary | ICD-10-CM | POA: Diagnosis not present

## 2016-06-26 DIAGNOSIS — M069 Rheumatoid arthritis, unspecified: Secondary | ICD-10-CM | POA: Diagnosis not present

## 2016-08-10 ENCOUNTER — Inpatient Hospital Stay (HOSPITAL_COMMUNITY)
Admission: EM | Admit: 2016-08-10 | Discharge: 2016-08-14 | DRG: 872 | Disposition: A | Discharge status: Skilled Nursing Facility | Payer: Medicare PPO | Attending: Internal Medicine | Admitting: Internal Medicine

## 2016-08-10 ENCOUNTER — Emergency Department (HOSPITAL_COMMUNITY): Payer: Medicare PPO

## 2016-08-10 ENCOUNTER — Encounter (HOSPITAL_COMMUNITY): Payer: Self-pay | Admitting: Emergency Medicine

## 2016-08-10 DIAGNOSIS — F329 Major depressive disorder, single episode, unspecified: Secondary | ICD-10-CM | POA: Diagnosis present

## 2016-08-10 DIAGNOSIS — R112 Nausea with vomiting, unspecified: Secondary | ICD-10-CM | POA: Diagnosis present

## 2016-08-10 DIAGNOSIS — N179 Acute kidney failure, unspecified: Secondary | ICD-10-CM | POA: Diagnosis present

## 2016-08-10 DIAGNOSIS — M069 Rheumatoid arthritis, unspecified: Secondary | ICD-10-CM | POA: Diagnosis present

## 2016-08-10 DIAGNOSIS — N189 Chronic kidney disease, unspecified: Secondary | ICD-10-CM

## 2016-08-10 DIAGNOSIS — R739 Hyperglycemia, unspecified: Secondary | ICD-10-CM | POA: Diagnosis not present

## 2016-08-10 DIAGNOSIS — R4182 Altered mental status, unspecified: Secondary | ICD-10-CM | POA: Diagnosis not present

## 2016-08-10 DIAGNOSIS — R197 Diarrhea, unspecified: Secondary | ICD-10-CM | POA: Diagnosis present

## 2016-08-10 DIAGNOSIS — E785 Hyperlipidemia, unspecified: Secondary | ICD-10-CM | POA: Diagnosis present

## 2016-08-10 DIAGNOSIS — R51 Headache: Secondary | ICD-10-CM | POA: Diagnosis present

## 2016-08-10 DIAGNOSIS — R569 Unspecified convulsions: Secondary | ICD-10-CM

## 2016-08-10 DIAGNOSIS — N183 Chronic kidney disease, stage 3 (moderate): Secondary | ICD-10-CM | POA: Diagnosis present

## 2016-08-10 DIAGNOSIS — G9340 Encephalopathy, unspecified: Secondary | ICD-10-CM | POA: Diagnosis not present

## 2016-08-10 DIAGNOSIS — R6511 Systemic inflammatory response syndrome (SIRS) of non-infectious origin with acute organ dysfunction: Secondary | ICD-10-CM | POA: Diagnosis not present

## 2016-08-10 DIAGNOSIS — E876 Hypokalemia: Secondary | ICD-10-CM | POA: Diagnosis present

## 2016-08-10 DIAGNOSIS — E7800 Pure hypercholesterolemia, unspecified: Secondary | ICD-10-CM | POA: Diagnosis present

## 2016-08-10 DIAGNOSIS — R2689 Other abnormalities of gait and mobility: Secondary | ICD-10-CM | POA: Diagnosis not present

## 2016-08-10 DIAGNOSIS — I674 Hypertensive encephalopathy: Secondary | ICD-10-CM | POA: Diagnosis present

## 2016-08-10 DIAGNOSIS — I160 Hypertensive urgency: Secondary | ICD-10-CM | POA: Diagnosis present

## 2016-08-10 DIAGNOSIS — G40909 Epilepsy, unspecified, not intractable, without status epilepticus: Secondary | ICD-10-CM | POA: Diagnosis present

## 2016-08-10 DIAGNOSIS — A419 Sepsis, unspecified organism: Secondary | ICD-10-CM | POA: Diagnosis not present

## 2016-08-10 DIAGNOSIS — M6281 Muscle weakness (generalized): Secondary | ICD-10-CM | POA: Diagnosis not present

## 2016-08-10 DIAGNOSIS — R6510 Systemic inflammatory response syndrome (SIRS) of non-infectious origin without acute organ dysfunction: Secondary | ICD-10-CM | POA: Diagnosis present

## 2016-08-10 DIAGNOSIS — E860 Dehydration: Secondary | ICD-10-CM | POA: Diagnosis present

## 2016-08-10 DIAGNOSIS — D72829 Elevated white blood cell count, unspecified: Secondary | ICD-10-CM | POA: Diagnosis present

## 2016-08-10 DIAGNOSIS — Z79899 Other long term (current) drug therapy: Secondary | ICD-10-CM

## 2016-08-10 DIAGNOSIS — F419 Anxiety disorder, unspecified: Secondary | ICD-10-CM | POA: Diagnosis present

## 2016-08-10 DIAGNOSIS — I129 Hypertensive chronic kidney disease with stage 1 through stage 4 chronic kidney disease, or unspecified chronic kidney disease: Secondary | ICD-10-CM | POA: Diagnosis present

## 2016-08-10 DIAGNOSIS — Z7982 Long term (current) use of aspirin: Secondary | ICD-10-CM | POA: Diagnosis not present

## 2016-08-10 DIAGNOSIS — N178 Other acute kidney failure: Secondary | ICD-10-CM | POA: Diagnosis not present

## 2016-08-10 DIAGNOSIS — I10 Essential (primary) hypertension: Secondary | ICD-10-CM | POA: Diagnosis not present

## 2016-08-10 LAB — COMPREHENSIVE METABOLIC PANEL
ALBUMIN: 4.2 g/dL (ref 3.5–5.0)
ALT: 10 U/L — ABNORMAL LOW (ref 14–54)
ANION GAP: 14 (ref 5–15)
AST: 27 U/L (ref 15–41)
Alkaline Phosphatase: 107 U/L (ref 38–126)
BILIRUBIN TOTAL: 0.9 mg/dL (ref 0.3–1.2)
BUN: 26 mg/dL — ABNORMAL HIGH (ref 6–20)
CHLORIDE: 103 mmol/L (ref 101–111)
CO2: 20 mmol/L — ABNORMAL LOW (ref 22–32)
Calcium: 9.4 mg/dL (ref 8.9–10.3)
Creatinine, Ser: 2.05 mg/dL — ABNORMAL HIGH (ref 0.44–1.00)
GFR calc Af Amer: 26 mL/min — ABNORMAL LOW (ref >60)
GFR, EST NON AFRICAN AMERICAN: 22 mL/min — AB (ref >60)
GLUCOSE: 185 mg/dL — AB (ref 65–99)
POTASSIUM: 3.7 mmol/L (ref 3.5–5.1)
Sodium: 137 mmol/L (ref 135–145)
TOTAL PROTEIN: 8.7 g/dL — AB (ref 6.5–8.1)

## 2016-08-10 LAB — I-STAT CHEM 8, ED
BUN: 31 mg/dL — ABNORMAL HIGH (ref 6–20)
CALCIUM ION: 1.02 mmol/L — AB (ref 1.15–1.40)
Chloride: 105 mmol/L (ref 101–111)
Creatinine, Ser: 1.9 mg/dL — ABNORMAL HIGH (ref 0.44–1.00)
Glucose, Bld: 190 mg/dL — ABNORMAL HIGH (ref 65–99)
HCT: 53 % — ABNORMAL HIGH (ref 36.0–46.0)
Hemoglobin: 18 g/dL — ABNORMAL HIGH (ref 12.0–15.0)
Potassium: 3.8 mmol/L (ref 3.5–5.1)
SODIUM: 139 mmol/L (ref 135–145)
TCO2: 24 mmol/L (ref 0–100)

## 2016-08-10 LAB — URINALYSIS, ROUTINE W REFLEX MICROSCOPIC
Bilirubin Urine: NEGATIVE
GLUCOSE, UA: NEGATIVE mg/dL
Ketones, ur: NEGATIVE mg/dL
Leukocytes, UA: NEGATIVE
Nitrite: NEGATIVE
PH: 6 (ref 5.0–8.0)
Protein, ur: 100 mg/dL — AB
SPECIFIC GRAVITY, URINE: 1.006 (ref 1.005–1.030)

## 2016-08-10 LAB — CBC
HCT: 46.8 % — ABNORMAL HIGH (ref 36.0–46.0)
HEMOGLOBIN: 16.6 g/dL — AB (ref 12.0–15.0)
MCH: 29.6 pg (ref 26.0–34.0)
MCHC: 35.5 g/dL (ref 30.0–36.0)
MCV: 83.4 fL (ref 78.0–100.0)
Platelets: 306 10*3/uL (ref 150–400)
RBC: 5.61 MIL/uL — ABNORMAL HIGH (ref 3.87–5.11)
RDW: 14 % (ref 11.5–15.5)
WBC: 15.3 10*3/uL — ABNORMAL HIGH (ref 4.0–10.5)

## 2016-08-10 LAB — DIFFERENTIAL
BASOS PCT: 0 %
Basophils Absolute: 0 10*3/uL (ref 0.0–0.1)
EOS ABS: 0 10*3/uL (ref 0.0–0.7)
EOS PCT: 0 %
LYMPHS ABS: 1.7 10*3/uL (ref 0.7–4.0)
Lymphocytes Relative: 11 %
Monocytes Absolute: 1.2 10*3/uL — ABNORMAL HIGH (ref 0.1–1.0)
Monocytes Relative: 8 %
NEUTROS PCT: 81 %
Neutro Abs: 12.4 10*3/uL — ABNORMAL HIGH (ref 1.7–7.7)

## 2016-08-10 LAB — I-STAT TROPONIN, ED: TROPONIN I, POC: 0.08 ng/mL (ref 0.00–0.08)

## 2016-08-10 LAB — PROTIME-INR
INR: 1.04
PROTHROMBIN TIME: 13.6 s (ref 11.4–15.2)

## 2016-08-10 LAB — APTT: APTT: 30 s (ref 24–36)

## 2016-08-10 LAB — CBG MONITORING, ED: GLUCOSE-CAPILLARY: 184 mg/dL — AB (ref 65–99)

## 2016-08-10 LAB — I-STAT CG4 LACTIC ACID, ED
LACTIC ACID, VENOUS: 2.52 mmol/L — AB (ref 0.5–1.9)
Lactic Acid, Venous: 2.72 mmol/L (ref 0.5–1.9)

## 2016-08-10 MED ORDER — VANCOMYCIN HCL IN DEXTROSE 1-5 GM/200ML-% IV SOLN
1000.0000 mg | Freq: Once | INTRAVENOUS | Status: AC
  Administered 2016-08-10: 1000 mg via INTRAVENOUS
  Filled 2016-08-10: qty 200

## 2016-08-10 MED ORDER — SODIUM CHLORIDE 0.9 % IV SOLN
500.0000 mg | INTRAVENOUS | Status: DC
  Filled 2016-08-10: qty 500

## 2016-08-10 MED ORDER — LABETALOL HCL 5 MG/ML IV SOLN
10.0000 mg | Freq: Once | INTRAVENOUS | Status: AC
  Administered 2016-08-10: 10 mg via INTRAVENOUS
  Filled 2016-08-10: qty 4

## 2016-08-10 MED ORDER — DEXTROSE 5 % IV SOLN: 1.0000 g | Freq: Once | INTRAVENOUS | Status: DC

## 2016-08-10 MED ORDER — SODIUM CHLORIDE 0.9 % IV BOLUS (SEPSIS)
500.0000 mL | Freq: Once | INTRAVENOUS | Status: AC
  Administered 2016-08-10: 500 mL via INTRAVENOUS

## 2016-08-10 MED ORDER — SODIUM CHLORIDE 0.9 % IV BOLUS (SEPSIS)
1000.0000 mL | Freq: Once | INTRAVENOUS | Status: AC
  Administered 2016-08-10: 1000 mL via INTRAVENOUS

## 2016-08-10 MED ORDER — MIDAZOLAM HCL 2 MG/2ML IJ SOLN
2.0000 mg | Freq: Once | INTRAMUSCULAR | Status: AC
  Administered 2016-08-10: 2 mg via INTRAVENOUS

## 2016-08-10 MED ORDER — SODIUM CHLORIDE 0.9 % IV BOLUS (SEPSIS)
250.0000 mL | Freq: Once | INTRAVENOUS | Status: AC
  Administered 2016-08-10: 250 mL via INTRAVENOUS

## 2016-08-10 MED ORDER — MIDAZOLAM HCL 2 MG/2ML IJ SOLN
INTRAMUSCULAR | Status: AC
  Filled 2016-08-10: qty 2

## 2016-08-10 MED ORDER — CEFTRIAXONE SODIUM 2 G IJ SOLR
2.0000 g | Freq: Once | INTRAMUSCULAR | Status: AC
  Administered 2016-08-10: 2 g via INTRAVENOUS
  Filled 2016-08-10: qty 2

## 2016-08-10 NOTE — ED Notes (Signed)
ED Provider at bedside. 

## 2016-08-10 NOTE — ED Triage Notes (Signed)
Per pt, "ive been having migraine headaches for three days". Pt has poor appetite. Pt weak, per family has been weak for a while. Hx of arthritis. Vomited yesterday.

## 2016-08-10 NOTE — ED Triage Notes (Signed)
Pt husband now here statign she was very confused last night. Pt is answering all orientation quetsions correctly today. Equal grip strength, no facial droop.

## 2016-08-10 NOTE — ED Notes (Signed)
Pt's husband [phone]

## 2016-08-10 NOTE — Progress Notes (Signed)
Pharmacy Antibiotic Note [name] is a 76 y.o. female admitted on 08/10/2016 with sepsis and to r/o meningitis. Pt received ceftriaxone 2 grams IV x1 and vancomycin 1000 mg IV x 1 in ED. Pharmacy has been consulted for vancomycin dosing. SCr noted to elevated from baseline at 1.92.   Plan: 1. Vancomycin 500 IV every 24 hours.  Goal trough 15-20 mcg/mL.  2. Follow up on continuing ceftriaxone.  Weight: 123 lb 7.3 oz (56 kg)  Temp (24hrs), Avg:99.7 F (37.6 C), Min:99.7 F (37.6 C), Max:99.7 F (37.6 C)   Recent Labs Lab 08/10/16 1703 08/10/16 1720  WBC 15.3*  --   CREATININE 2.05* 1.90*  LATICACIDVEN  --  2.52*    Estimated Creatinine Clearance: 18.4 mL/min (A) (by C-G formula based on SCr of 1.9 mg/dL (H)).    No Known Allergies  Antimicrobials this admission: 7/26 ceftriaxone x 1 dose 7/26 vancomycin >>   Microbiology results: px  Thank you for allowing pharmacy to be a part of this patient's care.  Vincenza Hews, PharmD, BCPS 08/10/2016, 7:57 PM

## 2016-08-10 NOTE — ED Provider Notes (Signed)
[facility] DEPT Provider Note   CSN: [id number] Arrival date & time: 08/10/16  1622     History   Chief Complaint Chief Complaint  Patient presents with  . Headache    HPI [name] is a 76 y.o. female with several medical comorbidities including hypertension, rheumatoid arthritis who is coming in today with altered mental status. Her caregiver stated that over the last 5 days she has had worsening altered mental status and headache as well that started 5 days ago. No nausea or vomiting but states she has had decreased by mouth intake as well as chills. Has not complained of chest pain, nausea, vomiting, abdominal pain. Has not seen a physician before today.  HPI  Past Medical History:  Diagnosis Date  . Acute kidney injury superimposed on chronic kidney disease ([place]) 02/06/2015  . Anxiety   . Depression   . DVT (deep venous thrombosis) (HCC) 1970s   LLE  . Heart murmur   . Hypercholesterolemia   . Hypertension   . Kidney stones   . Migraine    "a few/year" (07/16/2015)  . Multiple falls   . Pneumonia "several times"  . Rheumatoid arthritis(714.0)    "all over" (07/16/2015)  . Seizure [facility])    "last one was 1//2017" (07/16/2015)  . Syncope and collapse   . Vision abnormalities     Patient Active Problem List   Diagnosis Date Noted  . High risk medications (not anticoagulants) long-term use 04/20/2016  . Bilateral chronic knee pain 04/20/2016  . Pain in left elbow 04/20/2016  . Chronic kidney disease, stage III (moderate) 08/03/2015  . Encounter for general adult medical examination without abnormal findings 08/03/2015  . 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 Other long term (current) drug therapy 08/03/2015  . Pain in right shoulder 08/03/2015  . Postmenopausal bleeding 08/03/2015  . Vitamin D deficiency 08/03/2015  . Localized pain of right shoulder  joint 08/03/2015  . Primary hyperparathyroidism ([place]) 07/16/2015  . Hyperparathyroidism, primary ([place]) 07/14/2015  . Gait disturbance 03/24/2015  . Neck pain 03/24/2015  . Myalgia and myositis 03/24/2015  . Acute kidney injury superimposed on chronic kidney disease ([place]) 02/06/2015  . Leukocytosis 02/06/2015  . Seizure ([place]) 02/05/2015  . Acute encephalopathy 02/05/2015  . Abnormal MRI of head 02/02/2014  . Cognitive changes 02/02/2014  . Migraine without aura and without status migrainosus, not intractable 02/02/2014  . LOC (loss of consciousness) ([place])   . Syncope 01/12/2014  . Syncope and collapse 01/12/2014  . Hypertensive urgency, malignant 01/12/2014  . Rheumatoid arthritis ([place]) 01/12/2014  . Depression 01/12/2014  . Hyperlipidemia 01/12/2014    Past Surgical History:  Procedure Laterality Date  . ABDOMINAL HYSTERECTOMY    . CARPAL TUNNEL RELEASE Bilateral   . CATARACT EXTRACTION W/ INTRAOCULAR LENS  IMPLANT, BILATERAL Bilateral   . NECK EXPLORATION  07/16/2015  . PARATHYROIDECTOMY Right 07/16/2015   superior  . PARATHYROIDECTOMY Right 07/16/2015   Procedure: RIGHT INFERIOR PARATHYROIDECTOMY;  Surgeon: Armandina Gemma, MD;  Location: [facility];  Service: General;  Laterality: Right;  . TOENAIL AVULSION Bilateral    great toe    OB History    No data available       Home Medications    Prior to Admission medications   Medication Sig Start Date End Date Taking? Authorizing Provider  aspirin 81 MG tablet Take 81 mg by mouth daily.     Yes  [provider]  aspirin-acetaminophen-caffeine (EXCEDRIN MIGRAINE) [phone] MG tablet Take 2 tablets by mouth every 6 (six) hours as needed for headache or migraine.   Yes [provider]  atorvastatin (LIPITOR) 20 MG tablet Take 20 mg by mouth daily. Reported on 03/24/2015   Yes [provider]  carvedilol (COREG) 6.25 MG tablet Take 6.25 mg by mouth daily.  08/02/15  Yes [provider]    hydroxychloroquine (PLAQUENIL) 200 MG tablet Take 200 mg by mouth daily.  08/02/15  Yes [provider]  levETIRAcetam (KEPPRA) 500 MG tablet Take 2 tablets (1,000 mg total) by mouth daily. 08/03/15  Yes Sater, Nanine Means, MD  sulfaSALAzine (AZULFIDINE) 500 MG EC tablet Take 1 tablet (500 mg total) by mouth 2 (two) times daily. 04/24/16 08/10/16 Yes Panwala, Naitik, PA-C  Vitamin D, Ergocalciferol, (DRISDOL) 50000 units CAPS capsule Take 1 capsule (50,000 Units total) by mouth 2 (two) times a week. Patient taking differently: Take 50,000 Units by mouth every 7 (seven) days.  04/27/16  Yes Panwala, Naitik, PA-C  acetaminophen (TYLENOL) 325 MG tablet Take 2 tablets (650 mg total) by mouth every 6 (six) hours as needed for mild pain (or Fever >/= 101). Patient not taking: Reported on 04/24/2016 01/14/14   Delfina Redwood, MD  HYDROcodone-acetaminophen (NORCO/VICODIN) 5-325 MG tablet Take 1-2 tablets by mouth every 4 (four) hours as needed for moderate pain. Patient not taking: Reported on 12/20/2015 07/17/15   Armandina Gemma, MD  Vitamin D, Ergocalciferol, (DRISDOL) 50000 units CAPS capsule Take 50,000 Units by mouth once a week.  08/02/15   [provider]    Family History Family History  Problem Relation Age of Onset  . Cancer Father   . Prostate cancer Father   . Cancer Brother   . Heart disease Brother   . Miscarriages / [place] Brother   . Heart disease Mother   . Pneumonia Mother     Social History Social History  Substance Use Topics  . Smoking status: Current Every Day Smoker    Packs/day: 0.12    Years: 61.00    Types: Cigarettes  . Smokeless tobacco: Never Used  . Alcohol use No     Allergies   Patient has no known allergies.   Review of Systems Review of Systems  Constitutional: Negative for fever.  HENT: Negative for sore throat.   Eyes: Negative for redness.  Respiratory: Negative for cough and shortness of breath.   Cardiovascular: Negative for  chest pain.  Gastrointestinal: Negative for diarrhea, nausea and vomiting.  Genitourinary: Negative for dysuria and hematuria.  Musculoskeletal: Negative for neck pain.  Skin: Negative for rash.  Neurological: Positive for weakness and headaches. Negative for speech difficulty.  Psychiatric/Behavioral: Negative for agitation.     Physical Exam Updated Vital Signs BP (!) 200/117   Pulse (!) 103   Temp 99.7 F (37.6 C) (Oral)   Resp 19   Wt 56 kg (123 lb 7.3 oz)   SpO2 97%   BMI 23.33 kg/m   Physical Exam  Constitutional: She is oriented to person, place, and time. She appears well-developed and well-nourished. No distress.  Tired appearing  HENT:  Head: Normocephalic and atraumatic.  Eyes: Pupils are equal, round, and reactive to light. Conjunctivae and EOM are normal.  Neck: Normal range of motion. Neck supple.  Cardiovascular: Regular rhythm and intact distal pulses.   Tachycardia  Pulmonary/Chest: Effort normal. No respiratory distress.  Abdominal: Soft. She exhibits no distension. There is no tenderness.  Musculoskeletal: She exhibits no edema or deformity.  Neurological: She is alert and oriented to person, place, and time.  Skin: Skin is warm and dry.  Psychiatric:  Unable to assess  Nursing note and vitals reviewed.    ED Treatments / Results  Labs (all labs ordered are listed, but only abnormal results are displayed) Labs Reviewed  CBC - Abnormal; Notable for the following:       Result Value   WBC 15.3 (*)    RBC 5.61 (*)    Hemoglobin 16.6 (*)    HCT 46.8 (*)    All other components within normal limits  DIFFERENTIAL - Abnormal; Notable for the following:    Neutro Abs 12.4 (*)    Monocytes Absolute 1.2 (*)    All other components within normal limits  COMPREHENSIVE METABOLIC PANEL - Abnormal; Notable for the following:    CO2 20 (*)    Glucose, Bld 185 (*)    BUN 26 (*)    Creatinine, Ser 2.05 (*)    Total Protein 8.7 (*)    ALT 10 (*)    GFR  calc non Af Amer 22 (*)    GFR calc Af Amer 26 (*)    All other components within normal limits  URINALYSIS, ROUTINE W REFLEX MICROSCOPIC - Abnormal; Notable for the following:    APPearance HAZY (*)    Hgb urine dipstick SMALL (*)    Protein, ur 100 (*)    Bacteria, UA MANY (*)    Squamous Epithelial / LPF 0-5 (*)    All other components within normal limits  CBG MONITORING, ED - Abnormal; Notable for the following:    Glucose-Capillary 184 (*)    All other components within normal limits  I-STAT CHEM 8, ED - Abnormal; Notable for the following:    BUN 31 (*)    Creatinine, Ser 1.90 (*)    Glucose, Bld 190 (*)    Calcium, Ion 1.02 (*)    Hemoglobin 18.0 (*)    HCT 53.0 (*)    All other components within normal limits  I-STAT CG4 LACTIC ACID, ED - Abnormal; Notable for the following:    Lactic Acid, Venous 2.52 (*)    All other components within normal limits  I-STAT CG4 LACTIC ACID, ED - Abnormal; Notable for the following:    Lactic Acid, Venous 2.72 (*)    All other components within normal limits  CULTURE, BLOOD (ROUTINE X 2)  CULTURE, BLOOD (ROUTINE X 2)  CSF CULTURE  GRAM STAIN  PROTIME-INR  APTT  CSF CELL COUNT WITH DIFFERENTIAL  CSF CELL COUNT WITH DIFFERENTIAL  PROTEIN AND GLUCOSE, CSF  CRYPTOCOCCAL ANTIGEN, CSF  ROCKY MTN SPOTTED FVR ABS PNL(IGG+IGM)  EHRLICHIA ANTIBODY PANEL  I-STAT TROPONIN, ED    EKG  EKG Interpretation  Date/Time:  Thursday August 10 2016 16:53:13 EDT Ventricular Rate:  121 PR Interval:  134 QRS Duration: 68 QT Interval:  324 QTC Calculation: 460 R Axis:   19 Text Interpretation:  Sinus tachycardia Right atrial enlargement Borderline ECG tachycardia new from previous Confirmed by Theotis Burrow [phone]) on 08/10/2016 6:08:40 PM       Radiology Dg Chest 2 View  Result Date: 08/10/2016 CLINICAL DATA:  admitted on 08/10/2016 with sepsis and to r/o meningitis. Pt has AMS and leukocytosis. EXAM: CHEST  2 VIEW COMPARISON:  02/06/2015  FINDINGS: Normal heart size and pulmonary vascularity. No focal airspace disease or consolidation in the lungs. No blunting of costophrenic angles. No  pneumothorax. Mediastinal contours appear intact. Calcified and tortuous aorta. Degenerative changes in the spine. Surgical clips in the right upper quadrant. IMPRESSION: No active cardiopulmonary disease. Electronically Signed   By: Lucienne Capers M.D.   On: 08/10/2016 20:16   Ct Head Wo Contrast  Result Date: 08/10/2016 CLINICAL DATA:  Confusion and headaches EXAM: CT HEAD WITHOUT CONTRAST TECHNIQUE: Contiguous axial images were obtained from the base of the skull through the vertex without intravenous contrast. COMPARISON:  02/05/2015 FINDINGS: Brain: Mild atrophic changes and chronic white matter ischemic change is seen. 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 One lies within the right thalamus, head of the caudate nucleus on the left as well as within the internal capsule on left. No findings to suggest acute hemorrhage, acute infarction or space-occupying mass lesion are seen. Vascular: No hyperdense vessel or unexpected calcification. Skull: Normal. Negative for fracture or focal lesion. Sinuses/Orbits: No acute finding. Other: None. IMPRESSION: Chronic atrophic and ischemic changes. No acute abnormality is noted. Electronically Signed   By: Inez Catalina M.D.   On: 08/10/2016 18:06    Procedures .Lumbar Puncture Date/Time: 08/10/2016 11:52 PM Performed by: Valda Lamb Authorized by: Valda Lamb   Consent:    Consent obtained:  Verbal   Consent given by:  Spouse   Risks discussed:  Bleeding, infection, pain, repeat procedure, nerve damage and headache   Alternatives discussed:  No treatment and delayed treatment Pre-procedure details:    Procedure purpose:  Diagnostic   Preparation: Patient was prepped and draped in usual sterile fashion   Sedation:    Sedation type:  Anxiolysis Anesthesia (see MAR for exact dosages):    Anesthesia  method:  Local infiltration   Local anesthetic:  Lidocaine 1% WITH epi Procedure details:    Lumbar space:  L3-L4 interspace   Patient position:  R lateral decubitus   Needle gauge:  20   Needle type:  Spinal needle - Quincke tip   Needle length (in):  1.5   Ultrasound guidance: no     Number of attempts:  5 or more   Total volume (ml):  0 Post-procedure:    Puncture site:  Adhesive bandage applied Comments:     Unsuccessful, IR consulted   (including critical care time)  EMERGENCY DEPARTMENT  US GUIDANCE EXAM Emergency Ultrasound:  US Guidance for Needle Guidance  INDICATIONS: Difficult vascular access Linear probe used in real-time to visualize location of needle entry through skin.   PERFORMED BY: Myself IMAGES ARCHIVED?: No LIMITATIONS: Pain VIEWS USED: Transverse INTERPRETATION: Needle visualized within vein, Left arm and Needle gauge 20  Medications Ordered in ED Medications  vancomycin (VANCOCIN) 500 mg in sodium chloride 0.9 % 100 mL IVPB (not administered)  sodium chloride 0.9 % bolus 1,000 mL (0 mLs Intravenous Stopped 08/10/16 2051)  sodium chloride 0.9 % bolus 1,000 mL (0 mLs Intravenous Stopped 08/10/16 2123)    And  sodium chloride 0.9 % bolus 500 mL (0 mLs Intravenous Stopped 08/10/16 2208)    And  sodium chloride 0.9 % bolus 250 mL (0 mLs Intravenous Stopped 08/10/16 2136)  vancomycin (VANCOCIN) IVPB 1000 mg/200 mL premix (0 mg Intravenous Stopped 08/10/16 2343)  labetalol (NORMODYNE,TRANDATE) injection 10 mg (10 mg Intravenous Given 08/10/16 2053)  cefTRIAXone (ROCEPHIN) 2 g in dextrose 5 % 50 mL IVPB (0 g Intravenous Stopped 08/10/16 2124)  midazolam (VERSED) injection 2 mg (2 mg Intravenous Given 08/10/16 2312)     Initial Impression / Assessment and Plan / ED Course  I have reviewed  the triage vital signs and the nursing notes.  Pertinent labs & imaging results that were available during my care of the patient were reviewed by me and considered in my  medical decision making (see chart for details).     Presenting with worsening headache over the last 5 days. Also associated decreased by mouth intake and general lethargy. No acute inciting events. CT head ordered in triage is negative for acute abnormalities. Patient with elevated lactic acid as well as leukocytosis. Chest x-ray shows No acute cardiopulmonary abnormality, and urinalysis is Unremarkable for urinary tract infection. Patient is hypertensive and was given labetalol here in the emergency department. On exam, she will follow commands and her neurological exam is afocal. Doubt stroke. Also considering negative head CT, doubt acute intracranial bleed or mass.  Considering patient's vitals and altered mental status, sepsis protocol started. Ordered Rocephin and vancomycin. Attempted lumbar puncture several times myself as well as the attending without success, interventional radiology was consulted for lumbar puncture to assess for meningitis.  Lactic acid initially elevated at 2.52, with repeat 2.72 three hours later after fluid boluses. Patient with acute kidney injury as well. Troponin negative. Leukocytosis to 15.3. Hemoglobin of 16.6. Hospitalist was consulted while in the emergency department.  Patient will be admitted to the hospitalist for further evaluation and management. She was stable while under my care in the emergency department.  Patient was seen with my attending, Dr. Rex Kras, who voiced agreement and oversaw the evaluation and treatment of this patient.   Dragon Field seismologist was used in the creation of this note. If there are any errors or inconsistencies needing clarification, please contact me directly.   Final Clinical Impressions(s) / ED Diagnoses   Final diagnoses:  Altered mental status, unspecified altered mental status type    New Prescriptions New Prescriptions   No medications on file        Valda Lamb, MD 08/11/16 [place],  Wenda Overland, MD 08/11/16 1233

## 2016-08-11 ENCOUNTER — Encounter (HOSPITAL_COMMUNITY): Payer: Self-pay | Admitting: Internal Medicine

## 2016-08-11 ENCOUNTER — Emergency Department (HOSPITAL_COMMUNITY): Payer: Medicare PPO

## 2016-08-11 DIAGNOSIS — Z7982 Long term (current) use of aspirin: Secondary | ICD-10-CM | POA: Diagnosis not present

## 2016-08-11 DIAGNOSIS — N189 Chronic kidney disease, unspecified: Secondary | ICD-10-CM

## 2016-08-11 DIAGNOSIS — R112 Nausea with vomiting, unspecified: Secondary | ICD-10-CM | POA: Diagnosis present

## 2016-08-11 DIAGNOSIS — G9340 Encephalopathy, unspecified: Secondary | ICD-10-CM | POA: Diagnosis not present

## 2016-08-11 DIAGNOSIS — E876 Hypokalemia: Secondary | ICD-10-CM | POA: Diagnosis present

## 2016-08-11 DIAGNOSIS — R6510 Systemic inflammatory response syndrome (SIRS) of non-infectious origin without acute organ dysfunction: Secondary | ICD-10-CM | POA: Diagnosis present

## 2016-08-11 DIAGNOSIS — N179 Acute kidney failure, unspecified: Secondary | ICD-10-CM | POA: Diagnosis present

## 2016-08-11 DIAGNOSIS — I129 Hypertensive chronic kidney disease with stage 1 through stage 4 chronic kidney disease, or unspecified chronic kidney disease: Secondary | ICD-10-CM | POA: Diagnosis present

## 2016-08-11 DIAGNOSIS — R739 Hyperglycemia, unspecified: Secondary | ICD-10-CM | POA: Diagnosis not present

## 2016-08-11 DIAGNOSIS — M069 Rheumatoid arthritis, unspecified: Secondary | ICD-10-CM | POA: Diagnosis present

## 2016-08-11 DIAGNOSIS — N183 Chronic kidney disease, stage 3 (moderate): Secondary | ICD-10-CM | POA: Diagnosis present

## 2016-08-11 DIAGNOSIS — D72829 Elevated white blood cell count, unspecified: Secondary | ICD-10-CM | POA: Diagnosis present

## 2016-08-11 DIAGNOSIS — F419 Anxiety disorder, unspecified: Secondary | ICD-10-CM | POA: Diagnosis present

## 2016-08-11 DIAGNOSIS — E860 Dehydration: Secondary | ICD-10-CM | POA: Diagnosis present

## 2016-08-11 DIAGNOSIS — G40909 Epilepsy, unspecified, not intractable, without status epilepticus: Secondary | ICD-10-CM | POA: Diagnosis present

## 2016-08-11 DIAGNOSIS — R197 Diarrhea, unspecified: Secondary | ICD-10-CM

## 2016-08-11 DIAGNOSIS — E785 Hyperlipidemia, unspecified: Secondary | ICD-10-CM | POA: Diagnosis present

## 2016-08-11 DIAGNOSIS — E7800 Pure hypercholesterolemia, unspecified: Secondary | ICD-10-CM | POA: Diagnosis present

## 2016-08-11 DIAGNOSIS — F329 Major depressive disorder, single episode, unspecified: Secondary | ICD-10-CM | POA: Diagnosis present

## 2016-08-11 DIAGNOSIS — R51 Headache: Secondary | ICD-10-CM | POA: Diagnosis present

## 2016-08-11 DIAGNOSIS — Z79899 Other long term (current) drug therapy: Secondary | ICD-10-CM | POA: Diagnosis not present

## 2016-08-11 DIAGNOSIS — I160 Hypertensive urgency: Secondary | ICD-10-CM | POA: Diagnosis present

## 2016-08-11 DIAGNOSIS — I674 Hypertensive encephalopathy: Secondary | ICD-10-CM | POA: Diagnosis present

## 2016-08-11 LAB — CRYPTOCOCCAL ANTIGEN, CSF: Crypto Ag: NEGATIVE

## 2016-08-11 LAB — CBC WITH DIFFERENTIAL/PLATELET
BASOS PCT: 0 %
Basophils Absolute: 0.1 10*3/uL (ref 0.0–0.1)
EOS ABS: 0 10*3/uL (ref 0.0–0.7)
Eosinophils Relative: 0 %
HCT: 41.3 % (ref 36.0–46.0)
HEMOGLOBIN: 14.4 g/dL (ref 12.0–15.0)
LYMPHS ABS: 2.3 10*3/uL (ref 0.7–4.0)
Lymphocytes Relative: 18 %
MCH: 29.3 pg (ref 26.0–34.0)
MCHC: 34.9 g/dL (ref 30.0–36.0)
MCV: 83.9 fL (ref 78.0–100.0)
Monocytes Absolute: 1.4 10*3/uL — ABNORMAL HIGH (ref 0.1–1.0)
Monocytes Relative: 11 %
NEUTROS ABS: 9 10*3/uL — AB (ref 1.7–7.7)
NEUTROS PCT: 71 %
Platelets: 260 10*3/uL (ref 150–400)
RBC: 4.92 MIL/uL (ref 3.87–5.11)
RDW: 14.1 % (ref 11.5–15.5)
WBC: 12.8 10*3/uL — AB (ref 4.0–10.5)

## 2016-08-11 LAB — COMPREHENSIVE METABOLIC PANEL
ALT: 10 U/L — AB (ref 14–54)
ANION GAP: 10 (ref 5–15)
AST: 23 U/L (ref 15–41)
Albumin: 3.5 g/dL (ref 3.5–5.0)
Alkaline Phosphatase: 85 U/L (ref 38–126)
BUN: 20 mg/dL (ref 6–20)
CHLORIDE: 107 mmol/L (ref 101–111)
CO2: 22 mmol/L (ref 22–32)
CREATININE: 1.75 mg/dL — AB (ref 0.44–1.00)
Calcium: 8.4 mg/dL — ABNORMAL LOW (ref 8.9–10.3)
GFR, EST AFRICAN AMERICAN: 31 mL/min — AB (ref >60)
GFR, EST NON AFRICAN AMERICAN: 27 mL/min — AB (ref >60)
Glucose, Bld: 117 mg/dL — ABNORMAL HIGH (ref 65–99)
Potassium: 3.3 mmol/L — ABNORMAL LOW (ref 3.5–5.1)
Sodium: 139 mmol/L (ref 135–145)
Total Bilirubin: 0.7 mg/dL (ref 0.3–1.2)
Total Protein: 7.2 g/dL (ref 6.5–8.1)

## 2016-08-11 LAB — CSF CELL COUNT WITH DIFFERENTIAL
RBC COUNT CSF: 2 /mm3 — AB
Tube #: 1
WBC, CSF: 1 /mm3 (ref 0–5)

## 2016-08-11 LAB — GLUCOSE, CSF: GLUCOSE CSF: 81 mg/dL — AB (ref 40–70)

## 2016-08-11 LAB — CREATININE, URINE, RANDOM: Creatinine, Urine: 45.41 mg/dL

## 2016-08-11 LAB — SODIUM, URINE, RANDOM: SODIUM UR: 91 mmol/L

## 2016-08-11 LAB — PROTEIN, CSF: TOTAL PROTEIN, CSF: 50 mg/dL — AB (ref 15–45)

## 2016-08-11 LAB — MRSA PCR SCREENING: MRSA BY PCR: NEGATIVE

## 2016-08-11 LAB — TSH: TSH: 0.139 u[IU]/mL — AB (ref 0.350–4.500)

## 2016-08-11 MED ORDER — ONDANSETRON HCL 4 MG/2ML IJ SOLN: 4.0000 mg | Freq: Four times a day (QID) | INTRAMUSCULAR | Status: DC | PRN

## 2016-08-11 MED ORDER — DEXTROSE 5 % IV SOLN
2.0000 g | Freq: Two times a day (BID) | INTRAVENOUS | Status: DC
  Administered 2016-08-11: 2 g via INTRAVENOUS
  Filled 2016-08-11 (×2): qty 2

## 2016-08-11 MED ORDER — SODIUM CHLORIDE 0.9 % IV SOLN
INTRAVENOUS | Status: AC
  Administered 2016-08-11 (×2): via INTRAVENOUS
  Administered 2016-08-11: 1000 mL via INTRAVENOUS

## 2016-08-11 MED ORDER — POTASSIUM CHLORIDE CRYS ER 20 MEQ PO TBCR
20.0000 meq | EXTENDED_RELEASE_TABLET | Freq: Once | ORAL | Status: AC
  Administered 2016-08-11: 20 meq via ORAL
  Filled 2016-08-11: qty 1

## 2016-08-11 MED ORDER — DOXYCYCLINE HYCLATE 100 MG IV SOLR
100.0000 mg | Freq: Two times a day (BID) | INTRAVENOUS | Status: DC
  Administered 2016-08-11 – 2016-08-12 (×3): 100 mg via INTRAVENOUS
  Filled 2016-08-11 (×4): qty 100

## 2016-08-11 MED ORDER — LEVETIRACETAM 500 MG PO TABS
1000.0000 mg | ORAL_TABLET | Freq: Every day | ORAL | Status: DC
  Administered 2016-08-11 – 2016-08-14 (×4): 1000 mg via ORAL
  Filled 2016-08-11 (×4): qty 2

## 2016-08-11 MED ORDER — ACETAMINOPHEN 325 MG PO TABS
650.0000 mg | ORAL_TABLET | Freq: Four times a day (QID) | ORAL | Status: DC | PRN
  Administered 2016-08-11 – 2016-08-13 (×2): 650 mg via ORAL
  Filled 2016-08-11 (×2): qty 2

## 2016-08-11 MED ORDER — ACETAMINOPHEN 650 MG RE SUPP: 650.0000 mg | Freq: Four times a day (QID) | RECTAL | Status: DC | PRN

## 2016-08-11 MED ORDER — LIDOCAINE HCL (PF) 1 % IJ SOLN
INTRAMUSCULAR | Status: AC
  Administered 2016-08-11: 5 mL via PERCUTANEOUS
  Filled 2016-08-11: qty 10

## 2016-08-11 MED ORDER — ONDANSETRON HCL 4 MG PO TABS: 4.0000 mg | ORAL_TABLET | Freq: Four times a day (QID) | ORAL | Status: DC | PRN

## 2016-08-11 MED ORDER — CARVEDILOL 6.25 MG PO TABS
6.2500 mg | ORAL_TABLET | Freq: Every day | ORAL | Status: DC
  Administered 2016-08-11 – 2016-08-14 (×4): 6.25 mg via ORAL
  Filled 2016-08-11 (×4): qty 1

## 2016-08-11 MED ORDER — HYDRALAZINE HCL 20 MG/ML IJ SOLN
10.0000 mg | INTRAMUSCULAR | Status: DC | PRN
  Administered 2016-08-11 – 2016-08-14 (×3): 10 mg via INTRAVENOUS
  Filled 2016-08-11 (×3): qty 1

## 2016-08-11 MED ORDER — SULFASALAZINE 500 MG PO TBEC
500.0000 mg | DELAYED_RELEASE_TABLET | Freq: Two times a day (BID) | ORAL | Status: DC
  Administered 2016-08-11 – 2016-08-14 (×7): 500 mg via ORAL
  Filled 2016-08-11 (×8): qty 1

## 2016-08-11 MED ORDER — ATORVASTATIN CALCIUM 20 MG PO TABS
20.0000 mg | ORAL_TABLET | Freq: Every day | ORAL | Status: DC
  Administered 2016-08-11 – 2016-08-14 (×4): 20 mg via ORAL
  Filled 2016-08-11 (×4): qty 1

## 2016-08-11 MED ORDER — HYDROXYCHLOROQUINE SULFATE 200 MG PO TABS
200.0000 mg | ORAL_TABLET | Freq: Every day | ORAL | Status: DC
  Administered 2016-08-11 – 2016-08-14 (×4): 200 mg via ORAL
  Filled 2016-08-11 (×4): qty 1

## 2016-08-11 NOTE — Care Management Note (Signed)
Case Management Note  Patient Details  Name: [name] MRN: [id number] Date of Birth: May 19, 1940  Subjective/Objective:                 Patient admitted from home for H/A, HTN, confusion, difficulty ambulating. R/O meningitis.    Action/Plan:  CM will continue to follow. PT eval pending.  Expected Discharge Date:                  Expected Discharge Plan:  Home/Self Care  In-House Referral:     Discharge planning Services  CM Consult  Post Acute Care Choice:    Choice offered to:     DME Arranged:    DME Agency:     HH Arranged:    HH Agency:     Status of Service:  In process, will continue to follow  If discussed at Long Length of Stay Meetings, dates discussed:    Additional Comments:  Carles Collet, RN 08/11/2016, 10:17 AM

## 2016-08-11 NOTE — Progress Notes (Signed)
Pt received from Ed per stretcher- oriented to room CHG and MRSA swab done

## 2016-08-11 NOTE — ED Notes (Signed)
Patient taken to IR for lumbar puncture

## 2016-08-11 NOTE — Progress Notes (Signed)
Pharmacy Antibiotic Note  [name] is a 76 y.o. female admitted on 08/10/2016 with concern for meningitis.  Pharmacy has been consulted for Rocephin dosing.  Plan: Rocephin 2g IV Q12H.  Weight: 123 lb 7.3 oz (56 kg)  Temp (24hrs), Avg:99 F (37.2 C), Min:98.2 F (36.8 C), Max:99.7 F (37.6 C)   Recent Labs Lab 08/10/16 1703 08/10/16 1720 08/10/16 2009  WBC 15.3*  --   --   CREATININE 2.05* 1.90*  --   LATICACIDVEN  --  2.52* 2.72*    Estimated Creatinine Clearance: 18.4 mL/min (A) (by C-G formula based on SCr of 1.9 mg/dL (H)).    No Known Allergies   Thank you for allowing pharmacy to be a part of this patient's care.  Wynona Neat, PharmD, BCPS  08/11/2016 2:41 AM

## 2016-08-11 NOTE — H&P (Signed)
History and Physical    [name] QQP:[id number] DOB: 03-Feb-1940 DOA: 08/10/2016  PCP: Glendale Chard, MD  Patient coming from: Home.  Chief Complaint: Headache and confusion.  HPI: [name] is a 76 y.o. female with history of hypertension and rheumatoid arthritis was brought to the ER after patient was found to be increasingly confused and complaining of headache. Patient has been having headache for the last 5 days. Patient also found to be confused with difficulty ambulating.   ED Course: In the ER patient was found to be tachycardic with elevated lactate levels and blood work showed leukocytosis. CT of the head was unremarkable. Blood cultures were obtained. UA and chest x-ray were unremarkable. Lumbar puncture was attempted by the ER physician but was unable to be successful. Fluoroscopic guided lumbar puncture has been ordered and results are pending. Patient was given labetalol IV for markedly elevated blood pressure. On my exam patient's blood pressure improved to 712 systolic and by the time I examined patient is alert awake and oriented to time place and person moves all extremities and states that headache is resolved. Patient states over the last 2-3 days patient also denied nausea vomiting and diarrhea multiple times he denies taking any recent antibiotics.  Review of Systems: As per HPI, rest all negative.   Past Medical History:  Diagnosis Date  . Acute kidney injury superimposed on chronic kidney disease ([place]) 02/06/2015  . Anxiety   . Depression   . DVT (deep venous thrombosis) (HCC) 1970s   LLE  . Heart murmur   . Hypercholesterolemia   . Hypertension   . Kidney stones   . Migraine    "a few/year" (07/16/2015)  . Multiple falls   . Pneumonia "several times"  . Rheumatoid arthritis(714.0)    "all over" (07/16/2015)  . Seizure [facility])    "last one was 1//2017" (07/16/2015)  . Syncope and collapse   . Vision abnormalities     Past Surgical History:    Procedure Laterality Date  . ABDOMINAL HYSTERECTOMY    . CARPAL TUNNEL RELEASE Bilateral   . CATARACT EXTRACTION W/ INTRAOCULAR LENS  IMPLANT, BILATERAL Bilateral   . NECK EXPLORATION  07/16/2015  . PARATHYROIDECTOMY Right 07/16/2015   superior  . PARATHYROIDECTOMY Right 07/16/2015   Procedure: RIGHT INFERIOR PARATHYROIDECTOMY;  Surgeon: Armandina Gemma, MD;  Location: [facility];  Service: General;  Laterality: Right;  . TOENAIL AVULSION Bilateral    great toe     reports that she has been smoking Cigarettes.  She has a 7.32 pack-year smoking history. She has never used smokeless tobacco. She reports that she does not drink alcohol or use drugs.  No Known Allergies  Family History  Problem Relation Age of Onset  . Cancer Father   . Prostate cancer Father   . Cancer Brother   . Heart disease Brother   . Miscarriages / [place] Brother   . Heart disease Mother   . Pneumonia Mother     Prior to Admission medications   Medication Sig Start Date End Date Taking? Authorizing Provider  aspirin 81 MG tablet Take 81 mg by mouth daily.     Yes [provider]  aspirin-acetaminophen-caffeine (EXCEDRIN MIGRAINE) [phone] MG tablet Take 2 tablets by mouth every 6 (six) hours as needed for headache or migraine.   Yes [provider]  atorvastatin (LIPITOR) 20 MG tablet Take 20 mg by mouth daily. Reported on 03/24/2015   Yes [provider]  carvedilol (COREG) 6.25  MG tablet Take 6.25 mg by mouth daily.  08/02/15  Yes [provider]  hydroxychloroquine (PLAQUENIL) 200 MG tablet Take 200 mg by mouth daily.  08/02/15  Yes [provider]  levETIRAcetam (KEPPRA) 500 MG tablet Take 2 tablets (1,000 mg total) by mouth daily. 08/03/15  Yes Sater, Nanine Means, MD  sulfaSALAzine (AZULFIDINE) 500 MG EC tablet Take 1 tablet (500 mg total) by mouth 2 (two) times daily. 04/24/16 08/10/16 Yes Panwala, Naitik, PA-C  Vitamin D, Ergocalciferol, (DRISDOL) 50000 units CAPS capsule  Take 1 capsule (50,000 Units total) by mouth 2 (two) times a week. Patient taking differently: Take 50,000 Units by mouth every 7 (seven) days.  04/27/16  Yes Panwala, Naitik, PA-C  acetaminophen (TYLENOL) 325 MG tablet Take 2 tablets (650 mg total) by mouth every 6 (six) hours as needed for mild pain (or Fever >/= 101). Patient not taking: Reported on 04/24/2016 01/14/14   Delfina Redwood, MD  HYDROcodone-acetaminophen (NORCO/VICODIN) 5-325 MG tablet Take 1-2 tablets by mouth every 4 (four) hours as needed for moderate pain. Patient not taking: Reported on 12/20/2015 07/17/15   Armandina Gemma, MD  Vitamin D, Ergocalciferol, (DRISDOL) 50000 units CAPS capsule Take 50,000 Units by mouth once a week.  08/02/15   [provider]    Physical Exam: Vitals:   08/11/16 0115 08/11/16 0145 08/11/16 0215 08/11/16 0215  BP: (!) 162/94 (!) 155/98 (!) 166/93   Pulse: (!) 103 (!) 105 (!) 105   Resp: 15 16 17    Temp:    98.2 F (36.8 C)  TempSrc:      SpO2: 96% 93% 96%   Weight:          Constitutional: Moderately built and nourished. Vitals:   08/11/16 0115 08/11/16 0145 08/11/16 0215 08/11/16 0215  BP: (!) 162/94 (!) 155/98 (!) 166/93   Pulse: (!) 103 (!) 105 (!) 105   Resp: 15 16 17    Temp:    98.2 F (36.8 C)  TempSrc:      SpO2: 96% 93% 96%   Weight:       Eyes: Anicteric no pallor. ENMT: No discharge from the ears eyes nose and mouth. Neck: No neck rigidity no mass felt. Respiratory: No rhonchi or crepitations. Cardiovascular: S1 and S2 heard no murmurs appreciated. Abdomen: Soft nontender bowel sounds present. Musculoskeletal: No edema or joint effusion. Skin: No rash. Neurologic: Alert awake oriented to time place and person. Moves all extremities. Psychiatric: Appears normal. Normal affect.   Labs on Admission: I have personally reviewed following labs and imaging studies  CBC:  Recent Labs Lab 08/10/16 1703 08/10/16 1720  WBC 15.3*  --   NEUTROABS 12.4*  --     HGB 16.6* 18.0*  HCT 46.8* 53.0*  MCV 83.4  --   PLT 306  --    Basic Metabolic Panel:  Recent Labs Lab 08/10/16 1703 08/10/16 1720  NA 137 139  K 3.7 3.8  CL 103 105  CO2 20*  --   GLUCOSE 185* 190*  BUN 26* 31*  CREATININE 2.05* 1.90*  CALCIUM 9.4  --    GFR: Estimated Creatinine Clearance: 18.4 mL/min (A) (by C-G formula based on SCr of 1.9 mg/dL (H)). Liver Function Tests:  Recent Labs Lab 08/10/16 1703  AST 27  ALT 10*  ALKPHOS 107  BILITOT 0.9  PROT 8.7*  ALBUMIN 4.2   No results for input(s): LIPASE, AMYLASE in the last 168 hours. No results for input(s): AMMONIA in the last  168 hours. Coagulation Profile:  Recent Labs Lab 08/10/16 1703  INR 1.04   Cardiac Enzymes: No results for input(s): CKTOTAL, CKMB, CKMBINDEX, TROPONINI in the last 168 hours. BNP (last 3 results) No results for input(s): PROBNP in the last 8760 hours. HbA1C: No results for input(s): HGBA1C in the last 72 hours. CBG:  Recent Labs Lab 08/10/16 1702  GLUCAP 184*   Lipid Profile: No results for input(s): CHOL, HDL, LDLCALC, TRIG, CHOLHDL, LDLDIRECT in the last 72 hours. Thyroid Function Tests: No results for input(s): TSH, T4TOTAL, FREET4, T3FREE, THYROIDAB in the last 72 hours. Anemia Panel: No results for input(s): VITAMINB12, FOLATE, FERRITIN, TIBC, IRON, RETICCTPCT in the last 72 hours. Urine analysis:    Component Value Date/Time   COLORURINE YELLOW 08/10/2016 2101   APPEARANCEUR HAZY (A) 08/10/2016 2101   LABSPEC 1.006 08/10/2016 2101   PHURINE 6.0 08/10/2016 2101   GLUCOSEU NEGATIVE 08/10/2016 2101   HGBUR SMALL (A) 08/10/2016 2101   BILIRUBINUR NEGATIVE 08/10/2016 2101   KETONESUR NEGATIVE 08/10/2016 2101   PROTEINUR 100 (A) 08/10/2016 2101   UROBILINOGEN 1.0 01/12/2014 2237   NITRITE NEGATIVE 08/10/2016 2101   LEUKOCYTESUR NEGATIVE 08/10/2016 2101   Sepsis Labs: @LABRCNTIP (procalcitonin:4,lacticidven:4) )No results found for this or any previous visit  (from the past 240 hour(s)).   Radiological Exams on Admission: Dg Chest 2 View  Result Date: 08/10/2016 CLINICAL DATA:  admitted on 08/10/2016 with sepsis and to r/o meningitis. Pt has AMS and leukocytosis. EXAM: CHEST  2 VIEW COMPARISON:  02/06/2015 FINDINGS: Normal heart size and pulmonary vascularity. No focal airspace disease or consolidation in the lungs. No blunting of costophrenic angles. No pneumothorax. Mediastinal contours appear intact. Calcified and tortuous aorta. Degenerative changes in the spine. Surgical clips in the right upper quadrant. IMPRESSION: No active cardiopulmonary disease. Electronically Signed   By: Lucienne Capers M.D.   On: 08/10/2016 20:16   Ct Head Wo Contrast  Result Date: 08/10/2016 CLINICAL DATA:  Confusion and headaches EXAM: CT HEAD WITHOUT CONTRAST TECHNIQUE: Contiguous axial images were obtained from the base of the skull through the vertex without intravenous contrast. COMPARISON:  02/05/2015 FINDINGS: Brain: Mild atrophic changes and chronic white matter ischemic change is seen. 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 One lies within the right thalamus, head of the caudate nucleus on the left as well as within the internal capsule on left. No findings to suggest acute hemorrhage, acute infarction or space-occupying mass lesion are seen. Vascular: No hyperdense vessel or unexpected calcification. Skull: Normal. Negative for fracture or focal lesion. Sinuses/Orbits: No acute finding. Other: None. IMPRESSION: Chronic atrophic and ischemic changes. No acute abnormality is noted. Electronically Signed   By: Inez Catalina M.D.   On: 08/10/2016 18:06   Dg Fluoro Guided Loc Of Needle/cath Tip For Spinal Inject Lt  Result Date: 08/11/2016 CLINICAL DATA:  Altered mental status and headache, worsening over the past 5 days. EXAM: DIAGNOSTIC LUMBAR PUNCTURE UNDER FLUOROSCOPIC GUIDANCE FLUOROSCOPY TIME:  Fluoroscopy Time: Radiation Exposure Index (if provided by the fluoroscopic  device): 375.38 dose area product Number of Acquired Spot Images: 0 PROCEDURE: Informed consent was obtained from the patient prior to the procedure, including potential complications of headache, allergy, and pain. With the patient prone, the lower back was prepped with Betadine. 1% Lidocaine was used for local anesthesia. Lumbar puncture was performed at the L4 level using a 20 gauge needle with return of clear CSF. 8 ml of CSF were obtained for laboratory studies. The patient tolerated the procedure well and  there were no apparent complications. IMPRESSION: Fluoroscopic guided lumbar puncture Electronically Signed   By: Andreas Newport M.D.   On: 08/11/2016 01:27    EKG: Independently reviewed. Sinus tachycardia.  Assessment/Plan Principal Problem:   SIRS (systemic inflammatory response syndrome) (HCC) Active Problems:   Hypertensive urgency   Seizure (HCC)   Nausea vomiting and diarrhea   Renal failure (ARF), acute on chronic (HCC)    1. SIRS - follow cultures and lumbar puncture labs. For now I will continue vancomycin and ceftriaxone and since ER physician has sent RMSF and ehrlichiosis titers I have added doxycycline. Patient's symptoms could have been just from headache and dehydration from diarrhea. Follow lactate levels and procalcitonin levels. 2. Hypertensive urgency - blood pressure has improved with IV labetalol. On arrival it was more than 488 systolic. Patient is on Coreg. I have Patient on when necessary IV hydralazine for now. Closely follow blood pressure trends. 3. Acute encephalopathy likely from hypertensive encephalopathy which has improved on my exam of her blood pressure improved. Follow lumbar puncture labs. 4. Nausea vomiting and diarrhea - abdomen appears benign. Recheck lactate levels. Continue hydration. Check stool studies. 5. Headache - likely from hypertensive urgency. Headache is improved with blood pressure improving. Follow lumbar puncture labs. Unlikely to be  meningitis. 6. Acute on chronic renal failure - likely from dehydration continue hydration and follow metabolic panel. Check FENa. Urine does not show any casts. 7. Hyperglycemia - previous records mentioned diabetes mellitus but patient is not taking diabetic medications. Will check hemoglobin A1c. 8. History of rheumatoid arthritis on sulfasalazine and Plaquenil. Will need to hold immunosuppressants if definite evidence of infection.  I have reviewed patient's old charts and labs.   DVT prophylaxis: SCD since patient just had lumbar puncture. Code Status: Full code.  Family Communication: No family at the bedside.  Disposition Plan: Home.  Consults called: Physical therapy.  Admission status: Inpatient.    Rise Patience MD [facility] Pager [phone].  If 7PM-7AM, please contact night-coverage [email or web address] Password [email or web address]  08/11/2016, 2:25 AM

## 2016-08-11 NOTE — Progress Notes (Signed)
PROGRESS NOTE   [name]  PJK:[id number]    DOB: [birth year]    DOA: 08/10/2016  PCP: Glendale Chard, MD   I have briefly reviewed patients previous medical [facility] in [facility].  Brief Narrative:  76 year old female with a PMH of anxiety, depression, HLD, HTN, RA, lives with her spouse and great-granddaughter, presented with several days of headache, worsening confusion without sick contacts or insect bite history. No obvious source of infection on UA or chest x-ray. CT head negative. Lactate elevated at 2.5. Initial concern for meningitis. LP performed by IR. CSF not consistent with meningitis. Received IV labetalol for elevated blood pressure. Mental status significantly improved.   Assessment & Plan:   Principal Problem:   SIRS (systemic inflammatory response syndrome) (HCC) Active Problems:   Hypertensive urgency   Seizure (HCC)   Nausea vomiting and diarrhea   Renal failure (ARF), acute on chronic (HCC)   1. SIRS: Unclear etiology. Urine microscopy showed many bacteria but no pus cells. Blood cultures 2 negative. Chest x-ray negative. Cryptococcal antigen negative.CSF: Too few to count white blood cells. 2 red blood cells >not consistent with bacterial meningitis. Had been empirically started on IV vancomycin, ceftriaxone and doxycycline. Stop ceftriaxone and vancomycin. Ehrlichia and RMSF serology pending. Continue doxycycline for now. Clinically improved. 2. Hypertensive urgency: Arrived with SBP of 200s.? Compliance over the last few days. Improved after IV labetalol in ED with improvement in mental status. Continue carvedilol. Controlled now. 3. Acute encephalopathy: Possibly from hypertensive encephalopathy and less likely due to infectious etiology. Mental status probably back to baseline. Low index of suspicion for seizure given clinical improvement with current treatment. 4. Nausea, vomiting and diarrhea: Improved. Diet as tolerated and monitor. 5. Headache:  Likely from hypertensive urgency. Reports resolution in ED. Continue to monitor. 6. Acute on chronic kidney disease: May be related to dehydration and uncontrolled hypertension. Brief IV fluids and follow BMP. 7. Rheumatoid arthritis: Continue sulfasalazine and Plaquenil. 8. Seizure disorder: Continue Keppra. 9. Hyperglycemia: Monitor. 10. Hypokalemia: Replace and follow.   DVT prophylaxis: SCD Code Status: Full Family Communication: None at bedside Disposition: DC home when medically improved   Consultants:  Interventional radiology for lumbar puncture   Procedures:  Lumbar puncture under fluoroscopy by IR  Antimicrobials:  IV vancomycin-discontinue IV ceftriaxone-discontinue Doxycycline    Subjective: Seen this morning in the ED. States that she was feeling much better. Oriented 3. Denied headache, nausea, vomiting or diarrhea. No dysuria. No cough, chest pain or dyspnea. Denied insect bites. No sick contacts. Reports that her daughter gives her medications. Reports missing a few doses of her medications for unclear reasons.   ROS: As above  Objective:  Vitals:   08/11/16 1400 08/11/16 1500 08/11/16 1547 08/11/16 1600  BP: 124/70 131/65 (!) 111/56 (!) 110/57  Pulse: 88 80 96 92  Resp: 14 17 15 15   Temp:   98.5 F (36.9 C)   TempSrc:   Axillary   SpO2: 99% 98% 100% 97%  Weight:      Height:        Examination:  General exam: Pleasant elderly female lying comfortably supine in the gurney in the ED. Respiratory system: Clear to auscultation. Respiratory effort normal. Cardiovascular system: S1 & S2 heard, RRR. No JVD, murmurs, rubs, gallops or clicks. No pedal edema. Telemetry: Sinus rhythm. Gastrointestinal system: Abdomen is nondistended, soft and nontender. No organomegaly or masses felt. Normal bowel sounds heard. Central nervous system: Alert and oriented. No focal neurological deficits. No neck  stiffness. Extremities: Symmetric 5 x 5 power. Skin: No rashes,  lesions or ulcers Psychiatry: Judgement and insight appear normal. Mood & affect appropriate.     Data Reviewed: I have personally reviewed following labs and imaging studies  CBC:  Recent Labs Lab 08/10/16 1703 08/10/16 1720 08/11/16 0426  WBC 15.3*  --  12.8*  NEUTROABS 12.4*  --  9.0*  HGB 16.6* 18.0* 14.4  HCT 46.8* 53.0* 41.3  MCV 83.4  --  83.9  PLT 306  --  387   Basic Metabolic Panel:  Recent Labs Lab 08/10/16 1703 08/10/16 1720 08/11/16 0426  NA 137 139 139  K 3.7 3.8 3.3*  CL 103 105 107  CO2 20*  --  22  GLUCOSE 185* 190* 117*  BUN 26* 31* 20  CREATININE 2.05* 1.90* 1.75*  CALCIUM 9.4  --  8.4*   Liver Function Tests:  Recent Labs Lab 08/10/16 1703 08/11/16 0426  AST 27 23  ALT 10* 10*  ALKPHOS 107 85  BILITOT 0.9 0.7  PROT 8.7* 7.2  ALBUMIN 4.2 3.5   Coagulation Profile:  Recent Labs Lab 08/10/16 1703  INR 1.04   Cardiac Enzymes: No results for input(s): CKTOTAL, CKMB, CKMBINDEX, TROPONINI in the last 168 hours. HbA1C: No results for input(s): HGBA1C in the last 72 hours. CBG:  Recent Labs Lab 08/10/16 1702  GLUCAP 184*    Recent Results (from the past 240 hour(s))  Blood Culture (routine x 2)     Status: None (Preliminary result)   Collection Time: 08/10/16  7:48 PM  Result Value Ref Range Status   Specimen Description BLOOD RIGHT ANTECUBITAL  Final   Special Requests   Final    BOTTLES DRAWN AEROBIC AND ANAEROBIC Blood Culture adequate volume   Culture NO GROWTH < 24 HOURS  Final   Report Status PENDING  Incomplete  Blood Culture (routine x 2)     Status: None (Preliminary result)   Collection Time: 08/10/16  8:03 PM  Result Value Ref Range Status   Specimen Description BLOOD RIGHT WRIST  Final   Special Requests   Final    BOTTLES DRAWN AEROBIC ONLY Blood Culture adequate volume   Culture NO GROWTH < 24 HOURS  Final   Report Status PENDING  Incomplete  CSF culture     Status: None (Preliminary result)   Collection  Time: 08/11/16  1:01 AM  Result Value Ref Range Status   Specimen Description CSF  Final   Special Requests NONE  Final   Gram Stain   Final    CYTOSPIN SMEAR WBC PRESENT, PREDOMINANTLY MONONUCLEAR NO ORGANISMS SEEN    Culture PENDING  Incomplete   Report Status PENDING  Incomplete  MRSA PCR Screening     Status: None   Collection Time: 08/11/16  9:07 AM  Result Value Ref Range Status   MRSA by PCR NEGATIVE NEGATIVE Final    Comment:        The GeneXpert MRSA Assay (FDA approved for NASAL specimens only), is one component of a comprehensive MRSA colonization surveillance program. It is not intended to diagnose MRSA infection nor to guide or monitor treatment for MRSA infections.          Radiology Studies: Dg Chest 2 View  Result Date: 08/10/2016 CLINICAL DATA:  admitted on 08/10/2016 with sepsis and to r/o meningitis. Pt has AMS and leukocytosis. EXAM: CHEST  2 VIEW COMPARISON:  02/06/2015 FINDINGS: Normal heart size and pulmonary vascularity. No focal airspace disease or  consolidation in the lungs. No blunting of costophrenic angles. No pneumothorax. Mediastinal contours appear intact. Calcified and tortuous aorta. Degenerative changes in the spine. Surgical clips in the right upper quadrant. IMPRESSION: No active cardiopulmonary disease. Electronically Signed   By: Lucienne Capers M.D.   On: 08/10/2016 20:16   Ct Head Wo Contrast  Result Date: 08/10/2016 CLINICAL DATA:  Confusion and headaches EXAM: CT HEAD WITHOUT CONTRAST TECHNIQUE: Contiguous axial images were obtained from the base of the skull through the vertex without intravenous contrast. COMPARISON:  02/05/2015 FINDINGS: Brain: Mild atrophic changes and chronic white matter ischemic change is seen. 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 One lies within the right thalamus, head of the caudate nucleus on the left as well as within the internal capsule on left. No findings to suggest acute hemorrhage, acute infarction or  space-occupying mass lesion are seen. Vascular: No hyperdense vessel or unexpected calcification. Skull: Normal. Negative for fracture or focal lesion. Sinuses/Orbits: No acute finding. Other: None. IMPRESSION: Chronic atrophic and ischemic changes. No acute abnormality is noted. Electronically Signed   By: Inez Catalina M.D.   On: 08/10/2016 18:06   Dg Fluoro Guided Loc Of Needle/cath Tip For Spinal Inject Lt  Result Date: 08/11/2016 CLINICAL DATA:  Altered mental status and headache, worsening over the past 5 days. EXAM: DIAGNOSTIC LUMBAR PUNCTURE UNDER FLUOROSCOPIC GUIDANCE FLUOROSCOPY TIME:  Fluoroscopy Time: Radiation Exposure Index (if provided by the fluoroscopic device): 375.38 dose area product Number of Acquired Spot Images: 0 PROCEDURE: Informed consent was obtained from the patient prior to the procedure, including potential complications of headache, allergy, and pain. With the patient prone, the lower back was prepped with Betadine. 1% Lidocaine was used for local anesthesia. Lumbar puncture was performed at the L4 level using a 20 gauge needle with return of clear CSF. 8 ml of CSF were obtained for laboratory studies. The patient tolerated the procedure well and there were no apparent complications. IMPRESSION: Fluoroscopic guided lumbar puncture Electronically Signed   By: Andreas Newport M.D.   On: 08/11/2016 01:27        Scheduled Meds: . atorvastatin  20 mg Oral Daily  . carvedilol  6.25 mg Oral Daily  . hydroxychloroquine  200 mg Oral Daily  . levETIRAcetam  1,000 mg Oral Daily  . sulfaSALAzine  500 mg Oral BID   Continuous Infusions: . sodium chloride 125 mL/hr at 08/11/16 1743  . cefTRIAXone (ROCEPHIN)  IV Stopped (08/11/16 1040)  . doxycycline (VIBRAMYCIN) IV 100 mg (08/11/16 1700)  . vancomycin       LOS: 0 days     Aragon Scarantino, MD, FACP, FHM. [facility] Pager [phone]  If 7PM-7AM, please contact night-coverage [email or web address] Password  [facility] 08/11/2016, 7:12 PM

## 2016-08-12 DIAGNOSIS — R197 Diarrhea, unspecified: Secondary | ICD-10-CM

## 2016-08-12 DIAGNOSIS — N179 Acute kidney failure, unspecified: Secondary | ICD-10-CM

## 2016-08-12 DIAGNOSIS — E876 Hypokalemia: Secondary | ICD-10-CM

## 2016-08-12 DIAGNOSIS — R112 Nausea with vomiting, unspecified: Secondary | ICD-10-CM

## 2016-08-12 DIAGNOSIS — G9340 Encephalopathy, unspecified: Secondary | ICD-10-CM

## 2016-08-12 DIAGNOSIS — I160 Hypertensive urgency: Secondary | ICD-10-CM

## 2016-08-12 LAB — BASIC METABOLIC PANEL
Anion gap: 9 (ref 5–15)
BUN: 21 mg/dL — AB (ref 6–20)
CHLORIDE: 108 mmol/L (ref 101–111)
CO2: 21 mmol/L — AB (ref 22–32)
CREATININE: 1.46 mg/dL — AB (ref 0.44–1.00)
Calcium: 8.2 mg/dL — ABNORMAL LOW (ref 8.9–10.3)
GFR calc Af Amer: 39 mL/min — ABNORMAL LOW (ref >60)
GFR calc non Af Amer: 33 mL/min — ABNORMAL LOW (ref >60)
GLUCOSE: 86 mg/dL (ref 65–99)
Potassium: 3.4 mmol/L — ABNORMAL LOW (ref 3.5–5.1)
Sodium: 138 mmol/L (ref 135–145)

## 2016-08-12 LAB — HEMOGLOBIN A1C
Hgb A1c MFr Bld: 5.6 % (ref 4.8–5.6)
Mean Plasma Glucose: 114 mg/dL

## 2016-08-12 LAB — CBC
HCT: 37.9 % (ref 36.0–46.0)
Hemoglobin: 12.9 g/dL (ref 12.0–15.0)
MCH: 28.5 pg (ref 26.0–34.0)
MCHC: 34 g/dL (ref 30.0–36.0)
MCV: 83.7 fL (ref 78.0–100.0)
PLATELETS: 236 10*3/uL (ref 150–400)
RBC: 4.53 MIL/uL (ref 3.87–5.11)
RDW: 14 % (ref 11.5–15.5)
WBC: 11.2 10*3/uL — ABNORMAL HIGH (ref 4.0–10.5)

## 2016-08-12 LAB — MAGNESIUM: Magnesium: 1.4 mg/dL — ABNORMAL LOW (ref 1.7–2.4)

## 2016-08-12 LAB — ROCKY MTN SPOTTED FVR ABS PNL(IGG+IGM)
RMSF IgG: NEGATIVE
RMSF IgM: 0.87 index (ref 0.00–0.89)

## 2016-08-12 MED ORDER — POTASSIUM CHLORIDE CRYS ER 20 MEQ PO TBCR
40.0000 meq | EXTENDED_RELEASE_TABLET | Freq: Once | ORAL | Status: AC
  Administered 2016-08-12: 40 meq via ORAL
  Filled 2016-08-12: qty 2

## 2016-08-12 MED ORDER — SODIUM CHLORIDE 0.9 % IV SOLN: INTRAVENOUS | Status: DC

## 2016-08-12 MED ORDER — ASPIRIN EC 81 MG PO TBEC
81.0000 mg | DELAYED_RELEASE_TABLET | Freq: Every day | ORAL | Status: DC
  Administered 2016-08-12 – 2016-08-14 (×3): 81 mg via ORAL
  Filled 2016-08-12 (×3): qty 1

## 2016-08-12 NOTE — Evaluation (Signed)
Physical Therapy Evaluation Patient Details Name: [name] MRN: [id number] DOB: 01-01-1941 Today's Date: 08/12/2016   History of Present Illness  76 year old female with a PMH of anxiety, depression, HLD, HTN, RA, lives with her spouse and great-granddaughter, presented with several days of headache, worsening confusion, and HTN.  Clinical Impression  Patient presents with problems listed below.  Will benefit from acute PT to maximize functional mobility prior to discharge.  Patient is at home alone most of the day - husband works.  Patient with general weakness impacting mobility, gait, activity tolerance, balance, and safety.  Recommend ST-SNF at d/c for continued therapy prior to returning home.    Follow Up Recommendations SNF;Supervision/Assistance - 24 hour    Equipment Recommendations  Wheelchair (measurements PT);Wheelchair cushion (measurements PT)    Recommendations for Other Services       Precautions / Restrictions Precautions Precautions: Fall Precaution Comments: h/o falls Restrictions Weight Bearing Restrictions: No      Mobility  Bed Mobility Overal bed mobility: Needs Assistance Bed Mobility: Supine to Sit;Sit to Supine     Supine to sit: Min assist Sit to supine: Min assist   General bed mobility comments: Assist to bring hips to EOB once upright.  Assist to bring LE's onto bed to return to supine.  Transfers Overall transfer level: Needs assistance Equipment used: Rolling walker (2 wheeled) Transfers: Sit to/from Stand Sit to Stand: Min assist         General transfer comment: Verbal cues for hand placement.  Assist to rise to standing and for balance.    Ambulation/Gait Ambulation/Gait assistance: Min assist Ambulation Distance (Feet): 48 Feet Assistive device: Rolling walker (2 wheeled) Gait Pattern/deviations: Step-through pattern;Decreased step length - right;Decreased step length - left;Decreased stride length;Shuffle;Trunk  flexed Gait velocity: decreased Gait velocity interpretation: Below normal speed for age/gender General Gait Details: Patient with very slow, unsteady gait.  Cues to stand upright.  Assist for balance and maneuvering RW during turns.  Stairs            Wheelchair Mobility    Modified Rankin (Stroke Patients Only)       Balance Overall balance assessment: Needs assistance;History of Falls Sitting-balance support: No upper extremity supported;Feet supported Sitting balance-Leahy Scale: Fair     Standing balance support: Bilateral upper extremity supported Standing balance-Leahy Scale: Poor                               Pertinent Vitals/Pain Pain Assessment: No/denies pain    Home Living Family/patient expects to be discharged to:: Private residence Living Arrangements: Spouse/significant other (occasionally great-granddaughter stays with patient) Available Help at Discharge: Family;Available PRN/intermittently Type of Home: House Home Access: Stairs to enter Entrance Stairs-Rails: Right Entrance Stairs-Number of Steps: 3 Home Layout: One level Home Equipment: Walker - 2 wheels;Cane - single point;Bedside commode      Prior Function Level of Independence: Independent with assistive device(s);Needs assistance   Gait / Transfers Assistance Needed: Patient reports she walks with a cane.  However, she stays in the bed when she is home alone - fear of falling.  ADL's / Homemaking Assistance Needed: Husband prepares meals, does housekeeping and laundry.  Granddaughter assists with bathing.        Hand Dominance        Extremity/Trunk Assessment   Upper Extremity Assessment Upper Extremity Assessment: Generalized weakness    Lower Extremity Assessment Lower Extremity Assessment: Generalized weakness  Cervical / Trunk Assessment Cervical / Trunk Assessment: Kyphotic  Communication   Communication: No difficulties  Cognition Arousal/Alertness:  Awake/alert Behavior During Therapy: WFL for tasks assessed/performed Overall Cognitive Status: No family/caregiver present to determine baseline cognitive functioning Area of Impairment: Orientation;Problem solving                 Orientation Level: Disoriented to;Time           Problem Solving: Slow processing;Decreased initiation;Requires verbal cues        General Comments      Exercises     Assessment/Plan    PT Assessment Patient needs continued PT services  PT Problem List Decreased strength;Decreased activity tolerance;Decreased balance;Decreased mobility;Decreased cognition;Decreased knowledge of use of DME       PT Treatment Interventions DME instruction;Gait training;Functional mobility training;Therapeutic activities;Therapeutic exercise;Cognitive remediation;Patient/family education    PT Goals (Current goals can be found in the Care Plan section)  Acute Rehab PT Goals Patient Stated Goal: To get stronger PT Goal Formulation: With patient Time For Goal Achievement: 08/26/16 Potential to Achieve Goals: Good    Frequency Min 3X/week   Barriers to discharge Decreased caregiver support PRN assist from family.  Is alone most of the day.    Co-evaluation               AM-PAC PT "6 Clicks" Daily Activity  Outcome Measure Difficulty turning over in bed (including adjusting bedclothes, sheets and blankets)?: None Difficulty moving from lying on back to sitting on the side of the bed? : Total Difficulty sitting down on and standing up from a chair with arms (e.g., wheelchair, bedside commode, etc,.)?: Total Help needed moving to and from a bed to chair (including a wheelchair)?: A Little Help needed walking in hospital room?: A Little Help needed climbing 3-5 steps with a railing? : A Lot 6 Click Score: 14    End of Session Equipment Utilized During Treatment: Gait belt Activity Tolerance: Patient limited by fatigue Patient left: in bed;with  call bell/phone within reach;with family/visitor present;with SCD's reapplied Nurse Communication: Mobility status PT Visit Diagnosis: Unsteadiness on feet (R26.81);Muscle weakness (generalized) (M62.81);History of falling (Z91.81)    Time: 1401-1430 PT Time Calculation (min) (ACUTE ONLY): 29 min   Charges:   PT Evaluation $PT Eval Moderate Complexity: 1 Procedure PT Treatments $Gait Training: 8-22 mins   PT G Codes:        Carita Pian. Sanjuana Kava, [facility] Acute Rehab Services Pager [place] 08/12/2016, 4:17 PM

## 2016-08-12 NOTE — Progress Notes (Signed)
PROGRESS NOTE   [name]  XLK:[id number]    DOB: 1940-03-27    DOA: 08/10/2016  PCP: Glendale Chard, MD   I have briefly reviewed patients previous medical records in [facility].  Brief Narrative:  76 year old female with a PMH of anxiety, depression, HLD, HTN, RA, lives with her spouse and great-granddaughter, presented with several days of headache, worsening confusion without sick contacts or insect bite history. No obvious source of infection on UA or chest x-ray. CT head negative. Lactate elevated at 2.5. Initial concern for meningitis. LP performed by IR. CSF not consistent with meningitis. Received IV labetalol for elevated blood pressure. Mental status significantly improvedAnd probably back to baseline. Low index of infectious etiology and hence all antimicrobials discontinued. Acute kidney injury improving. Transferred from step down to medical bed 7/28. Possible discharge home 7/29 ending PT eval.   Assessment & Plan:   Principal Problem:   SIRS (systemic inflammatory response syndrome) (HCC) Active Problems:   Hypertensive urgency   Seizure (HCC)   Nausea vomiting and diarrhea   Renal failure (ARF), acute on chronic (HCC)   1. SIRS: Unclear etiology. Urine microscopy showed many bacteria but no pus cells. Blood cultures 2 negative to date. Chest x-ray negative. Cryptococcal antigen negative.CSF: Too few to count white blood cells. 2 red blood cells >not consistent with bacterial meningitis. Had been empirically started on IV vancomycin, ceftriaxone and doxycycline. Stop ceftriaxone and vancomycin. Ehrlichia serology pending. RMSF IgM and IgG negative. Discussed with ID M.D. on call and agree low index of suspicion for infectious etiology and hence discontinued doxycycline. Monitor. 2. Hypertensive urgency: Arrived with SBP of 200s.? Compliance over the last few days. Improved after IV labetalol in ED with improvement in mental status. Carvedilol was increased from  home dose of 6.25 MG daily to 6.25 MG twice daily. Controlled now. 3. Acute encephalopathy: Possibly from hypertensive encephalopathy and less likely due to infectious etiology. Mental status probably back to baseline. Low index of suspicion for seizure given clinical improvement with current treatment. 4. Nausea, vomiting and diarrhea: Resolved. DC contact isolation. Tolerating diet. 5. Headache: Patient reports chronic headaches. May have been made worse on admission due to hypertensive urgency. Intermittent mild chronic headache. None at this time. 6. 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 Presented with creatinine of 2.05. May be related to dehydration and uncontrolled hypertension. Brief IV fluids and follow BMP. Creatinine continues to improve/1.4. Continue additional day of IV fluids and follow BMP in a.m. 7. Rheumatoid arthritis: Continue sulfasalazine and Plaquenil. Stable without flare. 8. Seizure disorder: Continue Keppra. Reports last seizure was approximately 2 years ago. 9. Hyperglycemia without DM diagnosis: A1c: 5.6. 10. Hypokalemia: Replace and follow. Check magnesium.   DVT prophylaxis: SCD Code Status: Full Family Communication: None at bedside. Unable to reach spouse via phone. Left voice mail to call back. Disposition: Admitted to stepdown unit. Transferred to medical bed on 7/28. Possible discharge home 7/29.   Consultants:  Interventional radiology for lumbar puncture   Procedures:  Lumbar puncture under fluoroscopy by IR  Antimicrobials:  IV vancomycin-discontinue IV ceftriaxone-discontinue Doxycycline -discontinued   Subjective: Minimal headache this morning which has resolved. Reports several years of chronic headache. Also states that she had migraines as a teenager. Denies insect bites. No sick contacts with nausea, vomiting or diarrhea. No nausea, vomiting or diarrhea in the hospital. Tolerating diet. Denies dysuria  or urinary frequency. As per RN, no acute issues.  ROS: Denies  neck pain. Last seizure 2 years ago.  Objective:  Vitals:   08/11/16 2253 08/12/16 0352 08/12/16 0419 08/12/16 0709  BP: 132/72  (!) 161/82 131/82  Pulse: 99  95 (!) 104  Resp: 16  18 18   Temp: 98.6 F (37 C)  99.1 F (37.3 C) 98.3 F (36.8 C)  TempSrc: Oral  Oral Oral  SpO2: 96%  98% 98%  Weight:  58.2 kg (128 lb 4.9 oz)    Height:        Examination:  General exam: Pleasant elderly female lying comfortably propped up in bed. Does not look septic or toxic. Respiratory system: Clear to auscultation. Respiratory effort normal. Stable. Cardiovascular system: S1 & S2 heard, RRR. No JVD, murmurs, rubs, gallops or clicks. No pedal edema. Telemetry: Sinus rhythm. Occasional PVCs. Gastrointestinal system: Abdomen is nondistended, soft and nontender. No organomegaly or masses felt. Normal bowel sounds heard. Stable without change. Central nervous system: Alert and oriented. No focal neurological deficits. No neck stiffness. Stable without change Extremities: Symmetric 5 x 5 power. Skin: No rashes, lesions or ulcers Psychiatry: Judgement and insight appear normal. Mood & affect appropriate.     Data Reviewed: I have personally reviewed following labs and imaging studies  CBC:  Recent Labs Lab 08/10/16 1703 08/10/16 1720 08/11/16 0426 08/12/16 0231  WBC 15.3*  --  12.8* 11.2*  NEUTROABS 12.4*  --  9.0*  --   HGB 16.6* 18.0* 14.4 12.9  HCT 46.8* 53.0* 41.3 37.9  MCV 83.4  --  83.9 83.7  PLT 306  --  260 865   Basic Metabolic Panel:  Recent Labs Lab 08/10/16 1703 08/10/16 1720 08/11/16 0426 08/12/16 0231  NA 137 139 139 138  K 3.7 3.8 3.3* 3.4*  CL 103 105 107 108  CO2 20*  --  22 21*  GLUCOSE 185* 190* 117* 86  BUN 26* 31* 20 21*  CREATININE 2.05* 1.90* 1.75* 1.46*  CALCIUM 9.4  --  8.4* 8.2*   Liver Function Tests:  Recent Labs Lab 08/10/16 1703 08/11/16 0426  AST 27 23  ALT 10* 10*    ALKPHOS 107 85  BILITOT 0.9 0.7  PROT 8.7* 7.2  ALBUMIN 4.2 3.5   Coagulation Profile:  Recent Labs Lab 08/10/16 1703  INR 1.04   Cardiac Enzymes: No results for input(s): CKTOTAL, CKMB, CKMBINDEX, TROPONINI in the last 168 hours. HbA1C:  Recent Labs  08/11/16 0914  HGBA1C 5.6   CBG:  Recent Labs Lab 08/10/16 1702  GLUCAP 184*    Recent Results (from the past 240 hour(s))  Blood Culture (routine x 2)     Status: None (Preliminary result)   Collection Time: 08/10/16  7:48 PM  Result Value Ref Range Status   Specimen Description BLOOD RIGHT ANTECUBITAL  Final   Special Requests   Final    BOTTLES DRAWN AEROBIC AND ANAEROBIC Blood Culture adequate volume   Culture NO GROWTH < 24 HOURS  Final   Report Status PENDING  Incomplete  Blood Culture (routine x 2)     Status: None (Preliminary result)   Collection Time: 08/10/16  8:03 PM  Result Value Ref Range Status   Specimen Description BLOOD RIGHT WRIST  Final   Special Requests   Final    BOTTLES DRAWN AEROBIC ONLY Blood Culture adequate volume   Culture NO GROWTH < 24 HOURS  Final   Report Status PENDING  Incomplete  CSF culture     Status: None (Preliminary result)   Collection  Time: 08/11/16  1:01 AM  Result Value Ref Range Status   Specimen Description CSF  Final   Special Requests NONE  Final   Gram Stain   Final    CYTOSPIN SMEAR WBC PRESENT, PREDOMINANTLY MONONUCLEAR NO ORGANISMS SEEN    Culture NO GROWTH 1 DAY  Final   Report Status PENDING  Incomplete  MRSA PCR Screening     Status: None   Collection Time: 08/11/16  9:07 AM  Result Value Ref Range Status   MRSA by PCR NEGATIVE NEGATIVE Final    Comment:        The GeneXpert MRSA Assay (FDA approved for NASAL specimens only), is one component of a comprehensive MRSA colonization surveillance program. It is not intended to diagnose MRSA infection nor to guide or monitor treatment for MRSA infections.          Radiology Studies: Dg  Chest 2 View  Result Date: 08/10/2016 CLINICAL DATA:  admitted on 08/10/2016 with sepsis and to r/o meningitis. Pt has AMS and leukocytosis. EXAM: CHEST  2 VIEW COMPARISON:  02/06/2015 FINDINGS: Normal heart size and pulmonary vascularity. No focal airspace disease or consolidation in the lungs. No blunting of costophrenic angles. No pneumothorax. Mediastinal contours appear intact. Calcified and tortuous aorta. Degenerative changes in the spine. Surgical clips in the right upper quadrant. IMPRESSION: No active cardiopulmonary disease. Electronically Signed   By: Lucienne Capers M.D.   On: 08/10/2016 20:16   Ct Head Wo Contrast  Result Date: 08/10/2016 CLINICAL DATA:  Confusion and headaches EXAM: CT HEAD WITHOUT CONTRAST TECHNIQUE: Contiguous axial images were obtained from the base of the skull through the vertex without intravenous contrast. COMPARISON:  02/05/2015 FINDINGS: Brain: Mild atrophic changes and chronic white matter ischemic change is seen. 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 One lies within the right thalamus, head of the caudate nucleus on the left as well as within the internal capsule on left. No findings to suggest acute hemorrhage, acute infarction or space-occupying mass lesion are seen. Vascular: No hyperdense vessel or unexpected calcification. Skull: Normal. Negative for fracture or focal lesion. Sinuses/Orbits: No acute finding. Other: None. IMPRESSION: Chronic atrophic and ischemic changes. No acute abnormality is noted. Electronically Signed   By: Inez Catalina M.D.   On: 08/10/2016 18:06   Dg Fluoro Guided Loc Of Needle/cath Tip For Spinal Inject Lt  Result Date: 08/11/2016 CLINICAL DATA:  Altered mental status and headache, worsening over the past 5 days. EXAM: DIAGNOSTIC LUMBAR PUNCTURE UNDER FLUOROSCOPIC GUIDANCE FLUOROSCOPY TIME:  Fluoroscopy Time: Radiation Exposure Index (if provided by the fluoroscopic device): 375.38 dose area product Number of Acquired Spot Images: 0  PROCEDURE: Informed consent was obtained from the patient prior to the procedure, including potential complications of headache, allergy, and pain. With the patient prone, the lower back was prepped with Betadine. 1% Lidocaine was used for local anesthesia. Lumbar puncture was performed at the L4 level using a 20 gauge needle with return of clear CSF. 8 ml of CSF were obtained for laboratory studies. The patient tolerated the procedure well and there were no apparent complications. IMPRESSION: Fluoroscopic guided lumbar puncture Electronically Signed   By: Andreas Newport M.D.   On: 08/11/2016 01:27        Scheduled Meds: . atorvastatin  20 mg Oral Daily  . carvedilol  6.25 mg Oral Daily  . hydroxychloroquine  200 mg Oral Daily  . levETIRAcetam  1,000 mg Oral Daily  . sulfaSALAzine  500 mg Oral BID  Continuous Infusions: . sodium chloride 50 mL/hr at 08/12/16 0844     LOS: 1 day     HONGALGI,ANAND, MD, FACP, FHM. [facility] Pager [phone]  If 7PM-7AM, please contact night-coverage [email or web address] Password [email or web address] 08/12/2016, 10:40 AM

## 2016-08-13 LAB — BASIC METABOLIC PANEL
Anion gap: 6 (ref 5–15)
BUN: 20 mg/dL (ref 6–20)
CO2: 21 mmol/L — AB (ref 22–32)
Calcium: 8.4 mg/dL — ABNORMAL LOW (ref 8.9–10.3)
Chloride: 115 mmol/L — ABNORMAL HIGH (ref 101–111)
Creatinine, Ser: 1.52 mg/dL — ABNORMAL HIGH (ref 0.44–1.00)
GFR calc Af Amer: 37 mL/min — ABNORMAL LOW (ref >60)
GFR, EST NON AFRICAN AMERICAN: 32 mL/min — AB (ref >60)
GLUCOSE: 83 mg/dL (ref 65–99)
POTASSIUM: 3.7 mmol/L (ref 3.5–5.1)
Sodium: 142 mmol/L (ref 135–145)

## 2016-08-13 MED ORDER — MAGNESIUM SULFATE 2 GM/50ML IV SOLN
2.0000 g | Freq: Once | INTRAVENOUS | Status: AC
  Administered 2016-08-13: 2 g via INTRAVENOUS
  Filled 2016-08-13: qty 50

## 2016-08-13 NOTE — Progress Notes (Signed)
PROGRESS NOTE   [name]  BJS:[id number]    DOB: 1940/06/28    DOA: 08/10/2016  PCP: Glendale Chard, MD   I have briefly reviewed patients previous medical records in [facility].  Brief Narrative:  76 year old female with a PMH of anxiety, depression, HLD, HTN, RA, lives with her spouse and great-granddaughter, presented with several days of headache, worsening confusion without sick contacts or insect bite history. No obvious source of infection on UA or chest x-ray. CT head negative. Lactate elevated at 2.5. Initial concern for meningitis. LP performed by IR. CSF not consistent with meningitis. Received IV labetalol for elevated blood pressure. Mental status significantly improvedAnd probably back to baseline. Low index of infectious etiology and hence all antimicrobials discontinued. Acute kidney injury improving. Transferred from step down to medical bed 7/28. PT recommends home with 24/7 supervision (spouse works and hence not available 24/7 and no one else at home). Hence social worker consulted for SNF. Patient agreeable.   Assessment & Plan:   Principal Problem:   SIRS (systemic inflammatory response syndrome) (HCC) Active Problems:   Hypertensive urgency   Seizure (HCC)   Nausea vomiting and diarrhea   Renal failure (ARF), acute on chronic (HCC)   1. SIRS: Unclear etiology. Urine microscopy showed many bacteria but no pus cells. Blood cultures 2 negative to date. Chest x-ray negative. Cryptococcal antigen negative.CSF: Too few to count white blood cells. 2 red blood cells >not consistent with bacterial meningitis. Had been empirically started on IV vancomycin, ceftriaxone and doxycycline. Stopped ceftriaxone and vancomycin. Ehrlichia serology pending. RMSF IgM and IgG negative. Discussed with ID M.D. on call 7/28 and agree low index of suspicion for infectious etiology and hence discontinued doxycycline. Stable. 2. Hypertensive urgency: Arrived with SBP of 200s.?  Compliance over the last few days. Improved after IV labetalol in ED with improvement in mental status. Carvedilol was increased from home dose of 6.25 MG daily to 6.25 MG twice daily. Controlled now. 3. Acute encephalopathy: Possibly from hypertensive encephalopathy and less likely due to infectious etiology. Low index of suspicion for seizure given clinical improvement with current treatment. Resolved. 4. Nausea, vomiting and diarrhea: Resolved. DC contact isolation. Tolerating diet. 5. Headache: Patient reports chronic headaches. May have been made worse on admission due to hypertensive urgency. Intermittent mild chronic headache. None at this time. 6. 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 Presented with creatinine of 2.05. May be related to dehydration and uncontrolled hypertension. Briefly hydrated with IV fluids. Creatinine has plateaued in the 1.4-1.5 range over the last 2 days. DC IV fluids. Follow BMP as outpatient. 7. Rheumatoid arthritis: Continue sulfasalazine and Plaquenil. Stable without flare. 8. Seizure disorder: Continue Keppra. Reports last seizure was approximately 2 years ago. 9. Hyperglycemia without DM diagnosis: A1c: 5.6. 10. Hypokalemia: Replaced.  11. Hypomagnesemia: Magnesium 1.4. Replace IV and follow.   DVT prophylaxis: SCD Code Status: Full Family Communication: None at bedside. Unable to reach spouse via phone. Left voice mail to call back. Disposition: Admitted to stepdown unit. Transferred to medical bed on 7/28. DC to SNF when bed available. Social worker consulted.   Consultants:  Interventional radiology for lumbar puncture   Procedures:  Lumbar puncture under fluoroscopy by IR  Antimicrobials:  IV vancomycin-discontinue IV ceftriaxone-discontinue Doxycycline -discontinued   Subjective: Seen this morning. Denies complaints. No headache or pain elsewhere reported. Patient states that her spouse works at [employer] (histocompatibility and immunogenetics)  over [place] from [place], 7-3 PM. States no one  else at home to assist. Agreeable to SNF for rehabilitation. As per RN, no acute issues.  ROS: Denies neck pain. Last seizure 2 years ago.  Objective:  Vitals:   08/12/16 1317 08/12/16 1504 08/12/16 2153 08/13/16 0530  BP:  136/73 131/71 (!) 149/85  Pulse:  74 88 90  Resp:  18 18 18   Temp: 98.7 F (37.1 C) 98.2 F (36.8 C) 99.2 F (37.3 C) 98.8 F (37.1 C)  TempSrc: Oral Oral Oral Oral  SpO2:  97% 97% 99%  Weight:      Height:        Examination:  General exam: Pleasant elderly female lying comfortably sitting up in bed eating breakfast this morning. Respiratory system: Clear to auscultation. Respiratory effort normal. Stable Cardiovascular system: S1 & S2 heard, RRR. No JVD, murmurs, rubs, gallops or clicks. No pedal edema. Stable Gastrointestinal system: Abdomen is nondistended, soft and nontender. No organomegaly or masses felt. Normal bowel sounds heard. Stable Central nervous system: Alert and oriented. No focal neurological deficits. No neck stiffness. Stable Extremities: Symmetric 5 x 5 power. Skin: No rashes, lesions or ulcers Psychiatry: Judgement and insight appear normal. Mood & affect appropriate.     Data Reviewed: I have personally reviewed following labs and imaging studies  CBC:  Recent Labs Lab 08/10/16 1703 08/10/16 1720 08/11/16 0426 08/12/16 0231  WBC 15.3*  --  12.8* 11.2*  NEUTROABS 12.4*  --  9.0*  --   HGB 16.6* 18.0* 14.4 12.9  HCT 46.8* 53.0* 41.3 37.9  MCV 83.4  --  83.9 83.7  PLT 306  --  260 160   Basic Metabolic Panel:  Recent Labs Lab 08/10/16 1703 08/10/16 1720 08/11/16 0426 08/12/16 0231 08/12/16 1123 08/13/16 0408  NA 137 139 139 138  --  142  K 3.7 3.8 3.3* 3.4*  --  3.7  CL 103 105 107 108  --  115*  CO2 20*  --  22 21*  --  21*  GLUCOSE 185* 190* 117* 86  --  83  BUN 26* 31* 20 21*  --  20  CREATININE 2.05* 1.90* 1.75* 1.46*  --  1.52*  CALCIUM 9.4  --   8.4* 8.2*  --  8.4*  MG  --   --   --   --  1.4*  --    Liver Function Tests:  Recent Labs Lab 08/10/16 1703 08/11/16 0426  AST 27 23  ALT 10* 10*  ALKPHOS 107 85  BILITOT 0.9 0.7  PROT 8.7* 7.2  ALBUMIN 4.2 3.5   Coagulation Profile:  Recent Labs Lab 08/10/16 1703  INR 1.04   Cardiac Enzymes: No results for input(s): CKTOTAL, CKMB, CKMBINDEX, TROPONINI in the last 168 hours. HbA1C:  Recent Labs  08/11/16 0914  HGBA1C 5.6   CBG:  Recent Labs Lab 08/10/16 1702  GLUCAP 184*    Recent Results (from the past 240 hour(s))  Blood Culture (routine x 2)     Status: None (Preliminary result)   Collection Time: 08/10/16  7:48 PM  Result Value Ref Range Status   Specimen Description BLOOD RIGHT ANTECUBITAL  Final   Special Requests   Final    BOTTLES DRAWN AEROBIC AND ANAEROBIC Blood Culture adequate volume   Culture NO GROWTH 2 DAYS  Final   Report Status PENDING  Incomplete  Blood Culture (routine x 2)     Status: None (Preliminary result)   Collection Time: 08/10/16  8:03 PM  Result Value Ref Range Status  Specimen Description BLOOD RIGHT WRIST  Final   Special Requests   Final    BOTTLES DRAWN AEROBIC ONLY Blood Culture adequate volume   Culture NO GROWTH 2 DAYS  Final   Report Status PENDING  Incomplete  CSF culture     Status: None (Preliminary result)   Collection Time: 08/11/16  1:01 AM  Result Value Ref Range Status   Specimen Description CSF  Final   Special Requests NONE  Final   Gram Stain   Final    CYTOSPIN SMEAR WBC PRESENT, PREDOMINANTLY MONONUCLEAR NO ORGANISMS SEEN    Culture NO GROWTH 1 DAY  Final   Report Status PENDING  Incomplete  MRSA PCR Screening     Status: None   Collection Time: 08/11/16  9:07 AM  Result Value Ref Range Status   MRSA by PCR NEGATIVE NEGATIVE Final    Comment:        The GeneXpert MRSA Assay (FDA approved for NASAL specimens only), is one component of a comprehensive MRSA colonization surveillance program.  It is not intended to diagnose MRSA infection nor to guide or monitor treatment for MRSA infections.          Radiology Studies: No results found.      Scheduled Meds: . aspirin EC  81 mg Oral Daily  . atorvastatin  20 mg Oral Daily  . carvedilol  6.25 mg Oral Daily  . hydroxychloroquine  200 mg Oral Daily  . levETIRAcetam  1,000 mg Oral Daily  . sulfaSALAzine  500 mg Oral BID   Continuous Infusions:    LOS: 2 days     Harlo Fabela, MD, FACP, FHM. [facility] Pager [phone]  If 7PM-7AM, please contact night-coverage [email or web address] Password [facility] 08/13/2016, 9:56 AM

## 2016-08-14 DIAGNOSIS — R6510 Systemic inflammatory response syndrome (SIRS) of non-infectious origin without acute organ dysfunction: Secondary | ICD-10-CM | POA: Diagnosis not present

## 2016-08-14 DIAGNOSIS — N183 Chronic kidney disease, stage 3 (moderate): Secondary | ICD-10-CM

## 2016-08-14 DIAGNOSIS — M6281 Muscle weakness (generalized): Secondary | ICD-10-CM | POA: Diagnosis not present

## 2016-08-14 DIAGNOSIS — N178 Other acute kidney failure: Secondary | ICD-10-CM | POA: Diagnosis not present

## 2016-08-14 DIAGNOSIS — R197 Diarrhea, unspecified: Secondary | ICD-10-CM | POA: Diagnosis not present

## 2016-08-14 DIAGNOSIS — R739 Hyperglycemia, unspecified: Secondary | ICD-10-CM | POA: Diagnosis not present

## 2016-08-14 DIAGNOSIS — E876 Hypokalemia: Secondary | ICD-10-CM | POA: Diagnosis not present

## 2016-08-14 DIAGNOSIS — N179 Acute kidney failure, unspecified: Secondary | ICD-10-CM | POA: Diagnosis not present

## 2016-08-14 DIAGNOSIS — M069 Rheumatoid arthritis, unspecified: Secondary | ICD-10-CM | POA: Diagnosis not present

## 2016-08-14 DIAGNOSIS — R6511 Systemic inflammatory response syndrome (SIRS) of non-infectious origin with acute organ dysfunction: Secondary | ICD-10-CM | POA: Diagnosis not present

## 2016-08-14 DIAGNOSIS — R112 Nausea with vomiting, unspecified: Secondary | ICD-10-CM | POA: Diagnosis not present

## 2016-08-14 DIAGNOSIS — R2689 Other abnormalities of gait and mobility: Secondary | ICD-10-CM | POA: Diagnosis not present

## 2016-08-14 DIAGNOSIS — G9340 Encephalopathy, unspecified: Secondary | ICD-10-CM | POA: Diagnosis not present

## 2016-08-14 DIAGNOSIS — I10 Essential (primary) hypertension: Secondary | ICD-10-CM | POA: Diagnosis not present

## 2016-08-14 DIAGNOSIS — I160 Hypertensive urgency: Secondary | ICD-10-CM | POA: Diagnosis not present

## 2016-08-14 LAB — BASIC METABOLIC PANEL
Anion gap: 9 (ref 5–15)
BUN: 20 mg/dL (ref 6–20)
CO2: 18 mmol/L — ABNORMAL LOW (ref 22–32)
CREATININE: 1.39 mg/dL — AB (ref 0.44–1.00)
Calcium: 8.4 mg/dL — ABNORMAL LOW (ref 8.9–10.3)
Chloride: 113 mmol/L — ABNORMAL HIGH (ref 101–111)
GFR calc Af Amer: 41 mL/min — ABNORMAL LOW (ref >60)
GFR, EST NON AFRICAN AMERICAN: 35 mL/min — AB (ref >60)
GLUCOSE: 76 mg/dL (ref 65–99)
POTASSIUM: 4 mmol/L (ref 3.5–5.1)
Sodium: 140 mmol/L (ref 135–145)

## 2016-08-14 LAB — CSF CULTURE W GRAM STAIN: Culture: NO GROWTH

## 2016-08-14 LAB — EHRLICHIA ANTIBODY PANEL
E CHAFFEENSIS AB, IGM: NEGATIVE
E chaffeensis (HGE) Ab, IgG: NEGATIVE
E. Chaffeensis (HME) IgM Titer: NEGATIVE
E.Chaffeensis (HME) IgG: NEGATIVE

## 2016-08-14 LAB — MAGNESIUM: Magnesium: 1.8 mg/dL (ref 1.7–2.4)

## 2016-08-14 LAB — CSF CULTURE

## 2016-08-14 MED ORDER — CARVEDILOL 6.25 MG PO TABS: 6.2500 mg | ORAL_TABLET | Freq: Two times a day (BID) | ORAL | Status: DC

## 2016-08-14 MED ORDER — VITAMIN D (ERGOCALCIFEROL) 1.25 MG (50000 UNIT) PO CAPS: 50000.0000 [IU] | ORAL_CAPSULE | ORAL | Status: DC

## 2016-08-14 NOTE — Consult Note (Signed)
[facility] Primary Care Navigator  08/14/2016  [name] 1940-06-13 [id number]   Met with patient at the bedside to identify possible discharge needs.  Patient reports that she had inability to walk, became incoherent and had headache that had led to this admission.  Daughter endorses Dr. Glendale Chard with [facility] as herprimary care provider.   [facility] [address] [facility] any problem.   Patient's granddaughter Deaja Rizo.- lives close by) is managingher medications at home using "pill box" system filled monthly.  She states that husband [name]) provides transportation to herdoctors'appointments.   Patient mentioned that husband and granddaughter provide assistance with her needs at home.   Anticipated discharge plan is skilled nursing facility [facility]) for rehabilitation before returning home per therapy recommendation.  Patient voiced understanding to call primary care provider's office when she returns back home,for a post discharge follow-up visitwithin a week or sooner if needs arise.Patient letter (with PCP's contact number) was provided as herreminder.  Patient denied any health managementneeds or concerns at this time.  [facility] care management information provided for future needs that she may have.   For questions, please contact:  Dannielle Huh, BSN, RN- [facility] Primary Care Navigator  Telephone: [phone] [place]

## 2016-08-14 NOTE — Progress Notes (Addendum)
Patient will DC to: [facility] Anticipated DC date: 08/14/16 Family notified: Spouse Transport by: Corey Harold   Per MD patient ready for DC to [facility]. RN, patient, patient's family, and facility notified of DC. Discharge Summary sent to facility. Candace Cruise received. RN given number for report [phone]). DC packet on chart. Ambulance transport requested for patient.   CSW signing off.  Cedric Fishman, [place] Social Worker [phone]

## 2016-08-14 NOTE — Progress Notes (Signed)
Physical Therapy Treatment Patient Details Name: [name] MRN: [id number] DOB: Aug 03, 1940 Today's Date: 08/14/2016    History of Present Illness 76 year old female with a PMH of anxiety, depression, HLD, HTN, RA, lives with her spouse and great-granddaughter, presented with several days of headache, worsening confusion, and HTN.    PT Comments    Pt in bed on arrival with bed pan, urine soaked linens and stated she had been there for 30 min. Assisted pt off of bed pan, changed linens and assisted pt to [facility] prior to gait. Pt with increased gait distance today but continues to require assist for all mobility and SNF remains appropriate.Will continue to follow acutely, recommend OOB to [facility] at minimum with staff and daily mobility.   93% on RA with gait 98 HR    Follow Up Recommendations  SNF;Supervision/Assistance - 24 hour     Equipment Recommendations       Recommendations for Other Services       Precautions / Restrictions Precautions Precautions: Fall    Mobility  Bed Mobility Overal bed mobility: Needs Assistance Bed Mobility: Supine to Sit     Supine to sit: Min assist     General bed mobility comments: assist to bring legs off of bed and elevate trunk  Transfers Overall transfer level: Needs assistance   Transfers: Sit to/from Stand;Stand Pivot Transfers Sit to Stand: Min assist Stand pivot transfers: Min assist       General transfer comment: min assist for safety and balance with cues for hand placement to rise from bed, pivot to [facility] and stand from [facility]  Ambulation/Gait Ambulation/Gait assistance: Min assist Ambulation Distance (Feet): 100 Feet Assistive device: Rolling walker (2 wheeled) Gait Pattern/deviations: Step-through pattern;Decreased stride length;Trunk flexed         Stairs            Wheelchair Mobility    Modified Rankin (Stroke Patients Only)       Balance Overall balance assessment: Needs assistance   Sitting  balance-Leahy Scale: Fair       Standing balance-Leahy Scale: Poor                              Cognition Arousal/Alertness: Awake/alert Behavior During Therapy: Flat affect Overall Cognitive Status: No family/caregiver present to determine baseline cognitive functioning Area of Impairment: Safety/judgement;Problem solving                         Safety/Judgement: Decreased awareness of deficits;Decreased awareness of safety   Problem Solving: Slow processing;Decreased initiation;Requires verbal cues        Exercises      General Comments        Pertinent Vitals/Pain Pain Assessment: 0-10 Pain Score: 4  Pain Location: head Pain Descriptors / Indicators: Aching Pain Intervention(s): Limited activity within patient's tolerance;Repositioned;Monitored during session    Home Living                      Prior Function            PT Goals (current goals can now be found in the care plan section) Progress towards PT goals: Progressing toward goals    Frequency           PT Plan Current plan remains appropriate    Co-evaluation              AM-PAC PT "6 Clicks"  Daily Activity  Outcome Measure  Difficulty turning over in bed (including adjusting bedclothes, sheets and blankets)?: A Little Difficulty moving from lying on back to sitting on the side of the bed? : Total Difficulty sitting down on and standing up from a chair with arms (e.g., wheelchair, bedside commode, etc,.)?: A Lot Help needed moving to and from a bed to chair (including a wheelchair)?: A Little Help needed walking in hospital room?: A Little Help needed climbing 3-5 steps with a railing? : A Lot 6 Click Score: 14    End of Session Equipment Utilized During Treatment: Gait belt Activity Tolerance: Patient tolerated treatment well Patient left: in chair;with chair alarm set;with call bell/phone within reach Nurse Communication: Mobility status PT Visit  Diagnosis: Unsteadiness on feet (R26.81);Muscle weakness (generalized) (M62.81);History of falling (Z91.81);Difficulty in walking, not elsewhere classified (R26.2)     Time: 8675-4492 PT Time Calculation (min) (ACUTE ONLY): 29 min  Charges:  $Gait Training: 8-22 mins $Therapeutic Activity: 8-22 mins                    G Codes:       Elwyn Reach, PT [phone]   [place] 08/14/2016, 9:05 AM

## 2016-08-14 NOTE — Progress Notes (Signed)
[name] to be D/C'd Skilled nursing facility per MD order.  Discussed with the patient and all questions fully answered.  VSS, Skin clean, dry and intact without evidence of skin break down, no evidence of skin tears noted. IV catheter discontinued intact. Site without signs and symptoms of complications. Dressing and pressure applied.  An After Visit Summary was printed and given to PITA  D/c education completed with PITA including follow up instructions, medication list, d/c activities limitations if indicated, with other d/c instructions as indicated, report was given to PITA.  Patient escorted via  Brewing technologist , and D/C to [facility] ambulance.  Dorris Carnes 08/14/2016 2:03 PM

## 2016-08-14 NOTE — Clinical Social Work Note (Signed)
Clinical Social Work Assessment  Patient Details  Name: [name] MRN: [id number] Date of Birth: 09-13-1940  Date of referral:  08/14/16               Reason for consult:  Facility Placement                Permission sought to share information with:  Facility Sport and exercise psychologist, Family Supports Permission granted to share information::  Yes, Verbal Permission Granted  Name::     Risk manager::  SNFs  Relationship::  Spouse  Contact Information:  [phone]  Housing/Transportation Living arrangements for the past 2 months:  [place] of Information:  Patient, Spouse Patient Interpreter Needed:  None Criminal Activity/Legal Involvement Pertinent to Current Situation/Hospitalization:  No - Comment as needed Significant Relationships:  Spouse, Other Family Members Lives with:  Spouse Do you feel safe going back to the place where you live?  No Need for family participation in patient care:  No (Coment)  Care giving concerns:  CSW received consult for possible SNF placement at time of discharge. CSW spoke with patient and her husband regarding PT recommendation of SNF placement at time of discharge. Patient reported that patient's spouse is currently unable to care for patient at their home given patient's current physical needs and fall risk. Patient expressed understanding of PT recommendation and is agreeable to SNF placement at time of discharge. CSW to continue to follow and assist with discharge planning needs.   Social Worker assessment / plan:  CSW spoke with patient concerning possibility of rehab at [facility] before returning home.  Employment status:  Retired Nurse, adult PT Recommendations:  [place] / Referral to community resources:  [place]  Patient/Family's Response to care:  Patient recognizes need for rehab before returning home and is agreeable to a SNF in [place].  Patient reported preference for [facility] since she has been there before. CSW explained need for insurance authorization process.   Patient/Family's Understanding of and Emotional Response to Diagnosis, Current Treatment, and Prognosis:  Patient/family is realistic regarding therapy needs and expressed being hopeful for SNF placement. Patient expressed understanding of CSW role and discharge process as well as her medical condition. No questions/concerns about plan or treatment.    Emotional Assessment Appearance:  Appears stated age Attitude/Demeanor/Rapport:  Other (Appropriate) Affect (typically observed):  Accepting, Appropriate, Pleasant Orientation:  Oriented to Self, Oriented to Situation, Oriented to Place, Oriented to  Time Alcohol / Substance use:  Not Applicable Psych involvement (Current and /or in the community):  No (Comment)  Discharge Needs  Concerns to be addressed:  Care Coordination Readmission within the last 30 days:  No Current discharge risk:  None Barriers to Discharge:  No Barriers Identified   Benard Halsted, [place] 08/14/2016, 11:31 AM

## 2016-08-14 NOTE — NC FL2 (Signed)
[place] LEVEL OF CARE SCREENING TOOL     IDENTIFICATION  Patient Name: [name] Birthdate: [birth year] Sex: female Admission Date (Current Location): 08/10/2016  [facility] [place] Number:  Herbalist and Address:  [facility] [place] [place] [address], [place], [place] [address]      Provider Number: [id number]  Attending Physician Name and Address:  Modena Jansky, MD  Relative Name and Phone Number:  [name], spouse, [phone]    Current Level of Care: Hospital Recommended Level of Care: [place] Prior Approval Number:    Date Approved/Denied:   PASRR Number: [id number]  Discharge Plan: SNF    Current Diagnoses: Patient Active Problem List   Diagnosis Date Noted  . SIRS (systemic inflammatory response syndrome) ([place]) 08/11/2016  . Nausea vomiting and diarrhea 08/11/2016  . Renal failure (ARF), acute on chronic (HCC) 08/11/2016  . High risk medications (not anticoagulants) long-term use 04/20/2016  . Bilateral chronic knee pain 04/20/2016  . Pain in left elbow 04/20/2016  . Chronic kidney disease, stage III (moderate) 08/03/2015  . Encounter for general adult medical examination without abnormal findings 08/03/2015  . 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 Other long term (current) drug therapy 08/03/2015  . Pain in right shoulder 08/03/2015  . Postmenopausal bleeding 08/03/2015  . Vitamin D deficiency 08/03/2015  . Localized pain of right shoulder joint 08/03/2015  . Primary hyperparathyroidism ([place]) 07/16/2015  . Hyperparathyroidism, primary ([place]) 07/14/2015  . Gait disturbance 03/24/2015  . Neck pain 03/24/2015  . Myalgia and myositis 03/24/2015  . Acute kidney injury superimposed on chronic kidney disease ([place]) 02/06/2015  . Leukocytosis 02/06/2015  . Seizure ([place]) 02/05/2015  . Acute  encephalopathy 02/05/2015  . Abnormal MRI of head 02/02/2014  . Cognitive changes 02/02/2014  . Migraine without aura and without status migrainosus, not intractable 02/02/2014  . LOC (loss of consciousness) ([place])   . Syncope 01/12/2014  . Syncope and collapse 01/12/2014  . Hypertensive urgency 01/12/2014  . Rheumatoid arthritis ([place]) 01/12/2014  . Depression 01/12/2014  . Hyperlipidemia 01/12/2014    Orientation RESPIRATION BLADDER Height & Weight     Self, Time, Situation, Place  Normal Continent Weight: 58.2 kg (128 lb 4.9 oz) Height:  5\' 1"  (154.9 cm)  BEHAVIORAL SYMPTOMS/MOOD NEUROLOGICAL BOWEL NUTRITION STATUS      Continent Diet (Please see DC Summary)  AMBULATORY STATUS COMMUNICATION OF NEEDS Skin   Limited Assist Verbally Normal                       Personal Care Assistance Level of Assistance  Bathing, Feeding, Dressing Bathing Assistance: Limited assistance Feeding assistance: Limited assistance Dressing Assistance: Limited assistance     Functional Limitations Info             SPECIAL CARE FACTORS FREQUENCY  PT (By licensed PT)     PT Frequency: 5x/week              Contractures      Additional Factors Info  Code Status, Allergies Code Status Info: Full Code Allergies Info: NKA           Current Medications (08/14/2016):  This is the current hospital active medication list Current Facility-Administered Medications  Medication Dose Route Frequency Provider Last Rate Last Dose  . acetaminophen (TYLENOL) tablet 650 mg  650 mg Oral Q6H PRN Hal Hope,  Doreatha Lew, MD   650 mg at 08/13/16 1658   Or  . acetaminophen (TYLENOL) suppository 650 mg  650 mg Rectal Q6H PRN Rise Patience, MD      . aspirin EC tablet 81 mg  81 mg Oral Daily Modena Jansky, MD   81 mg at 08/13/16 0848  . atorvastatin (LIPITOR) tablet 20 mg  20 mg Oral Daily Rise Patience, MD   20 mg at 08/13/16 0847  . carvedilol (COREG) tablet 6.25 mg  6.25 mg Oral  Daily Rise Patience, MD   6.25 mg at 08/13/16 0848  . hydrALAZINE (APRESOLINE) injection 10 mg  10 mg Intravenous Q4H PRN Rise Patience, MD   10 mg at 08/14/16 0964  . hydroxychloroquine (PLAQUENIL) tablet 200 mg  200 mg Oral Daily Rise Patience, MD   200 mg at 08/13/16 0848  . levETIRAcetam (KEPPRA) tablet 1,000 mg  1,000 mg Oral Daily Rise Patience, MD   1,000 mg at 08/13/16 0848  . ondansetron (ZOFRAN) tablet 4 mg  4 mg Oral Q6H PRN Rise Patience, MD       Or  . ondansetron [facility]) injection 4 mg  4 mg Intravenous Q6H PRN Rise Patience, MD      . sulfaSALAzine (AZULFIDINE) EC tablet 500 mg  500 mg Oral BID Rise Patience, MD   500 mg at 08/13/16 2259     Discharge Medications: Please see discharge summary for a list of discharge medications.  Relevant Imaging Results:  Relevant Lab Results:   Additional Information SSn: Milnor [place], [place]

## 2016-08-14 NOTE — Discharge Summary (Signed)
Physician Discharge Summary  [name] FTD:[id number] DOB: 09/29/1940  PCP: Glendale Chard, MD  Admit date: 08/10/2016 Discharge date: 08/14/2016  Recommendations for Outpatient Follow-up:  1. M.D. at SNF in 5-7 days with repeat labs (BMP). Please follow final blood culture & Ehrlichia serology results that were sent from the hospital. 2. Dr. Glendale Chard, PCP upon discharge from SNF.  Home Health: None Equipment/Devices: None    Discharge Condition: Improved and stable  CODE STATUS: Full  Diet recommendation: Heart healthy diet.  Discharge Diagnoses:  Principal Problem:   SIRS (systemic inflammatory response syndrome) (HCC) Active Problems:   Hypertensive urgency   Seizure (HCC)   Nausea vomiting and diarrhea   Renal failure (ARF), acute on chronic [facility])   Brief Summary: 76 year old female with a PMH of anxiety, depression, HLD, HTN, RA, lives with her spouse and great-granddaughter, presented with 1 day history of confusion, inability to walk, headache and nonbloody emesis (this was history that was clarified from spouse on day of discharge). She usually ambulates with the help of a cane. No history of sick contacts or insect bites. No obvious source of infection on UA or chest x-ray. CT head negative. Lactate elevated at 2.5. Initial concern for meningitis. LP performed by IR. CSF not consistent with meningitis. Received IV labetalol for elevated blood pressure. Mental status significantly improved after blood pressure improved. Low index of infectious etiology and hence all antimicrobials discontinued. Acute kidney injury improved.   Assessment & Plan:   1. SIRS: Unclear etiology. Urine microscopy showed many bacteria but no pus cells. Blood cultures 2 negative to date. CSF culture: Negative, final report. Chest x-ray negative. Cryptococcal antigen negative.CSF: Too few to count white blood cells. 2 red blood cells >not consistent with bacterial meningitis. Had been  empirically started on IV vancomycin, ceftriaxone and doxycycline. Ehrlichia serology pending. RMSF IgM and IgG negative. All antimicrobials were discontinued. Has been stable for days. 2. Hypertensive urgency: Arrived with SBP of 200s.? Compliance over the last few days. Improved after IV labetalol in ED with improvement in mental status. Had been getting carvedilol 6.25 MG once daily. Noted periods of increased blood pressure during the evening hours and very early morning hours. Increased Carvedilol to 6.25 MG twice a day at discharge. Close outpatient follow-up. 3. Acute encephalopathy: Possibly from hypertensive encephalopathy and less likely due to infectious etiology. Low index of suspicion for seizure given clinical improvement with current treatment. CT head without acute findings. As per discussion with spouse today, mental status has significantly improved but has some periods of confusion. He states that mental status may be 80% improved. Close outpatient follow-up. Not sure if she has an element of mild dementia. 4. Nausea, vomiting and diarrhea: Resolved. DC contact isolation. Tolerating diet. 5. Headache: Patient reports chronic headaches. May have been made worse on admission due to hypertensive urgency. Intermittent mild chronic headache. None at this time. Discontinued Excedrin Migraine due to concern for excess aspirin and risks of withdrawal headaches. Outpatient follow-up and may consider neurology consultation for potential migraine headache and need for prophylactic medications and rescue therapy as deemed appropriate. 6. 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 Presented with creatinine of 2.05. May be related to dehydration and uncontrolled hypertension. Briefly hydrated with IV fluids. Creatinine had plateaued in the 1.4-1.5 range over the last 2 days. DC IV fluids. Creatinine has improved to 1.39 today. Follow-up BMP at SNF. 7. Rheumatoid  arthritis: Continue sulfasalazine and Plaquenil.  8.  Seizure disorder: Continue Keppra. Reports last seizure was approximately 2 years ago. 9. Hyperglycemia without DM diagnosis: A1c: 5.6. 10. Hypokalemia: Replaced.  11. Hypomagnesemia: Magnesium 1.4. Replaced and currently at 1.8. 12. Leukocytosis:? Stress margination. Almost normalized.   Consultants:  Interventional radiology for lumbar puncture   Procedures:  Lumbar puncture under fluoroscopy by IR  Discharge Instructions  Discharge Instructions    Call MD for:    Complete by:  As directed    Worsening confusion or mental status changes.   Call MD for:  difficulty breathing, headache or visual disturbances    Complete by:  As directed    Call MD for:  extreme fatigue    Complete by:  As directed    Call MD for:  persistant dizziness or light-headedness    Complete by:  As directed    Call MD for:  persistant nausea and vomiting    Complete by:  As directed    Call MD for:  severe uncontrolled pain    Complete by:  As directed    Call MD for:  temperature >100.4    Complete by:  As directed    Diet - low sodium heart healthy    Complete by:  As directed    Increase activity slowly    Complete by:  As directed        Medication List    STOP taking these medications   aspirin-acetaminophen-caffeine 250-250-65 MG tablet Commonly known as:  EXCEDRIN MIGRAINE     TAKE these medications   aspirin 81 MG tablet Take 81 mg by mouth daily.   atorvastatin 20 MG tablet Commonly known as:  LIPITOR Take 20 mg by mouth daily. Reported on 03/24/2015   carvedilol 6.25 MG tablet Commonly known as:  COREG Take 1 tablet (6.25 mg total) by mouth 2 (two) times daily with a meal. What changed:  when to take this   hydroxychloroquine 200 MG tablet Commonly known as:  PLAQUENIL Take 200 mg by mouth daily.   levETIRAcetam 500 MG tablet Commonly known as:  KEPPRA Take 2 tablets (1,000 mg total) by mouth daily.    sulfaSALAzine 500 MG EC tablet Commonly known as:  AZULFIDINE Take 1 tablet (500 mg total) by mouth 2 (two) times daily.   Vitamin D (Ergocalciferol) 50000 units Caps capsule Commonly known as:  DRISDOL Take 1 capsule (50,000 Units total) by mouth every 7 (seven) days. What changed:  when to take this  Another medication with the same name was removed. Continue taking this medication, and follow the directions you see here.       Contact information for follow-up providers    Glendale Chard, MD. Schedule an appointment as soon as possible for a visit.   Specialty:  Internal Medicine Why:  Upon discharge from SNF. Contact information: [address] [place] [address] [phone]        M.D. at SNF. Schedule an appointment as soon as possible for a visit.   Why:  To be seen in 5-7 days with repeat labs (BMP).           Contact information for after-discharge care    [facility] Follow up.   Specialty:  [place] Contact information: [address] [place] [phone]                 No Known Allergies    Procedures/Studies: Dg Chest 2 View  Result  Date: 08/10/2016 CLINICAL DATA:  admitted on 08/10/2016 with sepsis and to r/o meningitis. Pt has AMS and leukocytosis. EXAM: CHEST  2 VIEW COMPARISON:  02/06/2015 FINDINGS: Normal heart size and pulmonary vascularity. No focal airspace disease or consolidation in the lungs. No blunting of costophrenic angles. No pneumothorax. Mediastinal contours appear intact. Calcified and tortuous aorta. Degenerative changes in the spine. Surgical clips in the right upper quadrant. IMPRESSION: No active cardiopulmonary disease. Electronically Signed   By: Lucienne Capers M.D.   On: 08/10/2016 20:16   Ct Head Wo Contrast  Result Date: 08/10/2016 CLINICAL DATA:  Confusion and headaches EXAM: CT HEAD WITHOUT CONTRAST TECHNIQUE: Contiguous  axial images were obtained from the base of the skull through the vertex without intravenous contrast. COMPARISON:  02/05/2015 FINDINGS: Brain: Mild atrophic changes and chronic white matter ischemic change is seen. 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 One lies within the right thalamus, head of the caudate nucleus on the left as well as within the internal capsule on left. No findings to suggest acute hemorrhage, acute infarction or space-occupying mass lesion are seen. Vascular: No hyperdense vessel or unexpected calcification. Skull: Normal. Negative for fracture or focal lesion. Sinuses/Orbits: No acute finding. Other: None. IMPRESSION: Chronic atrophic and ischemic changes. No acute abnormality is noted. Electronically Signed   By: Inez Catalina M.D.   On: 08/10/2016 18:06   Dg Fluoro Guided Loc Of Needle/cath Tip For Spinal Inject Lt  Result Date: 08/11/2016 CLINICAL DATA:  Altered mental status and headache, worsening over the past 5 days. EXAM: DIAGNOSTIC LUMBAR PUNCTURE UNDER FLUOROSCOPIC GUIDANCE FLUOROSCOPY TIME:  Fluoroscopy Time: Radiation Exposure Index (if provided by the fluoroscopic device): 375.38 dose area product Number of Acquired Spot Images: 0 PROCEDURE: Informed consent was obtained from the patient prior to the procedure, including potential complications of headache, allergy, and pain. With the patient prone, the lower back was prepped with Betadine. 1% Lidocaine was used for local anesthesia. Lumbar puncture was performed at the L4 level using a 20 gauge needle with return of clear CSF. 8 ml of CSF were obtained for laboratory studies. The patient tolerated the procedure well and there were no apparent complications. IMPRESSION: Fluoroscopic guided lumbar puncture Electronically Signed   By: Andreas Newport M.D.   On: 08/11/2016 01:27      Subjective: Patient denies complaints. No headache reported. Tolerating diet without nausea, vomiting or diarrhea. As per RN, no acute issues  reported. As per spouse at bedside, history updated as above. Mental status has significantly improved but has periods of confusion.  Discharge Exam:  Vitals:   08/13/16 1647 08/13/16 2139 08/14/16 0552 08/14/16 0659  BP: (!) 169/77 (!) 141/62 (!) 175/77 133/68  Pulse: 75 87 73 82  Resp: 18 17 17    Temp: 98.6 F (37 C) 98.7 F (37.1 C) 98.3 F (36.8 C)   TempSrc: Oral Oral Oral   SpO2: 100% 96% 100%   Weight:      Height:        General exam: Pleasant elderly female sitting up comfortably in reclining chair this morning. Respiratory system: Clear to auscultation. Respiratory effort normal.  Cardiovascular system: S1 & S2 heard, RRR. No JVD, murmurs, rubs, gallops or clicks. No pedal edema.  Gastrointestinal system: Abdomen is nondistended, soft and nontender. No organomegaly or masses felt. Normal bowel sounds heard.  Central nervous system: Alert and oriented. No focal neurological deficits. No neck stiffness.  Extremities: Symmetric 5 x 5 power. Skin: No rashes, lesions or ulcers Psychiatry:  Judgement and insight appear normal. Mood & affect appropriate.     The results of significant diagnostics from this hospitalization (including imaging, microbiology, ancillary and laboratory) are listed below for reference.     Microbiology: Recent Results (from the past 240 hour(s))  Blood Culture (routine x 2)     Status: None (Preliminary result)   Collection Time: 08/10/16  7:48 PM  Result Value Ref Range Status   Specimen Description BLOOD RIGHT ANTECUBITAL  Final   Special Requests   Final    BOTTLES DRAWN AEROBIC AND ANAEROBIC Blood Culture adequate volume   Culture NO GROWTH 3 DAYS  Final   Report Status PENDING  Incomplete  Blood Culture (routine x 2)     Status: None (Preliminary result)   Collection Time: 08/10/16  8:03 PM  Result Value Ref Range Status   Specimen Description BLOOD RIGHT WRIST  Final   Special Requests   Final    BOTTLES DRAWN AEROBIC ONLY Blood  Culture adequate volume   Culture NO GROWTH 3 DAYS  Final   Report Status PENDING  Incomplete  CSF culture     Status: None   Collection Time: 08/11/16  1:01 AM  Result Value Ref Range Status   Specimen Description CSF  Final   Special Requests NONE  Final   Gram Stain   Final    CYTOSPIN SMEAR WBC PRESENT, PREDOMINANTLY MONONUCLEAR NO ORGANISMS SEEN    Culture NO GROWTH  Final   Report Status 08/14/2016 FINAL  Final  MRSA PCR Screening     Status: None   Collection Time: 08/11/16  9:07 AM  Result Value Ref Range Status   MRSA by PCR NEGATIVE NEGATIVE Final    Comment:        The GeneXpert MRSA Assay (FDA approved for NASAL specimens only), is one component of a comprehensive MRSA colonization surveillance program. It is not intended to diagnose MRSA infection nor to guide or monitor treatment for MRSA infections.      Labs: CBC:  Recent Labs Lab 08/10/16 1703 08/10/16 1720 08/11/16 0426 08/12/16 0231  WBC 15.3*  --  12.8* 11.2*  NEUTROABS 12.4*  --  9.0*  --   HGB 16.6* 18.0* 14.4 12.9  HCT 46.8* 53.0* 41.3 37.9  MCV 83.4  --  83.9 83.7  PLT 306  --  260 812   Basic Metabolic Panel:  Recent Labs Lab 08/10/16 1703 08/10/16 1720 08/11/16 0426 08/12/16 0231 08/12/16 1123 08/13/16 0408 08/14/16 0528  NA 137 139 139 138  --  142 140  K 3.7 3.8 3.3* 3.4*  --  3.7 4.0  CL 103 105 107 108  --  115* 113*  CO2 20*  --  22 21*  --  21* 18*  GLUCOSE 185* 190* 117* 86  --  83 76  BUN 26* 31* 20 21*  --  20 20  CREATININE 2.05* 1.90* 1.75* 1.46*  --  1.52* 1.39*  CALCIUM 9.4  --  8.4* 8.2*  --  8.4* 8.4*  MG  --   --   --   --  1.4*  --  1.8   Liver Function Tests:  Recent Labs Lab 08/10/16 1703 08/11/16 0426  AST 27 23  ALT 10* 10*  ALKPHOS 107 85  BILITOT 0.9 0.7  PROT 8.7* 7.2  ALBUMIN 4.2 3.5   CBG:  Recent Labs Lab 08/10/16 1702  GLUCAP 184*   Urinalysis    Component Value Date/Time   COLORURINE  YELLOW 08/10/2016 2101   APPEARANCEUR  HAZY (A) 08/10/2016 2101   LABSPEC 1.006 08/10/2016 2101   PHURINE 6.0 08/10/2016 2101   GLUCOSEU NEGATIVE 08/10/2016 2101   HGBUR SMALL (A) 08/10/2016 2101   BILIRUBINUR NEGATIVE 08/10/2016 2101   KETONESUR NEGATIVE 08/10/2016 2101   PROTEINUR 100 (A) 08/10/2016 2101   UROBILINOGEN 1.0 01/12/2014 2237   NITRITE NEGATIVE 08/10/2016 2101   LEUKOCYTESUR NEGATIVE 08/10/2016 2101    Discussed in detail with patient's spouse at bedside. Updated care and answered questions.  Time coordinating discharge: Over 30 minutes  SIGNED:  Vernell Leep, MD, FACP, [place]. [facility] Pager [phone]  If 7PM-7AM, please contact night-coverage [email or web address] Password [email or web address] 08/14/2016, 1:28 PM

## 2016-08-14 NOTE — Discharge Instructions (Addendum)

## 2016-08-14 NOTE — Clinical Social Work Placement (Signed)
   CLINICAL SOCIAL WORK PLACEMENT  NOTE  Date:  08/14/2016  Patient Details  Name: [name] MRN: [id number] Date of Birth: May 04, 1940  Clinical Social Work is seeking post-discharge placement for this patient at the [place] level of care (*CSW will initial, date and re-position this form in  chart as items are completed):  Yes   Patient/family provided with [facility] Work Department's list of facilities offering this level of care within the geographic area requested by the patient (or if unable, by the patient's family).  Yes   Patient/family informed of their freedom to choose among providers that offer the needed level of care, that participate in Medicare, Medicaid or managed care program needed by the patient, have an available bed and are willing to accept the patient.  Yes   Patient/family informed of [facility]'s ownership interest in [facility] and [facility], as well as of the fact that they are under no obligation to receive care at these facilities.  PASRR submitted to EDS on       PASRR number received on       Existing PASRR number confirmed on 08/14/16     FL2 transmitted to all facilities in geographic area requested by pt/family on 08/14/16     FL2 transmitted to all facilities within larger geographic area on       Patient informed that his/her managed care company has contracts with or will negotiate with certain facilities, including the following:        Yes   Patient/family informed of bed offers received.  Patient chooses bed at [facility]     Physician recommends and patient chooses bed at      Patient to be transferred to [facility] on 08/14/16.  Patient to be transferred to facility by [facility]     Patient family notified on 08/14/16 of transfer.  Name of family member notified:  John     PHYSICIAN Please prepare priority discharge summary, including medications, Please sign FL2      Additional Comment:    _______________________________________________ Benard Halsted, LCSWA 08/14/2016, 11:54 AM

## 2016-08-15 LAB — CULTURE, BLOOD (ROUTINE X 2)
Culture: NO GROWTH
Culture: NO GROWTH
SPECIAL REQUESTS: ADEQUATE
Special Requests: ADEQUATE

## 2016-08-18 DIAGNOSIS — G9340 Encephalopathy, unspecified: Secondary | ICD-10-CM | POA: Diagnosis not present

## 2016-08-18 DIAGNOSIS — N183 Chronic kidney disease, stage 3 (moderate): Secondary | ICD-10-CM | POA: Diagnosis not present

## 2016-08-18 DIAGNOSIS — M6281 Muscle weakness (generalized): Secondary | ICD-10-CM | POA: Diagnosis not present

## 2016-08-21 ENCOUNTER — Ambulatory Visit: Payer: Medicare PPO | Admitting: Rheumatology

## 2016-09-04 DIAGNOSIS — I129 Hypertensive chronic kidney disease with stage 1 through stage 4 chronic kidney disease, or unspecified chronic kidney disease: Secondary | ICD-10-CM | POA: Diagnosis not present

## 2016-09-04 DIAGNOSIS — F17219 Nicotine dependence, cigarettes, with unspecified nicotine-induced disorders: Secondary | ICD-10-CM | POA: Diagnosis not present

## 2016-09-04 DIAGNOSIS — N183 Chronic kidney disease, stage 3 (moderate): Secondary | ICD-10-CM | POA: Diagnosis not present

## 2016-09-04 DIAGNOSIS — Z79899 Other long term (current) drug therapy: Secondary | ICD-10-CM | POA: Diagnosis not present

## 2016-09-04 DIAGNOSIS — F17200 Nicotine dependence, unspecified, uncomplicated: Secondary | ICD-10-CM | POA: Diagnosis not present

## 2016-09-04 DIAGNOSIS — R6510 Systemic inflammatory response syndrome (SIRS) of non-infectious origin without acute organ dysfunction: Secondary | ICD-10-CM | POA: Diagnosis not present

## 2016-09-13 DIAGNOSIS — R6510 Systemic inflammatory response syndrome (SIRS) of non-infectious origin without acute organ dysfunction: Secondary | ICD-10-CM | POA: Diagnosis not present

## 2016-09-13 DIAGNOSIS — Z09 Encounter for follow-up examination after completed treatment for conditions other than malignant neoplasm: Secondary | ICD-10-CM | POA: Diagnosis not present

## 2016-09-23 ENCOUNTER — Emergency Department (HOSPITAL_COMMUNITY)
Admission: EM | Admit: 2016-09-23 | Discharge: 2016-09-23 | Disposition: A | Discharge status: Home / Self Care | Payer: Medicare PPO | Attending: Emergency Medicine | Admitting: Emergency Medicine

## 2016-09-23 ENCOUNTER — Encounter (HOSPITAL_COMMUNITY): Payer: Self-pay

## 2016-09-23 DIAGNOSIS — Z86718 Personal history of other venous thrombosis and embolism: Secondary | ICD-10-CM | POA: Insufficient documentation

## 2016-09-23 DIAGNOSIS — I160 Hypertensive urgency: Secondary | ICD-10-CM | POA: Diagnosis not present

## 2016-09-23 DIAGNOSIS — R51 Headache: Secondary | ICD-10-CM | POA: Diagnosis not present

## 2016-09-23 DIAGNOSIS — F17210 Nicotine dependence, cigarettes, uncomplicated: Secondary | ICD-10-CM | POA: Diagnosis not present

## 2016-09-23 DIAGNOSIS — I129 Hypertensive chronic kidney disease with stage 1 through stage 4 chronic kidney disease, or unspecified chronic kidney disease: Secondary | ICD-10-CM | POA: Diagnosis not present

## 2016-09-23 DIAGNOSIS — N183 Chronic kidney disease, stage 3 (moderate): Secondary | ICD-10-CM | POA: Insufficient documentation

## 2016-09-23 DIAGNOSIS — R110 Nausea: Secondary | ICD-10-CM | POA: Diagnosis not present

## 2016-09-23 DIAGNOSIS — I10 Essential (primary) hypertension: Secondary | ICD-10-CM | POA: Diagnosis not present

## 2016-09-23 DIAGNOSIS — Z79899 Other long term (current) drug therapy: Secondary | ICD-10-CM | POA: Diagnosis not present

## 2016-09-23 DIAGNOSIS — Z7982 Long term (current) use of aspirin: Secondary | ICD-10-CM | POA: Insufficient documentation

## 2016-09-23 DIAGNOSIS — R519 Headache, unspecified: Secondary | ICD-10-CM

## 2016-09-23 LAB — COMPREHENSIVE METABOLIC PANEL
ALK PHOS: 99 U/L (ref 38–126)
ALT: 11 U/L — AB (ref 14–54)
AST: 20 U/L (ref 15–41)
Albumin: 4 g/dL (ref 3.5–5.0)
Anion gap: 12 (ref 5–15)
BUN: 21 mg/dL — AB (ref 6–20)
CALCIUM: 9.2 mg/dL (ref 8.9–10.3)
CO2: 21 mmol/L — AB (ref 22–32)
CREATININE: 1.37 mg/dL — AB (ref 0.44–1.00)
Chloride: 104 mmol/L (ref 101–111)
GFR calc non Af Amer: 36 mL/min — ABNORMAL LOW (ref >60)
GFR, EST AFRICAN AMERICAN: 42 mL/min — AB (ref >60)
Glucose, Bld: 91 mg/dL (ref 65–99)
Potassium: 4.2 mmol/L (ref 3.5–5.1)
Sodium: 137 mmol/L (ref 135–145)
Total Bilirubin: 0.7 mg/dL (ref 0.3–1.2)
Total Protein: 8.3 g/dL — ABNORMAL HIGH (ref 6.5–8.1)

## 2016-09-23 LAB — URINALYSIS, ROUTINE W REFLEX MICROSCOPIC
Bilirubin Urine: NEGATIVE
Glucose, UA: NEGATIVE mg/dL
HGB URINE DIPSTICK: NEGATIVE
Ketones, ur: 5 mg/dL — AB
NITRITE: NEGATIVE
PROTEIN: 100 mg/dL — AB
SPECIFIC GRAVITY, URINE: 1.011 (ref 1.005–1.030)
pH: 5 (ref 5.0–8.0)

## 2016-09-23 LAB — CBC WITH DIFFERENTIAL/PLATELET
BASOS ABS: 0.1 10*3/uL (ref 0.0–0.1)
Basophils Relative: 1 %
Eosinophils Absolute: 0 10*3/uL (ref 0.0–0.7)
Eosinophils Relative: 0 %
HCT: 42.4 % (ref 36.0–46.0)
HEMOGLOBIN: 14.4 g/dL (ref 12.0–15.0)
LYMPHS ABS: 2.1 10*3/uL (ref 0.7–4.0)
LYMPHS PCT: 18 %
MCH: 29.2 pg (ref 26.0–34.0)
MCHC: 34 g/dL (ref 30.0–36.0)
MCV: 86 fL (ref 78.0–100.0)
Monocytes Absolute: 0.8 10*3/uL (ref 0.1–1.0)
Monocytes Relative: 6 %
NEUTROS ABS: 8.7 10*3/uL — AB (ref 1.7–7.7)
NEUTROS PCT: 75 %
Platelets: 292 10*3/uL (ref 150–400)
RBC: 4.93 MIL/uL (ref 3.87–5.11)
RDW: 14.4 % (ref 11.5–15.5)
WBC: 11.5 10*3/uL — AB (ref 4.0–10.5)

## 2016-09-23 MED ORDER — METOCLOPRAMIDE HCL 5 MG/ML IJ SOLN
10.0000 mg | Freq: Once | INTRAMUSCULAR | Status: AC
  Administered 2016-09-23: 10 mg via INTRAVENOUS
  Filled 2016-09-23: qty 2

## 2016-09-23 MED ORDER — CARVEDILOL 12.5 MG PO TABS
6.2500 mg | ORAL_TABLET | Freq: Once | ORAL | Status: AC
  Administered 2016-09-23: 6.25 mg via ORAL
  Filled 2016-09-23: qty 1

## 2016-09-23 MED ORDER — LEVETIRACETAM 500 MG PO TABS: 1000.0000 mg | ORAL_TABLET | Freq: Every day | ORAL | 0 refills | Status: DC

## 2016-09-23 MED ORDER — CARVEDILOL 6.25 MG PO TABS: 6.2500 mg | ORAL_TABLET | Freq: Two times a day (BID) | ORAL | 0 refills | Status: DC

## 2016-09-23 MED ORDER — LABETALOL HCL 5 MG/ML IV SOLN
10.0000 mg | Freq: Once | INTRAVENOUS | Status: AC
  Administered 2016-09-23: 10 mg via INTRAVENOUS
  Filled 2016-09-23: qty 4

## 2016-09-23 MED ORDER — ONDANSETRON HCL 4 MG PO TABS: 4.0000 mg | ORAL_TABLET | Freq: Three times a day (TID) | ORAL | 0 refills | Status: DC | PRN

## 2016-09-23 NOTE — ED Notes (Signed)
Pt stable, ambulatory, states understanding of discharge instructions 

## 2016-09-23 NOTE — ED Triage Notes (Addendum)
Pt presents to the ed with ptar for not feeling well. She has been having nausea and headache x 2 days.  Pt was hypertensive with ems. Alert and oriented, history of migraines.  Pt reports this feeling like a migraine to her, no neuro deficits present

## 2016-09-23 NOTE — ED Provider Notes (Signed)
[facility] DEPT Provider Note   CSN: [id number] Arrival date & time: 09/23/16  1301     History   Chief Complaint Chief Complaint  Patient presents with  . Headache    HPI [name] is a 76 y.o. female.  HPI patient with a gradual onset frontal headache starting this morning. States is similar to her previous chronic headaches. She had several episodes of vomiting and diarrhea prior to onset of headache and was only able to take her blood pressure medication. States she is not taking any of her blood pressure medication later in day because she is afraid she vomited back up. Denies any neck pain or stiffness. No new focal weakness or numbness. Patient denies any chest or abdominal pain.  Past Medical History:  Diagnosis Date  . Acute kidney injury superimposed on chronic kidney disease ([place]) 02/06/2015  . Anxiety   . Depression   . DVT (deep venous thrombosis) (HCC) 1970s   LLE  . Heart murmur   . Hypercholesterolemia   . Hypertension   . Kidney stones   . Migraine    "a few/year" (07/16/2015)  . Multiple falls   . Pneumonia "several times"  . Rheumatoid arthritis(714.0)    "all over" (07/16/2015)  . Seizure [facility])    "last one was 1//2017" (07/16/2015)  . Syncope and collapse   . Vision abnormalities     Patient Active Problem List   Diagnosis Date Noted  . SIRS (systemic inflammatory response syndrome) ([place]) 08/11/2016  . Nausea vomiting and diarrhea 08/11/2016  . Renal failure (ARF), acute on chronic (HCC) 08/11/2016  . High risk medications (not anticoagulants) long-term use 04/20/2016  . Bilateral chronic knee pain 04/20/2016  . Pain in left elbow 04/20/2016  . Chronic kidney disease, stage III (moderate) 08/03/2015  . Encounter for general adult medical examination without abnormal findings 08/03/2015  . 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 Other long  term (current) drug therapy 08/03/2015  . Pain in right shoulder 08/03/2015  . Postmenopausal bleeding 08/03/2015  . Vitamin D deficiency 08/03/2015  . Localized pain of right shoulder joint 08/03/2015  . Primary hyperparathyroidism ([place]) 07/16/2015  . Hyperparathyroidism, primary ([place]) 07/14/2015  . Gait disturbance 03/24/2015  . Neck pain 03/24/2015  . Myalgia and myositis 03/24/2015  . Acute kidney injury superimposed on chronic kidney disease ([place]) 02/06/2015  . Leukocytosis 02/06/2015  . Seizure ([place]) 02/05/2015  . Acute encephalopathy 02/05/2015  . Abnormal MRI of head 02/02/2014  . Cognitive changes 02/02/2014  . Migraine without aura and without status migrainosus, not intractable 02/02/2014  . LOC (loss of consciousness) ([place])   . Syncope 01/12/2014  . Syncope and collapse 01/12/2014  . Hypertensive urgency 01/12/2014  . Rheumatoid arthritis ([place]) 01/12/2014  . Depression 01/12/2014  . Hyperlipidemia 01/12/2014    Past Surgical History:  Procedure Laterality Date  . ABDOMINAL HYSTERECTOMY    . CARPAL TUNNEL RELEASE Bilateral   . CATARACT EXTRACTION W/ INTRAOCULAR LENS  IMPLANT, BILATERAL Bilateral   . NECK EXPLORATION  07/16/2015  . PARATHYROIDECTOMY Right 07/16/2015   superior  . PARATHYROIDECTOMY Right 07/16/2015   Procedure: RIGHT INFERIOR PARATHYROIDECTOMY;  Surgeon: Armandina Gemma, MD;  Location: [facility];  Service: General;  Laterality: Right;  . TOENAIL AVULSION Bilateral    great toe    OB History    No data available       Home Medications  Prior to Admission medications   Medication Sig Start Date End Date Taking? Authorizing Provider  aspirin EC 81 MG tablet Take 81 mg by mouth daily.   Yes [provider]  HYDROcodone-acetaminophen (NORCO/VICODIN) 5-325 MG tablet Take 1-2 tablets by mouth every 4 (four) hours as needed for moderate pain.   Yes [provider]  hydroxychloroquine (PLAQUENIL) 200 MG tablet Take 200 mg by mouth daily.   08/02/15  Yes [provider]  Vitamin D, Ergocalciferol, (DRISDOL) 50000 units CAPS capsule Take 1 capsule (50,000 Units total) by mouth every 7 (seven) days. 08/14/16  Yes Hongalgi, Lenis Dickinson, MD  carvedilol (COREG) 6.25 MG tablet Take 1 tablet (6.25 mg total) by mouth 2 (two) times daily with a meal. 09/23/16   Julianne Rice, MD  levETIRAcetam (KEPPRA) 500 MG tablet Take 2 tablets (1,000 mg total) by mouth daily. 09/23/16   Julianne Rice, MD  ondansetron (ZOFRAN) 4 MG tablet Take 1 tablet (4 mg total) by mouth every 8 (eight) hours as needed for nausea or vomiting. 09/23/16   Julianne Rice, MD    Family History Family History  Problem Relation Age of Onset  . Cancer Father   . Prostate cancer Father   . Cancer Brother   . Heart disease Brother   . Miscarriages / [place] Brother   . Heart disease Mother   . Pneumonia Mother     Social History Social History  Substance Use Topics  . Smoking status: Current Every Day Smoker    Packs/day: 1.00    Years: 62.00    Types: Cigarettes  . Smokeless tobacco: Never Used  . Alcohol use No     Allergies   Patient has no known allergies.   Review of Systems Review of Systems  Constitutional: Negative for chills and fever.  HENT: Negative for congestion, sinus pain, sinus pressure and sore throat.   Eyes: Negative for photophobia and visual disturbance.  Respiratory: Negative for cough and shortness of breath.   Cardiovascular: Negative for chest pain and leg swelling.  Gastrointestinal: Positive for diarrhea, nausea and vomiting. Negative for abdominal pain and blood in stool.  Genitourinary: Negative for dysuria, flank pain, frequency and hematuria.  Musculoskeletal: Negative for back pain, myalgias, neck pain and neck stiffness.  Skin: Negative for rash and wound.  Neurological: Positive for headaches. Negative for dizziness, seizures, syncope, weakness, light-headedness and numbness.  All other systems reviewed and  are negative.    Physical Exam Updated Vital Signs BP (!) 170/95   Pulse 71   Temp 99.5 F (37.5 C) (Oral)   Resp 16   Wt 59 kg (130 lb)   SpO2 96%   BMI 24.56 kg/m   Physical Exam  Constitutional: She is oriented to person, place, and time. She appears well-developed and well-nourished. No distress.  HENT:  Head: Normocephalic and atraumatic.  Mouth/Throat: Oropharynx is clear and moist. No oropharyngeal exudate.  Eyes: Pupils are equal, round, and reactive to light. EOM are normal.  Neck: Normal range of motion. Neck supple.  No meningismus  Cardiovascular: Normal rate and regular rhythm.  Exam reveals no gallop and no friction rub.   No murmur heard. Pulmonary/Chest: Effort normal and breath sounds normal. No respiratory distress. She has no wheezes. She has no rales.  Abdominal: Soft. Bowel sounds are normal. There is no tenderness. There is no rebound and no guarding.  Musculoskeletal: Normal range of motion. She exhibits no edema or tenderness.  No extremity swelling, asymmetry or tenderness. Distal  pulses are 2+.  Neurological: She is alert and oriented to person, place, and time.  Moving all extremities without deficit. Sensation fully intact. Cranial nerves II through XII grossly intact.  Skin: Skin is warm and dry. Capillary refill takes less than 2 seconds. No rash noted. No erythema.  Psychiatric: She has a normal mood and affect. Her behavior is normal.  Nursing note and vitals reviewed.    ED Treatments / Results  Labs (all labs ordered are listed, but only abnormal results are displayed) Labs Reviewed  CBC WITH DIFFERENTIAL/PLATELET - Abnormal; Notable for the following:       Result Value   WBC 11.5 (*)    Neutro Abs 8.7 (*)    All other components within normal limits  COMPREHENSIVE METABOLIC PANEL - Abnormal; Notable for the following:    CO2 21 (*)    BUN 21 (*)    Creatinine, Ser 1.37 (*)    Total Protein 8.3 (*)    ALT 11 (*)    GFR calc non  Af Amer 36 (*)    GFR calc Af Amer 42 (*)    All other components within normal limits  URINALYSIS, ROUTINE W REFLEX MICROSCOPIC - Abnormal; Notable for the following:    Ketones, ur 5 (*)    Protein, ur 100 (*)    Leukocytes, UA TRACE (*)    Bacteria, UA RARE (*)    Squamous Epithelial / LPF 0-5 (*)    All other components within normal limits    EKG  EKG Interpretation None       Radiology No results found.  Procedures Procedures (including critical care time)  Medications Ordered in ED Medications  labetalol (NORMODYNE,TRANDATE) injection 10 mg (10 mg Intravenous Given 09/23/16 1727)  metoCLOPramide (REGLAN) injection 10 mg (10 mg Intravenous Given 09/23/16 1726)  carvedilol (COREG) tablet 6.25 mg (6.25 mg Oral Given 09/23/16 1949)     Initial Impression / Assessment and Plan / ED Course  I have reviewed the triage vital signs and the nursing notes.  Pertinent labs & imaging results that were available during my care of the patient were reviewed by me and considered in my medical decision making (see chart for details).     Headache is resolved with improvement in blood pressure. Patient's been taking her Coreg only once daily. She's been noncompliant with her Keppra. I have emphasized the need to take the Coreg as prescribed and Keppra as prescribed. She'll follow with her primary physician. We'll give prescription for nausea medication as needed. Return precautions given.  Final Clinical Impressions(s) / ED Diagnoses   Final diagnoses:  Uncontrolled hypertension  Nonintractable headache, unspecified chronicity pattern, unspecified headache type    New Prescriptions New Prescriptions   ONDANSETRON (ZOFRAN) 4 MG TABLET    Take 1 tablet (4 mg total) by mouth every 8 (eight) hours as needed for nausea or vomiting.     Julianne Rice, MD 09/23/16 2149

## 2016-11-27 ENCOUNTER — Other Ambulatory Visit: Payer: Self-pay | Admitting: Neurology

## 2016-12-13 DIAGNOSIS — Z720 Tobacco use: Secondary | ICD-10-CM | POA: Diagnosis not present

## 2016-12-13 DIAGNOSIS — G40909 Epilepsy, unspecified, not intractable, without status epilepticus: Secondary | ICD-10-CM | POA: Diagnosis not present

## 2016-12-13 DIAGNOSIS — M069 Rheumatoid arthritis, unspecified: Secondary | ICD-10-CM | POA: Diagnosis not present

## 2016-12-13 DIAGNOSIS — E559 Vitamin D deficiency, unspecified: Secondary | ICD-10-CM | POA: Diagnosis not present

## 2016-12-13 DIAGNOSIS — I10 Essential (primary) hypertension: Secondary | ICD-10-CM | POA: Diagnosis not present

## 2016-12-13 DIAGNOSIS — Z6821 Body mass index (BMI) 21.0-21.9, adult: Secondary | ICD-10-CM | POA: Diagnosis not present

## 2016-12-18 DIAGNOSIS — Z1231 Encounter for screening mammogram for malignant neoplasm of breast: Secondary | ICD-10-CM | POA: Diagnosis not present

## 2017-01-01 DIAGNOSIS — N183 Chronic kidney disease, stage 3 (moderate): Secondary | ICD-10-CM | POA: Diagnosis not present

## 2017-01-01 DIAGNOSIS — I129 Hypertensive chronic kidney disease with stage 1 through stage 4 chronic kidney disease, or unspecified chronic kidney disease: Secondary | ICD-10-CM | POA: Diagnosis not present

## 2017-01-01 DIAGNOSIS — M069 Rheumatoid arthritis, unspecified: Secondary | ICD-10-CM | POA: Diagnosis not present

## 2017-01-01 DIAGNOSIS — R51 Headache: Secondary | ICD-10-CM | POA: Diagnosis not present

## 2017-01-29 ENCOUNTER — Other Ambulatory Visit: Payer: Self-pay | Admitting: Rheumatology

## 2017-01-29 NOTE — Telephone Encounter (Addendum)
Patient needs an appointment and labs before refill.

## 2017-02-26 DIAGNOSIS — I129 Hypertensive chronic kidney disease with stage 1 through stage 4 chronic kidney disease, or unspecified chronic kidney disease: Secondary | ICD-10-CM | POA: Diagnosis not present

## 2017-02-26 DIAGNOSIS — N183 Chronic kidney disease, stage 3 (moderate): Secondary | ICD-10-CM | POA: Diagnosis not present

## 2017-02-26 DIAGNOSIS — M069 Rheumatoid arthritis, unspecified: Secondary | ICD-10-CM | POA: Diagnosis not present

## 2017-02-26 DIAGNOSIS — R197 Diarrhea, unspecified: Secondary | ICD-10-CM | POA: Diagnosis not present

## 2017-05-21 DIAGNOSIS — Z6822 Body mass index (BMI) 22.0-22.9, adult: Secondary | ICD-10-CM | POA: Diagnosis not present

## 2017-05-21 DIAGNOSIS — M069 Rheumatoid arthritis, unspecified: Secondary | ICD-10-CM | POA: Diagnosis not present

## 2017-05-21 DIAGNOSIS — R634 Abnormal weight loss: Secondary | ICD-10-CM | POA: Diagnosis not present

## 2017-05-21 DIAGNOSIS — R350 Frequency of micturition: Secondary | ICD-10-CM | POA: Diagnosis not present

## 2017-05-21 DIAGNOSIS — E46 Unspecified protein-calorie malnutrition: Secondary | ICD-10-CM | POA: Diagnosis not present

## 2017-05-21 DIAGNOSIS — Z79899 Other long term (current) drug therapy: Secondary | ICD-10-CM | POA: Diagnosis not present

## 2017-07-02 DIAGNOSIS — N183 Chronic kidney disease, stage 3 (moderate): Secondary | ICD-10-CM | POA: Diagnosis not present

## 2017-07-02 DIAGNOSIS — Z720 Tobacco use: Secondary | ICD-10-CM | POA: Diagnosis not present

## 2017-07-02 DIAGNOSIS — I129 Hypertensive chronic kidney disease with stage 1 through stage 4 chronic kidney disease, or unspecified chronic kidney disease: Secondary | ICD-10-CM | POA: Diagnosis not present

## 2017-09-03 DIAGNOSIS — E559 Vitamin D deficiency, unspecified: Secondary | ICD-10-CM | POA: Diagnosis not present

## 2017-09-03 DIAGNOSIS — F329 Major depressive disorder, single episode, unspecified: Secondary | ICD-10-CM | POA: Diagnosis not present

## 2017-09-03 DIAGNOSIS — Z1331 Encounter for screening for depression: Secondary | ICD-10-CM | POA: Diagnosis not present

## 2017-09-03 DIAGNOSIS — N183 Chronic kidney disease, stage 3 (moderate): Secondary | ICD-10-CM | POA: Diagnosis not present

## 2017-09-03 DIAGNOSIS — I129 Hypertensive chronic kidney disease with stage 1 through stage 4 chronic kidney disease, or unspecified chronic kidney disease: Secondary | ICD-10-CM | POA: Diagnosis not present

## 2017-09-03 DIAGNOSIS — M069 Rheumatoid arthritis, unspecified: Secondary | ICD-10-CM | POA: Diagnosis not present

## 2017-09-03 DIAGNOSIS — M25522 Pain in left elbow: Secondary | ICD-10-CM | POA: Diagnosis not present

## 2017-09-03 DIAGNOSIS — Z0000 Encounter for general adult medical examination without abnormal findings: Secondary | ICD-10-CM | POA: Diagnosis not present

## 2017-09-12 ENCOUNTER — Other Ambulatory Visit: Payer: Self-pay

## 2017-09-12 NOTE — Patient Outreach (Signed)
[facility]) Care Management  09/12/2017  MAYTE DIERS May 20, 1940 [id number]   Referral Date: 09/12/17 Referral Source: MD referral Referral Reason: medication management, meals on wheels, and transportation   Outreach Attempt: No answer. HIPAA compliant voice message left.  Plan: RN CM will send letter and attempt patient again within 4 business days.    Jone Baseman, RN, MSN [facility] Care Management Care Management Coordinator Direct Line [phone] Toll Free: [phone]  Fax: [phone]

## 2017-09-14 ENCOUNTER — Encounter: Payer: Self-pay | Admitting: Hematology & Oncology

## 2017-09-14 ENCOUNTER — Other Ambulatory Visit: Payer: Self-pay

## 2017-09-14 NOTE — Patient Outreach (Signed)
[facility]) Care [facility]  09/14/2017  [name] [birth year] [id number]   Referral Date: 09/12/17 Referral Source: MD referral Referral Reason: medication Adams, meals on wheels, and transportation   Outreach Attempt: No answer. HIPAA compliant voice message left.  Plan: RN CM will attempt patient again within 4 business days.    Jone Baseman, RN, MSN [name] Care Adams Coordinator Direct Line [phone] Cell [phone] Toll Free: [phone]  Fax: [phone]

## 2017-09-18 ENCOUNTER — Other Ambulatory Visit: Payer: Self-pay

## 2017-09-18 NOTE — Patient Outreach (Signed)
[facility]) Care Management  09/18/2017  [name] Jul 01, 1940 [id number]   Referral Date:09/12/17 Referral Source:MD referral Referral Reason:medication management, meals on wheels, and transportation  Outreach Attempt: spoke with patient.  She is able to verify HIPAA.  Discussed with patient reason for referral. Patient states that she does need some help with meals.  She states that her husband takes care of most meals but she is unable to fix meals and normally eats things like cereal when he is away from the home. She also states that she normally gets to appointments via her spouse, granddaughter or her cousin but sometimes it does not always work out.  Discussed with patient her medications.  She states that her granddaughter fills her pill box regularly and is able to take medications as prescribed.  She denies needing any assistance with medications.  Discussed with patient [facility] services.  She is agreeable to social work for meals on wheels and transportation resources.  Patient admits to arthritis and HTN but reports that her HTN is controlled with medications and declines any further education and support.    Patient lives in the home with spouse and grandson who is in school.  She states that her granddaughter comes by to assist with medications and bathing & dressing. She reports she gets around via walker and reports one fall last month sometime.  She denies any injury and that her husband was present.   Patient saw PCP about 2 weeks ago and cousin provided transportation.     Plan RN CM will refer to social work for transportation resources and meals on wheels.    Jone Baseman, RN, MSN [facility] Care Management Coordinator Direct Line [phone] Cell [phone] Toll Free: [phone]  Fax: [phone]

## 2017-09-18 NOTE — Addendum Note (Signed)
Addended by: Jone Baseman on: 09/18/2017 09:19 AM   Modules accepted: Orders

## 2017-09-19 ENCOUNTER — Ambulatory Visit: Payer: Self-pay

## 2017-09-20 ENCOUNTER — Other Ambulatory Visit: Payer: Self-pay

## 2017-09-20 NOTE — Patient Outreach (Signed)
[facility]) Care Management  09/20/2017  CHARISA TWITTY 1940-03-03 [id number]  Successful outreach to the patient on today's date, HIPAA identifiers confirmed. BSW introduced self to the patient and the reason for today's call, indicating the patient had been referred for assistance with transportation resources. The patient confirmed she is in need of transportation resources. The patient reports her husband and grand-daughter normally transport her but her grand-daughter has recently began working full-time and is not available to assist. The patient also report her husband still works full-time and has limited ability to assist.   BSW spoke with the patient about SCAT as well as senior wheels. The patient is interested in applying for SCAT. BSW completed a SCAT application telephonically with the patient. BSW explained to the patient that once the application is processed, [employer]AT will contact the patient directly to schedule an eligibility hearing. BSW to mail patient contact information to [employer]AT as well as a brief overview of services.  The patient reports recent weight loss due to concerns with not eating. The patient reports she no longer cooks stating "everytime I cook, I burn it". BSW spoke with the patient about mobile meals. The patient is interested in this program and understands there is a long wait list. BSW encouraged the patient to have her husband prepare a lunch prior to leaving for work to allow the patient to eat throughout the day. The patient reported "I can make a sandwich".   The patient also discloses concerns of being unable to dress herself due to severe arthritis stating "I can't lift my arms". The patient reports her grand-daughter assists daily with dressing. The patient believes she may have Medicaid but is unable to confirm. BSW explained personal care services benefit provided under Medicaid. BSW encouraged the patient to look for this card with her  husband. BSW will assist the patient in applying for PCS services once the patient can verify she has Medicaid coverage.  BSW will follow up with the patient in the next two weeks.  [name], BSW, CDP [facility] [phone]

## 2017-09-21 ENCOUNTER — Ambulatory Visit: Payer: Self-pay

## 2017-10-01 ENCOUNTER — Other Ambulatory Visit: Payer: Self-pay

## 2017-10-01 NOTE — Patient Outreach (Signed)
[facility]) Care Management  10/01/2017  [name] [birth year] [id number]  Successful outreach to the patient on today's date, HIPAA identifiers confirmed. The patient denies being contacted by SCAT at this time. BSW to follow-up with the SCAT eligibility office to confirm receipt of the patients application.  The patient informs BSW she does not have Medicaid like she thought. BSW offered to assist the patient in applying for Medicaid if the patient is interested. Unfortunately, the patients reports a combined income well over the eligibility limit for Medicaid coverage.   The patient requests BSW assist with mobile meals. BSW reminds the patient she has been referred to the program and placed on the waiting list. BSW asked the patient if she has ever been hungry and unable to obtain food, the patient answered "no". BSW asked the patient if she has ever been concerned she would run out of food and being unable to buy more, the patient stated "no". The patient reports owning a microwave and being physically capable of warming food in the microwave independently. Unfortunately, the patient denies anyone being capable of leaving food for her to warm while home.   BSW noted the patients referral indicating the patient has scored a 17 on the SLUMS scale indicating dementia. The patient gave BSW verbal permission to contact her husband, [name], to continue discussion regarding patient needs. BSW left Mr. Timme a HIPAA compliant voice message requesting a return call.  [name], BSW, CDP [facility] [phone]

## 2017-10-01 NOTE — Patient Outreach (Signed)
[facility]) Care Management  10/01/2017  RAIZY AUZENNE 10/18/1940 [id number]  Incoming call from the patients spouse, Cylee Dattilo. Mr. Hoselton was able to verify the patients HIPAA identifiers. BSW discussed the patients barriers to eating while he is away from the home. Mr. Dignan reports the patient has severe Rheumatoid Arthritis and states the patient is unable to lift a glass of water. The patient stays in bed most of the day. Mr. Knickerbocker reports his grand-daughter gets off work at 1:00 pm and goes to check on the patient. Mr. Deprey gets off work at 3:30 pm.  BSW discussed arranging mobile meals for the patient. Mr. Stille assures this BSW the patient is able to get out of bed and feed herself. However, Mr. Verrilli reports the patient has difficulty opening the door so they leave the door unlocked to the home "so she can tell visitors to open the door". BSW spoke with Mr. Henkin about mobile meals being a temporary program and encouraged him to begin leaving food for the patient to warm up. He stated understanding. Unfortunately, Mr Arocho does not feel the patient will warm the food when home alone.  BSW spoke with Mr. Pauls about other services through [facility]. Mr. Esperanza has declined both pharmacy and nursing services at this time. Mr. Munoz is agreeable to receiving information regarding [facility].   Plan:  BSW to mail [facility] brochure and call to confirm receipt of mailing in the next two weeks BSW has contacted Roselind Messier, mobile meals coordinator to refer the patient for 30 day meal delivery.   [name], BSW, CDP [facility] [phone]

## 2017-10-01 NOTE — Patient Outreach (Signed)
[facility]) Care Management  10/01/2017  [name] 16-Dec-1940 [id number]  BSW contacted the patients primary care provider to discuss patients referral. Dr. Baird Cancer stated the patient has been in the office recently accompanied by her cousin. Dr. Baird Cancer reports the patients cousin reporting the patient not eating but one meal per day due to lack of food. Dr. Baird Cancer reports giving the patient samples of boost.  BSW discussed the patients self reports of having food and being able to fix food as desired. BSW further discussed the patients spouse reports of difficulty opening doors and holding a glass of water. BSW has updated Dr. Baird Cancer that mobile meals will be arranged. BSW also reported the patients declination of both pharmacy and nurse case management services. Dr. Baird Cancer thanked this BSW for communication. Dr. Baird Cancer also informed BSW the patient is noncompliant with RA medication.   [name], BSW, CDP [facility] [phone]

## 2017-10-02 ENCOUNTER — Ambulatory Visit: Payer: Self-pay

## 2017-10-02 DIAGNOSIS — M25532 Pain in left wrist: Secondary | ICD-10-CM | POA: Diagnosis not present

## 2017-10-02 DIAGNOSIS — M25522 Pain in left elbow: Secondary | ICD-10-CM | POA: Diagnosis not present

## 2017-10-02 DIAGNOSIS — M25531 Pain in right wrist: Secondary | ICD-10-CM | POA: Diagnosis not present

## 2017-10-03 ENCOUNTER — Other Ambulatory Visit: Payer: Self-pay

## 2017-10-03 NOTE — Patient Outreach (Signed)
[facility]) Care Management  10/03/2017  [name] 1941/01/12 [id number]  Case Discussion with assistant clinical director, Bary Castilla on today's date to discuss complexity of patients case. BSW has also consulted with CSW Nat Christen also with [facility] Care Management, who has agreed to provide an in home assessment for continued support of the patient. BSW to contact the patient and notify of this referral.  Successful call to the patient on today's date, HIPAA identifiers confirmed. BSW notified the patient that mobile meals will begin tomorrow, 9/20, and end on October 30. The patient stated understanding. BSW also informed the patient that CSW, Nat Christen, would contact her to continue assessing and assisting with patient needs. The patient stated understanding and thanked this BSW for assistance.  [name], BSW, CDP [facility] [phone]

## 2017-10-05 ENCOUNTER — Encounter: Payer: Self-pay | Admitting: *Deleted

## 2017-10-05 ENCOUNTER — Other Ambulatory Visit: Payer: Self-pay | Admitting: *Deleted

## 2017-10-05 NOTE — Patient Outreach (Signed)
[facility]) Care Management  10/05/2017  [name] February 23, 1940 [id number]   CSW was able to make initial contact with patient today to perform phone assessment, as well as assess and assist with social work needs and services.  CSW introduced self, explained role and types of services provided through [facility] ([facility]).  CSW further explained to patient that CSW works with patient's BSW, also with [facility], [name]. CSW then explained the reason for the call, indicating that [name] thought that patient would benefit from social work services and resources to assist with counseling and supportive services to help cope with symptoms of depression.  CSW obtained two HIPAA compliant identifiers from patient, which included patient's name and date of birth. Patient reported that her Mobile Meals, through [employer] [facility], started today and that she was able to go to the door to retrieve the meal from the delivery driver.  Patient believes that the meal delivery will work well for her and is appreciative of [name] for arranging this service for her.  Patient was a little reluctant to schedule a home visit with CSW, but finally agreed to meet on Friday, October 19, 2017 at 11:00AM.  CSW explained to patient that [facility] will be providing counseling and supportive services to patient to try and help patient better cope with symptoms of depression.  CSW will print EMMI information for patient, pertaining specifically to "Signs and Symptoms of Depression", and bring with her to the initial home visit for review with patient.  CSW will also teach patient deep breathing exercises and relaxation techniques.  Patient voiced understanding and was agreeable to this plan. [facility] CM Care Plan Problem One     Most Recent Value  Care Plan Problem One  Patient is experiencing symptoms of depression.  Role Documenting the  Problem One  Clinical Social Worker  Care Plan for Problem One  Active  [facility] Long Term Goal   Patient will have a reduction in symptoms of depression, through counseling and supportive services, within the next 45 days.  [facility]N Long Term Goal Start Date  10/05/17  Interventions for Problem One Long Term Goal  CSW will provide counseling and supportive services to patient to help patient better cope with symptoms of depression.  [facility] CM Short Term Goal #1   Patient will meet with CSW for an intiail home visit to receive counseling and supportive services for symptoms of depression, within the next two weeks.  [facility] CM Short Term Goal #1 Start Date  10/05/17  Interventions for Short Term Goal #1  CSW was able to schedule an initial home visit with patient, which will take place on Friday, October 19, 2017 at 11:00AM.  [facility] CM Short Term Goal #2   Patient will review EMMI information provided to her by CSW, pertaining specifically to "Sign and Symptoms of Depression", within the next 30 days.  [facility] CM Short Term Goal #2 Start Date  10/05/17  Interventions for Short Term Goal #2  CSW will print EMMI information for patient and provide to patient during the initial home visit.  [facility] CM Short Term Goal #3  Patient will practice deep breathing exercises and relaxation techniques to help cope with symptoms of depression, within the next 30 days.  [facility] CM Short Term Goal #3 Start Date  10/05/17  Interventions for Short Tern Goal #3  CSW will teach patient proper use of relaxation techniques and  deep breathing exercises.    Nat Christen, BSW, MSW, LCSW  Licensed Education officer, [facility]  Mailing [place] [address], [place], [place] [address] Physical Address-[address], [place], [place] [address] Toll Free Main # [phone] Fax # [phone] Cell # [phone]  Office # [phone] [email or web address]

## 2017-10-08 ENCOUNTER — Inpatient Hospital Stay: Payer: Medicare PPO | Attending: Hematology & Oncology | Admitting: Hematology & Oncology

## 2017-10-08 ENCOUNTER — Inpatient Hospital Stay: Payer: Medicare PPO

## 2017-10-08 VITALS — BP 207/100 | HR 96 | Temp 98.2°F | Resp 16 | Wt 108.2 lb

## 2017-10-08 DIAGNOSIS — Z8042 Family history of malignant neoplasm of prostate: Secondary | ICD-10-CM | POA: Insufficient documentation

## 2017-10-08 DIAGNOSIS — F17210 Nicotine dependence, cigarettes, uncomplicated: Secondary | ICD-10-CM

## 2017-10-08 DIAGNOSIS — R634 Abnormal weight loss: Secondary | ICD-10-CM | POA: Diagnosis not present

## 2017-10-08 DIAGNOSIS — N189 Chronic kidney disease, unspecified: Secondary | ICD-10-CM

## 2017-10-08 DIAGNOSIS — F418 Other specified anxiety disorders: Secondary | ICD-10-CM | POA: Insufficient documentation

## 2017-10-08 DIAGNOSIS — I129 Hypertensive chronic kidney disease with stage 1 through stage 4 chronic kidney disease, or unspecified chronic kidney disease: Secondary | ICD-10-CM | POA: Diagnosis not present

## 2017-10-08 DIAGNOSIS — Z7982 Long term (current) use of aspirin: Secondary | ICD-10-CM | POA: Diagnosis not present

## 2017-10-08 DIAGNOSIS — M069 Rheumatoid arthritis, unspecified: Secondary | ICD-10-CM | POA: Diagnosis not present

## 2017-10-08 DIAGNOSIS — D472 Monoclonal gammopathy: Secondary | ICD-10-CM | POA: Diagnosis not present

## 2017-10-08 DIAGNOSIS — M129 Arthropathy, unspecified: Secondary | ICD-10-CM | POA: Insufficient documentation

## 2017-10-08 DIAGNOSIS — Z809 Family history of malignant neoplasm, unspecified: Secondary | ICD-10-CM | POA: Diagnosis not present

## 2017-10-08 DIAGNOSIS — E7800 Pure hypercholesterolemia, unspecified: Secondary | ICD-10-CM

## 2017-10-08 DIAGNOSIS — Z79899 Other long term (current) drug therapy: Secondary | ICD-10-CM | POA: Diagnosis not present

## 2017-10-08 DIAGNOSIS — Z86718 Personal history of other venous thrombosis and embolism: Secondary | ICD-10-CM | POA: Diagnosis not present

## 2017-10-08 LAB — CBC WITH DIFFERENTIAL (CANCER CENTER ONLY)
Basophils Absolute: 0.1 10*3/uL (ref 0.0–0.1)
Basophils Relative: 1 %
Eosinophils Absolute: 0.1 10*3/uL (ref 0.0–0.5)
Eosinophils Relative: 1 %
HEMATOCRIT: 42.7 % (ref 34.8–46.6)
Hemoglobin: 14.4 g/dL (ref 11.6–15.9)
LYMPHS ABS: 2.3 10*3/uL (ref 0.9–3.3)
Lymphocytes Relative: 24 %
MCH: 29 pg (ref 26.0–34.0)
MCHC: 33.7 g/dL (ref 32.0–36.0)
MCV: 85.9 fL (ref 81.0–101.0)
Monocytes Absolute: 0.9 10*3/uL (ref 0.1–0.9)
Monocytes Relative: 9 %
NEUTROS ABS: 6.3 10*3/uL (ref 1.5–6.5)
Neutrophils Relative %: 65 %
Platelet Count: 345 10*3/uL (ref 145–400)
RBC: 4.97 MIL/uL (ref 3.70–5.32)
RDW: 14 % (ref 11.1–15.7)
WBC: 9.6 10*3/uL (ref 3.9–10.0)

## 2017-10-08 LAB — CMP (CANCER CENTER ONLY)
ALK PHOS: 111 U/L — AB (ref 26–84)
ALT: 17 U/L (ref 10–47)
ANION GAP: 3 — AB (ref 5–15)
AST: 26 U/L (ref 11–38)
Albumin: 3.8 g/dL (ref 3.5–5.0)
BILIRUBIN TOTAL: 0.5 mg/dL (ref 0.2–1.6)
BUN: 19 mg/dL (ref 7–22)
CO2: 28 mmol/L (ref 18–33)
Calcium: 9.9 mg/dL (ref 8.0–10.3)
Chloride: 112 mmol/L — ABNORMAL HIGH (ref 98–108)
Creatinine: 1.2 mg/dL (ref 0.60–1.20)
Glucose, Bld: 90 mg/dL (ref 73–118)
POTASSIUM: 4.8 mmol/L — AB (ref 3.3–4.7)
Sodium: 143 mmol/L (ref 128–145)
TOTAL PROTEIN: 8.3 g/dL — AB (ref 6.4–8.1)

## 2017-10-08 NOTE — Progress Notes (Signed)
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 : I do not know why I am here.  HPI: [name] is a very charming 77 year old African-American female.  She is followed by Dr. Baird Cancer.  She has multiple health issues.  She is not sure at all as to what is going on with her outside of the fact that she has arthritis.  Apparently, Dr. Baird Cancer did do some lab work on her back in August.  She had an SPEP done.  This showed an M spike of 1.2 g/dL.  Going back through the records, [name] had seen a local oncologist back in 2012 with the same problem.  She actually had a bone marrow test done.  This was done in March.  The bone marrow report ([id number]) showed 2% plasma cells in a hypercellular bone marrow.  I cannot find any notes from her encounters with the oncologist.  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  She had an M spike of 1.05 g/dL.  Her IgG level was 2110 mg/dL.  She did have an elevated IgA level of 495 mg/dL.  Her lambda light chain was 3.95 mg/dL.  A year later, her M spike was 0.9 g/dL.  Her IgG level was 1710 mg/dL.  At that point, I guess that she did not see anybody else.  She does see multiple doctors.  She is on multiple medications.  She really cannot give me much history.   she still smokes.  She probably has a 50-pack-year history of tobacco use.  She has had multiple surgeries.  She is retired.  [place]  She has had some weight loss.  She says she is lost 50 pounds.  She just does not want to eat much.  She says that she does not like cooking.  She has had no rashes.  There is been no fever.  She has had no infections.  Of note, she had a bone survey back in 2012.  This showed 3 small lytic lesions in the skull.  A head CT scan done in July 2018 did not show any skull issues.  Overall, I will set her performance status is ECOG 3.    Past Medical History:   Diagnosis Date  . Acute kidney injury superimposed on chronic kidney disease ([facility]) 02/06/2015  . Anxiety   . Depression   . DVT (deep venous thrombosis) (H[facility]) 1970s   LLE  . Heart murmur   . Hypercholesterolemia   . Hypertension   . Kidney stones   . Migraine    "a few/year" (07/16/2015)  . Multiple falls   . Pneumonia "several times"  . Rheumatoid arthritis(714.0)    "all over" (07/16/2015)  . Seizure [facility])    "last one was 1//2017" (07/16/2015)  . Syncope and collapse   . Vision abnormalities   :  Past Surgical History:  Procedure Laterality Date  . ABDOMINAL HYSTERECTOMY    . CARPAL TUNNEL RELEASE Bilateral   . CATARACT EXTRACTION W/ INTRAOCULAR LENS  IMPLANT, BILATERAL Bilateral   . NECK EXPLORATION  07/16/2015  . PARATHYROIDECTOMY Right 07/16/2015   superior  . PARATHYROIDECTOMY Right 07/16/2015   Procedure: RIGHT INFERIOR PARATHYROIDECTOMY;  Surgeon: Armandina Gemma, MD;  Location: [facility];  Service: General;  Laterality: Right;  . TOENAIL AVULSION Bilateral    great toe  :   Current Outpatient Medications:  .  aspirin EC 81 MG tablet, Take 81 mg by mouth daily., Disp: , Rfl:  .  carvedilol (COREG) 6.25 MG tablet, Take 1 tablet (6.25 mg total) by mouth 2 (two) times daily with a meal., Disp: 60 tablet, Rfl: 0 .  HYDROcodone-acetaminophen (NORCO/VICODIN) 5-325 MG tablet, Take 1-2 tablets by mouth every 4 (four) hours as needed for moderate pain., Disp: , Rfl:  .  hydroxychloroquine (PLAQUENIL) 200 MG tablet, Take 200 mg by mouth daily. , Disp: , Rfl: 0 .  levETIRAcetam (KEPPRA) 500 MG tablet, Take 2 tablets (1,000 mg total) by mouth daily., Disp: 60 tablet, Rfl: 0 .  ondansetron (ZOFRAN) 4 MG tablet, Take 1 tablet (4 mg total) by mouth every 8 (eight) hours as needed for nausea or vomiting., Disp: 12 tablet, Rfl: 0 .  Vitamin D, Ergocalciferol, (DRISDOL) 50000 units CAPS capsule, Take 1 capsule (50,000 Units total) by mouth every 7 (seven) days., Disp: , Rfl: :  :  No Known  Allergies:  Family History  Problem Relation Age of Onset  . Cancer Father   . Prostate cancer Father   . Cancer Brother   . Heart disease Brother   . Miscarriages / [place] Brother   . Heart disease Mother   . Pneumonia Mother   :  Social History   Socioeconomic History  . Marital status: Married    Spouse name: Not on file  . Number of children: Not on file  . Years of education: Not on file  . Highest education level: Not on file  Occupational History  . Not on file  Social Needs  . Financial resource strain: Somewhat hard  . Food insecurity:    Worry: Never true    Inability: Never true  . Transportation needs:    Medical: Yes    Non-medical: No  Tobacco Use  . Smoking status: Current Every Day Smoker    Packs/day: 1.00    Years: 62.00    Pack years: 62.00    Types: Cigarettes  . Smokeless tobacco: Never Used  Substance and Sexual Activity  . Alcohol use: No  . Drug use: No  . Sexual activity: Never  Lifestyle  . Physical activity:    Days per week: Not on file    Minutes per session: Not on file  . Stress: Not on file  Relationships  . Social connections:    Talks on phone: Not on file    Gets together: Not on file    Attends religious service: Not on file    Active member of club or organization: Not on file    Attends meetings of clubs or organizations: Not on file    Relationship status: Not on file  . Intimate partner violence:    Fear of current or ex partner: Not on file    Emotionally abused: Not on file    Physically abused: Not on file    Forced sexual activity: Not on file  Other Topics Concern  . Not on file  Social History Narrative  . Not on file  :  Physical Exam  Constitutional: She is oriented to person, place, and time.  HENT:  Head: Normocephalic and atraumatic.  Mouth/Throat: Oropharynx is clear and moist.  Eyes: Pupils are equal, round, and reactive to light. EOM are normal.  Neck: Normal range of motion.   Cardiovascular: Normal rate, regular rhythm and normal heart sounds.  Pulmonary/Chest: Effort normal and breath sounds normal.  Abdominal: Soft. Bowel sounds are normal.  Musculoskeletal:  Normal range of motion. She exhibits no edema, tenderness or deformity.  Lymphadenopathy:    She has no cervical adenopathy.  Neurological: She is alert and oriented to person, place, and time.  Skin: Skin is warm and dry. No rash noted. No erythema.  Psychiatric: She has a normal mood and affect. Her behavior is normal. Judgment and thought content normal.  Vitals reviewed.    Review of Systems  Constitutional: Positive for weight loss.  HENT: Negative.   Eyes: Negative.   Respiratory: Negative.   Cardiovascular: Negative.   Gastrointestinal: Negative.   Genitourinary: Negative.   Musculoskeletal: Negative.   Skin: Negative.   Neurological: Negative.   Endo/Heme/Allergies: Negative.   Psychiatric/Behavioral: Negative.     @IPVITALS @   Recent Labs    10/08/17 1329  WBC 9.6  HGB 14.4  HCT 42.7  PLT 345   Recent Labs    10/08/17 1329  NA 143  K 4.8*  CL 112*  CO2 28  GLUCOSE 90  BUN 19  CREATININE 1.20  CALCIUM 9.9    Blood smear review: Normochromic and normocytic population of red blood cells.  There is no rouleaux formation.  There is no schistocytes or spherocytes.  She has no nucleated red blood cells.  White blood cells appear normal in morphology maturation.  She has no hypersegmented polys.  There is no immature myeloid or lymphoid forms.  I see no plasma cells.  Platelets are adequate number and size.  Pathology: None    Assessment and Plan: [name] is a very nice 77 year old African-American female.  She has a IgG lambda MGUS.  She has had this for at least 7 years.  She was diagnosed back in 2012 with this.  Her M spike might be a little bit worse.  However, if over 7-year is gone from 1.05 to 1.2 g/dL, this is a very slow progression.  I really do not think  that we have to do anything with this.  She does not have a good performance status.  She has other health issues.  I do not see need for a another bone marrow test.  I do not see need for any type of x-rays.  I just think she needs to be watched supportively.  I spent about 45 minutes with her.  All the time spent face-to-face with her.  I counseled her and helped arrange for follow-up.  I answered her questions.  I reassured her that I just do not think that she had a problem that we had to fix.  She was happy to hear this.  I will plan to see her back in 6 months.

## 2017-10-09 LAB — KAPPA/LAMBDA LIGHT CHAINS
Kappa free light chain: 37.3 mg/L — ABNORMAL HIGH (ref 3.3–19.4)
Kappa, lambda light chain ratio: 0.92 (ref 0.26–1.65)
LAMDA FREE LIGHT CHAINS: 40.7 mg/L — AB (ref 5.7–26.3)

## 2017-10-09 LAB — IGG, IGA, IGM
IGG (IMMUNOGLOBIN G), SERUM: 1883 mg/dL — AB (ref 700–1600)
IGM (IMMUNOGLOBULIN M), SRM: 217 mg/dL (ref 26–217)
IgA: 400 mg/dL (ref 64–422)

## 2017-10-10 LAB — PROTEIN ELECTROPHORESIS, SERUM, WITH REFLEX
A/G Ratio: 1 (ref 0.7–1.7)
Albumin ELP: 3.8 g/dL (ref 2.9–4.4)
Alpha-1-Globulin: 0.2 g/dL (ref 0.0–0.4)
Alpha-2-Globulin: 0.7 g/dL (ref 0.4–1.0)
Beta Globulin: 1.3 g/dL (ref 0.7–1.3)
GLOBULIN, TOTAL: 3.9 g/dL (ref 2.2–3.9)
Gamma Globulin: 1.6 g/dL (ref 0.4–1.8)
M-SPIKE, %: 1.3 g/dL — AB
SPEP Interpretation: 0
Total Protein ELP: 7.7 g/dL (ref 6.0–8.5)

## 2017-10-10 LAB — IMMUNOFIXATION REFLEX, SERUM
IGA: 408 mg/dL (ref 64–422)
IGG (IMMUNOGLOBIN G), SERUM: 1843 mg/dL — AB (ref 700–1600)
IgM (Immunoglobulin M), Srm: 225 mg/dL — ABNORMAL HIGH (ref 26–217)

## 2017-10-11 ENCOUNTER — Other Ambulatory Visit: Payer: Self-pay | Admitting: Oncology

## 2017-10-11 DIAGNOSIS — R634 Abnormal weight loss: Secondary | ICD-10-CM | POA: Diagnosis not present

## 2017-10-11 DIAGNOSIS — E7800 Pure hypercholesterolemia, unspecified: Secondary | ICD-10-CM | POA: Diagnosis not present

## 2017-10-11 DIAGNOSIS — D472 Monoclonal gammopathy: Secondary | ICD-10-CM

## 2017-10-11 DIAGNOSIS — N189 Chronic kidney disease, unspecified: Secondary | ICD-10-CM | POA: Diagnosis not present

## 2017-10-11 DIAGNOSIS — M069 Rheumatoid arthritis, unspecified: Secondary | ICD-10-CM | POA: Diagnosis not present

## 2017-10-11 DIAGNOSIS — M129 Arthropathy, unspecified: Secondary | ICD-10-CM | POA: Diagnosis not present

## 2017-10-11 DIAGNOSIS — F17210 Nicotine dependence, cigarettes, uncomplicated: Secondary | ICD-10-CM | POA: Diagnosis not present

## 2017-10-11 DIAGNOSIS — I129 Hypertensive chronic kidney disease with stage 1 through stage 4 chronic kidney disease, or unspecified chronic kidney disease: Secondary | ICD-10-CM | POA: Diagnosis not present

## 2017-10-11 DIAGNOSIS — F418 Other specified anxiety disorders: Secondary | ICD-10-CM | POA: Diagnosis not present

## 2017-10-15 ENCOUNTER — Ambulatory Visit: Payer: Self-pay

## 2017-10-15 DIAGNOSIS — Z6820 Body mass index (BMI) 20.0-20.9, adult: Secondary | ICD-10-CM | POA: Diagnosis not present

## 2017-10-15 DIAGNOSIS — I10 Essential (primary) hypertension: Secondary | ICD-10-CM | POA: Diagnosis not present

## 2017-10-15 LAB — UPEP/UIFE/LIGHT CHAINS/TP, 24-HR UR
% BETA, URINE: 0 %
ALPHA 1 URINE: 0 %
ALPHA 2 UR: 0 %
Albumin, U: 100 %
FREE KAPPA/LAMBDA RATIO: 15.75 — AB (ref 2.04–10.37)
FREE LAMBDA LT CHAINS, UR: 2.85 mg/L (ref 0.24–6.66)
Free Kappa Lt Chains,Ur: 44.9 mg/L — ABNORMAL HIGH (ref 1.35–24.19)
GAMMA GLOBULIN URINE: 0 %
TOTAL PROTEIN, URINE-UPE24: 7.6 mg/dL
TOTAL PROTEIN, URINE-UR/DAY: 68 mg/(24.h) (ref 30–150)
Total Volume: 900

## 2017-10-19 ENCOUNTER — Other Ambulatory Visit: Payer: Self-pay | Admitting: *Deleted

## 2017-10-19 ENCOUNTER — Encounter: Payer: Self-pay | Admitting: *Deleted

## 2017-10-19 NOTE — Patient Outreach (Signed)
[facility]) Care Management  10/19/2017  [name] 01-Jul-1940 [id number]  CSW was able to meet with patient today to perform the initial home visit.  Patient was eating lunch, provided to her by [employer] through [employer] [facility] [place], at the time of CSW's arrival.  Patient admits to having lost a great deal of weight due to loss of appetite.  Patient further reported that she does not trust herself to cook anymore because she forgets and leaves the stove and oven unattended.  In the evenings, patient stated that her husband, Kennah Hehr cooks for her or brings something home for them to eat, and that they always try to eat a well-balanced meal.  Patient's granddaughter, Willa Brocks and grandson, Alfonso Patten live nearby and check in with patient on a daily basis.  Mr. Slaght is home all day on Monday's and Sunday's, but works Tuesday through Saturday.  Patient reported that she tries to schedule all her physician appointments on Monday's so that Mr. Oltmann is able to transport her.  Otherwise, patient reported that she will make arrangements through her cousin, Kavin Leech or Mrs. Aundra Dubin, as they are always willing to transport her.    Patient indicated that the pain from arthritis in her hands is much better and that she is actually developing more strength in them.  Patient also admits to feeling a great deal of pain in her left arm, convinced that it was broken, until she recently had it x-rayed and found out otherwise.  Patient reported that it is still very painful to the touch and that she is barely able to lift it above her chest.  CSW encouraged patient to talk with her Primary Care Physician, Dr. Glendale Chard, concerned that there may be something else causing patient's pain.  Patient admits to sleeping a lot during the day, not really having much else to do to pass the time until Mr. Ullmer returns home.  Patient further admitted that she stays  in bed for most of the day because she is afraid that she will fall and no one will be in the home to help her up.  Patient has a significant history of falls, the last one being on Monday, October 15, 2017. Fortunately, patient was not hurt, nor was anything broken.  Patient has been encouraged to utilize her rolling walker to help ambulate with assistance, especially when leaving the home.  Patient reported that she has a Life Alert necklace that she wears at all times.  Patient also reported carrying her cell phone around with her, in the event of an emergency.  Per patient, she is currently unable to bath herself independently; therefore, Mrs. Aundra Dubin comes by the house on a daily basis to make sure that patient receives a bath, as well as gets dressed.  Mrs. Aundra Dubin also assists patient with setting up her pill box and calls in her prescription medications when they are ready to be filled.  CSW talked with patient about attending an Adult Day Program during the day, such as [facility] ([facility]) or [facility] ([facility]), to try and gain more activity and socialization, but patient denied interest.  Patient believes that it would be too much of a hassle to try and get to and from the centers, and expressed an overall disinterest in the programs.  Patient indicated that Mrs. Aundra Dubin is good about getting her out of the house and including her  in all the family gatherings.  Patient reported that she already has some durable medical equipment in the home, which includes a cane and walker.  After performing the initial home visit with patient, CSW recommended that patient also invest in the following a shower chair, raised toilet seat and hand held shower hose to help try and prevent falls when she is in the bathroom.  Patient indicated that she has the money to afford the additional pieces of equipment and that she will make arrangements to purchase these items within  the next two weeks.  CSW explained to patient that patient was referred to CSW because she scored a 21 on her PHQ-9 Depression Screening.  CSW inquired as to whether or not patient would be interested in receiving counseling and supportive services, through [facility] or another therapist of her choice, to help cope with her symptoms of depression?  Patient stated, "I believe I have been depressed all my life so I have learned to cope throughout the years.  Patient did not wish to discuss her symptoms of depression, nor was patient interested in receiving counseling services of any kind.  Patient reported having 5 brothers, but that they are all deceased now.  Patient also reports having one sister, but she rarely gets to see her because she lives in [place].  Patient admitted that she becomes depressed when she thinks about death and all the losses she has experienced over the years.  CSW spoke with patient about Grief and Loss Counseling services through [facility], but patient was not interested.  CSW was able to teach patient deep breathing exercises and relaxation techniques for when she begins to experience symptoms of depression.  CSW also provided patient with EMMI information regarding to "Signs and Symptoms of Depression" for patient's independent review.  CSW will perform a case closure on patient, as all goals of treatment have been met from social work standpoint and no additional social work needs have been identified at this time.  CSW will fax an update to patient's Primary Care Physician, Dr. Glendale Chard to ensure that they are aware of CSW's involvement with patient's plan of care.  Patient has CSW's contact information and has been encouraged to contact CSW if social work needs arise in the near future.  Patient voiced understanding and was agreeable to this plan.  [facility] CM Care Plan Problem One     Most Recent Value  Care Plan Problem One  Patient is  experiencing symptoms of depression.  Role Documenting the Problem One  Clinical Social Worker  Care Plan for Problem One  Active  [facility] Long Term Goal   Patient will have a reduction in symptoms of depression, through counseling and supportive services, within the next 45 days.  THN Long Term Goal Start Date  10/05/17  THN Long Term Goal Met Date  10/19/17  Interventions for Problem One Long Term Goal  Patient denies need for counseling and supportive services but agreed to practice relaxation techniques and deep breathing exercises to help cope with symptoms of depression.  [facility] CM Short Term Goal #1   Patient will meet with CSW for an intiail home visit to receive counseling and supportive services for symptoms of depression, within the next two weeks.  [facility] CM Short Term Goal #1 Start Date  10/05/17  [facility] CM Short Term Goal #1 Met Date  10/19/17  Interventions for Short Term Goal #1  CSW was able to meet with  patient today for the initial home visit to provide counseling and supportive services.  [facility] CM Short Term Goal #2   Patient will review EMMI information provided to her by CSW, pertaining specifically to "Sign and Symptoms of Depression", within the next 30 days.  [facility]N CM Short Term Goal #2 Start Date  10/05/17  [facility] CM Short Term Goal #2 Met Date  10/19/17  Interventions for Short Term Goal #2  Patient agreed to review EMMI information provided to her by CSW regarding depression.  [facility]N CM Short Term Goal #3  Patient will practice deep breathing exercises and relaxation techniques to help cope with symptoms of depression, within the next 30 days.  [facility]N CM Short Term Goal #3 Start Date  10/05/17  [facility] CM Short Term Goal #3 Met Date  10/19/17  Interventions for Short Tern Goal #3  Patient agreed to utilize deep breathing exercises and relaxation techniques when she experiences symptoms of depression.     Nat Christen, BSW, MSW, LCSW  Licensed Health visitor Health System  Mailing [place] [address] [place], [place] [address] Physical Address-[address], [place], [place] [address] Toll Free Main # [phone] Fax # [phone] Cell # [phone]  Office # [phone] [email or web address]

## 2018-02-16 DIAGNOSIS — K922 Gastrointestinal hemorrhage, unspecified: Secondary | ICD-10-CM

## 2018-02-16 HISTORY — DX: Gastrointestinal hemorrhage, unspecified: K92.2

## 2018-02-27 ENCOUNTER — Encounter (HOSPITAL_COMMUNITY): Payer: Self-pay | Admitting: Emergency Medicine

## 2018-02-27 ENCOUNTER — Emergency Department (HOSPITAL_COMMUNITY): Payer: Medicare PPO

## 2018-02-27 ENCOUNTER — Other Ambulatory Visit: Payer: Self-pay

## 2018-02-27 ENCOUNTER — Inpatient Hospital Stay (HOSPITAL_COMMUNITY)
Admission: EM | Admit: 2018-02-27 | Discharge: 2018-03-08 | DRG: 246 | Disposition: A | Discharge status: Home / Self Care | Payer: Medicare PPO | Attending: Interventional Cardiology | Admitting: Interventional Cardiology

## 2018-02-27 DIAGNOSIS — I97630 Postprocedural hematoma of a circulatory system organ or structure following a cardiac catheterization: Secondary | ICD-10-CM | POA: Diagnosis not present

## 2018-02-27 DIAGNOSIS — D500 Iron deficiency anemia secondary to blood loss (chronic): Secondary | ICD-10-CM | POA: Diagnosis not present

## 2018-02-27 DIAGNOSIS — K31819 Angiodysplasia of stomach and duodenum without bleeding: Secondary | ICD-10-CM

## 2018-02-27 DIAGNOSIS — I959 Hypotension, unspecified: Secondary | ICD-10-CM | POA: Diagnosis not present

## 2018-02-27 DIAGNOSIS — E876 Hypokalemia: Secondary | ICD-10-CM | POA: Diagnosis present

## 2018-02-27 DIAGNOSIS — R0689 Other abnormalities of breathing: Secondary | ICD-10-CM | POA: Diagnosis not present

## 2018-02-27 DIAGNOSIS — G40909 Epilepsy, unspecified, not intractable, without status epilepticus: Secondary | ICD-10-CM | POA: Diagnosis present

## 2018-02-27 DIAGNOSIS — E892 Postprocedural hypoparathyroidism: Secondary | ICD-10-CM | POA: Diagnosis present

## 2018-02-27 DIAGNOSIS — R402310 Coma scale, best motor response, none, unspecified time: Secondary | ICD-10-CM | POA: Diagnosis present

## 2018-02-27 DIAGNOSIS — R195 Other fecal abnormalities: Secondary | ICD-10-CM

## 2018-02-27 DIAGNOSIS — Z90710 Acquired absence of both cervix and uterus: Secondary | ICD-10-CM

## 2018-02-27 DIAGNOSIS — G9340 Encephalopathy, unspecified: Secondary | ICD-10-CM | POA: Diagnosis not present

## 2018-02-27 DIAGNOSIS — Z4682 Encounter for fitting and adjustment of non-vascular catheter: Secondary | ICD-10-CM | POA: Diagnosis not present

## 2018-02-27 DIAGNOSIS — F329 Major depressive disorder, single episode, unspecified: Secondary | ICD-10-CM | POA: Diagnosis present

## 2018-02-27 DIAGNOSIS — I129 Hypertensive chronic kidney disease with stage 1 through stage 4 chronic kidney disease, or unspecified chronic kidney disease: Secondary | ICD-10-CM | POA: Diagnosis present

## 2018-02-27 DIAGNOSIS — Z9049 Acquired absence of other specified parts of digestive tract: Secondary | ICD-10-CM

## 2018-02-27 DIAGNOSIS — R402110 Coma scale, eyes open, never, unspecified time: Secondary | ICD-10-CM | POA: Diagnosis present

## 2018-02-27 DIAGNOSIS — I213 ST elevation (STEMI) myocardial infarction of unspecified site: Secondary | ICD-10-CM | POA: Diagnosis present

## 2018-02-27 DIAGNOSIS — R404 Transient alteration of awareness: Secondary | ICD-10-CM | POA: Diagnosis not present

## 2018-02-27 DIAGNOSIS — J9601 Acute respiratory failure with hypoxia: Secondary | ICD-10-CM | POA: Diagnosis not present

## 2018-02-27 DIAGNOSIS — R402210 Coma scale, best verbal response, none, unspecified time: Secondary | ICD-10-CM | POA: Diagnosis present

## 2018-02-27 DIAGNOSIS — K31811 Angiodysplasia of stomach and duodenum with bleeding: Secondary | ICD-10-CM | POA: Diagnosis not present

## 2018-02-27 DIAGNOSIS — D631 Anemia in chronic kidney disease: Secondary | ICD-10-CM | POA: Diagnosis present

## 2018-02-27 DIAGNOSIS — Z7902 Long term (current) use of antithrombotics/antiplatelets: Secondary | ICD-10-CM | POA: Diagnosis not present

## 2018-02-27 DIAGNOSIS — D62 Acute posthemorrhagic anemia: Secondary | ICD-10-CM | POA: Diagnosis not present

## 2018-02-27 DIAGNOSIS — Z0189 Encounter for other specified special examinations: Secondary | ICD-10-CM

## 2018-02-27 DIAGNOSIS — R9431 Abnormal electrocardiogram [ECG] [EKG]: Secondary | ICD-10-CM | POA: Diagnosis not present

## 2018-02-27 DIAGNOSIS — I10 Essential (primary) hypertension: Secondary | ICD-10-CM | POA: Diagnosis not present

## 2018-02-27 DIAGNOSIS — F17210 Nicotine dependence, cigarettes, uncomplicated: Secondary | ICD-10-CM | POA: Diagnosis present

## 2018-02-27 DIAGNOSIS — E785 Hyperlipidemia, unspecified: Secondary | ICD-10-CM | POA: Diagnosis present

## 2018-02-27 DIAGNOSIS — D72829 Elevated white blood cell count, unspecified: Secondary | ICD-10-CM | POA: Diagnosis not present

## 2018-02-27 DIAGNOSIS — R092 Respiratory arrest: Secondary | ICD-10-CM

## 2018-02-27 DIAGNOSIS — F419 Anxiety disorder, unspecified: Secondary | ICD-10-CM | POA: Diagnosis present

## 2018-02-27 DIAGNOSIS — N184 Chronic kidney disease, stage 4 (severe): Secondary | ICD-10-CM | POA: Diagnosis present

## 2018-02-27 DIAGNOSIS — I499 Cardiac arrhythmia, unspecified: Secondary | ICD-10-CM | POA: Diagnosis not present

## 2018-02-27 DIAGNOSIS — J9690 Respiratory failure, unspecified, unspecified whether with hypoxia or hypercapnia: Secondary | ICD-10-CM | POA: Diagnosis not present

## 2018-02-27 DIAGNOSIS — I2510 Atherosclerotic heart disease of native coronary artery without angina pectoris: Secondary | ICD-10-CM | POA: Diagnosis present

## 2018-02-27 DIAGNOSIS — N179 Acute kidney failure, unspecified: Secondary | ICD-10-CM | POA: Diagnosis not present

## 2018-02-27 DIAGNOSIS — E870 Hyperosmolality and hypernatremia: Secondary | ICD-10-CM | POA: Diagnosis not present

## 2018-02-27 DIAGNOSIS — I080 Rheumatic disorders of both mitral and aortic valves: Secondary | ICD-10-CM | POA: Diagnosis present

## 2018-02-27 DIAGNOSIS — K921 Melena: Secondary | ICD-10-CM | POA: Diagnosis not present

## 2018-02-27 DIAGNOSIS — I2119 ST elevation (STEMI) myocardial infarction involving other coronary artery of inferior wall: Secondary | ICD-10-CM | POA: Diagnosis present

## 2018-02-27 DIAGNOSIS — W19XXXA Unspecified fall, initial encounter: Secondary | ICD-10-CM | POA: Diagnosis present

## 2018-02-27 DIAGNOSIS — R042 Hemoptysis: Secondary | ICD-10-CM | POA: Diagnosis not present

## 2018-02-27 DIAGNOSIS — Z79899 Other long term (current) drug therapy: Secondary | ICD-10-CM

## 2018-02-27 DIAGNOSIS — I2111 ST elevation (STEMI) myocardial infarction involving right coronary artery: Secondary | ICD-10-CM | POA: Diagnosis not present

## 2018-02-27 DIAGNOSIS — R000 Tachycardia, unspecified: Secondary | ICD-10-CM | POA: Diagnosis present

## 2018-02-27 DIAGNOSIS — I361 Nonrheumatic tricuspid (valve) insufficiency: Secondary | ICD-10-CM | POA: Diagnosis not present

## 2018-02-27 DIAGNOSIS — Z7982 Long term (current) use of aspirin: Secondary | ICD-10-CM

## 2018-02-27 DIAGNOSIS — Z86718 Personal history of other venous thrombosis and embolism: Secondary | ICD-10-CM

## 2018-02-27 DIAGNOSIS — R296 Repeated falls: Secondary | ICD-10-CM | POA: Diagnosis present

## 2018-02-27 DIAGNOSIS — K3189 Other diseases of stomach and duodenum: Secondary | ICD-10-CM | POA: Diagnosis not present

## 2018-02-27 DIAGNOSIS — Z8249 Family history of ischemic heart disease and other diseases of the circulatory system: Secondary | ICD-10-CM

## 2018-02-27 DIAGNOSIS — I9589 Other hypotension: Secondary | ICD-10-CM | POA: Diagnosis present

## 2018-02-27 DIAGNOSIS — M069 Rheumatoid arthritis, unspecified: Secondary | ICD-10-CM | POA: Diagnosis present

## 2018-02-27 MED ORDER — FENTANYL 2500MCG IN NS 250ML (10MCG/ML) PREMIX INFUSION
0.0000 ug/h | INTRAVENOUS | Status: DC
  Administered 2018-02-28: 25 ug/h via INTRAVENOUS
  Filled 2018-02-27: qty 250

## 2018-02-27 MED ORDER — ASPIRIN 300 MG RE SUPP
300.0000 mg | Freq: Once | RECTAL | Status: AC
  Administered 2018-02-28: 300 mg via RECTAL

## 2018-02-27 MED ORDER — HEPARIN SODIUM (PORCINE) 5000 UNIT/ML IJ SOLN
4000.0000 [IU] | Freq: Once | INTRAMUSCULAR | Status: AC
  Administered 2018-02-27: 4000 [IU] via INTRAVENOUS

## 2018-02-27 MED ORDER — NALOXONE HCL 0.4 MG/ML IJ SOLN
INTRAMUSCULAR | Status: AC
  Administered 2018-02-27
  Filled 2018-02-27: qty 1

## 2018-02-27 MED ORDER — ASPIRIN 300 MG RE SUPP
RECTAL | Status: AC
  Filled 2018-02-27: qty 1

## 2018-02-27 MED ORDER — HEPARIN SODIUM (PORCINE) 5000 UNIT/ML IJ SOLN
INTRAMUSCULAR | Status: AC
  Filled 2018-02-27: qty 1

## 2018-02-27 MED ORDER — NOREPINEPHRINE 4 MG/250ML-% IV SOLN: 0.0000 ug/min | INTRAVENOUS | Status: DC

## 2018-02-27 MED ORDER — SODIUM CHLORIDE 0.9 % IV BOLUS
1000.0000 mL | Freq: Once | INTRAVENOUS | Status: AC
  Administered 2018-02-27: 1000 mL via INTRAVENOUS

## 2018-02-27 NOTE — ED Provider Notes (Signed)
[facility] EMERGENCY DEPARTMENT Provider Note   CSN: [id number] Arrival date & time: 02/27/18  2337     History   Chief Complaint Chief Complaint  Patient presents with  . Code STEMI    HPI [name] is a 78 y.o. female.  The history is provided by the EMS personnel. The history is limited by the condition of the patient (unresponsive).  Loss of Consciousness  Episode history:  Single Most recent episode:  Today Timing:  Constant Progression:  Unchanged Witnessed: yes   Relieved by:  Nothing Worsened by:  Nothing Associated symptoms: vomiting   Associated symptoms: no chest pain, no fever and no seizures   Associated symptoms comment:  Unresponsive Risk factors: no congenital heart disease     Past Medical History:  Diagnosis Date  . Acute kidney injury superimposed on chronic kidney disease ([place]) 02/06/2015  . Anxiety   . Depression   . DVT (deep venous thrombosis) (HCC) 1970s   LLE  . Heart murmur   . Hypercholesterolemia   . Hypertension   . Kidney stones   . Migraine    "a few/year" (07/16/2015)  . Multiple falls   . Pneumonia "several times"  . Rheumatoid arthritis(714.0)    "all over" (07/16/2015)  . Seizure [facility])    "last one was 1//2017" (07/16/2015)  . Syncope and collapse   . Vision abnormalities     Patient Active Problem List   Diagnosis Date Noted  . SIRS (systemic inflammatory response syndrome) ([place]) 08/11/2016  . Nausea vomiting and diarrhea 08/11/2016  . Renal failure (ARF), acute on chronic (HCC) 08/11/2016  . High risk medications (not anticoagulants) long-term use 04/20/2016  . Bilateral chronic knee pain 04/20/2016  . Pain in left elbow 04/20/2016  . Chronic kidney disease, stage III (moderate) ([place]) 08/03/2015  . Encounter for general adult medical examination without abnormal findings 08/03/2015  . Hypertensive chronic kidney disease with stage 1 through stage 4 chronic kidney disease, or unspecified  chronic kidney disease 08/03/2015  . Other long term (current) drug therapy 08/03/2015  . Pain in right shoulder 08/03/2015  . Postmenopausal bleeding 08/03/2015  . Vitamin D deficiency 08/03/2015  . Localized pain of right shoulder joint 08/03/2015  . Primary hyperparathyroidism ([place]) 07/16/2015  . Hyperparathyroidism, primary ([place]) 07/14/2015  . Gait disturbance 03/24/2015  . Neck pain 03/24/2015  . Myalgia and myositis 03/24/2015  . Acute kidney injury superimposed on chronic kidney disease ([place]) 02/06/2015  . Leukocytosis 02/06/2015  . Seizure ([place]) 02/05/2015  . Acute encephalopathy 02/05/2015  . Abnormal MRI of head 02/02/2014  . Cognitive changes 02/02/2014  . Migraine without aura and without status migrainosus, not intractable 02/02/2014  . LOC (loss of consciousness) ([place])   . Syncope 01/12/2014  . Syncope and collapse 01/12/2014  . Hypertensive urgency 01/12/2014  . Rheumatoid arthritis ([place]) 01/12/2014  . Depression 01/12/2014  . Hyperlipidemia 01/12/2014    Past Surgical History:  Procedure Laterality Date  . ABDOMINAL HYSTERECTOMY    . CARPAL TUNNEL RELEASE Bilateral   . CATARACT EXTRACTION W/ INTRAOCULAR LENS  IMPLANT, BILATERAL Bilateral   . NECK EXPLORATION  07/16/2015  . PARATHYROIDECTOMY Right 07/16/2015   superior  . PARATHYROIDECTOMY Right 07/16/2015   Procedure: RIGHT INFERIOR PARATHYROIDECTOMY;  Surgeon: Armandina Gemma, MD;  Location: [facility];  Service: General;  Laterality: Right;  . TOENAIL AVULSION Bilateral    great toe     OB History   No obstetric history on file.  Home Medications    Prior to Admission medications   Medication Sig Start Date End Date Taking? Authorizing Provider  aspirin EC 81 MG tablet Take 81 mg by mouth daily.    [provider]  carvedilol (COREG) 6.25 MG tablet Take 1 tablet (6.25 mg total) by mouth 2 (two) times daily with a meal. 09/23/16   Julianne Rice, MD  HYDROcodone-acetaminophen (NORCO/VICODIN)  5-325 MG tablet Take 1-2 tablets by mouth every 4 (four) hours as needed for moderate pain.    [provider]  hydroxychloroquine (PLAQUENIL) 200 MG tablet Take 200 mg by mouth daily.  08/02/15   [provider]  levETIRAcetam (KEPPRA) 500 MG tablet Take 2 tablets (1,000 mg total) by mouth daily. 09/23/16   Julianne Rice, MD  ondansetron (ZOFRAN) 4 MG tablet Take 1 tablet (4 mg total) by mouth every 8 (eight) hours as needed for nausea or vomiting. 09/23/16   Julianne Rice, MD  Vitamin D, Ergocalciferol, (DRISDOL) 50000 units CAPS capsule Take 1 capsule (50,000 Units total) by mouth every 7 (seven) days. 08/14/16   Hongalgi, Lenis Dickinson, MD    Family History Family History  Problem Relation Age of Onset  . Cancer Father   . Prostate cancer Father   . Cancer Brother   . Heart disease Brother   . Miscarriages / [place] Brother   . Heart disease Mother   . Pneumonia Mother     Social History Social History   Tobacco Use  . Smoking status: Current Every Day Smoker    Packs/day: 1.00    Years: 62.00    Pack years: 62.00    Types: Cigarettes  . Smokeless tobacco: Never Used  Substance Use Topics  . Alcohol use: No  . Drug use: No     Allergies   Patient has no known allergies.   Review of Systems Review of Systems  Unable to perform ROS: Patient unresponsive  Constitutional: Negative for fever.  Cardiovascular: Positive for syncope. Negative for chest pain.  Gastrointestinal: Positive for vomiting.  Neurological: Positive for syncope. Negative for seizures.     Physical Exam Updated Vital Signs There were no vitals taken for this visit.  Physical Exam Vitals signs and nursing note reviewed.  Constitutional:      Appearance: She is normal weight.  HENT:     Head: Normocephalic and atraumatic.     Right Ear: Tympanic membrane normal.     Left Ear: Tympanic membrane normal.     Nose: Nose normal.     Mouth/Throat:     Comments: Vomitus in  airway Eyes:     Conjunctiva/sclera: Conjunctivae normal.  Neck:     Trachea: Trachea normal.  Cardiovascular:     Rate and Rhythm: Normal rate and regular rhythm.     Heart sounds: Normal heart sounds.  Pulmonary:     Effort: Bradypnea present.  Abdominal:     General: Abdomen is flat. Bowel sounds are normal.     Tenderness: There is no abdominal tenderness.  Musculoskeletal:     Right lower leg: No edema.     Left lower leg: No edema.  Skin:    General: Skin is warm and dry.     Capillary Refill: Capillary refill takes less than 2 seconds.     Findings: No bruising.  Neurological:     GCS: GCS eye subscore is 1. GCS verbal subscore is 1. GCS motor subscore is 1.  Psychiatric:     Comments: unable  ED Treatments / Results  Labs (all labs ordered are listed, but only abnormal results are displayed) Labs Reviewed  CBC WITH DIFFERENTIAL/PLATELET  BASIC METABOLIC PANEL  PROTIME-INR  I-STAT TROPONIN, ED  TYPE AND SCREEN    EKG  EKG Interpretation  Date/Time:  Thursday February 28 2018 00:00:20 EST Ventricular Rate:  87 PR Interval:  144 QRS Duration: 92 QT Interval:  418 QTC Calculation: 502 R Axis:   61 Text Interpretation:  Normal sinus rhythm Left ventricular hypertrophy with repolarization abnormality ST elevation consider inferior injury or acute infarct Prolonged QT ** ** ACUTE MI / STEMI ** ** Consider right ventricular involvement in acute inferior infarct Confirmed by Randal Buba, Lorain Fettes ([id number]) on 02/28/2018 12:03:33 AM       Radiology ETT in good position by me Procedures Procedure Name: Intubation Date/Time: 02/28/2018 12:02 AM Performed by: Veatrice Kells, MD Pre-anesthesia Checklist: Patient identified Oxygen Delivery Method: Ambu bag Preoxygenation: Pre-oxygenation with 100% oxygen Induction Type: Rapid sequence Ventilation: Mask ventilation without difficulty Laryngoscope Size: Glidescope and 3 Grade View: Grade I Tube size: 7.5 mm Number  of attempts: 1 Airway Equipment and Method: Patient positioned with wedge pillow Placement Confirmation: ETT inserted through vocal cords under direct vision,  Positive ETCO2 and Breath sounds checked- equal and bilateral Tube secured with: ETT holder Dental Injury: Teeth and Oropharynx as per pre-operative assessment  Difficulty Due To: Difficulty was anticipated      (including critical care time)  Medications Ordered in ED Medications  naloxone (NARCAN) 0.4 MG/ML injection (has no administration in time range)  norepinephrine (LEVOPHED) 4mg  in 235mL premix infusion (has no administration in time range)  sodium chloride 0.9 % bolus 1,000 mL (has no administration in time range)  aspirin suppository 300 mg (has no administration in time range)  fentaNYL 252mcg in NS 248mL (76mcg/ml) infusion-PREMIX (has no administration in time range)  heparin injection 4,000 Units (4,000 Units Intravenous Given 02/27/18 2351)     MDM Reviewed: nursing note and vitals Interpretation: labs, ECG and x-ray (no PNA by me on CXR) Total time providing critical care: 30-74 minutes. This excludes time spent performing separately reportable procedures and services. Consults: cardiology   CRITICAL CARE Performed by: Shifa Brisbon K Clairissa Valvano-Rasch Total critical care time: 31 minutes Critical care time was exclusive of separately billable procedures and treating other patients. Critical care was necessary to treat or prevent imminent or life-threatening deterioration. Critical care was time spent personally by me on the following activities: development of treatment plan with patient and/or surrogate as well as nursing, discussions with consultants, evaluation of patient's response to treatment, examination of patient, obtaining history from patient or surrogate, ordering and performing treatments and interventions, ordering and review of laboratory studies, ordering and review of radiographic studies, pulse  oximetry and re-evaluation of patient's condition.  Final Clinical Impressions(s) / ED Diagnoses   Final diagnoses:  ST elevation myocardial infarction (STEMI), unspecified artery [facility])  Respiratory arrest ([place])    Admit to cath lab   Lequan Dobratz, MD 02/28/18 5009

## 2018-02-27 NOTE — ED Triage Notes (Signed)
Patient was at home in normal health and fell and vomited x1.  Family moved back to chair, upon EMS arrival patient was following commands and lethargic.  Initial BP of 86/52 with HR of 50.  Patient started having shallow respirations and EMS started bagging patient en route to ED.  Patient is unresponsive upon arrival to ED.

## 2018-02-28 ENCOUNTER — Other Ambulatory Visit: Payer: Self-pay

## 2018-02-28 ENCOUNTER — Inpatient Hospital Stay (HOSPITAL_COMMUNITY)
Admission: EM | Disposition: A | Discharge status: Home / Self Care | Payer: Self-pay | Source: Home / Self Care | Attending: Interventional Cardiology

## 2018-02-28 ENCOUNTER — Inpatient Hospital Stay (HOSPITAL_COMMUNITY): Payer: Medicare PPO

## 2018-02-28 ENCOUNTER — Encounter (HOSPITAL_COMMUNITY): Payer: Self-pay | Admitting: Interventional Cardiology

## 2018-02-28 DIAGNOSIS — E876 Hypokalemia: Secondary | ICD-10-CM | POA: Diagnosis present

## 2018-02-28 DIAGNOSIS — R402110 Coma scale, eyes open, never, unspecified time: Secondary | ICD-10-CM | POA: Diagnosis present

## 2018-02-28 DIAGNOSIS — I10 Essential (primary) hypertension: Secondary | ICD-10-CM

## 2018-02-28 DIAGNOSIS — I2510 Atherosclerotic heart disease of native coronary artery without angina pectoris: Secondary | ICD-10-CM | POA: Diagnosis not present

## 2018-02-28 DIAGNOSIS — I9589 Other hypotension: Secondary | ICD-10-CM | POA: Diagnosis present

## 2018-02-28 DIAGNOSIS — F17210 Nicotine dependence, cigarettes, uncomplicated: Secondary | ICD-10-CM | POA: Diagnosis present

## 2018-02-28 DIAGNOSIS — N184 Chronic kidney disease, stage 4 (severe): Secondary | ICD-10-CM | POA: Diagnosis present

## 2018-02-28 DIAGNOSIS — J9690 Respiratory failure, unspecified, unspecified whether with hypoxia or hypercapnia: Secondary | ICD-10-CM

## 2018-02-28 DIAGNOSIS — R402310 Coma scale, best motor response, none, unspecified time: Secondary | ICD-10-CM | POA: Diagnosis present

## 2018-02-28 DIAGNOSIS — D72829 Elevated white blood cell count, unspecified: Secondary | ICD-10-CM | POA: Diagnosis not present

## 2018-02-28 DIAGNOSIS — J9601 Acute respiratory failure with hypoxia: Secondary | ICD-10-CM

## 2018-02-28 DIAGNOSIS — I2119 ST elevation (STEMI) myocardial infarction involving other coronary artery of inferior wall: Principal | ICD-10-CM

## 2018-02-28 DIAGNOSIS — R000 Tachycardia, unspecified: Secondary | ICD-10-CM | POA: Diagnosis present

## 2018-02-28 DIAGNOSIS — R402210 Coma scale, best verbal response, none, unspecified time: Secondary | ICD-10-CM | POA: Diagnosis present

## 2018-02-28 DIAGNOSIS — I080 Rheumatic disorders of both mitral and aortic valves: Secondary | ICD-10-CM | POA: Diagnosis present

## 2018-02-28 DIAGNOSIS — F419 Anxiety disorder, unspecified: Secondary | ICD-10-CM | POA: Diagnosis present

## 2018-02-28 DIAGNOSIS — K921 Melena: Secondary | ICD-10-CM | POA: Diagnosis not present

## 2018-02-28 DIAGNOSIS — K31811 Angiodysplasia of stomach and duodenum with bleeding: Secondary | ICD-10-CM | POA: Diagnosis not present

## 2018-02-28 DIAGNOSIS — I2111 ST elevation (STEMI) myocardial infarction involving right coronary artery: Secondary | ICD-10-CM | POA: Diagnosis not present

## 2018-02-28 DIAGNOSIS — I129 Hypertensive chronic kidney disease with stage 1 through stage 4 chronic kidney disease, or unspecified chronic kidney disease: Secondary | ICD-10-CM | POA: Diagnosis present

## 2018-02-28 DIAGNOSIS — G9340 Encephalopathy, unspecified: Secondary | ICD-10-CM | POA: Diagnosis not present

## 2018-02-28 DIAGNOSIS — I361 Nonrheumatic tricuspid (valve) insufficiency: Secondary | ICD-10-CM | POA: Diagnosis not present

## 2018-02-28 DIAGNOSIS — G40909 Epilepsy, unspecified, not intractable, without status epilepticus: Secondary | ICD-10-CM | POA: Diagnosis present

## 2018-02-28 DIAGNOSIS — N179 Acute kidney failure, unspecified: Secondary | ICD-10-CM | POA: Diagnosis not present

## 2018-02-28 DIAGNOSIS — K31819 Angiodysplasia of stomach and duodenum without bleeding: Secondary | ICD-10-CM | POA: Diagnosis not present

## 2018-02-28 DIAGNOSIS — E870 Hyperosmolality and hypernatremia: Secondary | ICD-10-CM | POA: Diagnosis not present

## 2018-02-28 DIAGNOSIS — D631 Anemia in chronic kidney disease: Secondary | ICD-10-CM | POA: Diagnosis present

## 2018-02-28 DIAGNOSIS — F329 Major depressive disorder, single episode, unspecified: Secondary | ICD-10-CM | POA: Diagnosis present

## 2018-02-28 DIAGNOSIS — D500 Iron deficiency anemia secondary to blood loss (chronic): Secondary | ICD-10-CM | POA: Diagnosis not present

## 2018-02-28 DIAGNOSIS — R296 Repeated falls: Secondary | ICD-10-CM | POA: Diagnosis present

## 2018-02-28 DIAGNOSIS — I213 ST elevation (STEMI) myocardial infarction of unspecified site: Secondary | ICD-10-CM | POA: Diagnosis present

## 2018-02-28 DIAGNOSIS — W19XXXA Unspecified fall, initial encounter: Secondary | ICD-10-CM | POA: Diagnosis present

## 2018-02-28 DIAGNOSIS — R092 Respiratory arrest: Secondary | ICD-10-CM | POA: Diagnosis present

## 2018-02-28 DIAGNOSIS — Z7902 Long term (current) use of antithrombotics/antiplatelets: Secondary | ICD-10-CM | POA: Diagnosis not present

## 2018-02-28 DIAGNOSIS — R0689 Other abnormalities of breathing: Secondary | ICD-10-CM | POA: Diagnosis not present

## 2018-02-28 DIAGNOSIS — R195 Other fecal abnormalities: Secondary | ICD-10-CM | POA: Diagnosis not present

## 2018-02-28 DIAGNOSIS — I97630 Postprocedural hematoma of a circulatory system organ or structure following a cardiac catheterization: Secondary | ICD-10-CM | POA: Diagnosis not present

## 2018-02-28 DIAGNOSIS — D62 Acute posthemorrhagic anemia: Secondary | ICD-10-CM | POA: Diagnosis not present

## 2018-02-28 HISTORY — DX: Atherosclerotic heart disease of native coronary artery without angina pectoris: I25.10

## 2018-02-28 HISTORY — PX: LEFT HEART CATH AND CORONARY ANGIOGRAPHY: CATH118249

## 2018-02-28 HISTORY — PX: CORONARY/GRAFT ACUTE MI REVASCULARIZATION: CATH118305

## 2018-02-28 LAB — POCT I-STAT 7, (LYTES, BLD GAS, ICA,H+H)
Acid-base deficit: 5 mmol/L — ABNORMAL HIGH (ref 0.0–2.0)
Acid-base deficit: 7 mmol/L — ABNORMAL HIGH (ref 0.0–2.0)
Acid-base deficit: 8 mmol/L — ABNORMAL HIGH (ref 0.0–2.0)
BICARBONATE: 20 mmol/L (ref 20.0–28.0)
Bicarbonate: 16.6 mmol/L — ABNORMAL LOW (ref 20.0–28.0)
Bicarbonate: 18.8 mmol/L — ABNORMAL LOW (ref 20.0–28.0)
Calcium, Ion: 1.08 mmol/L — ABNORMAL LOW (ref 1.15–1.40)
Calcium, Ion: 1.12 mmol/L — ABNORMAL LOW (ref 1.15–1.40)
Calcium, Ion: 1.2 mmol/L (ref 1.15–1.40)
HCT: 27 % — ABNORMAL LOW (ref 36.0–46.0)
HCT: 33 % — ABNORMAL LOW (ref 36.0–46.0)
HCT: 34 % — ABNORMAL LOW (ref 36.0–46.0)
HEMOGLOBIN: 11.6 g/dL — AB (ref 12.0–15.0)
Hemoglobin: 11.2 g/dL — ABNORMAL LOW (ref 12.0–15.0)
Hemoglobin: 9.2 g/dL — ABNORMAL LOW (ref 12.0–15.0)
O2 Saturation: 100 %
O2 Saturation: 100 %
O2 Saturation: 99 %
PH ART: 7.311 — AB (ref 7.350–7.450)
Patient temperature: 34.5
Patient temperature: 98.6
Potassium: 2.6 mmol/L — CL (ref 3.5–5.1)
Potassium: 2.9 mmol/L — ABNORMAL LOW (ref 3.5–5.1)
Potassium: 3.5 mmol/L (ref 3.5–5.1)
SODIUM: 147 mmol/L — AB (ref 135–145)
Sodium: 143 mmol/L (ref 135–145)
Sodium: 147 mmol/L — ABNORMAL HIGH (ref 135–145)
TCO2: 17 mmol/L — ABNORMAL LOW (ref 22–32)
TCO2: 20 mmol/L — ABNORMAL LOW (ref 22–32)
TCO2: 21 mmol/L — ABNORMAL LOW (ref 22–32)
pCO2 arterial: 25.8 mmHg — ABNORMAL LOW (ref 32.0–48.0)
pCO2 arterial: 35.9 mmHg (ref 32.0–48.0)
pCO2 arterial: 37.2 mmHg (ref 32.0–48.0)
pH, Arterial: 7.405 (ref 7.350–7.450)
pH, Arterial: 7.353 (ref 7.350–7.450)
pO2, Arterial: 135 mmHg — ABNORMAL HIGH (ref 83.0–108.0)
pO2, Arterial: 435 mmHg — ABNORMAL HIGH (ref 83.0–108.0)
pO2, Arterial: 508 mmHg — ABNORMAL HIGH (ref 83.0–108.0)

## 2018-02-28 LAB — CBC WITH DIFFERENTIAL/PLATELET
ABS IMMATURE GRANULOCYTES: 0.09 10*3/uL — AB (ref 0.00–0.07)
Basophils Absolute: 0.1 10*3/uL (ref 0.0–0.1)
Basophils Relative: 1 %
Eosinophils Absolute: 0.1 10*3/uL (ref 0.0–0.5)
Eosinophils Relative: 1 %
HCT: 38.8 % (ref 36.0–46.0)
Hemoglobin: 12.5 g/dL (ref 12.0–15.0)
IMMATURE GRANULOCYTES: 1 %
Lymphocytes Relative: 24 %
Lymphs Abs: 2.8 10*3/uL (ref 0.7–4.0)
MCH: 27.5 pg (ref 26.0–34.0)
MCHC: 32.2 g/dL (ref 30.0–36.0)
MCV: 85.3 fL (ref 80.0–100.0)
Monocytes Absolute: 1 10*3/uL (ref 0.1–1.0)
Monocytes Relative: 8 %
NEUTROS ABS: 7.8 10*3/uL — AB (ref 1.7–7.7)
NEUTROS PCT: 65 %
Platelets: 300 10*3/uL (ref 150–400)
RBC: 4.55 MIL/uL (ref 3.87–5.11)
RDW: 15.8 % — ABNORMAL HIGH (ref 11.5–15.5)
WBC: 11.8 10*3/uL — ABNORMAL HIGH (ref 4.0–10.5)
nRBC: 0 % (ref 0.0–0.2)

## 2018-02-28 LAB — LIPID PANEL
Cholesterol: 168 mg/dL (ref 0–200)
HDL: 42 mg/dL (ref >40)
LDL Cholesterol: 120 mg/dL — ABNORMAL HIGH (ref 0–99)
Total CHOL/HDL Ratio: 4 RATIO
Triglycerides: 31 mg/dL (ref <150)
VLDL: 6 mg/dL (ref 0–40)

## 2018-02-28 LAB — PROTIME-INR
INR: 1.08
Prothrombin Time: 13.9 seconds (ref 11.4–15.2)

## 2018-02-28 LAB — I-STAT TROPONIN, ED: Troponin i, poc: 0.01 ng/mL (ref 0.00–0.08)

## 2018-02-28 LAB — BASIC METABOLIC PANEL
ANION GAP: 12 (ref 5–15)
ANION GAP: 9 (ref 5–15)
Anion gap: 10 (ref 5–15)
BUN: 23 mg/dL (ref 8–23)
BUN: 24 mg/dL — ABNORMAL HIGH (ref 8–23)
BUN: 24 mg/dL — ABNORMAL HIGH (ref 8–23)
CO2: 18 mmol/L — ABNORMAL LOW (ref 22–32)
CO2: 19 mmol/L — ABNORMAL LOW (ref 22–32)
CO2: 19 mmol/L — ABNORMAL LOW (ref 22–32)
Calcium: 8 mg/dL — ABNORMAL LOW (ref 8.9–10.3)
Calcium: 8.2 mg/dL — ABNORMAL LOW (ref 8.9–10.3)
Calcium: 9.1 mg/dL (ref 8.9–10.3)
Chloride: 112 mmol/L — ABNORMAL HIGH (ref 98–111)
Chloride: 116 mmol/L — ABNORMAL HIGH (ref 98–111)
Chloride: 117 mmol/L — ABNORMAL HIGH (ref 98–111)
Creatinine, Ser: 1.61 mg/dL — ABNORMAL HIGH (ref 0.44–1.00)
Creatinine, Ser: 1.66 mg/dL — ABNORMAL HIGH (ref 0.44–1.00)
Creatinine, Ser: 1.98 mg/dL — ABNORMAL HIGH (ref 0.44–1.00)
GFR calc Af Amer: 27 mL/min — ABNORMAL LOW (ref >60)
GFR calc Af Amer: 34 mL/min — ABNORMAL LOW (ref >60)
GFR calc Af Amer: 35 mL/min — ABNORMAL LOW (ref >60)
GFR calc non Af Amer: 23 mL/min — ABNORMAL LOW (ref >60)
GFR calc non Af Amer: 29 mL/min — ABNORMAL LOW (ref >60)
GFR calc non Af Amer: 30 mL/min — ABNORMAL LOW (ref >60)
GLUCOSE: 150 mg/dL — AB (ref 70–99)
Glucose, Bld: 139 mg/dL — ABNORMAL HIGH (ref 70–99)
Glucose, Bld: 94 mg/dL (ref 70–99)
POTASSIUM: 5.1 mmol/L (ref 3.5–5.1)
POTASSIUM: 3.9 mmol/L (ref 3.5–5.1)
Potassium: 3.9 mmol/L (ref 3.5–5.1)
Sodium: 143 mmol/L (ref 135–145)
Sodium: 144 mmol/L (ref 135–145)
Sodium: 145 mmol/L (ref 135–145)

## 2018-02-28 LAB — CBC
HCT: 36.9 % (ref 36.0–46.0)
Hemoglobin: 12.1 g/dL (ref 12.0–15.0)
MCH: 26.9 pg (ref 26.0–34.0)
MCHC: 32.8 g/dL (ref 30.0–36.0)
MCV: 82 fL (ref 80.0–100.0)
NRBC: 0 % (ref 0.0–0.2)
Platelets: 267 10*3/uL (ref 150–400)
RBC: 4.5 MIL/uL (ref 3.87–5.11)
RDW: 15.7 % — ABNORMAL HIGH (ref 11.5–15.5)
WBC: 15.8 10*3/uL — ABNORMAL HIGH (ref 4.0–10.5)

## 2018-02-28 LAB — TYPE AND SCREEN
ABO/RH(D): B NEG
Antibody Screen: NEGATIVE

## 2018-02-28 LAB — POCT ACTIVATED CLOTTING TIME: Activated Clotting Time: 362 seconds

## 2018-02-28 LAB — POCT I-STAT CREATININE: Creatinine, Ser: 1.4 mg/dL — ABNORMAL HIGH (ref 0.44–1.00)

## 2018-02-28 LAB — MRSA PCR SCREENING: MRSA by PCR: NEGATIVE

## 2018-02-28 LAB — ABO/RH: ABO/RH(D): B NEG

## 2018-02-28 SURGERY — CORONARY/GRAFT ACUTE MI REVASCULARIZATION
Anesthesia: LOCAL

## 2018-02-28 MED ORDER — SODIUM CHLORIDE 0.9 % IV SOLN: 250.0000 mL | INTRAVENOUS | Status: DC | PRN

## 2018-02-28 MED ORDER — HYDROXYCHLOROQUINE SULFATE 200 MG PO TABS: 200.0000 mg | ORAL_TABLET | Freq: Every day | ORAL | Status: DC

## 2018-02-28 MED ORDER — LEVETIRACETAM 500 MG PO TABS
1000.0000 mg | ORAL_TABLET | Freq: Every day | ORAL | Status: DC
  Administered 2018-02-28 – 2018-03-08 (×9): 1000 mg via ORAL
  Filled 2018-02-28: qty 4
  Filled 2018-02-28 (×3): qty 2
  Filled 2018-02-28: qty 4
  Filled 2018-02-28 (×5): qty 2
  Filled 2018-02-28: qty 4
  Filled 2018-02-28: qty 2

## 2018-02-28 MED ORDER — METOPROLOL TARTRATE 5 MG/5ML IV SOLN
INTRAVENOUS | Status: AC
  Filled 2018-02-28: qty 5

## 2018-02-28 MED ORDER — HEPARIN SODIUM (PORCINE) 1000 UNIT/ML IJ SOLN
INTRAMUSCULAR | Status: DC | PRN
  Administered 2018-02-28: 3000 [IU] via INTRAVENOUS
  Administered 2018-02-28: 5000 [IU] via INTRAVENOUS

## 2018-02-28 MED ORDER — PANTOPRAZOLE SODIUM 40 MG IV SOLR
40.0000 mg | Freq: Every day | INTRAVENOUS | Status: DC
  Administered 2018-02-28 – 2018-03-01 (×2): 40 mg via INTRAVENOUS
  Filled 2018-02-28 (×2): qty 40

## 2018-02-28 MED ORDER — IOHEXOL 350 MG/ML SOLN
INTRAVENOUS | Status: DC | PRN
  Administered 2018-02-28: 100 mL via INTRA_ARTERIAL

## 2018-02-28 MED ORDER — ORAL CARE MOUTH RINSE
15.0000 mL | OROMUCOSAL | Status: DC
  Administered 2018-02-28 – 2018-03-01 (×12): 15 mL via OROMUCOSAL

## 2018-02-28 MED ORDER — ETOMIDATE 2 MG/ML IV SOLN
20.0000 mg | Freq: Once | INTRAVENOUS | Status: AC
  Administered 2018-02-27: 20 mg via INTRAVENOUS

## 2018-02-28 MED ORDER — HYDRALAZINE HCL 20 MG/ML IJ SOLN: 5.0000 mg | INTRAMUSCULAR | Status: DC | PRN

## 2018-02-28 MED ORDER — CHLORHEXIDINE GLUCONATE 0.12% ORAL RINSE (MEDLINE KIT)
15.0000 mL | Freq: Two times a day (BID) | OROMUCOSAL | Status: DC
  Administered 2018-02-28 – 2018-03-01 (×3): 15 mL via OROMUCOSAL

## 2018-02-28 MED ORDER — MIDAZOLAM HCL 2 MG/2ML IJ SOLN
INTRAMUSCULAR | Status: AC
  Filled 2018-02-28: qty 2

## 2018-02-28 MED ORDER — SODIUM CHLORIDE 0.9% FLUSH: 3.0000 mL | INTRAVENOUS | Status: DC | PRN

## 2018-02-28 MED ORDER — SODIUM CHLORIDE 0.9 % IV SOLN
INTRAVENOUS | Status: AC
  Administered 2018-02-28: 04:00:00 via INTRAVENOUS

## 2018-02-28 MED ORDER — HYDRALAZINE HCL 20 MG/ML IJ SOLN
10.0000 mg | INTRAMUSCULAR | Status: DC | PRN
  Administered 2018-03-01: 10 mg via INTRAVENOUS
  Filled 2018-02-28: qty 1

## 2018-02-28 MED ORDER — METOPROLOL TARTRATE 5 MG/5ML IV SOLN
5.0000 mg | Freq: Once | INTRAVENOUS | Status: AC
  Administered 2018-02-28: 5 mg via INTRAVENOUS

## 2018-02-28 MED ORDER — ONDANSETRON HCL 4 MG/2ML IJ SOLN
4.0000 mg | Freq: Four times a day (QID) | INTRAMUSCULAR | Status: DC | PRN
  Administered 2018-03-05: 4 mg via INTRAVENOUS

## 2018-02-28 MED ORDER — HYDRALAZINE HCL 20 MG/ML IJ SOLN
INTRAMUSCULAR | Status: DC | PRN
  Administered 2018-02-28: 20 mg via INTRAVENOUS
  Administered 2018-02-28: 10 mg via INTRAVENOUS

## 2018-02-28 MED ORDER — VERAPAMIL HCL 2.5 MG/ML IV SOLN
INTRAVENOUS | Status: AC
  Filled 2018-02-28: qty 2

## 2018-02-28 MED ORDER — TICAGRELOR 90 MG PO TABS
90.0000 mg | ORAL_TABLET | Freq: Two times a day (BID) | ORAL | Status: DC
  Administered 2018-02-28 – 2018-03-08 (×16): 90 mg via ORAL
  Filled 2018-02-28 (×17): qty 1

## 2018-02-28 MED ORDER — ACETAMINOPHEN 325 MG PO TABS
650.0000 mg | ORAL_TABLET | ORAL | Status: DC | PRN
  Administered 2018-03-05: 650 mg via ORAL
  Filled 2018-02-28 (×2): qty 2

## 2018-02-28 MED ORDER — LIDOCAINE HCL (PF) 1 % IJ SOLN
INTRAMUSCULAR | Status: AC
  Filled 2018-02-28: qty 30

## 2018-02-28 MED ORDER — HEPARIN SODIUM (PORCINE) 1000 UNIT/ML IJ SOLN
INTRAMUSCULAR | Status: AC
  Filled 2018-02-28: qty 1

## 2018-02-28 MED ORDER — LIDOCAINE HCL (PF) 1 % IJ SOLN
INTRAMUSCULAR | Status: DC | PRN
  Administered 2018-02-28: 2 mL

## 2018-02-28 MED ORDER — LABETALOL HCL 5 MG/ML IV SOLN: 10.0000 mg | INTRAVENOUS | Status: AC | PRN

## 2018-02-28 MED ORDER — HEPARIN (PORCINE) IN NACL 1000-0.9 UT/500ML-% IV SOLN
INTRAVENOUS | Status: DC | PRN
  Administered 2018-02-28 (×3): 500 mL

## 2018-02-28 MED ORDER — MIDAZOLAM HCL 2 MG/2ML IJ SOLN: 1.0000 mg | INTRAMUSCULAR | Status: DC | PRN

## 2018-02-28 MED ORDER — ROCURONIUM BROMIDE 50 MG/5ML IV SOLN
80.0000 mg | Freq: Once | INTRAVENOUS | Status: AC
  Administered 2018-02-27: 80 mg via INTRAVENOUS
  Filled 2018-02-28: qty 8

## 2018-02-28 MED ORDER — HEPARIN (PORCINE) IN NACL 1000-0.9 UT/500ML-% IV SOLN
INTRAVENOUS | Status: AC
  Filled 2018-02-28: qty 500

## 2018-02-28 MED ORDER — ASPIRIN 81 MG PO CHEW
81.0000 mg | CHEWABLE_TABLET | Freq: Every day | ORAL | Status: DC
  Administered 2018-02-28 – 2018-03-04 (×5): 81 mg via ORAL
  Filled 2018-02-28 (×5): qty 1

## 2018-02-28 MED ORDER — TICAGRELOR 90 MG PO TABS
ORAL_TABLET | ORAL | Status: AC
  Filled 2018-02-28: qty 2

## 2018-02-28 MED ORDER — HYDRALAZINE HCL 20 MG/ML IJ SOLN
INTRAMUSCULAR | Status: AC
  Filled 2018-02-28: qty 1

## 2018-02-28 MED ORDER — MIDAZOLAM HCL 2 MG/2ML IJ SOLN
INTRAMUSCULAR | Status: DC | PRN
  Administered 2018-02-28: 2 mg via INTRAVENOUS

## 2018-02-28 MED ORDER — ONDANSETRON HCL 4 MG PO TABS: 4.0000 mg | ORAL_TABLET | Freq: Three times a day (TID) | ORAL | Status: DC | PRN

## 2018-02-28 MED ORDER — ATORVASTATIN CALCIUM 80 MG PO TABS
80.0000 mg | ORAL_TABLET | Freq: Every day | ORAL | Status: DC
  Administered 2018-02-28 – 2018-03-07 (×7): 80 mg via ORAL
  Filled 2018-02-28 (×8): qty 1

## 2018-02-28 MED ORDER — CARVEDILOL 6.25 MG PO TABS: 6.2500 mg | ORAL_TABLET | Freq: Two times a day (BID) | ORAL | Status: DC

## 2018-02-28 MED ORDER — HYDRALAZINE HCL 25 MG PO TABS: 25.0000 mg | ORAL_TABLET | Freq: Three times a day (TID) | ORAL | Status: DC

## 2018-02-28 MED ORDER — TIROFIBAN (AGGRASTAT) BOLUS VIA INFUSION
INTRAVENOUS | Status: DC | PRN
  Administered 2018-02-28: 1250 ug via INTRAVENOUS

## 2018-02-28 MED ORDER — NITROGLYCERIN 1 MG/10 ML FOR IR/CATH LAB
INTRA_ARTERIAL | Status: AC
  Filled 2018-02-28: qty 10

## 2018-02-28 MED ORDER — HEPARIN (PORCINE) IN NACL 1000-0.9 UT/500ML-% IV SOLN
INTRAVENOUS | Status: AC
  Filled 2018-02-28: qty 1000

## 2018-02-28 MED ORDER — POTASSIUM CHLORIDE 10 MEQ/100ML IV SOLN
INTRAVENOUS | Status: AC | PRN
  Administered 2018-02-28: 10 meq via INTRAVENOUS

## 2018-02-28 MED ORDER — SODIUM CHLORIDE 0.9 % IV SOLN
INTRAVENOUS | Status: AC | PRN
  Administered 2018-02-28: 10 mL/h via INTRAVENOUS

## 2018-02-28 MED ORDER — SODIUM CHLORIDE 0.9% FLUSH
3.0000 mL | Freq: Two times a day (BID) | INTRAVENOUS | Status: DC
  Administered 2018-03-01 – 2018-03-07 (×11): 3 mL via INTRAVENOUS

## 2018-02-28 MED ORDER — VERAPAMIL HCL 2.5 MG/ML IV SOLN
INTRAVENOUS | Status: DC | PRN
  Administered 2018-02-28: 10 mL via INTRA_ARTERIAL

## 2018-02-28 MED ORDER — ASPIRIN EC 81 MG PO TBEC: 81.0000 mg | DELAYED_RELEASE_TABLET | Freq: Every day | ORAL | Status: DC

## 2018-02-28 MED ORDER — TICAGRELOR 90 MG PO TABS
ORAL_TABLET | ORAL | Status: DC | PRN
  Administered 2018-02-28: 180 mg

## 2018-02-28 MED ORDER — SODIUM CHLORIDE 0.9 % IV SOLN
INTRAVENOUS | Status: AC | PRN
  Administered 2018-02-28: 100 mL/h via INTRAVENOUS

## 2018-02-28 MED ORDER — POTASSIUM CHLORIDE 10 MEQ/100ML IV SOLN
INTRAVENOUS | Status: AC
  Filled 2018-02-28: qty 100

## 2018-02-28 MED ORDER — TIROFIBAN HCL IN NACL 5-0.9 MG/100ML-% IV SOLN
INTRAVENOUS | Status: DC | PRN
  Administered 2018-02-28: 0.075 ug/kg/min via INTRAVENOUS

## 2018-02-28 MED ORDER — SODIUM CHLORIDE 0.9 % IV SOLN
INTRAVENOUS | Status: DC
  Administered 2018-02-28: 11:00:00 via INTRAVENOUS

## 2018-02-28 SURGICAL SUPPLY — 19 items
BALLN SAPPHIRE 2.5X15 (BALLOONS) ×2
BALLN SAPPHIRE ~~LOC~~ 3.0X18 (BALLOONS) ×2 IMPLANT
BALLOON SAPPHIRE 2.5X15 (BALLOONS) ×1 IMPLANT
CATH 5FR JL3.5 JR4 ANG PIG MP (CATHETERS) ×2 IMPLANT
CATH EXTRAC PRONTO 5.5F 138CM (CATHETERS) ×2 IMPLANT
CATHETER LAUNCHER 6FR JR4 SH (CATHETERS) ×2 IMPLANT
DEVICE RAD COMP TR BAND LRG (VASCULAR PRODUCTS) ×2 IMPLANT
GLIDESHEATH SLEND SS 6F .021 (SHEATH) ×4 IMPLANT
GUIDEWIRE INQWIRE 1.5J.035X260 (WIRE) ×1 IMPLANT
INQWIRE 1.5J .035X260CM (WIRE) ×2
KIT ENCORE 26 ADVANTAGE (KITS) ×2 IMPLANT
KIT HEART LEFT (KITS) ×2 IMPLANT
KIT HEMO VALVE WATCHDOG (MISCELLANEOUS) ×2 IMPLANT
PACK CARDIAC CATHETERIZATION (CUSTOM PROCEDURE TRAY) ×2 IMPLANT
SHEATH PROBE COVER 6X72 (BAG) ×2 IMPLANT
STENT SYNERGY DES 2.5X38 (Permanent Stent) ×2 IMPLANT
TRANSDUCER W/STOPCOCK (MISCELLANEOUS) ×2 IMPLANT
TUBING CIL FLEX 10 FLL-RA (TUBING) ×2 IMPLANT
WIRE SION BLUE 180 (WIRE) ×2 IMPLANT

## 2018-02-28 NOTE — H&P (Signed)
Cardiology History & Physical    Patient ID: BERNYCE BRIMLEY MRN: [id number], DOB: 15-Nov-1940 Date of Encounter: 02/28/2018, 12:22 AM Primary Physician: Glendale Chard, MD Primary Cardiologist: No primary care provider on file. Primary Electrophysiologist:  None  Chief Complaint: vomiting, unresponsive Reason for Admission: STEMI Requesting MD: April Palumbo  HPI: MARCA GADSBY is a 78 y.o. 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  History obtained from chart review and ED staff.  Patient reportedly fell at home and vomited.  Family moved her to a chair and called EMS.  Initial BP was 86/52 with HR 50.  She was unresponsive, so EMS initiated bagging and transported her to the ED.  Here, patient was hypotensive to 70/60 on arrival.  She was intubated in the ED due to concern for airway compromise due to vomiting and AMS.  BP improved with aggressive IV fluid resuscitation.  She was given PR aspirin and IV heparin, and transported to the cath lab.  Past Medical History:  Diagnosis Date  . Acute kidney injury superimposed on chronic kidney disease ([place]) 02/06/2015  . Anxiety   . Depression   . DVT (deep venous thrombosis) (HCC) 1970s   LLE  . Heart murmur   . Hypercholesterolemia   . Hypertension   . Kidney stones   . Migraine    "a few/year" (07/16/2015)  . Multiple falls   . Pneumonia "several times"  . Rheumatoid arthritis(714.0)    "all over" (07/16/2015)  . Seizure [facility])    "last one was 1//2017" (07/16/2015)  . Syncope and collapse   . Vision abnormalities      Surgical History:  Past Surgical History:  Procedure Laterality Date  . ABDOMINAL HYSTERECTOMY    . CARPAL TUNNEL RELEASE Bilateral   . CATARACT EXTRACTION W/ INTRAOCULAR LENS  IMPLANT, BILATERAL Bilateral   . NECK EXPLORATION  07/16/2015  . PARATHYROIDECTOMY Right 07/16/2015   superior  . PARATHYROIDECTOMY Right 07/16/2015   Procedure: RIGHT INFERIOR  PARATHYROIDECTOMY;  Surgeon: Armandina Gemma, MD;  Location: [facility];  Service: General;  Laterality: Right;  . TOENAIL AVULSION Bilateral    great toe     Home Meds: Prior to Admission medications   Medication Sig Start Date End Date Taking? Authorizing Provider  aspirin EC 81 MG tablet Take 81 mg by mouth daily.    [provider]  carvedilol (COREG) 6.25 MG tablet Take 1 tablet (6.25 mg total) by mouth 2 (two) times daily with a meal. 09/23/16   Julianne Rice, MD  HYDROcodone-acetaminophen (NORCO/VICODIN) 5-325 MG tablet Take 1-2 tablets by mouth every 4 (four) hours as needed for moderate pain.    [provider]  hydroxychloroquine (PLAQUENIL) 200 MG tablet Take 200 mg by mouth daily.  08/02/15   [provider]  levETIRAcetam (KEPPRA) 500 MG tablet Take 2 tablets (1,000 mg total) by mouth daily. 09/23/16   Julianne Rice, MD  ondansetron (ZOFRAN) 4 MG tablet Take 1 tablet (4 mg total) by mouth every 8 (eight) hours as needed for nausea or vomiting. 09/23/16   Julianne Rice, MD  Vitamin D, Ergocalciferol, (DRISDOL) 50000 units CAPS capsule Take 1 capsule (50,000 Units total) by mouth every 7 (seven) days. 08/14/16   Modena Jansky, MD    Allergies: No Known Allergies  Social History   Socioeconomic History  . Marital status: Married    Spouse name: Not on file  . Number of children: Not on file  .  Years of education: Not on file  . Highest education level: Not on file  Occupational History  . Not on file  Social Needs  . Financial resource strain: Somewhat hard  . Food insecurity:    Worry: Never true    Inability: Never true  . Transportation needs:    Medical: Yes    Non-medical: No  Tobacco Use  . Smoking status: Current Every Day Smoker    Packs/day: 1.00    Years: 62.00    Pack years: 62.00    Types: Cigarettes  . Smokeless tobacco: Never Used  Substance and Sexual Activity  . Alcohol use: No  . Drug use: No  . Sexual activity: Never    Lifestyle  . Physical activity:    Days per week: Not on file    Minutes per session: Not on file  . Stress: Not on file  Relationships  . Social connections:    Talks on phone: Not on file    Gets together: Not on file    Attends religious service: Not on file    Active member of club or organization: Not on file    Attends meetings of clubs or organizations: Not on file    Relationship status: Not on file  . Intimate partner violence:    Fear of current or ex partner: Not on file    Emotionally abused: Not on file    Physically abused: Not on file    Forced sexual activity: Not on file  Other Topics Concern  . Not on file  Social History Narrative  . Not on file     Family History  Problem Relation Age of Onset  . Cancer Father   . Prostate cancer Father   . Cancer Brother   . Heart disease Brother   . Miscarriages / [place] Brother   . Heart disease Mother   . Pneumonia Mother     Review of Systems: Unable to complete because patient is intubated  Labs:   Lab Results  Component Value Date   WBC 11.8 (H) 02/28/2018   HGB 12.5 02/28/2018   HCT 38.8 02/28/2018   MCV 85.3 02/28/2018   PLT 300 02/28/2018    Recent Labs  Lab 02/27/18 2359  NA 147*  K 2.6*   No results for input(s): CKTOTAL, CKMB, TROPONINI in the last 72 hours. Lab Results  Component Value Date   CHOL 165 02/06/2015   HDL 38 (L) 02/06/2015   LDLCALC 104 (H) 02/06/2015   TRIG 113 02/06/2015   No results found for: DDIMER  Radiology/Studies:  Dg Chest Portable 1 View  Result Date: 02/28/2018 CLINICAL DATA:  Intubation EXAM: PORTABLE CHEST 1 VIEW COMPARISON:  None. FINDINGS: Support Apparatus: --Endotracheal tube: Tip just above the carina. Retraction by 4-5 cm recommended. --Enteric tube:Side port is just below the gastroesophageal junction. Recommend advancement by 7 cm. --Catheter(s):None --Other: None The heart size and mediastinal contours are within normal limits. The lungs are  clear. No pleural effusion or pneumothorax. IMPRESSION: 1. Tip of the endotracheal tube just above the carina. Retraction by 4-5 cm recommended. 2. Enteric tube side port is just below the gastroesophageal junction. Recommend advancement by 7 cm. 3. Clear lungs. Electronically Signed   By: Ulyses Jarred M.D.   On: 02/28/2018 00:20   Wt Readings from Last 3 Encounters:  10/08/17 49.1 kg  09/23/16 59 kg  08/12/16 58.2 kg    EKG: sinus rhythm, inferior STEMI  Physical Exam: Blood pressure [name])  155/96, pulse 88, resp. rate 16, SpO2 100 %. There is no height or weight on file to calculate BMI. General: Intubated, sedated.  Elderly, frail appearing Head: Normocephalic, atraumatic Neck: JVD not visible.  Lungs: Symmetric bilateral breath sounds Heart: RRR with S1 S2. Abdomen: Soft, non-tender, non-distended. Msk:  Strength and tone appear normal for age. Extremities: No clubbing or cyanosis. No edema.  Neuro: intubated, sedated Psych:  Intubated, sedated    Assessment and Plan  1. Inferior STEMI To cath lab.  Further plan based on cath results.     Severity of Illness: The appropriate patient status for this patient is INPATIENT. Inpatient status is judged to be reasonable and necessary in order to provide the required intensity of service to ensure the patient's safety. The patient's presenting symptoms, physical exam findings, and initial radiographic and laboratory data in the context of their chronic comorbidities is felt to place them at high risk for further clinical deterioration. Furthermore, it is not anticipated that the patient will be medically stable for discharge from the hospital within 2 midnights of admission. The following factors support the patient status of inpatient.   " The patient's presenting symptoms include unresponsive. " The worrisome physical exam findings include unresponsive. " The initial radiographic and laboratory data are worrisome because of ST  elevation on ECG. " The chronic co-morbidities include HTN.   * I certify that at the point of admission it is my clinical judgment that the patient will require inpatient hospital care spanning beyond 2 midnights from the point of admission due to high intensity of service, high risk for further deterioration and high frequency of surveillance required.*    For questions or updates, please contact [place] Please consult [email or web address] for contact info under Cardiology/STEMI.  Liliane Bade, MD 02/28/2018, 12:22 AM   I have examined the patient and reviewed assessment and plan and discussed with patient.  Agree with above as stated.    Patient with acute inferior MI.  Patient was unresponsive at one point and hypotensive.  She was intubated in the emergency room.  This did delay her arrival to the Cath Lab.  She had clear inferior ST elevations and lateral ST depressions noted on ECG.  She was brought emergently to the Cath Lab.  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  The right coronary artery was treated with a drug-eluting stent, 2.5 x 38 Synergy, postdilated to greater than 3 mm in diameter.  She had some brief hypotension during the procedure.  She had a AIVR after return of flow.  Hopefully, she can be extubated later this morning.  Appreciate input from critical care for vent management.  The patient will be loaded with Brilinta via OG tube.  Start high-dose statin.  If blood pressure remains high, consider ACE inhibitor if renal function will tolerate.  Will give IV hydralazine post-cath.  Replace potassium.  Will recheck bmet in a few hours.  Larae Grooms

## 2018-02-28 NOTE — Care Management (Signed)
#  2.     S/W ANGEL   @  [facility]  # [phone]  TICAGRELOR : NONE FORMULARY  BRILINTA  90 MG BID COVER- YES CO-PAY- $ 312.00 TIER - 3 DRUG PRIOR APPROVAL- NO  DEDUCTIBLE: NOT MET  PREFERRED PHARMACY : YES [facility]   ALTERNATIVE:  CLOPIDOGREL   75 MG BID COVER- YES COI-PAY- $ 5.00 TIER- 1 DRUG PRIOR APPROVAL- NO

## 2018-02-28 NOTE — Progress Notes (Signed)
Initial Nutrition Assessment  DOCUMENTATION CODES:   Not applicable  INTERVENTION:   If unable to extubate pt, recommend initiation of enteral nutrition via OG tube: - Vital AF 1.2 @ 40 ml/hr (960 ml/day) - Liquid MVI to meet 100% of RDIs  Tube feeding regimen provides 1152 kcal, 72 grams of protein, and 779 ml of H2O (96% of kcal needs, 100% of protein needs).  NUTRITION DIAGNOSIS:   Inadequate oral intake related to inability to eat as evidenced by NPO status.  GOAL:   Patient will meet greater than or equal to 90% of their needs  MONITOR:   Vent status, Labs, Weight trends, I & O's  REASON FOR ASSESSMENT:   Ventilator    ASSESSMENT:   78 year old female who presented to the ED on 2/12 as code STEMI. PMH significant for DVT, HTN, CKD, seizures. Pt intubated in the ED. Pt is s/p cardiac cath and stent placement.  Discussed pt with RN. Spoke with pt's family at bedside. Pt's husband reports that pt typically eats 1 meal daily that might include 2 grilled chicken sandwiches. Pt's husband shares that pt consumes 4-5 ginger ales daily and tea. Per pt's husband, pt typically eats breakfast but that PO intake has been decreased to 1 meal over the last 1 week.  Pt's husband reports pt's UBW as 112 lbs and does not think that pt has lost weight.  Per pt's husband, pt ambulates with walker, cane, and wheelchair depending on what is available.  Patient is currently intubated on ventilator support. OG tube in stomach. MV: 6.4 L/min Temp (24hrs), Avg:98.4 F (36.9 C), Min:93.7 F (34.3 C), Max:100.4 F (38 C)  Propofol: none NS: 20 ml/hr Fentanyl: 2.5 ml/hr  Medications reviewed and include: Protonix  Labs reviewed: BUN 24 (H), creatinine 1.66 (H), LDL 120 (H)  NUTRITION - FOCUSED PHYSICAL EXAM:    Most Recent Value  Orbital Region  Mild depletion  Upper Arm Region  No depletion  Thoracic and Lumbar Region  Unable to assess  Buccal Region  Unable to assess  Temple  Region  No depletion  Clavicle Bone Region  Mild depletion  Clavicle and Acromion Bone Region  Mild depletion  Scapular Bone Region  Unable to assess  Dorsal Hand  No depletion  Patellar Region  Mild depletion  Anterior Thigh Region  Mild depletion  Posterior Calf Region  Mild depletion  Edema (RD Assessment)  None  Hair  Reviewed  Eyes  Reviewed  Mouth  Unable to assess  Skin  Reviewed  Nails  Reviewed       Diet Order:   Diet Order            Diet NPO time specified  Diet effective now              EDUCATION NEEDS:   Not appropriate for education at this time  Skin:  Skin Assessment: Reviewed RN Assessment  Last BM:  PTA/unknown  Height:   Ht Readings from Last 1 Encounters:  08/11/16 5\' 1"  (1.549 m)    Weight:   Wt Readings from Last 1 Encounters:  10/08/17 49.1 kg    Ideal Body Weight:  47.7 kg (calculated using height from 08/11/16)  BMI:  20.47 kg/m^2 (calculated using height from 08/11/16 and weight from 10/08/17)  Estimated Nutritional Needs:   Kcal:  1205  Protein:  70-85 grams  Fluid:  >/= 1.5 L    Gaynell Face, MS, RD, LDN Inpatient Clinical Dietitian Pager: [phone] Weekend/After  Hours: [phone]

## 2018-02-28 NOTE — Progress Notes (Addendum)
PULMONARY / CRITICAL CARE MEDICINE   NAME:  [name], MRN:  [id number], DOB:  [birth year], LOS: 0 ADMISSION DATE:  02/27/2018, CONSULTATION DATE:  02/28/18 REFERRING MD:  Dr. Irish Lack, CHIEF COMPLAINT:  Respiratory failure  BRIEF HISTORY:    78 y.o female s/p cardiac cath, stent placement was admitted to the ICU on mechanical ventilation. HISTORY OF PRESENT ILLNESS   History obtained from the chart. Patient is intubated.  [name] is a 78 y.o. 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  History obtained from chart review and ED staff.  Patient reportedly fell at home and vomited.  Family moved her to a chair and called EMS.  Initial BP was 86/52 with HR 50.  She was unresponsive, so EMS initiated bagging and transported her to the ED.  Here, patient was hypotensive to 70/60 on arrival.  She was intubated in the ED due to concern for airway compromise due to vomiting and AMS.  BP improved with aggressive IV fluid resuscitation.  She was given PR aspirin and IV heparin, and transported to the cath lab. SIGNIFICANT PAST MEDICAL HISTORY   RA CKD Seizure disorder  SIGNIFICANT EVENTS:  2/13: cardiac cath 2/13: intubation STUDIES:   Cardiac cath 2/13: occluded proximal RCA with moderate to severe focal disease past the initial occlusion.  CXR 2/13> ETT stable. No focal opacity, no pneumothorax, no pleural effusion  CULTURES:  none  ANTIBIOTICS:  none  LINES/TUBES:   ETT 2/13 OGT 2/13 Foley 2/13 CONSULTANTS:  PCCM SUBJECTIVE:  Not available  CONSTITUTIONAL: BP (!) 155/94   Pulse (!) 124   Temp 99.5 F (37.5 C)   Resp 17   SpO2 100%   I/O last 3 completed shifts: In: 311.4 [I.V.:311.4] Out: 615 [Urine:615]     Vent Mode: PSV;CPAP FiO2 (%):  [40 %-100 %] 40 % Set Rate:  [15 bmp-16 bmp] 16 bmp Vt Set:  [400 mL-500 mL] 400 mL PEEP:  [5 cmH20] 5 cmH20 Pressure Support:  [10 cmH20] 10 cmH20  PHYSICAL  EXAM: General:  Frail appearing elderly adult female, intubated sedated NAD  Neuro:  Sedated, opens eyes to voice. Does not follow commands  HEENT:  NCAT ETT secure. Trachea midline mmm  Cardiovascular:  RRR s1s2, appreciable systolic murmur. No jvd  Lungs:  CTA bilaterally. No accessory muscle recruitment  Abdomen:  Soft round, non-distended non-tender normoactive x4  Musculoskeletal: symmetrical bulk and tone. No edema Skin: clean dry warm intact    ASSESSMENT AND PLAN    Inferior STEMI - s/p PCI with DES to RCA. - Cardiology following / managing -ASA 81mg  -Ticagrelor 90mg  -atorvastatin 80mg    Acute respiratory failure requiring intubation -Remains intubated post-procedure P - Continue mechanical ventilation -SBT qAM -Not awake enough this morning (2/13) for extubation, continue to reassess  -VAP prevention  - AM CXR if not able to extubate today   Acute on Chronic CKD  - Follow BMP -Cr downtrending this morning   Hx seizures. - Continue preadmission kepra.  Hx HTN. - Hydralazine PRN  - Hold preadmission carvedilol.  Hx RA. - Hold preadmission hydroxychloroquine.  Best Practice / Goals of Care / Disposition.   Diet: NPO. Pain/Anxiety/Delirium protocol (if indicated): Fentanyl gtt / Midazolam PRN.  RASS goal 0 to -1 VAP protocol (if indicated): Continue DVT prophylaxis: SCD's / Heparin. GI prophylaxis: PPI. Glucose control: N/A. Mobility: Bedrest. Code Status: Full. Family Communication: Son at bedside Disposition: ICU.  LABS  Glucose No  results for input(s): GLUCAP in the last 168 hours.  BMET Recent Labs  Lab 02/28/18 0002 02/28/18 0351 02/28/18 0538  NA 143 143 144  K 3.9 3.5 5.1  CL 112*  --  116*  CO2 19*  --  19*  BUN 24*  --  23  CREATININE 1.98*  --  1.61*  GLUCOSE 150*  --  139*    Liver Enzymes No results for input(s): AST, ALT, ALKPHOS, BILITOT, ALBUMIN in the last 168 hours.  Electrolytes Recent Labs  Lab 02/28/18 0002  02/28/18 0538  CALCIUM 9.1 8.2*    CBC Recent Labs  Lab 02/28/18 0002 02/28/18 0351 02/28/18 0538  WBC 11.8*  --  15.8*  HGB 12.5 11.6* 12.1  HCT 38.8 34.0* 36.9  PLT 300  --  267    ABG Recent Labs  Lab 02/27/18 2359 02/28/18 0351  PHART 7.311* 7.405  PCO2ART 37.2 25.8*  PO2ART 435.0* 135.0*    Coag's Recent Labs  Lab 02/28/18 0002  INR 1.08    Sepsis Markers No results for input(s): LATICACIDVEN, PROCALCITON, O2SATVEN in the last 168 hours.  Cardiac Enzymes No results for input(s): TROPONINI, PROBNP in the last 168 hours.    Critical care time:  30 minutes  Becky Gum MSN, AGACNP-BC [place] 02/28/2018, 11:01 AM  Attending Note:  78 year old female s/p stent placement for CAD that was intubated during the procedure.  Patient had sedation decreased but started moving the cath arm and started bleeding so fentanyl was started.  On exam, she is sedate.  Now has a new pressure band with clear lungs.  I reviewed CXR myself, ETT is in a good position.  Discussed with PCCM-NP.  Will allow clotting to occur at cath site.  Once accomplished will stop sedation.  Maintain on wean for now.  If able to stop sedation and mental status improves then will consider extubation.  PCCM will continue to follow.  Son updated bedside.  The patient is critically ill with multiple organ systems failure and requires high complexity decision making for assessment and support, frequent evaluation and titration of therapies, application of advanced monitoring technologies and extensive interpretation of multiple databases.   Critical Care Time devoted to patient care services described in this note is  45  Minutes. This time reflects time of care of this signee Dr Jennet Maduro. This critical care time does not reflect procedure time, or teaching time or supervisory time of PA/NP/Med student/Med Resident etc but could involve care discussion time.  Rush Farmer, M.D. [facility] Pulmonary/Critical Care Medicine. Pager: [phone]. After hours pager: [phone].

## 2018-02-28 NOTE — Care Management (Addendum)
[employer] benefits check sent and pending.  Pershing Skidmore RN, BSN, NCM-BC, ACM-RN [phone] 

## 2018-02-28 NOTE — Plan of Care (Addendum)
Pt in bed, intubated and currently weaning. Not on any sedation. Following commands. Pt is tachycardic with resting HR in the 110's but HR goes up to 140's when pt coughs, is turned, or suctioned. HR does come back down in 110's. Pt family was at bedside this shift. Vital signs are stable. No new complaints or changes at this time. Will continue to monitor.  Problem: Education: Goal: Knowledge of General Education information will improve Description Including pain rating scale, medication(s)/side effects and non-pharmacologic comfort measures Outcome: Progressing   Problem: Health Behavior/Discharge Planning: Goal: Ability to manage health-related needs will improve Outcome: Progressing   Problem: Clinical Measurements: Goal: Ability to maintain clinical measurements within normal limits will improve Outcome: Progressing Goal: Will remain free from infection Outcome: Progressing Goal: Diagnostic test results will improve Outcome: Progressing Goal: Respiratory complications will improve Outcome: Progressing Goal: Cardiovascular complication will be avoided Outcome: Progressing   Problem: Activity: Goal: Risk for activity intolerance will decrease Outcome: Progressing   Problem: Nutrition: Goal: Adequate nutrition will be maintained Outcome: Progressing   Problem: Coping: Goal: Level of anxiety will decrease Outcome: Progressing   Problem: Elimination: Goal: Will not experience complications related to bowel motility Outcome: Progressing Goal: Will not experience complications related to urinary retention Outcome: Progressing   Problem: Pain Managment: Goal: General experience of comfort will improve Outcome: Progressing   Problem: Safety: Goal: Ability to remain free from injury will improve Outcome: Progressing   Problem: Skin Integrity: Goal: Risk for impaired skin integrity will decrease Outcome: Progressing   Problem: Education: Goal: Understanding of  cardiac disease, CV risk reduction, and recovery process will improve Outcome: Progressing Goal: Understanding of medication regimen will improve Outcome: Progressing Goal: Individualized Educational Video(s) Outcome: Progressing   Problem: Activity: Goal: Ability to tolerate increased activity will improve Outcome: Progressing   Problem: Cardiac: Goal: Ability to achieve and maintain adequate cardiopulmonary perfusion will improve Outcome: Progressing Goal: Vascular access site(s) Level 0-1 will be maintained Outcome: Progressing   Problem: Health Behavior/Discharge Planning: Goal: Ability to safely manage health-related needs after discharge will improve Outcome: Progressing

## 2018-02-28 NOTE — Consult Note (Signed)
PULMONARY / CRITICAL CARE MEDICINE   NAME:  [name], MRN:  [id number], DOB:  31-Dec-1940, LOS: 0 ADMISSION DATE:  02/27/2018, CONSULTATION DATE:  02/28/18 REFERRING MD:  Dr. Irish Lack, CHIEF COMPLAINT:  Respiratory failure  BRIEF HISTORY:    78 y.o female s/p cardiac cath, stent placement was admitted to the ICU on mechanical ventilation. HISTORY OF PRESENT ILLNESS   History obtained from the chart. Patient is intubated.  [name] is a 78 y.o. 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  History obtained from chart review and ED staff.  Patient reportedly fell at home and vomited.  Family moved her to a chair and called EMS.  Initial BP was 86/52 with HR 50.  She was unresponsive, so EMS initiated bagging and transported her to the ED.  Here, patient was hypotensive to 70/60 on arrival.  She was intubated in the ED due to concern for airway compromise due to vomiting and AMS.  BP improved with aggressive IV fluid resuscitation.  She was given PR aspirin and IV heparin, and transported to the cath lab. SIGNIFICANT PAST MEDICAL HISTORY   RA CKD Seizure disorder  SIGNIFICANT EVENTS:  2/13: cardiac cath 2/13: intubation STUDIES:   Cardiac cath 2/13: occluded proximal RCA with moderate to severe focal disease past the initial occlusion. CULTURES:  none  ANTIBIOTICS:  none  LINES/TUBES:   ETT 2/13 OGT 2/13 Foley 2/13 CONSULTANTS:   SUBJECTIVE:  Not available  CONSTITUTIONAL: BP (!) 93/59 (BP Location: Left Arm)   Pulse 83   Resp 16   SpO2 100%   No intake/output data recorded.     Vent Mode: PRVC FiO2 (%):  [40 %-100 %] 40 % Set Rate:  [15 bmp] 15 bmp Vt Set:  [500 mL] 500 mL PEEP:  [5 cmH20] 5 cmH20  PHYSICAL EXAM: General:  Sedate. Opens eyes to voice. Does not follow commands. Neuro:  Sedate. Moves all extremities to touch HEENT:  ETT/OGT. PERL. No jvd. Cardiovascular:  S1s2, regular rhythm. 3/6  systolic murmur radiating to the axilla Lungs:  Bilateral breath sounds, no wheezes, crackles or rhonchi. Abdomen:  Soft, non tender. No organomegaly Musculoskeletal:  Warm to touch. Right radial artery. No edema. +1 femoral, dp pulses. Skin: intact.   ASSESSMENT AND PLAN   Inferior STEMI - s/p PCI with DES to RCA. - Cardiology following / managing.  Respiratory insufficiency - due to above.  S/p intubation. - Full vent support. - SBT in AM for hopeful extubation. - Bronchial hygiene. - Follow CXR.  AoCKD. - Follow BMP.  Hx seizures. - Continue preadmission kepra.  Hx HTN. - Hydralazine PRN. - Hold preadmission carvedilol.  Hx RA. - Hold preadmission hydroxychloroquine.  Best Practice / Goals of Care / Disposition.   Diet: NPO. Pain/Anxiety/Delirium protocol (if indicated): Fentanyl gtt / Midazolam PRN.  RASS goal o to -1. VAP protocol (if indicated): In place. DVT prophylaxis: SCD's / Heparin. GI prophylaxis: PPI. Glucose control: N/A. Mobility: Bedrest. Code Status: Full. Family Communication: None available. Disposition: ICU.  LABS  Glucose No results for input(s): GLUCAP in the last 168 hours.  BMET Recent Labs  Lab 02/27/18 2359 02/28/18 0002  NA 147* 143  K 2.6* 3.9  CL  --  112*  CO2  --  19*  BUN  --  24*  CREATININE  --  1.98*  GLUCOSE  --  150*    Liver Enzymes No results for input(s): AST, ALT, ALKPHOS,  BILITOT, ALBUMIN in the last 168 hours.  Electrolytes Recent Labs  Lab 02/28/18 0002  CALCIUM 9.1    CBC Recent Labs  Lab 02/27/18 2359 02/28/18 0002  WBC  --  11.8*  HGB 9.2* 12.5  HCT 27.0* 38.8  PLT  --  300    ABG Recent Labs  Lab 02/27/18 2359  PHART 7.311*  PCO2ART 37.2  PO2ART 435.0*    Coag's Recent Labs  Lab 02/28/18 0002  INR 1.08    Sepsis Markers No results for input(s): LATICACIDVEN, PROCALCITON, O2SATVEN in the last 168 hours.  Cardiac Enzymes No results for input(s): TROPONINI, PROBNP in  the last 168 hours.  PAST MEDICAL HISTORY :   She  has a past medical history of Acute kidney injury superimposed on chronic kidney disease ([place]) (02/06/2015), Anxiety, Depression, DVT (deep venous thrombosis) ([place]) (1970s), Heart murmur, Hypercholesterolemia, Hypertension, Kidney stones, Migraine, Multiple falls, Pneumonia ("several times"), Rheumatoid arthritis(714.0), Seizure ([place]), Syncope and collapse, and Vision abnormalities.  PAST SURGICAL HISTORY:  She  has a past surgical history that includes Abdominal hysterectomy; Carpal tunnel release (Bilateral); Cataract extraction w/ intraocular lens  implant, bilateral (Bilateral); Toenail Avulsion (Bilateral); Neck exploration (07/16/2015); Parathyroidectomy (Right, 07/16/2015); and Parathyroidectomy (Right, 07/16/2015).  No Known Allergies  No current facility-administered medications on file prior to encounter.    Current Outpatient Medications on File Prior to Encounter  Medication Sig  . aspirin EC 81 MG tablet Take 81 mg by mouth daily.  . carvedilol (COREG) 6.25 MG tablet Take 1 tablet (6.25 mg total) by mouth 2 (two) times daily with a meal.  . HYDROcodone-acetaminophen (NORCO/VICODIN) 5-325 MG tablet Take 1-2 tablets by mouth every 4 (four) hours as needed for moderate pain.  . hydroxychloroquine (PLAQUENIL) 200 MG tablet Take 200 mg by mouth daily.   [name] levETIRAcetam (KEPPRA) 500 MG tablet Take 2 tablets (1,000 mg total) by mouth daily.  . ondansetron (ZOFRAN) 4 MG tablet Take 1 tablet (4 mg total) by mouth every 8 (eight) hours as needed for nausea or vomiting.  . Vitamin D, Ergocalciferol, (DRISDOL) 50000 units CAPS capsule Take 1 capsule (50,000 Units total) by mouth every 7 (seven) days.    FAMILY HISTORY:   Her family history includes Cancer in her brother and father; Heart disease in her brother and mother; Miscarriages / Stillbirths in her brother; Pneumonia in her mother; Prostate cancer in her father.  SOCIAL HISTORY:  She   reports that she has been smoking cigarettes. She has a 62.00 pack-year smoking history. She has never used smokeless tobacco. She reports that she does not drink alcohol or use drugs.  REVIEW OF SYSTEMS:    N/A  CRITICAL CARE Performed by: Jesus Genera   Total critical care time: 40 minutes  Critical care time was exclusive of separately billable procedures and treating other patients.  Critical care was necessary to treat or prevent imminent or life-threatening deterioration.  Critical care was time spent personally by me on the following activities: development of treatment plan with patient and/or surrogate as well as nursing, discussions with consultants, evaluation of patient's response to treatment, examination of patient, obtaining history from patient or surrogate, ordering and performing treatments and interventions, ordering and review of laboratory studies, ordering and review of radiographic studies, pulse oximetry and re-evaluation of patient's condition.

## 2018-02-28 NOTE — Progress Notes (Addendum)
Progress Note  Patient Name: [name] Date of Encounter: 02/28/2018  Primary Cardiologist: No primary care provider on file.   Subjective   Patient intubated and sedated this AM. Not following commands. Issues with bleeding and hematoma overnight on the right arm.   Inpatient Medications    Scheduled Meds: . aspirin  81 mg Oral Daily  . atorvastatin  80 mg Oral q1800  . chlorhexidine gluconate (MEDLINE KIT)  15 mL Mouth Rinse BID  . levETIRAcetam  1,000 mg Oral Daily  . mouth rinse  15 mL Mouth Rinse 10 times per day  . pantoprazole (PROTONIX) IV  40 mg Intravenous Daily  . sodium chloride flush  3 mL Intravenous Q12H  . ticagrelor  90 mg Oral BID   Continuous Infusions: . sodium chloride    . fentaNYL infusion INTRAVENOUS 50 mcg/hr (02/28/18 0400)   PRN Meds: sodium chloride, acetaminophen, hydrALAZINE, midazolam, midazolam, ondansetron (ZOFRAN) IV, ondansetron, sodium chloride flush   Vital Signs    Vitals:   02/28/18 0600 02/28/18 0700 02/28/18 0800 02/28/18 0810  BP: 135/74 120/78 (!) 148/80 (!) 148/80  Pulse: (!) 108 (!) 117 (!) 120 (!) 128  Resp: '13 16 16 12  ' Temp: 98.4 F (36.9 C) 99.7 F (37.6 C) (!) 100.4 F (38 C)   TempSrc: Esophageal     SpO2: 100% 100% 100% 100%    Intake/Output Summary (Last 24 hours) at 02/28/2018 7672 Last data filed at 02/28/2018 0700 Gross per 24 hour  Intake 311.41 ml  Output 615 ml  Net -303.59 ml   There were no vitals filed for this visit.  Telemetry    Sinus tachycardia - Personally Reviewed  ECG    Sinus tachycardia with improvement in ST elevation in inferior leads. - Personally Reviewed  Physical Exam   Today's Vitals   02/28/18 0600 02/28/18 0700 02/28/18 0800 02/28/18 0810  BP: 135/74 120/78 (!) 148/80 (!) 148/80  Pulse: (!) 108 (!) 117 (!) 120 (!) 128  Resp: '13 16 16 12  ' Temp: 98.4 F (36.9 C) 99.7 F (37.6 C) (!) 100.4 F (38 C)   TempSrc: Esophageal     SpO2: 100% 100% 100% 100%  PainSc:        There is no height or weight on file to calculate BMI.  GEN: No acute distress.   HENT: Pupils pin point Cardiac: Tachycardic but regular, no murmurs, rubs, or gallops.  Respiratory: Clear to auscultation bilaterally. GI: Soft, nontender, non-distended  MS: Right forearm ecchymosis. TR band in place. Soft.  Neuro:  Nonfocal  Psych: Normal affect   Labs    Chemistry Recent Labs  Lab 02/28/18 0002 02/28/18 0351 02/28/18 0538  NA 143 143 144  K 3.9 3.5 5.1  CL 112*  --  116*  CO2 19*  --  19*  GLUCOSE 150*  --  139*  BUN 24*  --  23  CREATININE 1.98*  --  1.61*  CALCIUM 9.1  --  8.2*  GFRNONAA 23*  --  30*  GFRAA 27*  --  35*  ANIONGAP 12  --  9    Hematology Recent Labs  Lab 02/28/18 0002 02/28/18 0351 02/28/18 0538  WBC 11.8*  --  15.8*  RBC 4.55  --  4.50  HGB 12.5 11.6* 12.1  HCT 38.8 34.0* 36.9  MCV 85.3  --  82.0  MCH 27.5  --  26.9  MCHC 32.2  --  32.8  RDW 15.8*  --  15.7*  PLT 300  --  267   Cardiac EnzymesNo results for input(s): TROPONINI in the last 168 hours.  Recent Labs  Lab 02/27/18 2356  TROPIPOC 0.01    BNPNo results for input(s): BNP, PROBNP in the last 168 hours.   DDimer No results for input(s): DDIMER in the last 168 hours.   Radiology    Dg Chest Portable 1 View  Result Date: 02/28/2018 CLINICAL DATA:  Intubation EXAM: PORTABLE CHEST 1 VIEW COMPARISON:  None. FINDINGS: Support Apparatus: --Endotracheal tube: Tip just above the carina. Retraction by 4-5 cm recommended. --Enteric tube:Side port is just below the gastroesophageal junction. Recommend advancement by 7 cm. --Catheter(s):None --Other: None The heart size and mediastinal contours are within normal limits. The lungs are clear. No pleural effusion or pneumothorax. IMPRESSION: 1. Tip of the endotracheal tube just above the carina. Retraction by 4-5 cm recommended. 2. Enteric tube side port is just below the gastroesophageal junction. Recommend advancement by 7 cm. 3. Clear  lungs. Electronically Signed   By: Ulyses Jarred M.D.   On: 02/28/2018 00:20   Cardiac Studies   Left Heart Cath 02/28/2018  Prox RCA lesion is 100% stenosed.  A drug-eluting stent was successfully placed using a STENT SYNERGY DES 2.5X38, postdilated to > 3.0 mm.  Post intervention, there is a 0% residual stenosis.  2nd Diag lesion is 40% stenosed.  Ost 2nd Diag lesion is 50% stenosed.  The left ventricular systolic function is normal.  LV end diastolic pressure is normal.  The left ventricular ejection fraction is 55-65% by visual estimate.  There is no aortic valve stenosis.  A drug-eluting stent was successfully placed using a STENT SYNERGY DES 2.5X38.  Patient Profile     78 y.o. female with CKD and RQ who presented to the ED via EMS for an inferior STEMI. She was subsequently intubated for airway protection and PCI was performed.   Assessment & Plan    CAD  STEMI s/p DES to RCA  - Repeat EKG with improvement in inferior ST elevation - Telemetry with sinus tachycardia - Right radial site complicated by bleeding. TR band in place. Good radial pulse.   - Continue DAPT with Ticagrelor 90 mg QD and ASA 81 mg QD - Continue atorvastatin 80 mg QD - Renal function improved this AM - Will need to start beta-blocker and ACE-I prior to discharge   Intubated for airway protection  - PCCM weaning patient. Appreciate their help.   Will discuss the case further with Dr. Irish Lack.   For questions or updates, please contact [place] Please consult [email or web address] for contact info under Cardiology/STEMI.   Signed, Ina Homes, MD  02/28/2018, 8:42 AM    I have examined the patient and reviewed assessment and plan and discussed with patient.  Agree with above as stated.  Patient remains intubated.  I discussed her baseline status with the family.  She has not been very active.  She is limited by arthritis.  Appreciate critical care management of the ventilator.   Hopefully, she can be extubated soon.  Continue dual antiplatelet therapy along with aggressive secondary prevention.  Right forearm stable.  She did have a hematoma.  The forearm is soft.  There is some ecchymosis.  Hemoglobin is stable.  Beta blocker for sinus tach in the setting of MI with normal LV function.   Larae Grooms

## 2018-02-28 NOTE — ED Notes (Signed)
To Cath lab at this time 

## 2018-02-28 NOTE — Progress Notes (Signed)
Chaplain responded to a code stemi. Chaplain reported to room while Pt was being attend. Chaplain checked lobby for family. Husband was waiting. Chaplain escorted husband back and prayed with husband. Other family came (daughter, 2 grandsons). Chaplain checked status and was told Pt would be going to cath lab. Chaplain escorted family to [place] and got refreshments. Chaplain alerted desk that family was in the lobby. Chaplain asked if there were any other needs and none was expressed. Chaplain let family know he is available if needed.    02/28/18 0000  Clinical Encounter Type  Visited With Patient;Family  Visit Type Initial;Code  Referral From Care management  Spiritual Encounters  Spiritual Needs Prayer;Emotional

## 2018-02-28 NOTE — Progress Notes (Addendum)
   02/28/18 1458  Note  Observations TR band off. Level 1. No signs of bleeding at this time. Will continue to monitor.   Transparent gauze dressing

## 2018-02-28 NOTE — Progress Notes (Signed)
Called to 2H18 to assist with a TR band. The staff had difficulty engaging in the TR band deflation protocol due to bleeding with deflation attempts. Upon arrival noted patient r forearm and hand with ecchymosis and heme pooled around the bladder of the TR band. Pt r hand note warm and postive waveform on the SP02% probe- establishing good circulation to the hand. At the base to the hand, a puncture site where a very slight ooze was noted. Elmer,RN advised that respiratory obtained an arterial stick for an ABG during the prior shift. TR Band had 15 cc of air in it.  TR band demarcation not appropriately aligned with the arteriotomy- TR band required repositioning. A manual blood pressure cuff was applied to right forearm to occlude pressure to r hand, so a new TR band could be reapplied. Loann Quill assisted with repositioning-New TR band applied - 10 cc of air inserted- site is stable; TR band with good positioning, mid- forearm area note ecchymotic, but soft, distal area of TR band(base of palm) noted ecchymotic, soft, but swollen, with small puncture site from arterial stick with slight ooze. Pt. r arm elevated to site of heart. SPO2% with positive waveform after TR band repositioned, and patient left with r neurovascular status intact. Pt. Left in the care of RN in stable condition.

## 2018-02-28 NOTE — ED Notes (Signed)
To Cath lab with RN, RR RN, RT and EMT

## 2018-03-01 ENCOUNTER — Inpatient Hospital Stay (HOSPITAL_COMMUNITY): Payer: Medicare PPO

## 2018-03-01 DIAGNOSIS — I2119 ST elevation (STEMI) myocardial infarction involving other coronary artery of inferior wall: Secondary | ICD-10-CM

## 2018-03-01 DIAGNOSIS — J9601 Acute respiratory failure with hypoxia: Secondary | ICD-10-CM

## 2018-03-01 DIAGNOSIS — I361 Nonrheumatic tricuspid (valve) insufficiency: Secondary | ICD-10-CM

## 2018-03-01 LAB — ECHOCARDIOGRAM COMPLETE
Height: 62 in
Weight: 1784.84 oz

## 2018-03-01 LAB — TROPONIN I: Troponin I: 6.05 ng/mL (ref <0.03)

## 2018-03-01 LAB — HEMOGLOBIN A1C
Hgb A1c MFr Bld: 5.2 % (ref 4.8–5.6)
Mean Plasma Glucose: 103 mg/dL

## 2018-03-01 MED ORDER — CHLORHEXIDINE GLUCONATE 0.12 % MT SOLN
15.0000 mL | Freq: Two times a day (BID) | OROMUCOSAL | Status: DC
  Administered 2018-03-02 – 2018-03-08 (×13): 15 mL via OROMUCOSAL
  Filled 2018-03-01 (×14): qty 15

## 2018-03-01 MED ORDER — METOPROLOL TARTRATE 12.5 MG HALF TABLET
12.5000 mg | ORAL_TABLET | Freq: Two times a day (BID) | ORAL | Status: DC
  Administered 2018-03-01 – 2018-03-02 (×2): 12.5 mg
  Filled 2018-03-01 (×3): qty 1

## 2018-03-01 MED ORDER — METOPROLOL TARTRATE 12.5 MG HALF TABLET
12.5000 mg | ORAL_TABLET | Freq: Once | ORAL | Status: AC
  Administered 2018-03-01: 12.5 mg via NASOGASTRIC
  Filled 2018-03-01: qty 1

## 2018-03-01 MED ORDER — ORAL CARE MOUTH RINSE
15.0000 mL | Freq: Two times a day (BID) | OROMUCOSAL | Status: DC
  Administered 2018-03-01 – 2018-03-08 (×12): 15 mL via OROMUCOSAL

## 2018-03-01 MED FILL — Nitroglycerin IV Soln 100 MCG/ML in D5W: INTRA_ARTERIAL | Qty: 10 | Status: AC

## 2018-03-01 NOTE — Care Management Note (Signed)
Case Management Note  Patient Details  Name: [name] MRN: [id number] Date of Birth: 1940-02-09  Subjective/Objective: 78 yo female s/p cath for inferior STEMI s/p stent placement; PMH: CKD Cr 1.4, rheumatoid arthritis, seizures                 Action/Plan: CM consult acknowledged for medication affordability. Patient has active health insurance/drug plan with [facility] and will not be eligible for any patient assistance nor MATCH. Brilinta benefits check is complete with est monthly copay cost $312.00. Patient remains critically ill with vent support, with CM team to continue to follow and will discuss/provide the Brilinta 30-day free card prior to patient transitioning home.  Expected Discharge Date:                  Expected Discharge Plan:  Home/Self Care  In-House Referral:  NA  Discharge planning Services  CM Consult, Medication Assistance(Brilinta benefits check)  Post Acute Care Choice:  NA Choice offered to:  NA  DME Arranged:  N/A DME Agency:  NA  HH Arranged:  NA HH Agency:  NA  Status of Service:  In process, will continue to follow  If discussed at Long Length of Stay Meetings, dates discussed:    Additional Comments:  Midge Minium RN, BSN, NCM-BC, ACM-RN [phone] 03/01/2018, 12:03 PM

## 2018-03-01 NOTE — Plan of Care (Signed)
  Problem: Clinical Measurements: Goal: Respiratory complications will improve Outcome: Progressing   Problem: Coping: Goal: Level of anxiety will decrease Outcome: Progressing   Problem: Elimination: Goal: Will not experience complications related to urinary retention Outcome: Progressing   Problem: Pain Managment: Goal: General experience of comfort will improve Outcome: Progressing   Problem: Safety: Goal: Ability to remain free from injury will improve Outcome: Progressing   Problem: Skin Integrity: Goal: Risk for impaired skin integrity will decrease Outcome: Progressing

## 2018-03-01 NOTE — Progress Notes (Signed)
200 mL of fentanyl wasted in Steri-Cycle container witnessed by Lance Coon RN.

## 2018-03-01 NOTE — Progress Notes (Signed)
  Echocardiogram 2D Echocardiogram has been performed.  Bobbye Charleston 03/01/2018, 11:10 AM

## 2018-03-01 NOTE — Progress Notes (Addendum)
PULMONARY / CRITICAL CARE MEDICINE   NAME:  [name], MRN:  [id number], DOB:  [birth year], LOS: 1 ADMISSION DATE:  02/27/2018, CONSULTATION DATE:  02/28/18 REFERRING MD:  Dr. Irish Lack, CHIEF COMPLAINT:  Respiratory failure  BRIEF HISTORY:    78 y.o female s/p cardiac cath for inferior STMEMI s/p stent placement to DES to RCA.  Was hypotensive and unresponsive in ER requiring intubation.    SIGNIFICANT PAST MEDICAL HISTORY   RA CKD Seizure disorder  SIGNIFICANT EVENTS:  2/13: Admit/ cardiac cath/ intubation   STUDIES:   Cardiac cath 2/13: occluded proximal RCA with moderate to severe focal disease past the initial occlusion  CXR 2/13> ETT stable. No focal opacity, no pneumothorax, no pleural effusion   CULTURES:  none  ANTIBIOTICS:  none  LINES/TUBES:  ETT 2/13 OGT 2/13 Foley 2/13  CONSULTANTS:  PCCM  SUBJECTIVE:  On fentanyl 25 mcg/hr Weaning PSV 5/5 since 0730 without issues or significant tracheal secretions Patient is awake and follow commands  CONSTITUTIONAL: BP 138/72   Pulse (!) 119   Temp 98.8 F (37.1 C)   Resp 17   Ht 5\' 2"  (1.575 m) Comment: estimate  Wt 50.6 kg   SpO2 100%   BMI 20.40 kg/m   I/O last 3 completed shifts: In: 861.8 [I.V.:741.8; NG/GT:120] Out: 1366 [Urine:1366]     Vent Mode: PSV;CPAP FiO2 (%):  [40 %] 40 % Set Rate:  [16 bmp] 16 bmp Vt Set:  [400 mL] 400 mL PEEP:  [5 cmH20] 5 cmH20 Pressure Support:  [5 cmH20] 5 cmH20 Plateau Pressure:  [9 cmH20-13 cmH20] 12 cmH20  PHYSICAL EXAM: General:  Thin elderly female on MV in NAD HEENT: MM pink/moist, ETT/ OGT, no JVD Neuro: Awake, f/c, MAE CV:  Mildly tachycardic, rrr, +murmur PULM: even/non-labored on PSV 5/5, lungs bilaterally coarse GI: soft, non-tender, bs active  Extremities: warm/dry, no LE edema, ecchymosis to right arm  Skin: no rashes  ASSESSMENT AND PLAN    Inferior STEMI - s/p PCI with DES to RCA. - Cardiology following / managing - continue DAPT with  ticagrelor and ASA - atorvastatin 80mg   - metoprolol 12.5mg  BID added   Acute respiratory failure requiring intubation - Extubate now given patient is hemodynamically stable and awake/ alert, weaning well since 0730 - good pulmonary hygiene   Acute on Chronic CKD  - Follow BMP/ UOP - Cr stable    Hx seizures. - Continue preadmission kepra.  Hx HTN. - Hydralazine PRN  - metoprolol as above  Hx RA. - Hold preadmission hydroxychloroquine.  Best Practice / Goals of Care / Disposition.   Diet:  bedside SLP 4 hours after extubation Pain/Anxiety/Delirium protocol (if indicated): d/c sedation VAP protocol (if indicated): d/c DVT prophylaxis: SCD's / Heparin. GI prophylaxis: PPI. Glucose control: N/A Mobility: Bedrest. Code Status: Full. Family Communication: no family at bedside  Disposition: ICU.  LABS  Glucose No results for input(s): GLUCAP in the last 168 hours.  BMET Recent Labs  Lab 02/28/18 0002 02/28/18 0029 02/28/18 0351 02/28/18 0538 02/28/18 1156  NA 143 147* 143 144 145  K 3.9 2.9* 3.5 5.1 3.9  CL 112*  --   --  116* 117*  CO2 19*  --   --  19* 18*  BUN 24*  --   --  23 24*  CREATININE 1.98* 1.40*  --  1.61* 1.66*  GLUCOSE 150*  --   --  139* 94    Liver Enzymes No results for input(s): AST, ALT,  ALKPHOS, BILITOT, ALBUMIN in the last 168 hours.  Electrolytes Recent Labs  Lab 02/28/18 0002 02/28/18 0538 02/28/18 1156  CALCIUM 9.1 8.2* 8.0*    CBC Recent Labs  Lab 02/28/18 0002 02/28/18 0029 02/28/18 0351 02/28/18 0538  WBC 11.8*  --   --  15.8*  HGB 12.5 11.2* 11.6* 12.1  HCT 38.8 33.0* 34.0* 36.9  PLT 300  --   --  267    ABG Recent Labs  Lab 02/27/18 2359 02/28/18 0029 02/28/18 0351  PHART 7.311* 7.353 7.405  PCO2ART 37.2 35.9 25.8*  PO2ART 435.0* 508.0* 135.0*    Coag's Recent Labs  Lab 02/28/18 0002  INR 1.08    Sepsis Markers No results for input(s): LATICACIDVEN, PROCALCITON, O2SATVEN in the last 168  hours.  Cardiac Enzymes Recent Labs  Lab 03/01/18 0909  TROPONINI 6.05*    Critical care time:  30 minutes   Kennieth Rad, MSN, AGACNP-BC [place] [facility] Pgr: [phone] or if no answer [phone] 03/01/2018, 11:20 AM  Attending Note:  78 year old female who was intubated on the cath table after a STEMI.  Patient was diuresed and has done well overnight.  Weaning well this AM on exam with mildly coarse BS diffusely.  I reviewed CXR myself, ETT is in a good position.  Will proceed with extubation today.  Continue diureses as renal function allows.  Titrate O2 for sat of 88-92%.  Ambulate as able.  OOB to chair.  May consider SLP if there is a concern.  SLP.  PCCM will sign off, please call back if needed.  The patient is critically ill with multiple organ systems failure and requires high complexity decision making for assessment and support, frequent evaluation and titration of therapies, application of advanced monitoring technologies and extensive interpretation of multiple databases.   Critical Care Time devoted to patient care services described in this note is  32  Minutes. This time reflects time of care of this signee Dr Jennet Maduro. This critical care time does not reflect procedure time, or teaching time or supervisory time of PA/NP/Med student/Med Resident etc but could involve care discussion time.  Rush Farmer, M.D. [facility] Pulmonary/Critical Care Medicine. Pager: [phone]. After hours pager: [phone].

## 2018-03-01 NOTE — Progress Notes (Signed)
RN performed bedside swallow, patient grimacing and coughed after sip of water.  Patient stated "throat hurt" and that is why she was grimacing but RN concerned about patient's swallow ability.  Swallow evaluation placed and patient kept NPO at this time.

## 2018-03-01 NOTE — Progress Notes (Signed)
eLink Physician-Brief Progress Note Patient Name: [name] DOB: [birth year] MRN: [id number]   Date of Service  03/01/2018  HPI/Events of Note  HR 120's. Inf wall  STEMI LHC stent. Wbc 15 K since 13 th. Low temp. On vent on camera assessment. Discussed with Insurance claims handler. SBP 140. On fenta low dose. Planning for extubation in am. Sepsis less likely. Not on abx.    eICU Interventions  Low dose 12.5 mg lopressor via NG tube once for now. If temp > 100 or 101, need work up for sepsis.      Intervention Category Intermediate Interventions: Arrhythmia - evaluation and management  Elmer Sow 03/01/2018, 2:49 AM

## 2018-03-01 NOTE — Progress Notes (Signed)
RN performed bedside swallow evaluation, patient had difficulty swallowing and c/o throat pain. Patient has Brilinta and Metoprolol PO scheduled for tonight. MD was paged. Orders were to place an NG tube and if unsuccessful then give him a call back. NG tube placement was unsuccessful, pt started coughing a small amount of blood. NG tube was immediately removed. Patient's vitals were stable and MD was paged x3 about her PO meds. Still waiting for further orders. Will continue to monitor for any changes.

## 2018-03-01 NOTE — Progress Notes (Signed)
Nutrition Follow-up  INTERVENTION:   - Once diet advanced, RD to order Ensure Enlive po BID, each supplement provides 350 kcal and 20 grams of protein  NUTRITION DIAGNOSIS:   Inadequate oral intake related to inability to eat as evidenced by NPO status.  Ongoing  GOAL:   Patient will meet greater than or equal to 90% of their needs  Unmet, pt is NPO  MONITOR:   Diet advancement, Weight trends, I & O's, Labs  REASON FOR ASSESSMENT:   Ventilator    ASSESSMENT:   78 year old female who presented to the ED on 2/12 as code STEMI. PMH significant for DVT, HTN, CKD, seizures. Pt intubated in the ED. Pt is s/p cardiac cath and stent placement.  2/14 - extubated  Spoke with pt briefly at bedside. Pt states she is doing well but is tired. Pt states that she is looking forward to eating. RD to order oral nutrition supplements once diet advanced.  Noted plan for SLP to see pt today and assess readiness for diet advancement.  Medications reviewed and include: Protonix IVF: NS @ 20 ml/hr  Labs reviewed: BUN 24 (H), creatinine 1.66 (H)  UOP: 751 ml x 24 hours  Diet Order:   Diet Order            Diet NPO time specified  Diet effective now              EDUCATION NEEDS:   Not appropriate for education at this time  Skin:  Skin Assessment: Reviewed RN Assessment  Last BM:  2/14 small type 4  Height:   Ht Readings from Last 1 Encounters:  02/28/18 5\' 2"  (1.575 m)    Weight:   Wt Readings from Last 1 Encounters:  02/28/18 50.6 kg    Ideal Body Weight:  50 kg  BMI:  Body mass index is 20.4 kg/m.  Estimated Nutritional Needs:   Kcal:  1250-1450  Protein:  65-75 grams  Fluid:  1.3-1.5 L    Gaynell Face, MS, RD, LDN Inpatient Clinical Dietitian Pager: [phone] Weekend/After Hours: [phone]

## 2018-03-01 NOTE — Procedures (Signed)
Extubation Procedure Note  Patient Details:   Name: [name] DOB: 02/24/1940 MRN: [id number]   Airway Documentation:  Airway 7.5 mm (Active)  Secured at (cm) 23 cm 03/01/2018  8:00 AM  Measured From Lips 03/01/2018  8:00 AM  Secured Location Right 03/01/2018  8:00 AM  Secured By [employer] 03/01/2018  8:00 AM  Tube Holder Repositioned Yes 03/01/2018  7:48 AM  Cuff Pressure (cm H2O) 24 cm H2O 03/01/2018  7:48 AM  Site Condition Dry 03/01/2018  7:48 AM   Vent end date: (not recorded) Vent end time: (not recorded)   Evaluation  O2 sats: stable throughout Complications: No apparent complications Patient did tolerate procedure well. Patient breath sounds: clear, diminished    Yes   Patient extubated per order to 4L [place] with no apparent complications. Positive cuff leak was noted prior to extubation. Patient is alert and is able to speak. Vitals are stable. RT will continue to monitor.   Laira Penninger Clyda Greener 03/01/2018, 12:09 PM

## 2018-03-01 NOTE — Progress Notes (Addendum)
Progress Note  Patient Name: [name] Date of Encounter: 03/01/2018  Primary Cardiologist: No primary care provider on file.   Subjective   Patient remains intubated but is much more alert today. She is following simple commands. No chest pain. Discussed plan for modification in medications.   Inpatient Medications    Scheduled Meds: . aspirin  81 mg Oral Daily  . atorvastatin  80 mg Oral q1800  . chlorhexidine gluconate (MEDLINE KIT)  15 mL Mouth Rinse BID  . levETIRAcetam  1,000 mg Oral Daily  . mouth rinse  15 mL Mouth Rinse 10 times per day  . pantoprazole (PROTONIX) IV  40 mg Intravenous Daily  . sodium chloride flush  3 mL Intravenous Q12H  . ticagrelor  90 mg Oral BID   Continuous Infusions: . sodium chloride    . sodium chloride 20 mL/hr at 03/01/18 0700  . fentaNYL infusion INTRAVENOUS 25 mcg/hr (03/01/18 0700)   PRN Meds: sodium chloride, acetaminophen, hydrALAZINE, midazolam, midazolam, ondansetron (ZOFRAN) IV, ondansetron, sodium chloride flush   Vital Signs    Vitals:   03/01/18 0500 03/01/18 0600 03/01/18 0700 03/01/18 0748  BP: (!) 151/95 (!) 133/96 (!) 165/98   Pulse: (!) 109 (!) 108 (!) 114   Resp: '17 16 16 10  ' Temp: 98.4 F (36.9 C) 98.8 F (37.1 C) 99 F (37.2 C)   TempSrc:      SpO2: 100% 100% 100% 100%  Weight:      Height:        Intake/Output Summary (Last 24 hours) at 03/01/2018 0826 Last data filed at 03/01/2018 0800 Gross per 24 hour  Intake 541.46 ml  Output 941 ml  Net -399.54 ml   Filed Weights   02/28/18 1700  Weight: 50.6 kg    Telemetry    Sinus tachycardia - Personally Reviewed  ECG    No new EKG  Physical Exam   Today's Vitals   03/01/18 0500 03/01/18 0600 03/01/18 0700 03/01/18 0748  BP: (!) 151/95 (!) 133/96 (!) 165/98   Pulse: (!) 109 (!) 108 (!) 114   Resp: '17 16 16 10  ' Temp: 98.4 F (36.9 C) 98.8 F (37.1 C) 99 F (37.2 C)   TempSrc:      SpO2: 100% 100% 100% 100%  Weight:      Height:       PainSc:       Body mass index is 20.4 kg/m.  GEN: No acute distress.   HENT: Pupils pin point Cardiac: Tachycardic but regular, systolic murmur.  Respiratory: Clear to auscultation bilaterally. GI: Soft, nontender, non-distended  MS: Right forearm ecchymosis. TR band in place. Soft.  Neuro:  Nonfocal  Psych: Normal affect   Labs    Chemistry Recent Labs  Lab 02/28/18 0002 02/28/18 0029 02/28/18 0351 02/28/18 0538 02/28/18 1156  NA 143 147* 143 144 145  K 3.9 2.9* 3.5 5.1 3.9  CL 112*  --   --  116* 117*  CO2 19*  --   --  19* 18*  GLUCOSE 150*  --   --  139* 94  BUN 24*  --   --  23 24*  CREATININE 1.98* 1.40*  --  1.61* 1.66*  CALCIUM 9.1  --   --  8.2* 8.0*  GFRNONAA 23*  --   --  30* 29*  GFRAA 27*  --   --  35* 34*  ANIONGAP 12  --   --  9 10  Hematology Recent Labs  Lab 02/28/18 0002 02/28/18 0029 02/28/18 0351 02/28/18 0538  WBC 11.8*  --   --  15.8*  RBC 4.55  --   --  4.50  HGB 12.5 11.2* 11.6* 12.1  HCT 38.8 33.0* 34.0* 36.9  MCV 85.3  --   --  82.0  MCH 27.5  --   --  26.9  MCHC 32.2  --   --  32.8  RDW 15.8*  --   --  15.7*  PLT 300  --   --  267   Cardiac EnzymesNo results for input(s): TROPONINI in the last 168 hours.  Recent Labs  Lab 02/27/18 2356  TROPIPOC 0.01    BNPNo results for input(s): BNP, PROBNP in the last 168 hours.   DDimer No results for input(s): DDIMER in the last 168 hours.   Radiology    Portable Chest Xray  Result Date: 02/28/2018 CLINICAL DATA:  Respiratory failure EXAM: PORTABLE CHEST 1 VIEW COMPARISON:  Yesterday FINDINGS: Endotracheal tube tip is 2 cm above the carina. The orogastric tube tip reaches the stomach. Mild retrocardiac linear atelectasis. Prominent heart size accentuated by technique. No edema, effusion, or pneumothorax. IMPRESSION: Stable hardware positioning and mild retrocardiac atelectasis. Electronically Signed   By: Monte Fantasia M.D.   On: 02/28/2018 09:37   Dg Chest Portable 1  View  Result Date: 02/28/2018 CLINICAL DATA:  Intubation EXAM: PORTABLE CHEST 1 VIEW COMPARISON:  None. FINDINGS: Support Apparatus: --Endotracheal tube: Tip just above the carina. Retraction by 4-5 cm recommended. --Enteric tube:Side port is just below the gastroesophageal junction. Recommend advancement by 7 cm. --Catheter(s):None --Other: None The heart size and mediastinal contours are within normal limits. The lungs are clear. No pleural effusion or pneumothorax. IMPRESSION: 1. Tip of the endotracheal tube just above the carina. Retraction by 4-5 cm recommended. 2. Enteric tube side port is just below the gastroesophageal junction. Recommend advancement by 7 cm. 3. Clear lungs. Electronically Signed   By: Ulyses Jarred M.D.   On: 02/28/2018 00:20   Cardiac Studies   Left Heart Cath 02/28/2018  Prox RCA lesion is 100% stenosed.  A drug-eluting stent was successfully placed using a STENT SYNERGY DES 2.5X38, postdilated to > 3.0 mm.  Post intervention, there is a 0% residual stenosis.  2nd Diag lesion is 40% stenosed.  Ost 2nd Diag lesion is 50% stenosed.  The left ventricular systolic function is normal.  LV end diastolic pressure is normal.  The left ventricular ejection fraction is 55-65% by visual estimate.  There is no aortic valve stenosis.  A drug-eluting stent was successfully placed using a STENT SYNERGY DES 2.5X38.  Patient Profile     78 y.o. female with CKD and RQ who presented to the ED via EMS for an inferior STEMI. She was subsequently intubated for airway protection and PCI was performed.   Assessment & Plan    CAD  STEMI s/p DES to RCA  - Right radial site with ecchymosis but soft   - Continue DAPT with Ticagrelor 90 mg QD and ASA 81 mg QD - Continue atorvastatin 80 mg QD - Start metoprolol tartrate 12.5 mg BID - Renal function remains elevated. Continue to follow. Start ACE/ARB once improved.    Intubated for airway protection  - PCCM weaning patient.  Appreciate their help.   Will discuss the case further with Dr. Irish Lack.   For questions or updates, please contact Ferdinand Please consult [email or web address] for contact info under Cardiology/STEMI.  Signed, Ina Homes, MD  03/01/2018, 8:26 AM    I have examined the patient and reviewed assessment and plan and discussed with patient.  Agree with above as stated.  Start beta blocker.    Patient more alert today.  She nodded to questions appropriately.    COntinue DAPT along with aggressive secondary prevention.  Watch renal function.  Cr may preclude ACE-I.    Appreciate PCCM assistance.  Larae Grooms

## 2018-03-01 NOTE — Progress Notes (Signed)
CRITICAL VALUE ALERT  Critical Value:  Troponin 6.05  Date & Time Notied:  03/01/2018  At 1115  Provider Notified: paged MD Craige Cotta.    Orders Received/Actions taken: received return call from MD, expected troponin.  No new orders at this time.

## 2018-03-02 ENCOUNTER — Inpatient Hospital Stay (HOSPITAL_COMMUNITY): Payer: Medicare PPO

## 2018-03-02 LAB — BASIC METABOLIC PANEL WITH GFR
Anion gap: 9 (ref 5–15)
BUN: 42 mg/dL — ABNORMAL HIGH (ref 8–23)
CO2: 17 mmol/L — ABNORMAL LOW (ref 22–32)
Calcium: 8.2 mg/dL — ABNORMAL LOW (ref 8.9–10.3)
Chloride: 125 mmol/L — ABNORMAL HIGH (ref 98–111)
Creatinine, Ser: 1.77 mg/dL — ABNORMAL HIGH (ref 0.44–1.00)
GFR calc Af Amer: 31 mL/min — ABNORMAL LOW
GFR calc non Af Amer: 27 mL/min — ABNORMAL LOW
Glucose, Bld: 112 mg/dL — ABNORMAL HIGH (ref 70–99)
Potassium: 3.6 mmol/L (ref 3.5–5.1)
Sodium: 151 mmol/L — ABNORMAL HIGH (ref 135–145)

## 2018-03-02 LAB — HEMOGLOBIN AND HEMATOCRIT, BLOOD
HCT: 26 % — ABNORMAL LOW (ref 36.0–46.0)
Hemoglobin: 8.4 g/dL — ABNORMAL LOW (ref 12.0–15.0)

## 2018-03-02 MED ORDER — TIROFIBAN (AGGRASTAT) BOLUS VIA INFUSION
25.0000 ug/kg | Freq: Once | INTRAVENOUS | Status: AC
  Administered 2018-03-02: 1265 ug via INTRAVENOUS
  Filled 2018-03-02: qty 26

## 2018-03-02 MED ORDER — TIROFIBAN HCL IV 12.5 MG/250 ML
0.0750 ug/kg/min | INTRAVENOUS | Status: DC
  Administered 2018-03-02: 0.075 ug/kg/min via INTRAVENOUS
  Filled 2018-03-02: qty 250

## 2018-03-02 MED ORDER — CARVEDILOL 6.25 MG PO TABS
6.2500 mg | ORAL_TABLET | Freq: Two times a day (BID) | ORAL | Status: DC
  Administered 2018-03-02 – 2018-03-05 (×6): 6.25 mg via ORAL
  Filled 2018-03-02 (×6): qty 1

## 2018-03-02 MED ORDER — METOPROLOL TARTRATE 5 MG/5ML IV SOLN
INTRAVENOUS | Status: AC
  Administered 2018-03-02: 5 mg
  Filled 2018-03-02: qty 5

## 2018-03-02 MED ORDER — PANTOPRAZOLE SODIUM 40 MG IV SOLR
40.0000 mg | Freq: Two times a day (BID) | INTRAVENOUS | Status: DC
  Administered 2018-03-02 – 2018-03-05 (×8): 40 mg via INTRAVENOUS
  Filled 2018-03-02 (×10): qty 40

## 2018-03-02 NOTE — Evaluation (Signed)
Clinical/Bedside Swallow Evaluation Patient Details  Name: [name] MRN: [id number] Date of Birth: 06-14-1940  Today's Date: 03/02/2018 Time: SLP Start Time (ACUTE ONLY): 0910 SLP Stop Time (ACUTE ONLY): 0919 SLP Time Calculation (min) (ACUTE ONLY): 9 min  Past Medical History:  Past Medical History:  Diagnosis Date  . Acute kidney injury superimposed on chronic kidney disease ([place]) 02/06/2015  . Anxiety   . Depression   . DVT (deep venous thrombosis) ([facility]) 1970s   LLE  . Heart murmur   . Hypercholesterolemia   . Hypertension   . Kidney stones   . Migraine    "a few/year" (07/16/2015)  . Multiple falls   . Pneumonia "several times"  . Rheumatoid arthritis(714.0)    "all over" (07/16/2015)  . Seizure [facility])    "last one was 1//2017" (07/16/2015)  . Syncope and collapse   . Vision abnormalities    Past Surgical History:  Past Surgical History:  Procedure Laterality Date  . ABDOMINAL HYSTERECTOMY    . CARPAL TUNNEL RELEASE Bilateral   . CATARACT EXTRACTION W/ INTRAOCULAR LENS  IMPLANT, BILATERAL Bilateral   . CORONARY/GRAFT ACUTE MI REVASCULARIZATION N/A 02/28/2018   Procedure: Coronary/Graft Acute MI Revascularization;  Surgeon: Jettie Booze, MD;  Location: [place] [facility];  Service: Cardiovascular;  Laterality: N/A;  . LEFT HEART CATH AND CORONARY ANGIOGRAPHY N/A 02/28/2018   Procedure: LEFT HEART CATH AND CORONARY ANGIOGRAPHY;  Surgeon: Jettie Booze, MD;  Location: [place] [facility];  Service: Cardiovascular;  Laterality: N/A;  . NECK EXPLORATION  07/16/2015  . PARATHYROIDECTOMY Right 07/16/2015   superior  . PARATHYROIDECTOMY Right 07/16/2015   Procedure: RIGHT INFERIOR PARATHYROIDECTOMY;  Surgeon: Armandina Gemma, MD;  Location: [place];  Service: General;  Laterality: Right;  . TOENAIL AVULSION Bilateral    great toe   HPI:  [name] is a 78 y.o female s/p cardiac cath for inferior STMEMI s/p stent placement to DES to RCA.  Was hypotensive and  unresponsive in ER requiring intubation.  Pt intubated 2/12-2/14 (around 36 hrs).  NGT placement attempted but unable.   Assessment / Plan / Recommendation Clinical Impression  Pt presents with functional oropharyngeal swallowing as assessed clinically.   Pt's voice is strong, but some hoarseness noted.  Pt endorses this is different from baseline.  Pt tolerated all consistencies trialed with no clinical s/s of aspiration including challenging liquids with serial straw sips.  Pt denied odynophagia with all consistencies.  Pt required prolonged oral phase 2/2 edentulism and there was mild diffuse oral residue.  Pt typically wears dentures with PO intake, but they are not present at this time.  With soft solids oral phase was more efficent with no resdiue.  Pt believes that family will be able to bring dentures this afternoon and would prefer a regular diet, with the ability to choose softer foods while dentures are unavailable.  Recommend regular texture diet with thin liquids.  SLP to follow for diet tolerance. SLP Visit Diagnosis: Dysphagia, unspecified (R13.10)    Aspiration Risk  No limitations    Diet Recommendation Thin liquid;Regular   Liquid Administration via: Straw;Cup Medication Administration: Whole meds with liquid Supervision: Patient able to self feed Compensations: Slow rate;Small sips/bites(Choose soft, moist foods as needed;Oral prosthesis in place for PO intake when available) Postural Changes: Seated upright at 90 degrees    Other  Recommendations Oral Care Recommendations: Oral care BID   Follow up Recommendations        Frequency and Duration min  1 x/week  2 weeks        Swallow Study   General Date of Onset: 02/27/18 HPI: [name] is a 78 y.o female s/p cardiac cath for inferior STMEMI s/p stent placement to DES to RCA.  Was hypotensive and unresponsive in ER requiring intubation.  Pt intubated 2/12-2/14 (around 36 hrs).  NGT placement attempted but  unable. Type of Study: Bedside Swallow Evaluation Diet Prior to this Study: NPO Temperature Spikes Noted: No Respiratory Status: Nasal cannula History of Recent Intubation: Yes Length of Intubations (days): 2 days Date extubated: 03/01/18 Behavior/Cognition: Alert;Cooperative;Pleasant mood Oral Cavity Assessment: Within Functional Limits Oral Cavity - Dentition: Edentulous;Dentures, not available Vision: Functional for self-feeding Self-Feeding Abilities: Able to feed self Patient Positioning: Upright in bed Baseline Vocal Quality: Hoarse Volitional Cough: Strong Volitional Swallow: Able to elicit    Oral/Motor/Sensory Function Overall Oral Motor/Sensory Function: Within functional limits   Ice Chips Ice chips: Not tested   Thin Liquid Thin Liquid: Within functional limits Presentation: Cup;Straw    Nectar Thick Nectar Thick Liquid: Not tested   Honey Thick Honey Thick Liquid: Not tested   Puree Puree: Within functional limits Presentation: Spoon   Solid     Presentation: Self Fed Oral Phase Impairments: Impaired mastication Oral Phase Functional Implications: Oral residue;Prolonged oral transit      Celedonio Savage, MA, [place] Office: [phone]; Pager (2/15): [phone] 03/02/2018,9:33 AM

## 2018-03-02 NOTE — Progress Notes (Signed)
CARDIAC REHAB PHASE I   Patient sleeping when I entered room. Tried to speak with patient but she was too tired for walk/educaiton. Talked with sister about Cardiac Rehab. Gave MI book and other information. Husband would not be arriving until after 5.   Feliz Beam Elexia Friedt, MS 03/02/2018 3:21 PM

## 2018-03-02 NOTE — Progress Notes (Addendum)
Patient ID: [name], female   DOB: 1940-03-06, 78 y.o.   MRN: [id number]   Progress Note  Patient Name: [name] Date of Encounter: 03/02/2018  Primary Cardiologist: No primary care provider on file.   Subjective   Patient was extubated on 2/14.  No complaints today, no dyspnea or chest pain.  She has passed her swallow evaluation.   Echo (2/14): EF > 65%, severe basal septal hypertrophy, LV mid-cavity gradient with peak gradient 29 mmHg, normal RV, moderate MR.    Inpatient Medications    Scheduled Meds: . aspirin  81 mg Oral Daily  . atorvastatin  80 mg Oral q1800  . carvedilol  6.25 mg Oral BID WC  . chlorhexidine  15 mL Mouth Rinse BID  . levETIRAcetam  1,000 mg Oral Daily  . mouth rinse  15 mL Mouth Rinse q12n4p  . pantoprazole (PROTONIX) IV  40 mg Intravenous Q12H  . sodium chloride flush  3 mL Intravenous Q12H  . ticagrelor  90 mg Oral BID   Continuous Infusions: . sodium chloride    . sodium chloride Stopped (03/01/18 1711)   PRN Meds: sodium chloride, acetaminophen, hydrALAZINE, ondansetron (ZOFRAN) IV, ondansetron, sodium chloride flush   Vital Signs    Vitals:   03/02/18 0500 03/02/18 0600 03/02/18 0700 03/02/18 0835  BP: 105/65 135/75 138/76   Pulse: (!) 102 100 (!) 108   Resp: 18 15 14    Temp:    98.4 F (36.9 C)  TempSrc:    Oral  SpO2: 99% 96% 95%   Weight:      Height:        Intake/Output Summary (Last 24 hours) at 03/02/2018 1224 Last data filed at 03/02/2018 0400 Gross per 24 hour  Intake 161.21 ml  Output 150 ml  Net 11.21 ml   Filed Weights   02/28/18 1700  Weight: 50.6 kg    Telemetry    NSR 90s-100s, personally reviewed   Physical Exam   Today's Vitals   03/02/18 0500 03/02/18 0600 03/02/18 0700 03/02/18 0835  BP: 105/65 135/75 138/76   Pulse: (!) 102 100 (!) 108   Resp: 18 15 14    Temp:    98.4 F (36.9 C)  TempSrc:    Oral  SpO2: 99% 96% 95%   Weight:      Height:      PainSc:       Body mass index  is 20.4 kg/m.  General: NAD Neck: No JVD, no thyromegaly or thyroid nodule.  Lungs: Clear to auscultation bilaterally with normal respiratory effort. CV: Nondisplaced PMI.  Heart regular S1/S2, no S3/S4, 2/6 SEM RUSB.  No peripheral edema.   Abdomen: Soft, nontender, no hepatosplenomegaly, no distention.  Skin: Intact without lesions or rashes.  Neurologic: Alert and oriented x 3.  Psych: Normal affect. Extremities: No clubbing or cyanosis. Right arm ecchymosis.  2+ right radial pulse.  HEENT: Normal.    Labs    Chemistry Recent Labs  Lab 02/28/18 0538 02/28/18 1156 03/02/18 0324  NA 144 145 151*  K 5.1 3.9 3.6  CL 116* 117* 125*  CO2 19* 18* 17*  GLUCOSE 139* 94 112*  BUN 23 24* 42*  CREATININE 1.61* 1.66* 1.77*  CALCIUM 8.2* 8.0* 8.2*  GFRNONAA 30* 29* 27*  GFRAA 35* 34* 31*  ANIONGAP 9 10 9     Hematology Recent Labs  Lab 02/28/18 0002  02/28/18 0351 02/28/18 0538 03/02/18 0324  WBC 11.8*  --   --  15.8*  --   RBC 4.55  --   --  4.50  --   HGB 12.5   < > 11.6* 12.1 8.4*  HCT 38.8   < > 34.0* 36.9 26.0*  MCV 85.3  --   --  82.0  --   MCH 27.5  --   --  26.9  --   MCHC 32.2  --   --  32.8  --   RDW 15.8*  --   --  15.7*  --   PLT 300  --   --  267  --    < > = values in this interval not displayed.   Cardiac Enzymes Recent Labs  Lab 03/01/18 0909  TROPONINI 6.05*    Recent Labs  Lab 02/27/18 2356  TROPIPOC 0.01    BNPNo results for input(s): BNP, PROBNP in the last 168 hours.   DDimer No results for input(s): DDIMER in the last 168 hours.   Radiology    Dg Chest 1 View  Result Date: 03/02/2018 CLINICAL DATA:  Coughing blood EXAM: CHEST  1 VIEW COMPARISON:  February 28, 2018 FINDINGS: The heart size and mediastinal contours are within normal limits. The aorta is slightly uncoiled. There is no focal infiltrate, pulmonary edema, or pleural effusion. The visualized skeletal structures are stable. IMPRESSION: No active cardiopulmonary disease.  Electronically Signed   By: Abelardo Diesel M.D.   On: 03/02/2018 02:41   Dg Abd Portable 1v  Result Date: 03/02/2018 CLINICAL DATA:  Nasogastric tube placement. EXAM: PORTABLE ABDOMEN - 1 VIEW COMPARISON:  None. FINDINGS: The bowel gas pattern is normal. Nasogastric tube is identified distal tip in the proximal to mid stomach. Prior cholecystectomy clips are noted. Moderate bowel content is identified in the colon. IMPRESSION: Nasogastric tube is identified distal tip in the proximal to mid stomach. Electronically Signed   By: Abelardo Diesel M.D.   On: 03/02/2018 01:38   Cardiac Studies   Left Heart Cath 02/28/2018  Prox RCA lesion is 100% stenosed.  A drug-eluting stent was successfully placed using a STENT SYNERGY DES 2.5X38, postdilated to > 3.0 mm.  Post intervention, there is a 0% residual stenosis.  2nd Diag lesion is 40% stenosed.  Ost 2nd Diag lesion is 50% stenosed.  The left ventricular systolic function is normal.  LV end diastolic pressure is normal.  The left ventricular ejection fraction is 55-65% by visual estimate.  There is no aortic valve stenosis.  A drug-eluting stent was successfully placed using a STENT SYNERGY DES 2.5X38.  Patient Profile     78 y.o. female with CKD and RQ who presented to the ED via EMS for an inferior STEMI. She was subsequently intubated for airway protection and PCI was performed.   Assessment & Plan    1. CAD: Inferior STEMI s/p DES to RCA. No further chest pain. Echo with EF > 65%, LV mid-cavity gradient, normal RV, moderate MR.  - Continue DAPT with Ticagrelor 90 mg QD and ASA 81 mg QD. Tirofiban stopped now that she has passed swallow study and can take ticagrelor.  - Continue atorvastatin 80 mg QD - Stop metoprolol and will start Coreg 6.25 mg bid. With vigorous LV and mid-cavity gradient, will titrate up.  - Renal function remains elevated. Continue to follow. Start ACE/ARB once improved.   2. AKI on CKD stage 3: Creatinine  baseline around 1.3-1.4, now up to 1.77.  No further diuretics needed (euvolemic) and holding off on ACEI/ARB for now.  3. Intubated for airway protection: Extubated 2/14 and stable. CXR clear.   4. Hypernatremia: Should resolve now that she is going to be able to take po (passed swallow study).   Ok to go to telemetry.    For questions or updates, please contact [place] Please consult [email or web address] for contact info under Cardiology/STEMI.   Signed, Loralie Champagne, MD  03/02/2018, 12:24 PM

## 2018-03-02 NOTE — Progress Notes (Signed)
Report called to Ray on 6E at this time. Will transfer pt in te bed shortly.

## 2018-03-02 NOTE — Progress Notes (Signed)
Patient coughed up blood. Vitals were stable. MD was paged and verbal orders to get a STAT chest x-ray, H&H. Since patient is unable to swallow meds at this time. Orders were to start her on a Aggrastat drip and give 5mg  of Lopressor IV. Will continue to monitor for any changes.

## 2018-03-03 LAB — BASIC METABOLIC PANEL
Anion gap: 7 (ref 5–15)
BUN: 36 mg/dL — ABNORMAL HIGH (ref 8–23)
CHLORIDE: 120 mmol/L — AB (ref 98–111)
CO2: 18 mmol/L — ABNORMAL LOW (ref 22–32)
Calcium: 8.3 mg/dL — ABNORMAL LOW (ref 8.9–10.3)
Creatinine, Ser: 1.78 mg/dL — ABNORMAL HIGH (ref 0.44–1.00)
GFR calc Af Amer: 31 mL/min — ABNORMAL LOW (ref >60)
GFR calc non Af Amer: 27 mL/min — ABNORMAL LOW (ref >60)
Glucose, Bld: 93 mg/dL (ref 70–99)
Potassium: 3.1 mmol/L — ABNORMAL LOW (ref 3.5–5.1)
Sodium: 145 mmol/L (ref 135–145)

## 2018-03-03 LAB — CBC
HCT: 23.6 % — ABNORMAL LOW (ref 36.0–46.0)
HCT: 26 % — ABNORMAL LOW (ref 36.0–46.0)
HEMOGLOBIN: 7.5 g/dL — AB (ref 12.0–15.0)
Hemoglobin: 8.4 g/dL — ABNORMAL LOW (ref 12.0–15.0)
MCH: 27 pg (ref 26.0–34.0)
MCH: 26.5 pg (ref 26.0–34.0)
MCHC: 31.8 g/dL (ref 30.0–36.0)
MCHC: 32.3 g/dL (ref 30.0–36.0)
MCV: 84.9 fL (ref 80.0–100.0)
MCV: 82 fL (ref 80.0–100.0)
NRBC: 0 % (ref 0.0–0.2)
Platelets: 231 10*3/uL (ref 150–400)
Platelets: 227 10*3/uL (ref 150–400)
RBC: 2.78 MIL/uL — ABNORMAL LOW (ref 3.87–5.11)
RBC: 3.17 MIL/uL — ABNORMAL LOW (ref 3.87–5.11)
RDW: 16.5 % — ABNORMAL HIGH (ref 11.5–15.5)
RDW: 15.8 % — ABNORMAL HIGH (ref 11.5–15.5)
WBC: 10.6 10*3/uL — ABNORMAL HIGH (ref 4.0–10.5)
WBC: 9.2 10*3/uL (ref 4.0–10.5)
nRBC: 0 % (ref 0.0–0.2)

## 2018-03-03 LAB — PREPARE RBC (CROSSMATCH)

## 2018-03-03 LAB — IRON AND TIBC
Iron: 39 ug/dL (ref 28–170)
Saturation Ratios: 19 % (ref 10.4–31.8)
TIBC: 206 ug/dL — ABNORMAL LOW (ref 250–450)
UIBC: 167 ug/dL

## 2018-03-03 LAB — OCCULT BLOOD X 1 CARD TO LAB, STOOL: Fecal Occult Bld: POSITIVE — AB

## 2018-03-03 LAB — VITAMIN B12: Vitamin B-12: 412 pg/mL (ref 180–914)

## 2018-03-03 LAB — FERRITIN: Ferritin: 265 ng/mL (ref 11–307)

## 2018-03-03 MED ORDER — POTASSIUM CHLORIDE CRYS ER 20 MEQ PO TBCR
40.0000 meq | EXTENDED_RELEASE_TABLET | Freq: Once | ORAL | Status: AC
  Administered 2018-03-03: 40 meq via ORAL
  Filled 2018-03-03: qty 2

## 2018-03-03 MED ORDER — SODIUM CHLORIDE 0.9% IV SOLUTION
Freq: Once | INTRAVENOUS | Status: AC
  Administered 2018-03-03: 15:00:00 via INTRAVENOUS

## 2018-03-03 NOTE — Progress Notes (Addendum)
Progress Note  Patient Name: [name] Date of Encounter: 03/03/2018  Primary Cardiologist: Larae Grooms, MD    Subjective   No chest pain, shortness of breath.  She denies bloody stools, vaginal bleeding, hematuria.  Inpatient Medications    Scheduled Meds: . sodium chloride   Intravenous Once  . aspirin  81 mg Oral Daily  . atorvastatin  80 mg Oral q1800  . carvedilol  6.25 mg Oral BID WC  . chlorhexidine  15 mL Mouth Rinse BID  . levETIRAcetam  1,000 mg Oral Daily  . mouth rinse  15 mL Mouth Rinse q12n4p  . pantoprazole (PROTONIX) IV  40 mg Intravenous Q12H  . potassium chloride  40 mEq Oral Once  . sodium chloride flush  3 mL Intravenous Q12H  . ticagrelor  90 mg Oral BID   Continuous Infusions: . sodium chloride    . sodium chloride Stopped (03/01/18 1711)   PRN Meds: sodium chloride, acetaminophen, hydrALAZINE, ondansetron (ZOFRAN) IV, ondansetron, sodium chloride flush   Vital Signs    Vitals:   03/03/18 0410 03/03/18 0510 03/03/18 0523 03/03/18 0750  BP: 122/68 115/69 115/69 120/61  Pulse:   90   Resp:   14   Temp:   98.2 F (36.8 C)   TempSrc:   Oral   SpO2:   99%   Weight:   48 kg   Height:        Intake/Output Summary (Last 24 hours) at 03/03/2018 1225 Last data filed at 03/03/2018 0853 Gross per 24 hour  Intake -  Output 900 ml  Net -900 ml   Last 3 Weights 03/03/2018 02/28/2018 10/08/2017  Weight (lbs) 105 lb 14.4 oz 111 lb 8.8 oz 108 lb 4 oz  Weight (kg) 48.036 kg 50.6 kg 49.102 kg      Telemetry    Normal sinus rhythm  - Personally Reviewed  ECG     Physical Exam   GEN: No acute distress.   Neck: No JVD Cardiac: RRR, 3/6 holosystolic murmur LLSB  Respiratory: Clear to auscultation bilaterally. GI: Soft, nontender, non-distended  MS: No edema; No deformity. Neuro:  Nonfocal  Psych: Normal affect   Labs    Chemistry Recent Labs  Lab 02/28/18 1156 03/02/18 0324 03/03/18 0538  NA 145 151* 145  K 3.9 3.6 3.1*    CL 117* 125* 120*  CO2 18* 17* 18*  GLUCOSE 94 112* 93  BUN 24* 42* 36*  CREATININE 1.66* 1.77* 1.78*  CALCIUM 8.0* 8.2* 8.3*  GFRNONAA 29* 27* 27*  GFRAA 34* 31* 31*  ANIONGAP 10 9 7      Hematology Recent Labs  Lab 02/28/18 0002  02/28/18 0538 03/02/18 0324 03/03/18 0538  WBC 11.8*  --  15.8*  --  10.6*  RBC 4.55  --  4.50  --  2.78*  HGB 12.5   < > 12.1 8.4* 7.5*  HCT 38.8   < > 36.9 26.0* 23.6*  MCV 85.3  --  82.0  --  84.9  MCH 27.5  --  26.9  --  27.0  MCHC 32.2  --  32.8  --  31.8  RDW 15.8*  --  15.7*  --  16.5*  PLT 300  --  267  --  231   < > = values in this interval not displayed.    Cardiac Enzymes Recent Labs  Lab 03/01/18 0909  TROPONINI 6.05*    Recent Labs  Lab 02/27/18 2356  TROPIPOC 0.01  BNPNo results for input(s): BNP, PROBNP in the last 168 hours.   DDimer No results for input(s): DDIMER in the last 168 hours.   Radiology    Dg Chest 1 View  Result Date: 03/02/2018 CLINICAL DATA:  Coughing blood EXAM: CHEST  1 VIEW COMPARISON:  February 28, 2018 FINDINGS: The heart size and mediastinal contours are within normal limits. The aorta is slightly uncoiled. There is no focal infiltrate, pulmonary edema, or pleural effusion. The visualized skeletal structures are stable. IMPRESSION: No active cardiopulmonary disease. Electronically Signed   By: Abelardo Diesel M.D.   On: 03/02/2018 02:41   Dg Abd Portable 1v  Result Date: 03/02/2018 CLINICAL DATA:  Nasogastric tube placement. EXAM: PORTABLE ABDOMEN - 1 VIEW COMPARISON:  None. FINDINGS: The bowel gas pattern is normal. Nasogastric tube is identified distal tip in the proximal to mid stomach. Prior cholecystectomy clips are noted. Moderate bowel content is identified in the colon. IMPRESSION: Nasogastric tube is identified distal tip in the proximal to mid stomach. Electronically Signed   By: Abelardo Diesel M.D.   On: 03/02/2018 01:38    Cardiac Studies   Echo 03/01/18  1. The left ventricle  has hyperdynamic systolic function, with an ejection fraction of >65%. The cavity size was normal. Severe basal septal hypertrophy of the septal wall. Left ventricular diastolic Doppler parameters are consistent with impaired  relaxation Elevated left ventricular end-diastolic pressure The E/e' is 26.2. No evidence of left ventricular regional wall motion abnormalities.  2. There is dynamic mid cavitary LV obstruction at rest up to 63mmHg.  3. No evidence of left ventricular regional wall motion abnormalities.  4. The right ventricle has normal systolic function. The cavity was normal. There is no increase in right ventricular wall thickness.  5. The anterior MV leaflet appears thickened at the tip. Cannot rule out myxomatous degeneration. Mitral valve regurgitation is moderate by color flow Doppler. The MR jet is eccentric laterally directed.  6. Consider TEE for further evaluation of MV.  7. The tricuspid valve is normal in structure.  8. The aortic valve has an indeterminant number of cusps Moderate calcification of the aortic valve. mild stenosis of the aortic valve.  9. The pulmonic valve was normal in structure. 10. The aortic root and ascending aorta are normal in size and structure. 11. Right atrial pressure is estimated at 3 mmHg.  Left Heart Cath 02/28/2018 LAD irregs; D2 ost 50, 40 LCx irregs RCA prox 100 EF 55-65 PCI: 2.5 x 38 mm Synergy DES to prox RCA       Patient Profile     78 y.o. female with CKD and RQ who presented to the ED via EMS for an inferior STEMI. She was subsequently intubated for airway protection and PCI was performed.   Assessment & Plan    1. CAD: Inferior STEMI s/p DES to RCA.  Echo with EF > 65%, LV mid-cavity gradient, normal RV, moderate MR.  - Continue DAPT with Ticagrelor 90 mg QD and ASA 81 mg QD.  - Continue atorvastatin 80 mg QD - Coreg 6.25 mg bid.   - Renal function remains elevated. Continue to follow. Start ACE/ARB once improved.   2.  AKI on CKD stage 3: Creatinine baseline around 1.3-1.4, now up to 1.78.  No further diuretics needed (euvolemic) and holding off on ACEI/ARB for now.  3. Hypokalemia - K+ low.  Will replace. 4. Intubated for airway protection: Extubated 2/14 and stable. CXR clear.   5. Hypernatremia:  Sodium  level now normal. 6. Anemia - Hgb worse (12.1 >> 8.4 >> 7.5).   - check stools for hemoccult - Obtain Iron studies - D/w Dr. Aundra Dubin >> will transfuse 1 u PRBCs - CBC, BMET tomorrow AM   For questions or updates, please contact [place] Please consult [email or web address] for contact info under      Signed, Richardson Dopp, PA-C  24-Mar-2018, 12:25 PM     Patient seen with PA, agree with the above note.   She has no chest pain or dyspnea.  Hgb noted to be down to 7.5.  No overt bleeding noted so far.  - She just had PCI for STEMI, so will continue ticagrelor and ASA.   - Will give 1 unit PRBCs.  - Guaiac stool, send Fe studies and B12.   Loralie Champagne 2018-03-24

## 2018-03-03 NOTE — Progress Notes (Signed)
   Called by RN Patient had large black stool. VS stable.  No complaints. Stool is heme +.  PLAN:  1. Transfuse 1 u PRBCs. 2. Check H/H 4 hours post transfusion. 3. I have called for GI consult.  Richardson Dopp, PA-C    03/03/2018 4:33 PM

## 2018-03-03 NOTE — Progress Notes (Signed)
Patient had a large black stool this afternoon. I sent it for Occult. It came back positive. I sent a text to Richardson Dopp, PA

## 2018-03-03 NOTE — Progress Notes (Signed)
Patients husband came in this afternoon and he stated desire for patient to go to a rehab facility after discharge. I put in a social worker consult for them to speak with husband about disposition

## 2018-03-03 NOTE — Evaluation (Signed)
Physical Therapy Evaluation Patient Details Name: [name] MRN: [id number] DOB: [birth year] Today's Date: 03/03/2018   History of Present Illness  [name] is a 78 y.o. 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 Underwent cath with DES to RCA  Clinical Impression  Pt admitted with above diagnosis. Pt currently with functional limitations due to the deficits listed below (see PT Problem List). Pt presents with weakness and fatigue. Able to sit up in bed but needed mod A to scoot to EOB to get feet on floor. Min A for sit to stand and pivot to recliner. Had difficulty moving feet in standing and unable to walk any significant distance today. Seems to have good support at home and would benefit from therapy coming in.  Pt will benefit from skilled PT to increase their independence and safety with mobility to allow discharge to the venue listed below.       Follow Up Recommendations Home health PT;Supervision for mobility/OOB    Equipment Recommendations  None recommended by PT    Recommendations for Other Services OT consult     Precautions / Restrictions Precautions Precautions: Fall Restrictions Weight Bearing Restrictions: No      Mobility  Bed Mobility Overal bed mobility: Needs Assistance Bed Mobility: Supine to Sit     Supine to sit: Supervision;Mod assist     General bed mobility comments: able to sit straight up in bed into long sitting demonstrating good core strength but had difficulty sliding LE's off bed and then could not scoot self to EOB with hips and UE, needed mod A to scoot fwd.   Transfers Overall transfer level: Needs assistance Equipment used: 1 person hand held assist Transfers: Sit to/from [facility] Sit to Stand: Min assist Stand pivot transfers: Min assist       General transfer comment: posterior lean when trying to stand, needed min A for initiation as well as fwd wt  shift. Had difficulty stepping feet to walk to recliner, min A for support while she took small steps  Ambulation/Gait             General Gait Details: unable today due to fa tigue  Science writer    Modified Rankin (Stroke Patients Only)       Balance Overall balance assessment: Needs assistance Sitting-balance support: No upper extremity supported Sitting balance-Leahy Scale: Fair     Standing balance support: Single extremity supported Standing balance-Leahy Scale: Poor Standing balance comment: required UE support to stand                             Pertinent Vitals/Pain Pain Assessment: No/denies pain    Home Living Family/patient expects to be discharged to:: Private residence Living Arrangements: Spouse/significant other;Other relatives Available Help at Discharge: Family;Available PRN/intermittently Type of Home: House Home Access: Stairs to enter Entrance Stairs-Rails: Right Entrance Stairs-Number of Steps: 3 Home Layout: One level Home Equipment: Walker - 2 wheels;Cane - single point;Bedside commode Additional Comments: pt lives with her husband (who works) and 21 yo grandson    Prior Function Level of Independence: Needs Water engineer / Transfers Assistance Needed: ambulates with cane, does not ambulate when home alone  ADL's / Homemaking Assistance Needed: granddaughter assists with bathing  Comments: pt gives some info but not very thorough historian, STM deficits evident  Hand Dominance        Extremity/Trunk Assessment   Upper Extremity Assessment Upper Extremity Assessment: Generalized weakness    Lower Extremity Assessment Lower Extremity Assessment: Generalized weakness    Cervical / Trunk Assessment Cervical / Trunk Assessment: Kyphotic  Communication   Communication: No difficulties  Cognition Arousal/Alertness: Awake/alert Behavior During Therapy: Flat affect Overall  Cognitive Status: No family/caregiver present to determine baseline cognitive functioning Area of Impairment: Memory;Problem solving;Orientation                 Orientation Level: Disoriented to;Time   Memory: Decreased short-term memory       Problem Solving: Slow processing;Decreased initiation;Requires verbal cues General Comments: pt oriented to person and place but not time (thought it was January). Slow processing. Per notes from last admission had same issues, may be her baseline.       General Comments      Exercises Total Joint Exercises Bridges: AROM;Supine;5 reps General Exercises - Lower Extremity Ankle Circles/Pumps: AROM;Both;10 reps;Seated Long Arc Quad: AROM;Both;10 reps;Seated   Assessment/Plan    PT Assessment Patient needs continued PT services  PT Problem List Decreased strength;Decreased activity tolerance;Decreased balance;Decreased mobility;Decreased cognition;Decreased knowledge of precautions       PT Treatment Interventions DME instruction;Gait training;Stair training;Functional mobility training;Therapeutic activities;Therapeutic exercise;Balance training;Neuromuscular re-education;Cognitive remediation;Patient/family education    PT Goals (Current goals can be found in the Care Plan section)  Acute Rehab PT Goals Patient Stated Goal: return home PT Goal Formulation: With patient Time For Goal Achievement: 03/17/18 Potential to Achieve Goals: Good    Frequency Min 3X/week   Barriers to discharge        Co-evaluation               AM-PAC PT "6 Clicks" Mobility  Outcome Measure Help needed turning from your back to your side while in a flat bed without using bedrails?: A Little Help needed moving from lying on your back to sitting on the side of a flat bed without using bedrails?: A Lot Help needed moving to and from a bed to a chair (including a wheelchair)?: A Lot Help needed standing up from a chair using your arms (e.g.,  wheelchair or bedside chair)?: A Lot Help needed to walk in hospital room?: A Lot Help needed climbing 3-5 steps with a railing? : Total 6 Click Score: 12    End of Session Equipment Utilized During Treatment: Gait belt Activity Tolerance: Patient limited by fatigue Patient left: in chair;with call bell/phone within reach Nurse Communication: Mobility status PT Visit Diagnosis: Difficulty in walking, not elsewhere classified (R26.2);Muscle weakness (generalized) (M62.81);Unsteadiness on feet (R26.81)    Time: 4599-7741 PT Time Calculation (min) (ACUTE ONLY): 26 min   Charges:   PT Evaluation $PT Eval Moderate Complexity: 1 Mod PT Treatments $Neuromuscular Re-education: 8-22 mins        Leighton Roach, PT  Acute Rehab Services  Pager [phone] Office [place] 03/03/2018, 10:17 AM

## 2018-03-04 ENCOUNTER — Ambulatory Visit: Payer: Medicare PPO | Admitting: Internal Medicine

## 2018-03-04 DIAGNOSIS — Z7902 Long term (current) use of antithrombotics/antiplatelets: Secondary | ICD-10-CM

## 2018-03-04 DIAGNOSIS — K921 Melena: Secondary | ICD-10-CM

## 2018-03-04 DIAGNOSIS — D500 Iron deficiency anemia secondary to blood loss (chronic): Secondary | ICD-10-CM

## 2018-03-04 LAB — TYPE AND SCREEN
ABO/RH(D): B NEG
ANTIBODY SCREEN: NEGATIVE
Unit division: 0

## 2018-03-04 LAB — FOLATE RBC
FOLATE, RBC: 847 ng/mL (ref >498)
Folate, Hemolysate: 205 ng/mL
Hematocrit: 24.2 % — ABNORMAL LOW (ref 34.0–46.6)

## 2018-03-04 LAB — BPAM RBC
Blood Product Expiration Date: 202002252359
ISSUE DATE / TIME: 202002161447
Unit Type and Rh: 1700

## 2018-03-04 LAB — BASIC METABOLIC PANEL
Anion gap: 9 (ref 5–15)
BUN: 30 mg/dL — ABNORMAL HIGH (ref 8–23)
CHLORIDE: 119 mmol/L — AB (ref 98–111)
CO2: 16 mmol/L — ABNORMAL LOW (ref 22–32)
Calcium: 8.1 mg/dL — ABNORMAL LOW (ref 8.9–10.3)
Creatinine, Ser: 1.74 mg/dL — ABNORMAL HIGH (ref 0.44–1.00)
GFR calc Af Amer: 32 mL/min — ABNORMAL LOW (ref >60)
GFR calc non Af Amer: 27 mL/min — ABNORMAL LOW (ref >60)
Glucose, Bld: 94 mg/dL (ref 70–99)
Potassium: 3.6 mmol/L (ref 3.5–5.1)
SODIUM: 144 mmol/L (ref 135–145)

## 2018-03-04 LAB — CBC
HCT: 29.7 % — ABNORMAL LOW (ref 36.0–46.0)
HEMOGLOBIN: 9.4 g/dL — AB (ref 12.0–15.0)
MCH: 26.8 pg (ref 26.0–34.0)
MCHC: 31.6 g/dL (ref 30.0–36.0)
MCV: 84.6 fL (ref 80.0–100.0)
Platelets: 254 10*3/uL (ref 150–400)
RBC: 3.51 MIL/uL — ABNORMAL LOW (ref 3.87–5.11)
RDW: 16.4 % — ABNORMAL HIGH (ref 11.5–15.5)
WBC: 10 10*3/uL (ref 4.0–10.5)
nRBC: 0 % (ref 0.0–0.2)

## 2018-03-04 MED ORDER — POTASSIUM CHLORIDE CRYS ER 20 MEQ PO TBCR
20.0000 meq | EXTENDED_RELEASE_TABLET | Freq: Once | ORAL | Status: AC
  Administered 2018-03-04: 20 meq via ORAL
  Filled 2018-03-04: qty 1

## 2018-03-04 NOTE — Clinical Social Work Note (Signed)
Clinical Social Work Assessment  Patient Details  Name: [name] MRN: [id number] Date of Birth: Mar 07, 1940  Date of referral:  03/04/18               Reason for consult:  Facility Placement, Discharge Planning                Permission sought to share information with:  Facility Sport and exercise psychologist, Family Supports Permission granted to share information::  Yes, Verbal Permission Granted  Name::     [name]  Agency::  [facility]  Relationship::  spouse  Contact Information:  [phone]  Housing/Transportation Living arrangements for the past 2 months:  Single Family Home Source of Information:  Patient, Spouse Patient Interpreter Needed:  None Criminal Activity/Legal Involvement Pertinent to Current Situation/Hospitalization:  No - Comment as needed Significant Relationships:  Spouse, Adult Children Lives with:  Spouse Do you feel safe going back to the place where you live?  Yes Need for family participation in patient care:  Yes (Comment)  Care giving concerns: Patient from home with spouse. PT recommending home health. However, CSW was consulted for family request for SNF.   Social Worker assessment / plan: CSW met with patient at bedside. Patient alert and oriented. CSW introduced self and role and discussed dispsoition planning.   Patient indicated that she and her husband had discussed the possibility of SNF. Patient indicated that at baseline, her family helps her with some ADLs. Patient agreeable to SNF if needed. She gave permission to discuss disposition plan with her husband.  CSW spoke to husband, [name], on the phone. Husband indicated he thought the therapy that was recommended was outpatient and patient would have to leave home three times per week to get therapy at a rehab clinic. Husband stated that if she can get therapy at home, they would prefer home health over SNF. Husband and patient's granddaughter will provide support.  Referred back to [facility] for home  health needs. CSW will sign off.  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  Patient/Family's Understanding of and Emotional Response to Diagnosis, Current Treatment, and Prognosis: Patient and family with understanding of patient's conditions and care needs. They are agreeable to home health.  Emotional Assessment Appearance:  Appears stated age Attitude/Demeanor/Rapport:  Engaged Affect (typically observed):  Accepting, Calm, Pleasant, Appropriate Orientation:  Oriented to Self, Oriented to Place, Oriented to  Time, Oriented to Situation Alcohol / Substance use:  Not Applicable Psych involvement (Current and /or in the community):  No (Comment)  Discharge Needs  Concerns to be addressed:  Discharge Planning Concerns, Care Coordination Readmission within the last 30 days:  No Current discharge risk:  Physical Impairment Barriers to Discharge:  Continued Medical Work up, [place], [place] 03/04/2018, 1:18 PM

## 2018-03-04 NOTE — Care Management Note (Addendum)
Case Management Note  Patient Details  Name: [name] MRN: [id number] Date of Birth: May 04, 1940  Subjective/Objective: Pt presented for St[facility]- post pci- initiated on Brilinta. Patient will get first supply via [place]. PTA from home with support of husband and granddaughter. Plan will be to return home with [place]. Husband declined SNF states granddaughter will be in the home with patient. Medicare.Gov List provided and husband chose [name] for [employer].                    Action/Plan: CM did make referral with Tommi Rumps with [name] - SOC to begin within 24-48 hours post transition home. CM did speak with patient and husband about [facility]- patient is agreeable to [facility] services. Bobbie with [facility] to speak with patient. No further needs from CM at this time.   Expected Discharge Date:                  Expected Discharge Plan:  [place]  In-House Referral:  [facility]  Discharge planning Services  CM Consult, Medication Assistance(Brilinta benefits check)  Post Acute Care Choice:  Home Health Choice offered to:  Patient, Spouse  DME Arranged:  N/A DME Agency:  NA  HH Arranged:  RN, Disease Management, PT, Nurse's Aide [place] Agency:  [place]  Status of Service:  Completed, signed off  If discussed at [place] of Stay Meetings, dates discussed:    Additional Comments:  Bethena Roys, RN 03/04/2018, 12:24 PM

## 2018-03-04 NOTE — Care Management Important Message (Signed)
Important Message  Patient Details  Name: [name] MRN: [id number] Date of Birth: January 03, 1941   Medicare Important Message Given:  Yes    [name] Hendricks Schwandt 03/04/2018, 4:31 PM

## 2018-03-04 NOTE — Progress Notes (Signed)
CARDIAC REHAB PHASE I   PRE:  Rate/Rhythm: 76 SR    BP: sitting 126/85    SaO2: 99 RA  MODE:  Ambulation: to BSC then walked around bed to recliner   POST:  Rate/Rhythm: 98 SR    BP: sitting 124/65     SaO2: 94 RA  On arrival pt with stool in bed. RN, NT, and I assisted to [facility], cleaned, then pt ambulated to recliner 8 ft away. She stood with min assist and walked with RW. She moves feet very slowly, small steps. Struggles more with right than left. Weak. Sts she falls at home. Husband confirms this and also she rolls out of bed sometimes. Discussed MI, stent, Brilinta (no ASA), smoking cessation and CRPII with pt and husband. They voiced understanding and want to quit smoking together. Gave resources and suggestions. Pt with limited mobility and will benefit from HHPT for safety eval. Will refer to [facility] however she will probably not be able to do. Left in recliner on alarm, husband present.  [place], ACSM 03/04/2018 3:15 PM

## 2018-03-04 NOTE — Progress Notes (Addendum)
The patient has been seen in conjunction with Reino Bellis, NP. All aspects of care have been considered and discussed. The patient has been personally interviewed, examined, and all clinical data has been reviewed.   Digital images reviewed.  Patient has 38 mm of fresh drug-eluting stent in the proximal to mid RCA.  Very nice angiographic result.  Dual antiplatelet therapy aspirin and Brilinta.  GI bleeding with hemoglobin down to 7.5 requiring 1 unit of packed RBC transfusion.  Agree with GI work-up.  Under the circumstances, we should probably get rid of aspirin to decrease recurrent bleeding risk.  Anti-thrombotic protection is questionable in the setting of strong inhibition with ADP receptor/P2 Y 12 blockade from [place]..  Currently n.p.o. awaiting upper endoscopy.  Hemoglobin needs to be stable prior to discharge.  Progress Note  Patient Name: [name] Date of Encounter: 03/04/2018  Primary Cardiologist: Larae Grooms, MD   Subjective   Laying in bed, feels weak this morning, but better than yesterday. Doesn't think she's had any more blood in her stool.   Inpatient Medications    Scheduled Meds: . aspirin  81 mg Oral Daily  . atorvastatin  80 mg Oral q1800  . carvedilol  6.25 mg Oral BID WC  . chlorhexidine  15 mL Mouth Rinse BID  . levETIRAcetam  1,000 mg Oral Daily  . mouth rinse  15 mL Mouth Rinse q12n4p  . pantoprazole (PROTONIX) IV  40 mg Intravenous Q12H  . sodium chloride flush  3 mL Intravenous Q12H  . ticagrelor  90 mg Oral BID   Continuous Infusions: . sodium chloride    . sodium chloride Stopped (03/01/18 1711)   PRN Meds: sodium chloride, acetaminophen, hydrALAZINE, ondansetron (ZOFRAN) IV, ondansetron, sodium chloride flush   Vital Signs    Vitals:   03/03/18 1629 03/03/18 1744 03/03/18 1946 03/04/18 0422  BP: 134/80  118/73 125/73  Pulse:   95 83  Resp: 18  (!) 21 16  Temp:  97.7 F (36.5 C) 99.1 F (37.3 C) 98.2 F (36.8  C)  TempSrc:  Oral Oral Oral  SpO2:   100% 100%  Weight:    48.4 kg  Height:        Intake/Output Summary (Last 24 hours) at 03/04/2018 0829 Last data filed at 03/04/2018 0500 Gross per 24 hour  Intake 1136 ml  Output 500 ml  Net 636 ml   Last 3 Weights 03/04/2018 03/03/2018 02/28/2018  Weight (lbs) 106 lb 9.6 oz 105 lb 14.4 oz 111 lb 8.8 oz  Weight (kg) 48.353 kg 48.036 kg 50.6 kg      Telemetry    SR - Personally Reviewed  ECG    N/a - Personally Reviewed  Physical Exam   GEN: No acute distress.   Neck: No JVD Cardiac: RRR, 2/6 systolic murmurs, rubs, or gallops.  Respiratory: Clear to auscultation bilaterally. GI: Soft, nontender, non-distended  MS: No edema; No deformity. Right forearm with bruising from cath site. No hematoma. Neuro:  Nonfocal  Psych: Normal affect   Labs    Chemistry Recent Labs  Lab 03/02/18 0324 03/03/18 0538 03/04/18 0528  NA 151* 145 144  K 3.6 3.1* 3.6  CL 125* 120* 119*  CO2 17* 18* 16*  GLUCOSE 112* 93 94  BUN 42* 36* 30*  CREATININE 1.77* 1.78* 1.74*  CALCIUM 8.2* 8.3* 8.1*  GFRNONAA 27* 27* 27*  GFRAA 31* 31* 32*  ANIONGAP 9 7 9      Hematology Recent Labs  Lab 03/03/18 0538 03/03/18 2239 03/04/18 0528  WBC 10.6* 9.2 10.0  RBC 2.78* 3.17* 3.51*  HGB 7.5* 8.4* 9.4*  HCT 23.6* 26.0* 29.7*  MCV 84.9 82.0 84.6  MCH 27.0 26.5 26.8  MCHC 31.8 32.3 31.6  RDW 16.5* 15.8* 16.4*  PLT 231 227 254    Cardiac Enzymes Recent Labs  Lab 03/01/18 0909  TROPONINI 6.05*    Recent Labs  Lab 02/27/18 2356  TROPIPOC 0.01     BNPNo results for input(s): BNP, PROBNP in the last 168 hours.   DDimer No results for input(s): DDIMER in the last 168 hours.   Radiology    No results found.  Cardiac Studies   Cath: 02/28/2018   Prox RCA lesion is 100% stenosed.  A drug-eluting stent was successfully placed using a STENT SYNERGY DES 2.5X38, postdilated to > 3.0 mm.  Post intervention, there is a 0% residual  stenosis.  2nd Diag lesion is 40% stenosed.  Ost 2nd Diag lesion is 50% stenosed.  The left ventricular systolic function is normal.  LV end diastolic pressure is normal.  The left ventricular ejection fraction is 55-65% by visual estimate.  There is no aortic valve stenosis.  A drug-eluting stent was successfully placed using a STENT SYNERGY DES 2.5X38.   Continue dual antiplatelet therapy along with aggressive secondary prevention.  Hopefully, she can be extubated later today.    I anticipate she will be in the hospital for 2-3 days.  Check echocardiogram to assess for mitral regurgitation.   Hematoma noted in the right wrist/forearm.  All anticoagulation has been stopped.  Continue compression.   TTE: 03/01/2018   1. The left ventricle has hyperdynamic systolic function, with an ejection fraction of >65%. The cavity size was normal. Severe basal septal hypertrophy of the septal wall. Left ventricular diastolic Doppler parameters are consistent with impaired  relaxation Elevated left ventricular end-diastolic pressure The E/e' is 26.2. No evidence of left ventricular regional wall motion abnormalities.  2. There is dynamic mid cavitary LV obstruction at rest up to 44mmHg.  3. No evidence of left ventricular regional wall motion abnormalities.  4. The right ventricle has normal systolic function. The cavity was normal. There is no increase in right ventricular wall thickness.  5. The anterior MV leaflet appears thickened at the tip. Cannot rule out myxomatous degeneration. Mitral valve regurgitation is moderate by color flow Doppler. The MR jet is eccentric laterally directed.  6. Consider TEE for further evaluation of MV.  7. The tricuspid valve is normal in structure.  8. The aortic valve has an indeterminant number of cusps Moderate calcification of the aortic valve. mild stenosis of the aortic valve.  9. The pulmonic valve was normal in structure. 10. The aortic root and  ascending aorta are normal in size and structure. 11. Right atrial pressure is estimated at 3 mmHg.   Patient Profile     78 y.o. female with CKD and RQ who presented to the ED via EMS for an inferior STEMI. She was subsequently intubated for airway protection and PCI was performed.   Assessment & Plan    1. STEMI: s/p DES to RCA.  Echo with EF > 65%, LV mid-cavity gradient, normal RV, moderate MR. Placed on DAPT with Ticagrelor 90 mg QD and ASA 81 mg QD.  - Continue atorvastatin 80 mg QD, Coreg 6.25 mg bid.   - Renal function remains elevated. Continue to follow. No room for ACEI/ARB at this time.  2. AKI on  CKD stage 3: Creatinine baseline around 1.3-1.4, now up to 1.74. No further diuretics needed (euvolemic) and holding off on ACEI/ARB for now.   3. Hypokalemia: K+ 3.6 today. Will supplement again today.   4.Intubated for airway protection: Extubated 2/14 and stable. CXR clear.  5. Hypernatremia:  resolved.   6. Anemia: Hgb dropped to 7.5 yesterday with dark stools and FOBT +. Given 1 unit PRBC with improved to 9.4 today. GI consult requested yesterday, pending. - Iron studies ok. - follow CBC closely with need for DAPT.   7. Deconditioned: PT working with her. Notes indicate husband would like rehab at discharge.   For questions or updates, please contact [place] Please consult [email or web address] for contact info under        Signed, Reino Bellis, NP  03/04/2018, 8:29 AM

## 2018-03-04 NOTE — Progress Notes (Signed)
Patient's husband contacted via phone by Tye Savoy NP; EGD procedure explained to husband as well as to patient, and husband gave verbal consent over the phone for procedure which was witnessed by this RN. However patient's husband states that he should be coming up to floor today and can probably sign paper consent.

## 2018-03-04 NOTE — Consult Note (Signed)
   [facility] Inpatient Consult   03/04/2018  [name] 1940/11/27 [id number]  Patient screened for potential [facility] Care Management services.  Patient had been engaged by [place] worker in October 2019 for assistance with transportation and meals.   Went to bedside to speak with [name] and her husband about need for community [facility] Care Management Services. Patient and spouse state that would like some assistance with transportation and meals when she returns home. Husband states that [name] had meals delivered for a few weeks last year, but then they stopped.   Patient states that has support from granddaughter and spouse but needs assistance with getting some form of medical transport to appointments.  Referral will be placed for [place] Worker follow up. Consent form signed.  For questions, please contact:  Netta Cedars, MSN, [facility] Liaison Nurse Mobile Phone [phone]  Toll free office [phone]

## 2018-03-04 NOTE — Consult Note (Addendum)
Referring Provider:  [facility] Primary Care Physician:  Glendale Chard, MD Primary Gastroenterologist:   unassigned     Reason for Consultation:   GI bleed    ASSESSMENT / PLAN:    31. 78 yo female with acute anemia / GI bleed on DAPT following STEMI with PCI this admission. Presumably upper bleed with passage of black, hemocult positve stool yesterday.  -Baby asa and Aleve on home med list. She is not a strong historian so not sure if really taking Aleve.  -continue BID IV PPI -monitor H+H -suspect she will need EGD (on Brillinta). I would like to discuss procedure with her husband.   2. Acute blood loss anemia. Hgb 12.1 >>> 8.4 >>> 7.5 -received a unit of pRBC yesterday, hgb up to 9.4 today. She is normocytic. Probably not iron deficient but iron studies pending -no melenic stools today per RN.   3. AKI on CKD, slowly improving.      Attending Physician Note   I have taken a history, examined the patient and reviewed the chart. I agree with the Advanced Practitioner's note, impression and recommendations.   Melena, heme + stool  ABL anemia  S/P STEMI with DES to RCA on Brilinta. AKI on CKD 3  Trend CBC  PPI bid IV Diagnostic EGD tomorrow. Spoke with patient and her husband at beside. The risks (including bleeding, perforation, infection, missed lesions, medication reactions and possible hospitalization or surgery if complications occur), benefits, and alternatives to endoscopy with possible biopsy and possible dilation were discussed with the patient and they consent to proceed.    Lucio Edward, MD [facility] [phone]     HPI:      HPI: [name] is a 78 y.o. 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 She was intubated in ED due to concern for airway compromise with vomiting and AMS. She was treated with IVF, ASA, IV heparin. Taken for cath, had 100% occulsion of prox  RCA s/p DES.   Patient is not a strong historian but denies abdominal pain, blood in stool at home. Reports having had a couple of colonoscopies in the last few years on [place]. I called [place], she hasn't been seen there. Her husband is involved in care but not in room right now.     Past Medical History:  Diagnosis Date  . Acute kidney injury superimposed on chronic kidney disease ([place]) 02/06/2015  . Anxiety   . Depression   . DVT (deep venous thrombosis) ([facility]) 1970s   LLE  . Heart murmur   . Hypercholesterolemia   . Hypertension   . Kidney stones   . Migraine    "a few/year" (07/16/2015)  . Multiple falls   . Pneumonia "several times"  . Rheumatoid arthritis(714.0)    "all over" (07/16/2015)  . Seizure [facility])    "last one was 1//2017" (07/16/2015)  . Syncope and collapse   . Vision abnormalities     Past Surgical History:  Procedure Laterality Date  . ABDOMINAL HYSTERECTOMY    . CARPAL TUNNEL RELEASE Bilateral   . CATARACT EXTRACTION W/ INTRAOCULAR LENS  IMPLANT, BILATERAL Bilateral   . CORONARY/GRAFT ACUTE MI REVASCULARIZATION N/A 02/28/2018   Procedure: Coronary/Graft Acute MI Revascularization;  Surgeon: Jettie Booze, MD;  Location: [facility];  Service: Cardiovascular;  Laterality: N/A;  . LEFT HEART CATH AND CORONARY ANGIOGRAPHY N/A 02/28/2018   Procedure:  LEFT HEART CATH AND CORONARY ANGIOGRAPHY;  Surgeon: Jettie Booze, MD;  Location: [facility];  Service: Cardiovascular;  Laterality: N/A;  . NECK EXPLORATION  07/16/2015  . PARATHYROIDECTOMY Right 07/16/2015   superior  . PARATHYROIDECTOMY Right 07/16/2015   Procedure: RIGHT INFERIOR PARATHYROIDECTOMY;  Surgeon: Armandina Gemma, MD;  Location: [facility];  Service: General;  Laterality: Right;  . TOENAIL AVULSION Bilateral    great toe    Prior to Admission medications   Medication Sig Start Date End Date Taking? Authorizing Provider  aspirin EC 81 MG tablet Take 81 mg by mouth daily.   Yes  [provider]  carvedilol (COREG) 6.25 MG tablet Take 1 tablet (6.25 mg total) by mouth 2 (two) times daily with a meal. 09/23/16  Yes Julianne Rice, MD  hydroxychloroquine (PLAQUENIL) 200 MG tablet Take 200 mg by mouth daily.  08/02/15  Yes [provider]  levETIRAcetam (KEPPRA) 500 MG tablet Take 2 tablets (1,000 mg total) by mouth daily. 09/23/16  Yes Julianne Rice, MD  naproxen sodium (ALEVE) 220 MG tablet Take 220 mg by mouth as needed (pain).   Yes [provider]  Vitamin D, Ergocalciferol, (DRISDOL) 50000 units CAPS capsule Take 1 capsule (50,000 Units total) by mouth every 7 (seven) days. 08/14/16  Yes Hongalgi, Lenis Dickinson, MD  ondansetron (ZOFRAN) 4 MG tablet Take 1 tablet (4 mg total) by mouth every 8 (eight) hours as needed for nausea or vomiting. Patient not taking: Reported on 02/28/2018 09/23/16   Julianne Rice, MD    Current Facility-Administered Medications  Medication Dose Route Frequency Provider Last Rate Last Dose  . 0.9 %  sodium chloride infusion  250 mL Intravenous PRN Richardson Dopp T, PA-C      . 0.9 %  sodium chloride infusion   Intravenous Continuous Richardson Dopp T, PA-C   Stopped at 03/01/18 1711  . acetaminophen (TYLENOL) tablet 650 mg  650 mg Oral Q4H PRN Richardson Dopp T, PA-C      . aspirin chewable tablet 81 mg  81 mg Oral Daily Richardson Dopp T, PA-C   81 mg at 03/04/18 0847  . atorvastatin (LIPITOR) tablet 80 mg  80 mg Oral q1800 Richardson Dopp T, PA-C   80 mg at 03/03/18 1628  . carvedilol (COREG) tablet 6.25 mg  6.25 mg Oral BID WC Weaver, Scott T, PA-C   6.25 mg at 03/04/18 0846  . chlorhexidine (PERIDEX) 0.12 % solution 15 mL  15 mL Mouth Rinse BID Richardson Dopp T, PA-C   15 mL at 03/04/18 0847  . hydrALAZINE (APRESOLINE) injection 10-40 mg  10-40 mg Intravenous Q4H PRN Richardson Dopp T, PA-C   10 mg at 03/01/18 0948  . levETIRAcetam (KEPPRA) tablet 1,000 mg  1,000 mg Oral Daily Richardson Dopp T, PA-C   1,000 mg at 03/04/18 0846  .  MEDLINE mouth rinse  15 mL Mouth Rinse q12n4p Weaver, Scott T, PA-C   15 mL at 03/03/18 1629  . ondansetron (ZOFRAN) injection 4 mg  4 mg Intravenous Q6H PRN Weaver, Scott T, PA-C      . ondansetron (ZOFRAN) tablet 4 mg  4 mg Oral Q8H PRN Weaver, Scott T, PA-C      . pantoprazole (PROTONIX) injection 40 mg  40 mg Intravenous Q12H Weaver, Scott T, PA-C   40 mg at 03/04/18 0847  . sodium chloride flush (NS) 0.9 % injection 3 mL  3 mL Intravenous Q12H Weaver, Scott T, PA-C   3 mL at 03/04/18  1007  . sodium chloride flush (NS) 0.9 % injection 3 mL  3 mL Intravenous PRN Richardson Dopp T, PA-C      . ticagrelor (BRILINTA) tablet 90 mg  90 mg Oral BID Richardson Dopp T, PA-C   90 mg at 03/04/18 0847    Allergies as of 02/27/2018  . (No Known Allergies)    Family History  Problem Relation Age of Onset  . Cancer Father   . Prostate cancer Father   . Cancer Brother   . Heart disease Brother   . Miscarriages / [place] Brother   . Heart disease Mother   . Pneumonia Mother     Social History   Socioeconomic History  . Marital status: Married    Spouse name: Not on file  . Number of children: Not on file  . Years of education: Not on file  . Highest education level: Not on file  Occupational History  . Not on file  Social Needs  . Financial resource strain: Somewhat hard  . Food insecurity:    Worry: Never true    Inability: Never true  . Transportation needs:    Medical: Yes    Non-medical: No  Tobacco Use  . Smoking status: Current Every Day Smoker    Packs/day: 1.00    Years: 62.00    Pack years: 62.00    Types: Cigarettes  . Smokeless tobacco: Never Used  Substance and Sexual Activity  . Alcohol use: No  . Drug use: No  . Sexual activity: Never  Lifestyle  . Physical activity:    Days per week: Not on file    Minutes per session: Not on file  . Stress: Not on file  Relationships  . Social connections:    Talks on phone: Not on file    Gets together: Not on file     Attends religious service: Not on file    Active member of club or organization: Not on file    Attends meetings of clubs or organizations: Not on file    Relationship status: Not on file  . Intimate partner violence:    Fear of current or ex partner: Not on file    Emotionally abused: Not on file    Physically abused: Not on file    Forced sexual activity: Not on file  Other Topics Concern  . Not on file  Social History Narrative  . Not on file    Review of Systems: All systems reviewed and negative except where noted in HPI.  Physical Exam: Vital signs in last 24 hours: Temp:  [97.4 F (36.3 C)-99.1 F (37.3 C)] 98.2 F (36.8 C) (02/17 0422) Pulse Rate:  [83-95] 83 (02/17 0422) Resp:  [13-21] 16 (02/17 0422) BP: (100-134)/(59-80) 115/66 (02/17 0846) SpO2:  [99 %-100 %] 100 % (02/17 0422) Weight:  [48.4 kg] 48.4 kg (02/17 0422) Last BM Date: 03/03/18 General:   Alert, thin fe,ale in NAD Psych:   cooperative. Flat affect. Eyes:  Pupils equal, sclera clear, no icterus.   Conjunctiva pink. Ears:  Normal auditory acuity. Nose:  No deformity, discharge,  or lesions. Neck:  Supple; no masses Lungs:  Clear throughout to auscultation.   No wheezes, crackles, or rhonchi.  Heart:  Regular rate and rhythm; murmur present, no lower extremity edema Abdomen:  Soft, non-distended, nontender, BS active, no palp mass    Rectal:  Deferred  Msk:  Symmetrical without gross deformities. . Neurologic:  Alert and  oriented x4;  grossly  normal neurologically. Skin:  Intact without significant lesions or rashes.   Intake/Output from previous day: 02/16 0701 - 02/17 0700 In: 1136 [P.O.:804; Blood:332] Out: 500 [Urine:500] Intake/Output this shift: No intake/output data recorded.  Lab Results: Recent Labs    03/03/18 0538 03/03/18 2239 03/04/18 0528  WBC 10.6* 9.2 10.0  HGB 7.5* 8.4* 9.4*  HCT 23.6* 26.0* 29.7*  PLT 231 227 254   BMET Recent Labs    03/02/18 0324  03/03/18 0538 03/04/18 0528  NA 151* 145 144  K 3.6 3.1* 3.6  CL 125* 120* 119*  CO2 17* 18* 16*  GLUCOSE 112* 93 94  BUN 42* 36* 30*  CREATININE 1.77* 1.78* 1.74*  CALCIUM 8.2* 8.3* 8.1*     Studies/Results: No results found.   Tye Savoy, NP-C @  03/04/2018, 10:13 AM

## 2018-03-05 ENCOUNTER — Encounter (HOSPITAL_COMMUNITY): Payer: Self-pay | Admitting: Certified Registered Nurse Anesthetist

## 2018-03-05 ENCOUNTER — Encounter (HOSPITAL_COMMUNITY)
Admission: EM | Disposition: A | Discharge status: Home / Self Care | Payer: Self-pay | Source: Home / Self Care | Attending: Interventional Cardiology

## 2018-03-05 ENCOUNTER — Inpatient Hospital Stay (HOSPITAL_COMMUNITY): Payer: Medicare PPO | Admitting: Certified Registered Nurse Anesthetist

## 2018-03-05 DIAGNOSIS — K31819 Angiodysplasia of stomach and duodenum without bleeding: Secondary | ICD-10-CM

## 2018-03-05 DIAGNOSIS — D62 Acute posthemorrhagic anemia: Secondary | ICD-10-CM

## 2018-03-05 DIAGNOSIS — R195 Other fecal abnormalities: Secondary | ICD-10-CM

## 2018-03-05 HISTORY — PX: SUBMUCOSAL INJECTION: SHX5543

## 2018-03-05 HISTORY — PX: ESOPHAGOGASTRODUODENOSCOPY: SHX5428

## 2018-03-05 LAB — CBC
HCT: 31 % — ABNORMAL LOW (ref 36.0–46.0)
HEMATOCRIT: 26.6 % — AB (ref 36.0–46.0)
HEMOGLOBIN: 8.5 g/dL — AB (ref 12.0–15.0)
Hemoglobin: 10.4 g/dL — ABNORMAL LOW (ref 12.0–15.0)
MCH: 26.6 pg (ref 26.0–34.0)
MCH: 27.9 pg (ref 26.0–34.0)
MCHC: 32 g/dL (ref 30.0–36.0)
MCHC: 33.5 g/dL (ref 30.0–36.0)
MCV: 83.1 fL (ref 80.0–100.0)
MCV: 83.1 fL (ref 80.0–100.0)
Platelets: 247 10*3/uL (ref 150–400)
Platelets: 333 10*3/uL (ref 150–400)
RBC: 3.2 MIL/uL — ABNORMAL LOW (ref 3.87–5.11)
RBC: 3.73 MIL/uL — AB (ref 3.87–5.11)
RDW: 16.3 % — ABNORMAL HIGH (ref 11.5–15.5)
RDW: 16.2 % — ABNORMAL HIGH (ref 11.5–15.5)
WBC: 8.5 10*3/uL (ref 4.0–10.5)
WBC: 11.9 10*3/uL — ABNORMAL HIGH (ref 4.0–10.5)
nRBC: 0 % (ref 0.0–0.2)
nRBC: 0 % (ref 0.0–0.2)

## 2018-03-05 LAB — BASIC METABOLIC PANEL
Anion gap: 8 (ref 5–15)
BUN: 26 mg/dL — ABNORMAL HIGH (ref 8–23)
CHLORIDE: 118 mmol/L — AB (ref 98–111)
CO2: 17 mmol/L — AB (ref 22–32)
Calcium: 8.1 mg/dL — ABNORMAL LOW (ref 8.9–10.3)
Creatinine, Ser: 1.73 mg/dL — ABNORMAL HIGH (ref 0.44–1.00)
GFR calc Af Amer: 32 mL/min — ABNORMAL LOW (ref >60)
GFR calc non Af Amer: 28 mL/min — ABNORMAL LOW (ref >60)
Glucose, Bld: 105 mg/dL — ABNORMAL HIGH (ref 70–99)
Potassium: 3.8 mmol/L (ref 3.5–5.1)
Sodium: 143 mmol/L (ref 135–145)

## 2018-03-05 SURGERY — EGD (ESOPHAGOGASTRODUODENOSCOPY)
Anesthesia: Monitor Anesthesia Care

## 2018-03-05 MED ORDER — EPINEPHRINE PF 1 MG/10ML IJ SOSY
PREFILLED_SYRINGE | INTRAMUSCULAR | Status: AC
  Filled 2018-03-05: qty 10

## 2018-03-05 MED ORDER — HYDRALAZINE HCL 20 MG/ML IJ SOLN
10.0000 mg | INTRAMUSCULAR | Status: DC | PRN
  Administered 2018-03-05: 10 mg via INTRAVENOUS
  Filled 2018-03-05: qty 1

## 2018-03-05 MED ORDER — ONDANSETRON HCL 4 MG/2ML IJ SOLN
INTRAMUSCULAR | Status: DC | PRN
  Administered 2018-03-05: 4 mg via INTRAVENOUS

## 2018-03-05 MED ORDER — HYDRALAZINE HCL 20 MG/ML IJ SOLN
5.0000 mg | Freq: Once | INTRAMUSCULAR | Status: AC
  Administered 2018-03-05: 5 mg via INTRAVENOUS
  Filled 2018-03-05: qty 1

## 2018-03-05 MED ORDER — METOPROLOL TARTRATE 5 MG/5ML IV SOLN: 2.5000 mg | INTRAVENOUS | Status: DC | PRN

## 2018-03-05 MED ORDER — SODIUM CHLORIDE (PF) 0.9 % IJ SOLN
PREFILLED_SYRINGE | INTRAMUSCULAR | Status: DC | PRN
  Administered 2018-03-05: 6 mL

## 2018-03-05 MED ORDER — FENTANYL CITRATE (PF) 100 MCG/2ML IJ SOLN
25.0000 ug | Freq: Once | INTRAMUSCULAR | Status: AC
  Administered 2018-03-05: 25 ug via INTRAVENOUS

## 2018-03-05 MED ORDER — PROPOFOL 500 MG/50ML IV EMUL
INTRAVENOUS | Status: DC | PRN
  Administered 2018-03-05: 50 ug/kg/min via INTRAVENOUS

## 2018-03-05 MED ORDER — FENTANYL CITRATE (PF) 100 MCG/2ML IJ SOLN
INTRAMUSCULAR | Status: AC
  Filled 2018-03-05: qty 2

## 2018-03-05 MED ORDER — ONDANSETRON HCL 4 MG/2ML IJ SOLN
INTRAMUSCULAR | Status: AC
  Filled 2018-03-05: qty 2

## 2018-03-05 MED ORDER — LIDOCAINE 2% (20 MG/ML) 5 ML SYRINGE
INTRAMUSCULAR | Status: DC | PRN
  Administered 2018-03-05: 40 mg via INTRAVENOUS

## 2018-03-05 MED ORDER — PROPOFOL 10 MG/ML IV BOLUS
INTRAVENOUS | Status: DC | PRN
  Administered 2018-03-05: 10 mg via INTRAVENOUS
  Administered 2018-03-05: 20 mg via INTRAVENOUS
  Administered 2018-03-05: 10 mg via INTRAVENOUS

## 2018-03-05 MED ORDER — CARVEDILOL 12.5 MG PO TABS
12.5000 mg | ORAL_TABLET | Freq: Two times a day (BID) | ORAL | Status: DC
  Administered 2018-03-05 – 2018-03-08 (×6): 12.5 mg via ORAL
  Filled 2018-03-05 (×2): qty 1
  Filled 2018-03-05: qty 2
  Filled 2018-03-05 (×3): qty 1

## 2018-03-05 MED ORDER — LACTATED RINGERS IV SOLN
INTRAVENOUS | Status: AC | PRN
  Administered 2018-03-05: 1000 mL via INTRAVENOUS

## 2018-03-05 NOTE — Anesthesia Procedure Notes (Signed)
Procedure Name: MAC Date/Time: 03/05/2018 10:25 AM Performed by: Candis Shine, CRNA Pre-anesthesia Checklist: Patient identified, Emergency Drugs available, Suction available, Patient being monitored and Timeout performed Patient Re-evaluated:Patient Re-evaluated prior to induction Oxygen Delivery Method: Nasal cannula Dental Injury: Teeth and Oropharynx as per pre-operative assessment

## 2018-03-05 NOTE — Progress Notes (Signed)
Called by RN regarding HTN, BP 202/101. She is having severe abdominal pain, ever since the GI procedure where her AVMs were injected w/ epinephrine.   She is lethargic, so has not received any pain meds.  The GI team stated pt could have abd pain from the Epi for up to 24 hr. The Epi and the pain may account for the more severe HTN>>will increase Coreg, otherwise use prn meds.  Continue to follow closely, w/ her age and poor renal function, she may take a while to clear the sedation.   Ok to give pain rx once pt awake enough to tolerate.  Continue to follow closely.  Rosaria Ferries, PA-C 03/05/2018 5:35 PM [facility] [phone]

## 2018-03-05 NOTE — Anesthesia Preprocedure Evaluation (Signed)
Anesthesia Evaluation  Patient identified by MRN, date of birth, ID band Patient awake    Reviewed: Allergy & Precautions, H&P , NPO status , Patient's Chart, lab work & pertinent test results  Airway Mallampati: II  TM Distance: >3 FB Neck ROM: full    Dental no notable dental hx. (+) Edentulous Upper, Edentulous Lower   Pulmonary Current Smoker,    Pulmonary exam normal breath sounds clear to auscultation       Cardiovascular Exercise Tolerance: Good hypertension, Normal cardiovascular exam Rhythm:Regular Rate:Normal     Neuro/Psych    GI/Hepatic negative GI ROS, Neg liver ROS,   Endo/Other  negative endocrine ROS  Renal/GU   negative genitourinary   Musculoskeletal   Abdominal Normal abdominal exam  (+)   Peds  Hematology negative hematology ROS (+)   Anesthesia Other Findings   Reproductive/Obstetrics negative OB ROS                             Anesthesia Physical  Anesthesia Plan  ASA: II  Anesthesia Plan: MAC   Post-op Pain Management:    Induction: Intravenous  PONV Risk Score and Plan: 1 and Ondansetron and Treatment may vary due to age or medical condition  Airway Management Planned: Nasal Cannula  Additional Equipment:   Intra-op Plan:   Post-operative Plan:   Informed Consent: I have reviewed the patients History and Physical, chart, labs and discussed the procedure including the risks, benefits and alternatives for the proposed anesthesia with the patient or authorized representative who has indicated his/her understanding and acceptance.       Plan Discussed with: CRNA and Surgeon  Anesthesia Plan Comments:         Anesthesia Quick Evaluation

## 2018-03-05 NOTE — Transfer of Care (Signed)
Immediate Anesthesia Transfer of Care Note  Patient: [name]  Procedure(s) Performed: ESOPHAGOGASTRODUODENOSCOPY (EGD) (N/A )  Patient Location: Endoscopy Unit  Anesthesia Type:MAC  Level of Consciousness: drowsy  Airway & Oxygen Therapy: Patient Spontanous Breathing and Patient connected to nasal cannula oxygen  Post-op Assessment: Report given to RN and Post -op Vital signs reviewed and stable  Post vital signs: Reviewed and stable  Last Vitals:  Vitals Value Taken Time  BP    Temp    Pulse 86 03/05/2018 11:00 AM  Resp 21 03/05/2018 11:00 AM  SpO2 99 % 03/05/2018 11:00 AM  Vitals shown include unvalidated device data.  Last Pain:  Vitals:   03/05/18 0939  TempSrc: Oral  PainSc: 0-No pain      Patients Stated Pain Goal: 0 (26/71/24 5809)  Complications: No apparent anesthesia complications

## 2018-03-05 NOTE — Progress Notes (Addendum)
Progress Note  Patient Name: [name] Date of Encounter: 03/05/2018  Primary Cardiologist: Larae Grooms, MD   Subjective   Currently off the floor in endoscopy.  Addendum: The patient was seen after returning from endoscopy where she had gastric AVM clipped x3 and had epinephrine injections because of bleeding.  Some abdominal discomfort with elevation in blood pressure transiently after returning to the floor.  Now she is much more comfortable and I had to awaken her from sleep at approximately 3:20 PM.  Inpatient Medications    Scheduled Meds: . [MAR Hold] atorvastatin  80 mg Oral q1800  . [MAR Hold] carvedilol  6.25 mg Oral BID WC  . [MAR Hold] chlorhexidine  15 mL Mouth Rinse BID  . [MAR Hold] levETIRAcetam  1,000 mg Oral Daily  . [MAR Hold] mouth rinse  15 mL Mouth Rinse q12n4p  . [MAR Hold] pantoprazole (PROTONIX) IV  40 mg Intravenous Q12H  . [MAR Hold] sodium chloride flush  3 mL Intravenous Q12H  . [MAR Hold] ticagrelor  90 mg Oral BID   Continuous Infusions: . [MAR Hold] sodium chloride    . sodium chloride Stopped (03/01/18 1711)  . lactated ringers     PRN Meds: [MAR Hold] sodium chloride, [MAR Hold] acetaminophen, [MAR Hold] hydrALAZINE, lactated ringers, [MAR Hold] ondansetron (ZOFRAN) IV, [MAR Hold] ondansetron, [MAR Hold] sodium chloride flush   Vital Signs    Vitals:   03/04/18 2037 03/05/18 0628 03/05/18 0846 03/05/18 0939  BP: (!) 141/71 130/65 136/78 (!) 159/79  Pulse: 77 72 71   Resp: 18   19  Temp: 98.9 F (37.2 C) 98.5 F (36.9 C)  99.1 F (37.3 C)  TempSrc: Oral Oral  Oral  SpO2: 100% 100%  98%  Weight:  48 kg  48 kg  Height:    5\' 2"  (1.575 m)    Intake/Output Summary (Last 24 hours) at 03/05/2018 0956 Last data filed at 03/04/2018 1500 Gross per 24 hour  Intake -  Output 300 ml  Net -300 ml   Last 3 Weights 03/05/2018 03/05/2018 03/04/2018  Weight (lbs) 105 lb 13.1 oz 105 lb 13.1 oz 106 lb 9.6 oz  Weight (kg) 48 kg 48 kg  48.353 kg      Telemetry    Sinus rhythm- Personally Reviewed  ECG    No new tracings available- Personally Reviewed  Physical Exam  Currently off the floor in endoscopy.  Addendum: After returning from endoscopy, she experienced some discomfort in her abdomen and elevated blood pressure. Abdomen is soft with normal bowel sounds. Cardiac rhythm and auscultation is normal. There is no edema. She is somewhat lethargic, presumably related to lingering effects of sedation for endoscopy.  Labs    Chemistry Recent Labs  Lab 03/03/18 0538 03/04/18 0528 03/05/18 0432  NA 145 144 143  K 3.1* 3.6 3.8  CL 120* 119* 118*  CO2 18* 16* 17*  GLUCOSE 93 94 105*  BUN 36* 30* 26*  CREATININE 1.78* 1.74* 1.73*  CALCIUM 8.3* 8.1* 8.1*  GFRNONAA 27* 27* 28*  GFRAA 31* 32* 32*  ANIONGAP 7 9 8      Hematology Recent Labs  Lab 03/03/18 2239 03/04/18 0528 03/05/18 0432  WBC 9.2 10.0 8.5  RBC 3.17* 3.51* 3.20*  HGB 8.4* 9.4* 8.5*  HCT 26.0* 29.7* 26.6*  MCV 82.0 84.6 83.1  MCH 26.5 26.8 26.6  MCHC 32.3 31.6 32.0  RDW 15.8* 16.4* 16.3*  PLT 227 254 247    Cardiac Enzymes  Recent Labs  Lab 03/01/18 0909  TROPONINI 6.05*    Recent Labs  Lab 02/27/18 2356  TROPIPOC 0.01     BNPNo results for input(s): BNP, PROBNP in the last 168 hours.   DDimer No results for input(s): DDIMER in the last 168 hours.   Radiology    No results found.  Cardiac Studies   No new data  Patient Profile     78 y.o. female CKD and RQ who presented to the ED via EMS for an inferior STEMI. She was subsequently intubated for airway protection and PCI was performed.   Subsequent massive GI bleed requiring transfusion.  Assessment & Plan    1. Inferior STEMI treated with stenting, using 38 mm of stent.  This will place her at higher than usual risk of stent thrombosis.  With a third generation thin stripe stent, the acute risk is still relatively low and likely less than 1% on dual  antiplatelet therapy and probably not much different on Brilinta monotherapy.  Given the massive bleeding, we are prepared to discontinue aspirin therapy and go with monotherapy if needed. 2. ANEMIA: Clearly related to blood loss.  Recurrent drop in hemoglobin below 8.5.  Monitor hemoglobin closely. 3. Acute kidney injury, with stage III GFR, with stable function over the last 72 hours. 4. Hypokalemia has been repleted. 5. Hypertension being controlled with beta-blocker therapy.  We will plan additional therapy as needed and as tolerated by renal function (diuretics/angiotensin system blockade).  For questions or updates, please contact [place] Please consult [email or web address] for contact info under        Signed, Sinclair Grooms, MD  03/05/2018, 9:56 AM

## 2018-03-05 NOTE — Progress Notes (Signed)
SLP Cancellation Note  Patient Details Name: [name] MRN: [id number] DOB: 1940-06-10   Cancelled treatment:       Reason Eval/Treat Not Completed: Patient at procedure or test/unavailable   Talbert Nan 03/05/2018, 9:53 AM  Nuala Alpha, M.A. [place] Acute Environmental education officer [phone] Office [phone]

## 2018-03-05 NOTE — Progress Notes (Addendum)
    Called by RN. Patient returned from Endo with elevated blood pressures, and abd pain. RN reported patient had significant pain after the procedure and given fentanyl with little relief. Abd is soft and non-tender on exam, though she appears pale and somewhat lethargic.  Plan- -- check CBC -- EKG -- given 5mg  IV hydralazine x1 now as to not drop blood pressure to quickly as her pressures have been stable prior to procedure today.   The patient has been seen in conjunction with Reino Bellis, NP. All aspects of care have been considered and discussed. The patient has been personally interviewed, examined, and all clinical data has been reviewed.   Agree with above

## 2018-03-05 NOTE — Progress Notes (Signed)
Pt  Had 5.05 pause.  Asymptomatic and noted sleeping.  MD Claiborne Billings made aware via amion.

## 2018-03-05 NOTE — Op Note (Signed)
[facility] Patient Name: [name] Procedure Date : 03/05/2018 MRN: [id number] Attending MD: Ladene Artist , MD Date of Birth: 26-May-1940 CSN: [id number] Age: 78 Admit Type: Inpatient Procedure:                Upper GI endoscopy Indications:              Acute post hemorrhagic anemia, Heme positive stool Providers:                Pricilla Riffle. Fuller Plan, MD, Dorise Hiss, RN, Carlyn Reichert, RN, Cherylynn Ridges, Technician, Dellie Catholic, CRNA Referring MD:             [name], MD Medicines:                Monitored Anesthesia Care Complications:            No immediate complications. Estimated Blood Loss:     Estimated blood loss was minimal. Procedure:                Pre-Anesthesia Assessment:                           - Prior to the procedure, a History and Physical                            was performed, and patient medications and                            allergies were reviewed. The patient's tolerance of                            previous anesthesia was also reviewed. The risks                            and benefits of the procedure and the sedation                            options and risks were discussed with the patient.                            All questions were answered, and informed consent                            was obtained. Prior Anticoagulants: The patient has                            taken Brilinta antiplatelet medication, last dose                            was day of procedure. ASA Grade Assessment: III - A  patient with severe systemic disease. After                            reviewing the risks and benefits, the patient was                            deemed in satisfactory condition to undergo the                            procedure.                           After obtaining informed consent, the endoscope was                            passed under  direct vision. Throughout the                            procedure, the patient's blood pressure, pulse, and                            oxygen saturations were monitored continuously. The                            GIF-H190 ([id number] Olympus gastroscope was                            introduced through the mouth, and advanced to the                            second part of duodenum. The upper GI endoscopy was                            accomplished without difficulty. The patient                            tolerated the procedure well. Scope In: Scope Out: Findings:      The examined esophagus was normal.      Patchy mildly erythematous mucosa without bleeding was found at the       pylorus.      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 For hemostasis, three hemostatic clips       were successfully placed (MR conditional). There was persistent oozing       after the first clip was placed and no bleeding at the end of the       procedure. Area was successfully injected with 6 mL of a 1:10,000       solution of epinephrine for hemostasis.      The exam of the stomach was otherwise normal.      Patchy mildly erythematous mucosa without active bleeding and with no       stigmata of bleeding was found in the duodenal bulb.      The second portion of the duodenum was normal. Impression:               -  Normal esophagus.                           - Erythematous mucosa in the pylorus.                           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 Clips (MR conditional) were placed.                            Injected.                           - Erythematous duodenopathy.                           - Normal second portion of the duodenum.                           - No specimens collected. 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 Additional GI tract AVMs would                            be typical.                           - Return patient to hospital ward for ongoing care.                           - Anticipate epigastric pain for a couple hours                            post epi injection.                           - Clear liquid diet today.                           - Continue present medications.                           - Considering hold of ASA while continuing Brilinta                            as per Dr. Tamala Julian.                           - Trend CBC. Procedure Code(s):        --- Professional ---                           [phone], Esophagogastroduodenoscopy,  flexible,                            transoral; with control of bleeding, any method Diagnosis Code(s):        --- Professional ---                           K31.89, Other diseases of stomach and duodenum                           K31.819, Angiodysplasia of stomach and duodenum                            without bleeding                           D62, Acute posthemorrhagic anemia                           R19.5, Other fecal abnormalities CPT copyright 2018 American Medical Association. All rights reserved. The codes documented in this report are preliminary and upon coder review may  be revised to meet current compliance requirements. Ladene Artist, MD 03/05/2018 11:35:58 AM This report has been signed electronically. Number of Addenda: 0

## 2018-03-05 NOTE — Anesthesia Postprocedure Evaluation (Signed)
Anesthesia Post Note  Patient: [name]  Procedure(s) Performed: ESOPHAGOGASTRODUODENOSCOPY (EGD) (N/A )     Patient location during evaluation: PACU Anesthesia Type: MAC Level of consciousness: awake and alert Pain management: pain level controlled Vital Signs Assessment: post-procedure vital signs reviewed and stable Respiratory status: spontaneous breathing, nonlabored ventilation and respiratory function stable Cardiovascular status: stable and blood pressure returned to baseline Postop Assessment: no apparent nausea or vomiting Anesthetic complications: no    Last Vitals:  Vitals:   03/05/18 1145 03/05/18 1200  BP: (!) 162/80 (!) 171/74  Pulse:    Resp: (!) 27 (!) 25  Temp:    SpO2: 99% 99%    Last Pain:  Vitals:   03/05/18 1200  TempSrc:   PainSc: [place]

## 2018-03-05 NOTE — Progress Notes (Addendum)
PT Cancellation Note  Patient Details Name: [name] MRN: [id number] DOB: 10/12/1940   Cancelled Treatment:    Reason Eval/Treat Not Completed: Patient at procedure or test/unavailable (off floor to endoscopy). Will follow-up for PT treatment as schedule permits.  2:30pm - Pt returned from endo with elevated BP and abdominal pain. Will hold off for PT treatment.  Mabeline Caras, PT, DPT Acute Rehabilitation Services  Pager [phone] Office [place] 03/05/2018, 11:17 AM

## 2018-03-06 ENCOUNTER — Encounter (HOSPITAL_COMMUNITY): Payer: Self-pay | Admitting: Gastroenterology

## 2018-03-06 ENCOUNTER — Other Ambulatory Visit: Payer: Self-pay

## 2018-03-06 DIAGNOSIS — D62 Acute posthemorrhagic anemia: Secondary | ICD-10-CM

## 2018-03-06 DIAGNOSIS — K31819 Angiodysplasia of stomach and duodenum without bleeding: Secondary | ICD-10-CM

## 2018-03-06 LAB — CBC
HCT: 31.9 % — ABNORMAL LOW (ref 36.0–46.0)
Hemoglobin: 10.4 g/dL — ABNORMAL LOW (ref 12.0–15.0)
MCH: 26.9 pg (ref 26.0–34.0)
MCHC: 32.6 g/dL (ref 30.0–36.0)
MCV: 82.6 fL (ref 80.0–100.0)
Platelets: 345 10*3/uL (ref 150–400)
RBC: 3.86 MIL/uL — ABNORMAL LOW (ref 3.87–5.11)
RDW: 16.2 % — ABNORMAL HIGH (ref 11.5–15.5)
WBC: 19.6 10*3/uL — ABNORMAL HIGH (ref 4.0–10.5)
nRBC: 0 % (ref 0.0–0.2)

## 2018-03-06 MED ORDER — PANTOPRAZOLE SODIUM 40 MG PO TBEC
40.0000 mg | DELAYED_RELEASE_TABLET | Freq: Every day | ORAL | Status: DC
  Administered 2018-03-07 – 2018-03-08 (×2): 40 mg via ORAL
  Filled 2018-03-06 (×2): qty 1

## 2018-03-06 MED ORDER — PANTOPRAZOLE SODIUM 40 MG PO TBEC
40.0000 mg | DELAYED_RELEASE_TABLET | Freq: Two times a day (BID) | ORAL | Status: DC
  Administered 2018-03-06: 40 mg via ORAL
  Filled 2018-03-06: qty 1

## 2018-03-06 NOTE — Progress Notes (Addendum)
  Speech Language Pathology Treatment: Dysphagia  Patient Details Name: [name] MRN: [id number] DOB: [birth year] Today's Date: 03/06/2018 Time: 4970-2637 SLP Time Calculation (min) (ACUTE ONLY): 18 min  Assessment / Plan / Recommendation Clinical Impression  Pt seen for dysphagia therapy with son present after initial eval 2/19 recommendind regular texture and thin liquids. Around 11:20 pt's diet changed to clear liquid and several minutes later new order placed for carb mod/regular texture. Pt received clear liquid tray when lunch arrived. Pt's son stated pt voiced desire for softer diet. SLP educated/discussed various textures and pt/family/SLP decided on Dys 3 (mechanical soft) and continue thin. She exhibited mild throat clear x 1 following first teaspoon sip beef broth without additional signs with straw sips thin. SLP will follow once more to ensure efficiency with Dys 3 texture.   HPI HPI: [name] is a 78 y.o female s/p cardiac cath for inferior STMEMI s/p stent placement to DES to RCA.  Was hypotensive and unresponsive in ER requiring intubation.  Pt intubated 2/12-2/14 (around 36 hrs).  NGT placement attempted but unable.      SLP Plan  Continue with current plan of care       Recommendations  Diet recommendations: Dysphagia 3 (mechanical soft);Thin liquid Liquids provided via: Cup;Straw Medication Administration: Whole meds with liquid Supervision: Patient able to self feed;Intermittent supervision to cue for compensatory strategies Compensations: Slow rate;Small sips/bites Postural Changes and/or Swallow Maneuvers: Seated upright 90 degrees                Oral Care Recommendations: Oral care BID Follow up Recommendations: None SLP Visit Diagnosis: Dysphagia, unspecified (R13.10) Plan: Continue with current plan of care       GO                Houston Siren 03/06/2018, 12:20 PM  Orbie Pyo Colvin Caroli.Ed Chief Technology Officer [phone] Office [phone]

## 2018-03-06 NOTE — Progress Notes (Signed)
Nutrition Follow-up  DOCUMENTATION CODES:   Not applicable  INTERVENTION:  Ensure Enlive po BID, each supplement provides 350 kcal and 20 grams of protein Monitor PO intake.  NUTRITION DIAGNOSIS:   Inadequate oral intake related to poor appetite as evidenced by per patient/family report.  Ongoing  GOAL:   Patient will meet greater than or equal to 90% of their needs  Progressing   MONITOR:   Supplement acceptance  ASSESSMENT:   78 year old female who presented to the ED on 2/12 as code STEMI. PMH significant for DVT, HTN, CKD, seizures. Pt intubated in the ED. Pt is s/p cardiac cath and stent placement.  2/14 extubated 2/18 EGD; pt had UGI d/t gastric AVM. 3 clips were placed and epinephrine injection administered. 2/19 SLP recommended D3/Thin liquids  Spoke with patient and son. Patient was just finishing up with PT, was having trouble ambulating.   Pt reports poor appetite since coming to the hospital. Per chart, meal completion is between 25-75%. Meal tray was delivered but she had not ate any of it; expressed minimal interest in eating. Encouraged her to try to eat to help gain her strength back.   Recommended Ensure BID; pt was receptive and willing to drink them.   Medications reviewed and include: Protonix Labs reviewed: Bun 26 (H), Creatinine 1.73 (H)  Diet Order:   Diet Order            DIET DYS 3 Room service appropriate? Yes; Fluid consistency: Thin  Diet effective now              EDUCATION NEEDS:   Not appropriate for education at this time  Skin:  Skin Assessment: Reviewed RN Assessment  Last BM:  2/19  Height:   Ht Readings from Last 1 Encounters:  03/05/18 5\' 2"  (1.575 m)    Weight:   Wt Readings from Last 1 Encounters:  03/06/18 47.5 kg    Ideal Body Weight:  50 kg  BMI:  Body mass index is 19.15 kg/m.  Estimated Nutritional Needs:   Kcal:  1250-1450  Protein:  65-75 grams  Fluid:  1.3-1.5 L    [employer]

## 2018-03-06 NOTE — Progress Notes (Addendum)
The patient has been seen in conjunction with Harlan Stains, NP. All aspects of care have been considered and discussed. The patient has been personally interviewed, examined, and all clinical data has been reviewed.   Stable from cardiac standpoint.  Hemoglobin currently stable.  No ischemic symptoms on single antiplatelet therapy, Brilinta.  Plan to continue to track hemoglobin for at least another 24 hours.  Ambulate and if no recurrent significant bleeding, plan discharge perhaps as early as tomorrow.   Progress Note  Patient Name: [name] Date of Encounter: 03/06/2018  Primary Cardiologist: Larae Grooms, MD   Subjective   States she is feeling better today, no abd pain/chest pain. More alert this morning, but still appears sleepy.   Inpatient Medications    Scheduled Meds: . atorvastatin  80 mg Oral q1800  . carvedilol  12.5 mg Oral BID WC  . chlorhexidine  15 mL Mouth Rinse BID  . levETIRAcetam  1,000 mg Oral Daily  . mouth rinse  15 mL Mouth Rinse q12n4p  . pantoprazole  40 mg Oral BID  . sodium chloride flush  3 mL Intravenous Q12H  . ticagrelor  90 mg Oral BID   Continuous Infusions: . sodium chloride    . sodium chloride Stopped (03/01/18 1711)   PRN Meds: sodium chloride, acetaminophen, hydrALAZINE, metoprolol tartrate, ondansetron (ZOFRAN) IV, ondansetron, sodium chloride flush   Vital Signs    Vitals:   03/05/18 2019 03/05/18 2023 03/05/18 2200 03/06/18 0457  BP: 95/61   118/67  Pulse: 88  89 (!) 103  Resp: (!) 22  19 20   Temp:  97.7 F (36.5 C)    TempSrc:  Oral    SpO2: 97%  97% 96%  Weight:    47.5 kg  Height:        Intake/Output Summary (Last 24 hours) at 03/06/2018 0858 Last data filed at 03/05/2018 2021 Gross per 24 hour  Intake 250 ml  Output 951 ml  Net -701 ml   Last 3 Weights 03/06/2018 03/05/2018 03/05/2018  Weight (lbs) 104 lb 11.5 oz 105 lb 13.1 oz 105 lb 13.1 oz  Weight (kg) 47.5 kg 48 kg 48 kg       Telemetry    SR with episodes of ST - Personally Reviewed  ECG    SR - Personally Reviewed  Physical Exam   GEN: No acute distress.  Alert and oriented.  Neck: No JVD Cardiac: RRR, 2/6 systolic murmur, no rubs, or gallops.  Respiratory: Clear to auscultation bilaterally. GI: Soft, nontender, non-distended  MS: No edema; No deformity. Neuro:  Nonfocal  Psych: Normal affect   Labs    Chemistry Recent Labs  Lab 03/03/18 0538 03/04/18 0528 03/05/18 0432  NA 145 144 143  K 3.1* 3.6 3.8  CL 120* 119* 118*  CO2 18* 16* 17*  GLUCOSE 93 94 105*  BUN 36* 30* 26*  CREATININE 1.78* 1.74* 1.73*  CALCIUM 8.3* 8.1* 8.1*  GFRNONAA 27* 27* 28*  GFRAA 31* 32* 32*  ANIONGAP 7 9 8      Hematology Recent Labs  Lab 03/05/18 0432 03/05/18 1602 03/06/18 0337  WBC 8.5 11.9* 19.6*  RBC 3.20* 3.73* 3.86*  HGB 8.5* 10.4* 10.4*  HCT 26.6* 31.0* 31.9*  MCV 83.1 83.1 82.6  MCH 26.6 27.9 26.9  MCHC 32.0 33.5 32.6  RDW 16.3* 16.2* 16.2*  PLT 247 333 345    Cardiac Enzymes Recent Labs  Lab 03/01/18 0909  TROPONINI 6.05*    Recent Labs  Lab 02/27/18 2356  TROPIPOC 0.01     BNPNo results for input(s): BNP, PROBNP in the last 168 hours.   DDimer No results for input(s): DDIMER in the last 168 hours.   Radiology    No results found.  Cardiac Studies   Cath: 02/28/2018   Prox RCA lesion is 100% stenosed.  A drug-eluting stent was successfully placed using a STENT SYNERGY DES 2.5X38, postdilated to > 3.0 mm.  Post intervention, there is a 0% residual stenosis.  2nd Diag lesion is 40% stenosed.  Ost 2nd Diag lesion is 50% stenosed.  The left ventricular systolic function is normal.  LV end diastolic pressure is normal.  The left ventricular ejection fraction is 55-65% by visual estimate.  There is no aortic valve stenosis.  A drug-eluting stent was successfully placed using a STENT SYNERGY DES 2.5X38.  Continue dual antiplatelet therapy along with  aggressive secondary prevention. Hopefully, she can be extubated later today.   I anticipate she will be in the hospital for 2-3 days. Check echocardiogram to assess for mitral regurgitation.   Hematoma noted in the right wrist/forearm. All anticoagulation has been stopped. Continue compression.   TTE: 03/01/2018  1. The left ventricle has hyperdynamic systolic function, with an ejection fraction of >65%. The cavity size was normal. Severe basal septal hypertrophy of the septal wall. Left ventricular diastolic Doppler parameters are consistent with impaired  relaxation Elevated left ventricular end-diastolic pressure The E/e' is 26.2. No evidence of left ventricular regional wall motion abnormalities. 2. There is dynamic mid cavitary LV obstruction at rest up to 20mmHg. 3. No evidence of left ventricular regional wall motion abnormalities. 4. The right ventricle has normal systolic function. The cavity was normal. There is no increase in right ventricular wall thickness. 5. The anterior MV leaflet appears thickened at the tip. Cannot rule out myxomatous degeneration. Mitral valve regurgitation is moderate by color flow Doppler. The MR jet is eccentric laterally directed. 6. Consider TEE for further evaluation of MV. 7. The tricuspid valve is normal in structure. 8. The aortic valve has an indeterminant number of cusps Moderate calcification of the aortic valve. mild stenosis of the aortic valve. 9. The pulmonic valve was normal in structure. 10. The aortic root and ascending aorta are normal in size and structure. 11. Right atrial pressure is estimated at 3 mmHg.  Patient Profile     78 y.o. female with CKD, HTN, HL, seizures and RA who presented to the ED via EMS for an inferior STEMI. She was subsequently intubated for airway protection and PCI was performed. Extubated, and developed a GI bleed. Underwent EGD with AVM clipped.   Assessment & Plan    1. STEMI: s/p DES to  RCA. Echo with EF >65%, LV mid-cavity gradient, normal RV, moderate MR. Placed on DAPT with Ticagrelor 90 mg QD and ASA 81 mg QD. ASA has been stopped 2/2 to GI bleed. No chest pain. EKG stable yesterday afternoon.   2. AKI on CKD stage 3: Creatinine baseline around 1.3-1.4, now up to 1.74. No further diuretics needed (euvolemic) and holding off on ACEI/ARB for now. -- BMET in the morning  3. HTN: blood pressures were elevated yesterday afternoon post procedure.  -- coreg increased to 12.5mg  BID with improvement today.   4.Intubated for airway protection: Extubated 2/14 and stable. CXR clear.  5. Hypernatremia: resolved.   6. Anemia/GI bleed 2/2 to AVM: Hgb dropped to 7.5with dark stools and FOBT +. Given 1 unit PRBC with improved  to 9.4>>8.5 GI consulted and underwent EGD yesterday with AVM injected with epi/clipped - Hgb 10.4 today. She is now off ASA.   7. Leukocytosis: WBC up to 19 today. No fever, denies any dysuria. Maybe inflammatory response? - follow CBC for now  8. Deconditioned: PT working with her. Unable to see yesterday 2/2 to procedure and pain. Will probably need SNF at discharge.   For questions or updates, please contact [place] Please consult [email or web address] for contact info under   Signed, Reino Bellis, NP  03/06/2018, 8:58 AM

## 2018-03-06 NOTE — Progress Notes (Addendum)
Daily Rounding Note  03/06/2018, 10:36 AM  LOS: 6 days   SUBJECTIVE:   Chief complaint: Upper GI bleed with black stools.  Gastric AVM.  Patient denies nausea, abdominal pain. Does not have a lot to say.  OBJECTIVE:         Vital signs in last 24 hours:    Temp:  [97.5 F (36.4 C)-97.8 F (36.6 C)] 97.7 F (36.5 C) (02/18 2023) Pulse Rate:  [76-103] 99 (02/19 0925) Resp:  [15-27] 20 (02/19 0457) BP: (95-203)/(61-108) 128/68 (02/19 0925) SpO2:  [95 %-99 %] 96 % (02/19 0457) Weight:  [47.5 kg] 47.5 kg (02/19 0457) Last BM Date: 03/05/18 Filed Weights   03/05/18 0628 03/05/18 0939 03/06/18 0457  Weight: 48 kg 48 kg 47.5 kg   General: Frail, cachectic appearing.  Not speaking much at all. Heart: RRR.  2/6 systolic murmur. Chest: No labored breathing.  Does have a wet quality cough.  Diminished breath sounds but clear bilaterally. Abdomen: Soft, nondistended, nontender.  Bowel sounds active. Extremities: No CCE. Neuro/Psych: Laconic.  Oriented to [facility] and self but not to date/time.  Flat affect.  Moves all 4 limbs.  Intake/Output from previous day: 02/18 0701 - 02/19 0700 In: 250 [I.V.:250] Out: 951 [Urine:951]  Intake/Output this shift: Total I/O In: 3 [I.V.:3] Out: -   Lab Results: Recent Labs    03/05/18 0432 03/05/18 1602 03/06/18 0337  WBC 8.5 11.9* 19.6*  HGB 8.5* 10.4* 10.4*  HCT 26.6* 31.0* 31.9*  PLT 247 333 345   BMET Recent Labs    03/04/18 0528 03/05/18 0432  NA 144 143  K 3.6 3.8  CL 119* 118*  CO2 16* 17*  GLUCOSE 94 105*  BUN 30* 26*  CREATININE 1.74* 1.73*  CALCIUM 8.1* 8.1*    Scheduled Meds: . atorvastatin  80 mg Oral q1800  . carvedilol  12.5 mg Oral BID WC  . chlorhexidine  15 mL Mouth Rinse BID  . levETIRAcetam  1,000 mg Oral Daily  . mouth rinse  15 mL Mouth Rinse q12n4p  . pantoprazole  40 mg Oral BID  . sodium chloride flush  3 mL Intravenous  Q12H  . ticagrelor  90 mg Oral BID   Continuous Infusions: . sodium chloride    . sodium chloride Stopped (03/01/18 1711)   PRN Meds:.sodium chloride, acetaminophen, hydrALAZINE, metoprolol tartrate, ondansetron (ZOFRAN) IV, ondansetron, sodium chloride flush   ASSESMENT:   *   UGIB with black, FOBT positive stool.   03/05/2018 EGD: Pyloric erythema.  Solitary, nonbleeding gastric AVM, treated with placement of 3 endoclips.  Clipping associated with some blood oozing but abated following injection of epinephrine mild, patchy duodenal bulb erythema without evidence for bleeding.  Patient is that the AVM had bled in the setting of Brilinta and aspirin.  *   Blood loss anemia.  Hgb improved, stable after 1 U PRBC 2/16.    *   DAPT, aspirin/Brilinta, following STEMI/PCI.  Brilinta continues, aspirin on hold  *   AKI, CKD stage 3-4.  PLAN   *  Protonix 40 mg Q day.    *  Advance to carb mod diet.    *  GI signing off.    Azucena Freed  03/06/2018, 10:36 AM Phone [phone]    Attending Physician Note   I have taken an interval history, reviewed the chart and examined the patient. I agree with the Advanced Practitioner's note, impression  and recommendations.   UGI bleed due to a gastric AVM treated with 3 clips and epi injection Post epi injection epigastric pain has resolved  ABL anemia STEMI with DES on Brilinta  Advance diet  Continue Protonix 40 mg po qd  Monitor CBC at regular interval as inpatient and outpatient Additional GI tract AVMs would be typical GI signing off GI follow up prn   Lucio Edward, MD [facility] [phone]

## 2018-03-06 NOTE — Progress Notes (Signed)
Physical Therapy Treatment Patient Details Name: [name] MRN: [id number] DOB: Jan 26, 1940 Today's Date: 03/06/2018    History of Present Illness Pt is a 78 y.o. female admitted 02/27/18 with STEMI. Pt unresponsive and hypotensive in ER requiring intubation; ETT 2/12-2/14. S/p cath with DES to RCA 2/13. S/p EGD with AVM clipped 2/19. PMH includes CKD, RA, seizures.   PT Comments    Pt requiring increased assist for mobility, limited by lethargy, generalized weakness and decreased activity tolerance. Pt also with decreased problem solving and attention. Pt required min-maxA for taking steps to chair with RW. Son present during session. Discussed recommendation for SNF-level therapies prior to return home; pt and son in agreement. Will continue to follow acutely.   Follow Up Recommendations  SNF;Supervision for mobility/OOB     Equipment Recommendations  (TBD)    Recommendations for Other Services       Precautions / Restrictions Precautions Precautions: Fall;Other (comment) Precaution Comments: Urine incontinence, orthostatic hypotension Restrictions Weight Bearing Restrictions: No    Mobility  Bed Mobility Overal bed mobility: Needs Assistance Bed Mobility: Supine to Sit     Supine to sit: Mod assist     General bed mobility comments: ModA to assist trunk elevation and scoot hips to EOB; pt not following commands consistently despite max, multimodal cues  Transfers Overall transfer level: Needs assistance   Transfers: Sit to/from Stand Sit to Stand: Max assist         General transfer comment: MaxA to assist trunk elevation and prevent posterior LOB, cues to grab hold of RW  Ambulation/Gait Ambulation/Gait assistance: Min assist;Mod assist Gait Distance (Feet): 2 Feet Assistive device: Rolling walker (2 wheeled) Gait Pattern/deviations: Step-to pattern;Trunk flexed;Leaning posteriorly;Shuffle Gait velocity: Decreased   General Gait Details: Pt able to  initiate steps towards recliner with max, multimodal cues and minA to prevent posterior LOB; halway, pt stopping stating she had to pee, urinating on floor, then stopped moving forward despite max cues; required totalA to pivot and sit in recliner   Stairs             Wheelchair Mobility    Modified Rankin (Stroke Patients Only)       Balance Overall balance assessment: Needs assistance Sitting-balance support: No upper extremity supported Sitting balance-Leahy Scale: Poor Sitting balance - Comments: Intermittent posterior lean with increased fatigue requiring minA to correct multiple times     Standing balance-Leahy Scale: Poor                              Cognition Arousal/Alertness: Lethargic Behavior During Therapy: Flat affect Overall Cognitive Status: Difficult to assess Area of Impairment: Following commands;Problem solving                       Following Commands: Follows one step commands inconsistently     Problem Solving: Slow processing;Decreased initiation;Requires verbal cues;Requires tactile cues General Comments: Pt lethargic, very slowed and quiet answers, although able to answer most questions appropriately. Max, multimodal cues for mobility, pt not following any commands while up      Exercises      General Comments General comments (skin integrity, edema, etc.): Son present- discussed SNF recommendation, pt and son agreeable      Pertinent Vitals/Pain Pain Assessment: Faces Faces Pain Scale: Hurts a little bit Pain Location: Abdomen Pain Descriptors / Indicators: Guarding Pain Intervention(s): Monitored during session;Limited activity within patient's tolerance  Home Living                      Prior Function            PT Goals (current goals can now be found in the care plan section) Acute Rehab PT Goals Patient Stated Goal: Agreeable to rehab at SNF before return home PT Goal Formulation: With  patient/family Time For Goal Achievement: 03/17/18 Potential to Achieve Goals: Good Progress towards PT goals: Goals downgraded-see care plan    Frequency    Min 2X/week      PT Plan Discharge plan needs to be updated;Frequency needs to be updated    Co-evaluation              AM-PAC PT "6 Clicks" Mobility   Outcome Measure  Help needed turning from your back to your side while in a flat bed without using bedrails?: A Lot Help needed moving from lying on your back to sitting on the side of a flat bed without using bedrails?: A Lot Help needed moving to and from a bed to a chair (including a wheelchair)?: A Lot Help needed standing up from a chair using your arms (e.g., wheelchair or bedside chair)?: A Lot Help needed to walk in hospital room?: A Lot Help needed climbing 3-5 steps with a railing? : Total 6 Click Score: 11    End of Session Equipment Utilized During Treatment: Gait belt Activity Tolerance: Patient limited by fatigue;Patient limited by lethargy Patient left: in chair;with call bell/phone within reach;with chair alarm set;with family/visitor present Nurse Communication: Mobility status PT Visit Diagnosis: Difficulty in walking, not elsewhere classified (R26.2);Muscle weakness (generalized) (M62.81);Unsteadiness on feet (R26.81)     Time: 3729-0211 PT Time Calculation (min) (ACUTE ONLY): 21 min  Charges:  $Therapeutic Activity: 8-22 mins                    Mabeline Caras, PT, DPT Acute Rehabilitation Services  Pager [phone] Office [phone]  Derry Lory 03/06/2018, 2:59 PM

## 2018-03-07 LAB — CBC
HCT: 27.4 % — ABNORMAL LOW (ref 36.0–46.0)
Hemoglobin: 8.8 g/dL — ABNORMAL LOW (ref 12.0–15.0)
MCH: 26.7 pg (ref 26.0–34.0)
MCHC: 32.1 g/dL (ref 30.0–36.0)
MCV: 83.3 fL (ref 80.0–100.0)
Platelets: 322 10*3/uL (ref 150–400)
RBC: 3.29 MIL/uL — ABNORMAL LOW (ref 3.87–5.11)
RDW: 16 % — ABNORMAL HIGH (ref 11.5–15.5)
WBC: 18 10*3/uL — ABNORMAL HIGH (ref 4.0–10.5)
nRBC: 0 % (ref 0.0–0.2)

## 2018-03-07 LAB — BASIC METABOLIC PANEL
Anion gap: 10 (ref 5–15)
BUN: 28 mg/dL — AB (ref 8–23)
CO2: 18 mmol/L — ABNORMAL LOW (ref 22–32)
Calcium: 7.9 mg/dL — ABNORMAL LOW (ref 8.9–10.3)
Chloride: 111 mmol/L (ref 98–111)
Creatinine, Ser: 1.93 mg/dL — ABNORMAL HIGH (ref 0.44–1.00)
GFR calc Af Amer: 28 mL/min — ABNORMAL LOW (ref >60)
GFR, EST NON AFRICAN AMERICAN: 24 mL/min — AB (ref >60)
Glucose, Bld: 99 mg/dL (ref 70–99)
Potassium: 3.5 mmol/L (ref 3.5–5.1)
Sodium: 139 mmol/L (ref 135–145)

## 2018-03-07 MED ORDER — SODIUM CHLORIDE 0.9 % IV SOLN
INTRAVENOUS | Status: AC
  Administered 2018-03-07 – 2018-03-08 (×2): via INTRAVENOUS

## 2018-03-07 NOTE — Progress Notes (Signed)
Physical Therapy Treatment Patient Details Name: [name] MRN: [id number] DOB: [birth year] Today's Date: 03/07/2018    History of Present Illness Pt is a 78 y.o. female admitted 02/27/18 with STEMI. Pt unresponsive and hypotensive in ER requiring intubation; ETT 2/12-2/14. S/p cath with DES to RCA 2/13. S/p EGD with AVM clipped 2/19. PMH includes CKD, RA, seizures.    PT Comments    Patient progressing with therapy today, stand pivoting to chair with mod A for stability. VSS.  Cont to rec SNF once medically ready for d/c.  Follow Up Recommendations  SNF;Supervision for mobility/OOB     Equipment Recommendations  None recommended by PT    Recommendations for Other Services OT consult     Precautions / Restrictions Precautions Precautions: Fall;Other (comment) Precaution Comments: Urine incontinence, orthostatic hypotension Restrictions Weight Bearing Restrictions: No    Mobility  Bed Mobility Overal bed mobility: Needs Assistance Bed Mobility: Supine to Sit     Supine to sit: Mod assist     General bed mobility comments: ModA to assist trunk elevation and scoot hips to EOB; pt not following commands consistently despite max, multimodal cues  Transfers Overall transfer level: Needs assistance Equipment used: 1 person hand held assist Transfers: Sit to/from Stand Sit to Stand: Max assist Stand pivot transfers: Min assist       General transfer comment: MaxA to assist trunk elevation and prevent posterior LOB, cues to grab hold of RW  Ambulation/Gait Ambulation/Gait assistance: Min assist;Mod assist   Assistive device: Rolling walker (2 wheeled) Gait Pattern/deviations: Step-to pattern;Trunk flexed;Leaning posteriorly;Shuffle Gait velocity: Decreased   General Gait Details: Pt able to initiate steps towards recliner with max, multimodal cues and minA to prevent posterior LOB; halway, pt stopping stating she had to pee, urinating on floor, then stopped moving  forward despite max cues; required totalA to pivot and sit in recliner   Stairs             Wheelchair Mobility    Modified Rankin (Stroke Patients Only)       Balance Overall balance assessment: Needs assistance Sitting-balance support: No upper extremity supported Sitting balance-Leahy Scale: Poor Sitting balance - Comments: Intermittent posterior lean with increased fatigue requiring minA to correct multiple times   Standing balance support: Single extremity supported Standing balance-Leahy Scale: Poor Standing balance comment: required UE support to stand                            Cognition Arousal/Alertness: Lethargic Behavior During Therapy: Flat affect Overall Cognitive Status: Difficult to assess Area of Impairment: Following commands;Problem solving                 Orientation Level: Disoriented to;Time   Memory: Decreased short-term memory Following Commands: Follows one step commands inconsistently     Problem Solving: Slow processing;Decreased initiation;Requires verbal cues;Requires tactile cues General Comments: Pt lethargic, very slowed and quiet answers, although able to answer most questions appropriately. Max, multimodal cues for mobility, pt not following any commands while up      Exercises      General Comments        Pertinent Vitals/Pain Pain Assessment: No/denies pain Faces Pain Scale: Hurts a little bit Pain Location: Abdomen Pain Descriptors / Indicators: Guarding Pain Intervention(s): Limited activity within patient's tolerance;Monitored during session    Home Living  Prior Function            PT Goals (current goals can now be found in the care plan section) Acute Rehab PT Goals Patient Stated Goal: Agreeable to rehab at SNF before return home PT Goal Formulation: With patient/family Time For Goal Achievement: 03/17/18 Potential to Achieve Goals: Good Progress towards PT  goals: Progressing toward goals    Frequency    Min 2X/week      PT Plan Discharge plan needs to be updated;Frequency needs to be updated    Co-evaluation              AM-PAC PT "6 Clicks" Mobility   Outcome Measure  Help needed turning from your back to your side while in a flat bed without using bedrails?: A Lot Help needed moving from lying on your back to sitting on the side of a flat bed without using bedrails?: A Lot Help needed moving to and from a bed to a chair (including a wheelchair)?: A Lot Help needed standing up from a chair using your arms (e.g., wheelchair or bedside chair)?: A Lot Help needed to walk in hospital room?: A Lot Help needed climbing 3-5 steps with a railing? : Total 6 Click Score: 11    End of Session Equipment Utilized During Treatment: Gait belt Activity Tolerance: Patient limited by fatigue;Patient limited by lethargy Patient left: in chair;with call bell/phone within reach;with chair alarm set;with family/visitor present Nurse Communication: Mobility status PT Visit Diagnosis: Difficulty in walking, not elsewhere classified (R26.2);Muscle weakness (generalized) (M62.81);Unsteadiness on feet (R26.81)     Time: 1715-1730 PT Time Calculation (min) (ACUTE ONLY): 15 min  Charges:  $Therapeutic Activity: 8-22 mins                     Reinaldo Berber, PT, DPT Acute Rehabilitation Services Pager: [phone] Office: [phone]     Reinaldo Berber 03/07/2018, 5:15 PM

## 2018-03-07 NOTE — Progress Notes (Signed)
  Speech Language Pathology Treatment: Dysphagia  Patient Details Name: [name] MRN: [id number] DOB: 1940/08/05 Today's Date: 03/07/2018 Time: 1540-0867 SLP Time Calculation (min) (ACUTE ONLY): 15 min  Assessment / Plan / Recommendation Clinical Impression  Pt reported she sees a positive change since texture modified to Dys 3 (chopped meats). Observed masticating chopped [place], soft broccoli in timely fashion, no residue No cough or throat clear with thin liquid, intermittent wet vocal quality. Educated on safe swallow precautions- continue Dys 3 (baseline texture), thin liquids. ST will sign off.   HPI HPI: Jazmaine Fuelling is a 78 y.o female s/p cardiac cath for inferior STMEMI s/p stent placement to DES to RCA.  Was hypotensive and unresponsive in ER requiring intubation.  Pt intubated 2/12-2/14 (around 36 hrs).  NGT placement attempted but unable.      SLP Plan  All goals met;Discharge SLP treatment due to (comment)       Recommendations  Diet recommendations: Dysphagia 3 (mechanical soft);Thin liquid Liquids provided via: Cup;Straw Medication Administration: Whole meds with liquid Supervision: Patient able to self feed Compensations: Slow rate;Small sips/bites Postural Changes and/or Swallow Maneuvers: Seated upright 90 degrees                Oral Care Recommendations: Oral care BID Follow up Recommendations: None SLP Visit Diagnosis: Dysphagia, unspecified (R13.10) Plan: All goals met;Discharge SLP treatment due to (comment)       GO                Houston Siren 03/07/2018, 2:25 PM  Orbie Pyo Colvin Caroli.Ed Risk analyst [phone] Office [phone]

## 2018-03-07 NOTE — Care Management Important Message (Signed)
Important Message  Patient Details  Name: [name] MRN: [id number] Date of Birth: 1940-06-17   Medicare Important Message Given:  Yes    [name] Lyndel Sarate 03/07/2018, 12:06 PM

## 2018-03-07 NOTE — Progress Notes (Addendum)
The patient has been seen in conjunction with Harlan Stains, NP. All aspects of care have been considered and discussed. The patient has been personally interviewed, examined, and all clinical data has been reviewed.   Complicated situation in this elderly female who presented with acute inferior infarction which was successfully treated with stenting. 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 Significant anemia requiring transfusion and now has elevated white count, recurrent decline in hemoglobin, and increasing creatinine.  Despite above, patient states she feels relatively well this morning. She is not eating well. Having difficulty sleeping in the hospital.  Plan is for skilled nursing facility for rehabilitation.  Will reinstitute gentle hydration, follow hemoglobin, and consider discharge tomorrow if she is ambulatory and all other concerns are stable.   Progress Note  Patient Name: [name] Date of Encounter: 03/07/2018  Primary Cardiologist: Larae Grooms, MD   Subjective   Feeling much better today. No complaints.   Inpatient Medications    Scheduled Meds: . atorvastatin  80 mg Oral q1800  . carvedilol  12.5 mg Oral BID WC  . chlorhexidine  15 mL Mouth Rinse BID  . levETIRAcetam  1,000 mg Oral Daily  . mouth rinse  15 mL Mouth Rinse q12n4p  . pantoprazole  40 mg Oral Daily  . sodium chloride flush  3 mL Intravenous Q12H  . ticagrelor  90 mg Oral BID   Continuous Infusions: . sodium chloride    . sodium chloride Stopped (03/01/18 1711)   PRN Meds: sodium chloride, acetaminophen, hydrALAZINE, metoprolol tartrate, ondansetron (ZOFRAN) IV, ondansetron, sodium chloride flush   Vital Signs    Vitals:   03/06/18 1649 03/06/18 2040 03/07/18 0415 03/07/18 0853  BP: (!) 99/59 (!) 105/53 118/60 (!) 97/59  Pulse: 77 75 75 79  Resp:  (!) 23 14   Temp:  99.6 F (37.6 C) 97.6 F (36.4 C)   TempSrc:  Oral      SpO2:  98% 98%   Weight:   47.9 kg   Height:        Intake/Output Summary (Last 24 hours) at 03/07/2018 0900 Last data filed at 03/07/2018 0855 Gross per 24 hour  Intake 246 ml  Output -  Net 246 ml   Last 3 Weights 03/07/2018 03/06/2018 03/05/2018  Weight (lbs) 105 lb 11.2 oz 104 lb 11.5 oz 105 lb 13.1 oz  Weight (kg) 47.945 kg 47.5 kg 48 kg      Telemetry    SR - Personally Reviewed  ECG    N/a - Personally Reviewed  Physical Exam   GEN: Thin, frail older AAF, No acute distress.   Neck: No JVD Cardiac: RRR, 2/6 systolic murmur, rubs, or gallops.  Respiratory: Clear to auscultation bilaterally. GI: Soft, nontender, non-distended  MS: No edema; No deformity. Bruising to right forearm improving.  Neuro:  Nonfocal  Psych: Normal affect   Labs    Chemistry Recent Labs  Lab 03/04/18 0528 03/05/18 0432 03/07/18 0327  NA 144 143 139  K 3.6 3.8 3.5  CL 119* 118* 111  CO2 16* 17* 18*  GLUCOSE 94 105* 99  BUN 30* 26* 28*  CREATININE 1.74* 1.73* 1.93*  CALCIUM 8.1* 8.1* 7.9*  GFRNONAA 27* 28* 24*  GFRAA 32* 32* 28*  ANIONGAP 9 8 10      Hematology Recent Labs  Lab 03/05/18 1602 03/06/18 0337 03/07/18 0327  WBC 11.9* 19.6* 18.0*  RBC 3.73* 3.86* 3.29*  HGB 10.4* 10.4* 8.8*  HCT  31.0* 31.9* 27.4*  MCV 83.1 82.6 83.3  MCH 27.9 26.9 26.7  MCHC 33.5 32.6 32.1  RDW 16.2* 16.2* 16.0*  PLT 333 345 322    Cardiac Enzymes Recent Labs  Lab 03/01/18 0909  TROPONINI 6.05*   No results for input(s): TROPIPOC in the last 168 hours.   BNPNo results for input(s): BNP, PROBNP in the last 168 hours.   DDimer No results for input(s): DDIMER in the last 168 hours.   Radiology    No results found.  Cardiac Studies   Cath: 02/28/2018   Prox RCA lesion is 100% stenosed.  A drug-eluting stent was successfully placed using a STENT SYNERGY DES 2.5X38, postdilated to > 3.0 mm.  Post intervention, there is a 0% residual stenosis.  2nd Diag lesion is 40%  stenosed.  Ost 2nd Diag lesion is 50% stenosed.  The left ventricular systolic function is normal.  LV end diastolic pressure is normal.  The left ventricular ejection fraction is 55-65% by visual estimate.  There is no aortic valve stenosis.  A drug-eluting stent was successfully placed using a STENT SYNERGY DES 2.5X38.  Continue dual antiplatelet therapy along with aggressive secondary prevention. Hopefully, she can be extubated later today.   I anticipate she will be in the hospital for 2-3 days. Check echocardiogram to assess for mitral regurgitation.   Hematoma noted in the right wrist/forearm. All anticoagulation has been stopped. Continue compression.  TTE: 03/01/2018  1. The left ventricle has hyperdynamic systolic function, with an ejection fraction of >65%. The cavity size was normal. Severe basal septal hypertrophy of the septal wall. Left ventricular diastolic Doppler parameters are consistent with impaired  relaxation Elevated left ventricular end-diastolic pressure The E/e' is 26.2. No evidence of left ventricular regional wall motion abnormalities. 2. There is dynamic mid cavitary LV obstruction at rest up to 78mmHg. 3. No evidence of left ventricular regional wall motion abnormalities. 4. The right ventricle has normal systolic function. The cavity was normal. There is no increase in right ventricular wall thickness. 5. The anterior MV leaflet appears thickened at the tip. Cannot rule out myxomatous degeneration. Mitral valve regurgitation is moderate by color flow Doppler. The MR jet is eccentric laterally directed. 6. Consider TEE for further evaluation of MV. 7. The tricuspid valve is normal in structure. 8. The aortic valve has an indeterminant number of cusps Moderate calcification of the aortic valve. mild stenosis of the aortic valve. 9. The pulmonic valve was normal in structure. 10. The aortic root and ascending aorta are normal in size and  structure. 11. Right atrial pressure is estimated at 3 mmHg.  Patient Profile     78 y.o. female with CKD, HTN, HL, seizures and RA who presented to the ED via EMS for an inferior STEMI. She was subsequently intubated for airway protection and PCI was performed.Extubated, and developed a GI bleed. Underwent EGD with AVM clipped.   Assessment & Plan    1. STEMI:s/p DES to RCA. Echo with EF >65%, LV mid-cavity gradient, normal RV, moderate MR.Placed onDAPT with Ticagrelor 90 mg QD and ASA 81 mg QD. ASA has been stopped 2/2 to GI bleed. No chest pain. Worked with PT yesterday without chest pain.   2. AKI on CKD stage 3: Creatinine baseline around 1.3-1.4, now up to 1.74>>1.9 today. No room for ACE/ARB therapy at this time. Follow BMET  3. KKX:FGHWE pressures stable with BB therapy.  4.Intubated for airway protection:Extubated 2/14 and stable. CXR clear.  5. Hypernatremia:resolved.  6. Anemia/GI bleed 2/2 to AVM: Hgb dropped to 7.5with dark stools and FOBT +. Given 1 unit PRBC with improved to 9.4>>8.5 GI consulted and underwent EGD yesterday with AVM injected with epi/clipped - Hgb was up to 10.6 but declined to 8.8 today. No bleeding reported. Will try to touch base with GI regarding drop -- on PPI  7. Leukocytosis: WBC up to 19, but now trending down. No fever. - follow CBC for now  8. Deconditioned: PT working with her. Recommending SNF. Asked RN to follow up with SW/CM today regarding options.   For questions or updates, please contact [name] Please consult [email or web address] for contact info under     Signed, Reino Bellis, NP  03/07/2018, 9:00 AM

## 2018-03-08 ENCOUNTER — Telehealth: Payer: Self-pay | Admitting: Physician Assistant

## 2018-03-08 ENCOUNTER — Other Ambulatory Visit: Payer: Self-pay | Admitting: Physician Assistant

## 2018-03-08 DIAGNOSIS — R195 Other fecal abnormalities: Secondary | ICD-10-CM

## 2018-03-08 DIAGNOSIS — D649 Anemia, unspecified: Secondary | ICD-10-CM

## 2018-03-08 LAB — CBC
HCT: 26.6 % — ABNORMAL LOW (ref 36.0–46.0)
HEMOGLOBIN: 8.4 g/dL — AB (ref 12.0–15.0)
MCH: 26.4 pg (ref 26.0–34.0)
MCHC: 31.6 g/dL (ref 30.0–36.0)
MCV: 83.6 fL (ref 80.0–100.0)
Platelets: 354 10*3/uL (ref 150–400)
RBC: 3.18 MIL/uL — ABNORMAL LOW (ref 3.87–5.11)
RDW: 15.9 % — ABNORMAL HIGH (ref 11.5–15.5)
WBC: 12.5 10*3/uL — ABNORMAL HIGH (ref 4.0–10.5)
nRBC: 0 % (ref 0.0–0.2)

## 2018-03-08 LAB — BASIC METABOLIC PANEL
Anion gap: 7 (ref 5–15)
BUN: 23 mg/dL (ref 8–23)
CHLORIDE: 113 mmol/L — AB (ref 98–111)
CO2: 19 mmol/L — AB (ref 22–32)
Calcium: 7.4 mg/dL — ABNORMAL LOW (ref 8.9–10.3)
Creatinine, Ser: 1.63 mg/dL — ABNORMAL HIGH (ref 0.44–1.00)
GFR calc Af Amer: 34 mL/min — ABNORMAL LOW (ref >60)
GFR calc non Af Amer: 30 mL/min — ABNORMAL LOW (ref >60)
Glucose, Bld: 89 mg/dL (ref 70–99)
Potassium: 3.3 mmol/L — ABNORMAL LOW (ref 3.5–5.1)
Sodium: 139 mmol/L (ref 135–145)

## 2018-03-08 MED ORDER — POTASSIUM CHLORIDE CRYS ER 20 MEQ PO TBCR
40.0000 meq | EXTENDED_RELEASE_TABLET | Freq: Once | ORAL | Status: AC
  Administered 2018-03-08: 40 meq via ORAL
  Filled 2018-03-08: qty 2

## 2018-03-08 MED ORDER — TICAGRELOR 90 MG PO TABS: 90.0000 mg | ORAL_TABLET | Freq: Two times a day (BID) | ORAL | 11 refills | Status: AC

## 2018-03-08 MED ORDER — CARVEDILOL 12.5 MG PO TABS: 12.5000 mg | ORAL_TABLET | Freq: Two times a day (BID) | ORAL | 6 refills | Status: DC

## 2018-03-08 MED ORDER — NITROGLYCERIN 0.4 MG SL SUBL: 0.4000 mg | SUBLINGUAL_TABLET | SUBLINGUAL | 12 refills | Status: DC | PRN

## 2018-03-08 MED ORDER — PANTOPRAZOLE SODIUM 40 MG PO TBEC: 40.0000 mg | DELAYED_RELEASE_TABLET | Freq: Every day | ORAL | 6 refills | Status: DC

## 2018-03-08 MED ORDER — ATORVASTATIN CALCIUM 80 MG PO TABS: 80.0000 mg | ORAL_TABLET | Freq: Every day | ORAL | 6 refills | Status: DC

## 2018-03-08 MED FILL — ATORVASTATIN CALCIUM 80 MG: 80 | 30 days supply | Qty: 30 | Fill #0 | Status: TO

## 2018-03-08 MED FILL — NITROGLYCERIN 0.4 MG TAB SL: 0.4 | 8 days supply | Qty: 25 | Fill #0 | Status: TO

## 2018-03-08 MED FILL — PANTOPRAZOLE SOD DR 40 MG T: 40 | 30 days supply | Qty: 30 | Fill #0

## 2018-03-08 MED FILL — BRILINTA 90 MG TABLET: 90 | 30 days supply | Qty: 60 | Fill #0 | Status: TO

## 2018-03-08 NOTE — Discharge Instructions (Signed)
Cardiogenic Shock  Shock is a life-threatening condition. It results from a lack of blood, oxygen, and nutrients in vital organs of the body. Cardiogenic shock is a type of shock that happens when the heart fails to pump blood effectively throughout the body. The usual cause of this problem is a damaged or weakened heart. Cardiogenic shock is a medical emergency and requires immediate treatment. What are the causes? The underlying cause of this condition is heart failure. It may happen suddenly (acute) or gradually over time (chronic) as many traumas or attacks damage the heart.  Common causes of acute cardiogenic shock include:  Heart attack.  Fluid buildup around the heart that accumulates quickly.  Trauma to the heart.  Heart valve problems.  Certain medicines, including medicines that are poisonous (toxic) to the heart.  A tumor in the heart.  Infection of the heart muscle.  A blood clot that has traveled to a lung (pulmonary embolism).  Abnormal heart rhythm.  Viral infection of the heart.  Inflammation of the heart muscle. Pump failure and cardiogenic shock can develop over time in people who have:  High blood pressure (hypertension).  Diabetes.  A history of heart attacks or other heart problems.  Coronary artery disease.  Exposure to medicines that are toxic to the heart.  Recurrent pulmonary embolism.  Genetic defects (abnormalities) in the heart muscle. What are the signs or symptoms? Signs and symptoms of this condition include:  Low blood pressure (hypotension).  Sweating.  Urinating less often or a lesser amount than normal.  Nausea.  Fainting.  Weakness.  Pale skin.  Cold hands or feet.  Shallow, quick breathing, or shortness of breath.  Confusion.  Weak pulse. How is this diagnosed? This condition may be diagnosed by:  Physical exam.  Medical history.  Tests, including: ? A check of your blood pressure, pulse, breathing rate,  and temperature. ? Blood tests. ? Chest X-ray. ? Electrocardiogram (ECG), which checks the electrical activity of your heart. ? Echocardiogram, which uses sound waves to make images of your heart. ? Cardiac catheterization, which checks how well your heart is working. ? Coronary angiogram, which takes X-ray images of your heart and blood vessels. How is this treated? Cardiogenic shock is a life-threatening condition that requires immediate medical care. Treatment helps to get blood flowing through your body again. Treatment may include:  Medicine to make your heart pump better.  Oxygen.  Use of a machine to help you breathe.  Use of a machine to help your heart pump (intra-aortic balloon pump). This device is made up of a small, thin tube (catheter) with a balloon. One end of the catheter is attached to a machine and the other is placed into the main artery that leads away from the heart (aorta).  Surgery. Depending on the cause of your shock, this may include: ? Coronary artery bypass graft. ? Stent placement (percutaneous coronary intervention). ? Valve repair or replacement. Get help right away if you:  Feel light-headed or dizzy.  Have chest pain.  Pass out.  Suddenly start to sweat, or your skin gets clammy.  Feel nauseous or you vomit.  Have shortness of breath.  Notice that your skin is pale. These symptoms may represent a serious problem that is an emergency. Do not wait to see if the symptoms will go away. Get medical help right away. Call your local emergency services (911 in the [place] Do not drive yourself to the hospital. Summary  Shock is a life-threatening condition.  It requires immediate medical care.  Cardiogenic shock is a type of shock that happens when your heart fails to pump blood effectively through your body. This is a result of heart failure.  Getting immediate medical care is important if you have cardiogenic shock. The goal of treatment is to get  blood flowing through your body again. It may include medicines, oxygen, and the use of machines to help you breathe and make your heart pump. Surgery may be needed. This information is not intended to replace advice given to you by your health care provider. Make sure you discuss any questions you have with your health care provider. Document Released: 10/12/2007 Document Revised: 11/23/2016 Document Reviewed: 11/23/2016 Elsevier Interactive Patient Education  2019 [employer]   Coronary Artery Disease, Female  Coronary artery disease (CAD) is a condition in which the arteries that lead to the heart (coronary arteries) become narrow or blocked. The narrowing or blockage can lead to decreased blood flow to the heart. Prolonged reduced blood flow can cause a heart attack (myocardial infarction or MI). This condition may also be called coronary heart disease. Because CAD is the leading cause of death in women, it is important to understand what causes this condition and how it is treated. What are the causes? CAD is most often caused by atherosclerosis. This is the buildup of fat and cholesterol (plaque) on the inside of the arteries. Over time, the plaque may narrow or block the artery, reducing blood flow to the heart. Plaque can also become weak and break off within a coronary artery and cause a sudden blockage. Other less common causes of CAD include:  An embolism or blood clot in a coronary artery.  A tearing of the artery (spontaneous coronary artery dissection).  An aneurysm.  Inflammation (vasculitis) in the artery wall. What increases the risk? The following factors may make you more likely to develop this condition:  Age. Women over age 62 are at a greater risk of CAD.  Family history of CAD.  High blood pressure (hypertension).  Diabetes.  High cholesterol levels.  Tobacco use.  Lack of exercise.  Menopause. ? All postmenopausal women are at greater risk of  CAD. ? Women who have experienced menopause between the ages of 43-45 (early menopause) are at a higher risk of CAD. ? Women who have experienced menopause before age 59 (premature menopause) are at a very high risk of CAD.  Excessive alcohol use  A diet high in saturated and trans fats, such as fried food and processed meat. Other possible risk factors include:  High stress levels.  Depression  Obesity.  Sleep apnea. What are the signs or symptoms? Many people do not have any symptoms during the early stages of CAD. As the condition progresses, symptoms may include:  Chest pain (angina). The pain can: ? Feel like crushing or squeezing, or a tightness, pressure, fullness, or heaviness in the chest. ? Last more than a few minutes or can stop and recur. The pain tends to get worse with exercise or stress and to fade with rest.  Pain in the arms, neck, jaw, or back.  Unexplained heartburn or indigestion.  Shortness of breath.  Nausea.  Sudden cold sweats.  Sudden light-headedness.  Fluttering or fast heartbeat (palpitations). Many women have chest discomfort and the other symptoms. However, women often have unusual (atypical) symptoms, such as:  Fatigue.  Vomiting.  Unexplained feelings of nervousness or anxiety.  Unexplained weakness.  Dizziness or fainting. How is this  diagnosed? This condition is diagnosed based on:  Your family and medical history.  A physical exam.  Tests, including: ? A test to check the electrical signals in your heart (electrocardiogram). ? Exercise stress test. This looks for signs of blockage when the heart is stressed with exercise, such as running on a treadmill. ? Pharmacologic stress test. This test looks for signs of blockage when the heart is being stressed with a medicine. ? Blood tests. ? Coronary angiogram. This is a procedure to look at the coronary arteries to see if there is any blockage. During this test, a dye is  injected into your arteries so they appear on an X-ray. ? A test that uses sound waves to take a picture of your heart (echocardiogram). ? Chest X-ray. How is this treated? This condition may be treated by:  Healthy lifestyle changes to reduce risk factors.  Medicines such as: ? Antiplatelet medicines and blood-thinning medicines, such as aspirin. These help prevent blood clots. ? Nitroglycerin. ? Blood pressure medicines. ? Cholesterol-lowering medicine.  Coronary angioplasty and stenting. During this procedure, a thin, flexible tube is inserted through a blood vessel and into a blocked artery. A balloon or similar device on the end of the tube is inflated to open up the artery. In some cases, a small, mesh tube (stent) is inserted into the artery to keep it open.  Coronary artery bypass surgery. During this surgery, veins or arteries from other parts of the body are used to create a bypass around the blockage and allow blood to reach your heart. Follow these instructions at home: Medicines  Take over-the-counter and prescription medicines only as told by your health care provider.  Do not take the following medicines unless your health care provider approves: ? NSAIDs, such as ibuprofen, naproxen, or celecoxib. ? Vitamin supplements that contain vitamin A, vitamin E, or both. ? Hormone replacement therapy that contains estrogen with or without progestin. Lifestyle  Follow an exercise program approved by your health care provider. Aim for 150 minutes of moderate exercise or 75 minutes of vigorous exercise each week.  Maintain a healthy weight or lose weight as approved by your health care provider.  Rest when you are tired.  Learn to manage stress or try to limit your stress. Ask your health care provider for suggestions if you need help.  Get screened for depression and seek treatment, if needed.  Do not use any products that contain nicotine or tobacco, such as cigarettes and  e-cigarettes. If you need help quitting, ask your health care provider.  Do not use illegal drugs. Eating and drinking  Follow a heart-healthy diet. A dietitian can help educate you about healthy food options and changes. In general, eat plenty of fruits and vegetables, lean meats, and whole grains.  Avoid foods high in: ? Sugar. ? Salt (sodium). ? Saturated fats, such as processed or fatty meat. ? Trans fats, such as fried food.  Use healthy cooking methods such as roasting, grilling, broiling, baking, poaching, steaming, or stir-frying.  If you drink alcohol, and your health care provider approves, limit your alcohol intake to no more than 1 drink per day. One drink equals 12 ounces of beer, 5 ounces of wine, or 1 ounces of hard liquor. General instructions  Manage any other health conditions, such as hypertension and diabetes. These conditions affect your heart.  Your health care provider may ask you to monitor your blood pressure. Ideally, your blood pressure should be below 130/80.  Keep  all follow-up visits as told by your health care provider. This is important. Get help right away if:  You have pain in your chest, neck, arm, jaw, stomach, or back that: ? Lasts more than a few minutes. ? Is recurring. ? Is not relieved by taking medicine under your tongue (sublingualnitroglycerin).  You have profuse sweating without cause.  You have unexplained: ? Heartburn or indigestion. ? Shortness of breath or difficulty breathing. ? Fluttering or fast heartbeat (palpitations). ? Nausea or vomiting. ? Fatigue. ? Feelings of nervousness or anxiety. ? Weakness. ? Diarrhea.  You have sudden light-headedness or dizziness.  You faint.  You feel like hurting yourself or think about taking your own life. These symptoms may represent a serious problem that is an emergency. Do not wait to see if the symptoms will go away. Get medical help right away. Call your local emergency  services (911 in the [place] Do not drive yourself to the hospital. Summary  Coronary artery disease (CAD) is a process in which the arteries that lead to the heart (coronary arteries) become narrow or blocked. The narrowing or blockage can lead to a heart attack.  Many women have chest discomfort and other common symptoms of CAD. However, women often have different (atypical) symptoms, such as fatigue, vomiting, and dizziness or weakness.  CAD can be treated with lifestyle changes, medicines, surgery, or a combination of these treatments. This information is not intended to replace advice given to you by your health care provider. Make sure you discuss any questions you have with your health care provider. Document Released: 03/27/2011 Document Revised: 12/24/2015 Document Reviewed: 12/24/2015 Elsevier Interactive Patient Education  2019 [employer].

## 2018-03-08 NOTE — Discharge Summary (Addendum)
The patient has been seen in conjunction with Vin Bhagat, PAC. All aspects of care have been considered and discussed. The patient has been personally interviewed, examined, and all clinical data has been reviewed.   Inferior infarct, treated with RCA stent, therapy with dual antiplatelet therapy, subsequent upper GI hemorrhage requiring transfusion.  Source felt to be gastric AVM.  Aspirin therapy discontinued.  Brilinta therapy is continued.  Proton pump inhibitor therapy being used.  Hemoglobin has been relatively stable over 72 hours running above 8 g.  Initial recommendation for skilled nursing facility rehab.  Patient and family reconsidered and strongly desired for patient to come home.  Plan discharge today, transition of care follow-up in 5 to 7 days with hemoglobin.  Please see my note from earlier in the day.   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  A drug-eluting stent was successfully placed using a STENT SYNERGY DES 2.5X38, postdilated to > 3.0 mm.  Post intervention, there is a 0% residual stenosis.  2nd Diag lesion is 40% stenosed.  Ost 2nd Diag lesion is 50% stenosed.  The left ventricular systolic function is normal.  LV end diastolic pressure is normal.  The left ventricular ejection fraction is 55-65% by visual  estimate.  There is no aortic valve stenosis.  A drug-eluting stent was successfully placed using a STENT SYNERGY DES 2.5X38.  Continue dual antiplatelet therapy along with aggressive secondary prevention. Hopefully, she can be extubated later today.   I anticipate she will be in the hospital for 2-3 days. Check echocardiogram to assess for mitral regurgitation.   Hematoma noted in the right wrist/forearm. All anticoagulation has been stopped. Continue compression.  TTE: 03/01/2018  1. The left ventricle has hyperdynamic systolic function, with an ejection fraction of >65%. The cavity size was normal. Severe basal septal hypertrophy of the septal wall. Left ventricular diastolic Doppler parameters are consistent with impaired  relaxation Elevated left ventricular end-diastolic pressure The E/e' is 26.2. No evidence of left ventricular regional wall motion abnormalities. 2. There is dynamic mid cavitary LV obstruction at rest up to 53mmHg. 3. No evidence of left ventricular regional wall motion abnormalities. 4. The right ventricle has normal systolic function. The cavity was normal. There is no increase in right ventricular wall thickness. 5. The anterior MV leaflet appears thickened at the tip. Cannot rule out myxomatous degeneration. Mitral valve regurgitation is moderate by color flow Doppler. The MR jet is eccentric laterally directed. 6. Consider TEE for further evaluation of MV. 7. The tricuspid valve is normal in structure. 8. The aortic valve has an indeterminant number of cusps Moderate calcification of the aortic valve. mild stenosis of the aortic valve. 9. The pulmonic valve was normal in structure. 10. The aortic root and ascending aorta are normal in size and structure. 11. Right atrial pressure is estimated at 3 mmHg.  History of Present Illness     [name] is a 78 y.o. female with history of CKD Cr 1.4, rheumatoid arthritis, seizures who  presented 2/13 with STEMI.  Patient reportedly fell at home and vomited.  Family moved her to a chair and called EMS.  Initial BP was 86/52 with HR 50.  She was unresponsive, so EMS initiated bagging and transported her to the ED.  In ER, patient was hypotensive to 70/60 on arrival.  She was intubated in the ED due to concern for airway compromise due to vomiting and AMS.  BP improved with aggressive IV fluid resuscitation. Inferior ST elevations and lateral ST depressions noted on ECG.  She was given PR aspirin and IV heparin, and transported to the cath lab.  Hospital Course     Consultants: GI & Critical care  1. Inferior STEMI - She was found to have an occluded proximal RCA with moderate to severe focal disease past the initial occlusion.  The right coronary artery was treated with a drug-eluting stent, 2.5 x 38 Synergy, postdilated to greater than 3 mm in diameter. Right radial site complicated by bleeding. Echo with EF > 65%, LV mid-cavity gradient, normal RV, moderate MR.Placed onDAPT with Ticagrelor 90 mg QD and ASA 81 mg QD. ASA has been stopped 2/2 to GI bleed. No recurrent pain. Continue monotherapy with Brillinta, Statin and BB.   2. Anemia - Hgb dropped from 12.1>>8.4>>7.5 post procedure requiring 1 unit of PRBCs. + FOBT. Seen by GI and underwent endoscopy which showed gastric AVM clipped x3 and had epinephrine injections because of bleeding. Discontinued ASA. Hemoglobin is relatively stable post transfusion although has gradually decreased from 10.4 on 03/06/2018 -->8.8 -->8.4 today. Will recheck Monday.   3. HTN - BP intermittently elevated. Requiring multiple adjustment.   4. AKI on CKD stage 3: -  Creatinine baseline around 1.3-1.4>> increased to 1.93 then trended down. Avoid nephrotoxic agent.   5. Deconditioning - PT recommended SNF however declined. She will go on [facility].   The patient been seen by Dr. Tamala Julian  today and deemed ready for discharge home. All follow-up  appointments have been scheduled. Discharge medications are listed below.   Discharge Vitals Blood pressure 124/69, pulse 73, temperature 98.6 F (37 C), temperature source Oral, resp. rate 19, height 5\' 2"  (1.575 m), weight 49.1 kg, SpO2 100 %.  Filed Weights   03/06/18 0457 03/07/18 0415 03/08/18 0559  Weight: 47.5 kg 47.9 kg 49.1 kg    Labs & Radiologic Studies    CBC Recent Labs    03/07/18 0327 03/08/18 0500  WBC 18.0* 12.5*  HGB 8.8* 8.4*  HCT 27.4* 26.6*  MCV 83.3 83.6  PLT 322 962   Basic Metabolic Panel Recent Labs    03/07/18 0327 03/08/18 0500  NA 139 139  K 3.5 3.3*  CL 111 113*  CO2 18* 19*  GLUCOSE 99 89  BUN 28* 23  CREATININE 1.93* 1.63*  CALCIUM 7.9* 7.4*   _____________  Dg Chest 1 View  Result Date: 03/02/2018 CLINICAL DATA:  Coughing blood EXAM: CHEST  1 VIEW COMPARISON:  February 28, 2018 FINDINGS: The heart size and mediastinal contours are within normal limits. The aorta is slightly uncoiled. There is no focal infiltrate, pulmonary edema, or pleural effusion. The visualized skeletal structures are stable. IMPRESSION: No active cardiopulmonary disease. Electronically Signed   By: Abelardo Diesel M.D.   On: 03/02/2018 02:41   Portable Chest Xray  Result Date: 02/28/2018 CLINICAL DATA:  Respiratory failure EXAM: PORTABLE CHEST 1 VIEW COMPARISON:  Yesterday FINDINGS: Endotracheal tube tip is 2 cm above the carina. The orogastric tube tip reaches the stomach. Mild retrocardiac linear atelectasis. Prominent heart size accentuated by technique. No edema, effusion, or pneumothorax. IMPRESSION: Stable hardware positioning and mild retrocardiac atelectasis. Electronically Signed   By: Monte Fantasia M.D.   On: 02/28/2018 09:37   Dg Chest Portable 1 View  Result Date: 02/28/2018 CLINICAL DATA:  Intubation EXAM: PORTABLE CHEST 1 VIEW COMPARISON:  None. FINDINGS: Support Apparatus: --Endotracheal tube: Tip just above the carina. Retraction by 4-5 cm  recommended. --Enteric tube:Side port is just below the gastroesophageal junction. Recommend advancement by 7 cm. --Catheter(s):None --Other: None The heart size and mediastinal contours are within normal limits. The lungs are clear. No pleural effusion or pneumothorax. IMPRESSION: 1. Tip of the endotracheal tube just above the carina. Retraction by 4-5 cm recommended. 2. Enteric tube side port is just below the gastroesophageal junction. Recommend advancement by 7 cm. 3. Clear lungs. Electronically Signed   By: Ulyses Jarred M.D.   On: 02/28/2018 00:20   Dg Abd Portable 1v  Result Date: 03/02/2018 CLINICAL DATA:  Nasogastric tube placement. EXAM: PORTABLE ABDOMEN - 1 VIEW COMPARISON:  None. FINDINGS: The bowel gas pattern is normal. Nasogastric tube is identified distal tip in the proximal to mid stomach. Prior cholecystectomy clips are noted. Moderate bowel content is identified in the colon. IMPRESSION: Nasogastric tube is identified distal tip in the proximal to mid stomach. Electronically Signed   By: Abelardo Diesel M.D.   On: 03/02/2018 01:38   Disposition   Pt is being discharged home today in good condition.  Follow-up Plans & Appointments    Follow-up Information    Care, [facility] Follow up.   Specialty:  Home Health Services Why:  Registered Nurse, Physical Therapy, Aide-office to call within 24-48 hours post transition home.  Contact information: [place] [address] [phone]        Leanor Kail, [place]. Go on 03/19/2018.   Specialty:  Cardiology Why:  @9am  for hospital follow up. Please arrive 15 minutes early Contact information: [address] [place] [address] [phone]        [facility] [address]. Go on 03/11/2018.   Specialty:  Cardiology Why:  between 7:30 to 5 am for hemoglobin check  Contact information: [address] [place] [phone]          Discharge Instructions    AMB Referral to Between Management   Complete by:  As directed    Please refer to community social worker for post hospital follow up. Patient states needs assistance with meals and transportation.  Netta Cedars, MSN, RN [facility] Liaison Nurse Mobile Phone [phone]  Toll free office [phone]   Reason for consult:  [facility] Follow up   Diagnoses of:  Heart Failure   Expected date of contact:  1-3 days (reserved for hospital discharges)   Amb Referral to Cardiac Rehabilitation   Complete by:  As directed    Diagnosis:   STEMI Coronary Stents     Diet - low sodium heart healthy   Complete by:  As directed    Discharge instructions   Complete by:  As directed    No driving for 2 weeks. No lifting over 10 lbs for 4 weeks. No sexual activity for 4 weeks. You may not return to  work until cleared by your cardiologist. Keep procedure site clean & dry. If you notice increased pain, swelling, bleeding or pus, call/return!  You may shower, but no soaking baths/hot tubs/pools for 1 week.   Increase activity slowly   Complete by:  As directed       Discharge Medications   Allergies as of 03/08/2018   No Known Allergies     Medication List    STOP taking these medications   aspirin EC 81 MG tablet   naproxen sodium 220 MG tablet Commonly known as:  ALEVE     TAKE these medications   atorvastatin 80 MG tablet Commonly known as:  LIPITOR Take 1 tablet (80 mg total) by mouth daily at 6 PM.   carvedilol 12.5 MG tablet Commonly known as:  COREG Take 1 tablet (12.5 mg total) by mouth 2 (two) times daily with a meal. What changed:    medication strength  how much to take   hydroxychloroquine 200 MG tablet Commonly known as:  PLAQUENIL Take 200 mg by mouth daily.   levETIRAcetam 500 MG tablet Commonly known as:  KEPPRA Take 2 tablets (1,000 mg total) by mouth daily.   nitroGLYCERIN 0.4 MG SL tablet Commonly known  as:  NITROSTAT Place 1 tablet (0.4 mg total) under the tongue every 5 (five) minutes as needed.   ondansetron 4 MG tablet Commonly known as:  ZOFRAN Take 1 tablet (4 mg total) by mouth every 8 (eight) hours as needed for nausea or vomiting.   pantoprazole 40 MG tablet Commonly known as:  PROTONIX Take 1 tablet (40 mg total) by mouth daily. Start taking on:  March 09, 2018   ticagrelor 90 MG Tabs tablet Commonly known as:  BRILINTA Take 1 tablet (90 mg total) by mouth 2 (two) times daily.   Vitamin D (Ergocalciferol) 1.25 MG (50000 UT) Caps capsule Commonly known as:  DRISDOL Take 1 capsule (50,000 Units total) by mouth every 7 (seven) days.        Acute coronary syndrome (MI, NSTEMI, STEMI, etc) this admission?: Yes.     AHA/ACC Clinical Performance & Quality Measures: 7. Aspirin prescribed? - No Discontinued due to GI bleed 8. ADP Receptor Inhibitor (Plavix/Clopidogrel, Brilinta/Ticagrelor or Effient/Prasugrel) prescribed (includes medically managed patients)? - Yes 9. Beta Blocker prescribed? - Yes 10. High Intensity Statin (Lipitor 40-80mg  or Crestor 20-40mg ) prescribed? - Yes 11. EF assessed during THIS hospitalization? - Yes 12. For EF <40%, was ACEI/ARB prescribed? - Not Applicable (EF >/= 14%) 13. For EF <40%, Aldosterone Antagonist (Spironolactone or Eplerenone) prescribed? - Not Applicable (EF >/= 43%) 14. Cardiac Rehab Phase II ordered (Included Medically managed Patients)? - Yes     Outstanding Labs/Studies   CBC on Monday CBC and BMET at follow up   Duration of Discharge Encounter   Greater than 30 minutes including physician time.  [place] Longest [place], PA 03/08/2018, 1:14 PM

## 2018-03-08 NOTE — Progress Notes (Signed)
Called patient's husband to notify of d/c order.  He will be here at 1630hrs to take her home

## 2018-03-08 NOTE — Telephone Encounter (Signed)
° ° °  Bhagat requested TOC , scheduled 3/3

## 2018-03-08 NOTE — Progress Notes (Signed)
Progress Note  Patient Name: [name] Date of Encounter: 03/08/2018  Primary Cardiologist: Larae Grooms, MD   Subjective   She feels better.  She has been able to ambulate increasingly per patient and physical therapy notes.  She wants to go home but plan remains SNF for rehab prior to returning home.  She denies chest pain.  No abdominal pain.  No blood in stool.  Inpatient Medications    Scheduled Meds: . atorvastatin  80 mg Oral q1800  . carvedilol  12.5 mg Oral BID WC  . chlorhexidine  15 mL Mouth Rinse BID  . levETIRAcetam  1,000 mg Oral Daily  . mouth rinse  15 mL Mouth Rinse q12n4p  . pantoprazole  40 mg Oral Daily  . potassium chloride  40 mEq Oral Once  . sodium chloride flush  3 mL Intravenous Q12H  . ticagrelor  90 mg Oral BID   Continuous Infusions: . sodium chloride    . sodium chloride Stopped (03/01/18 1711)  . sodium chloride 50 mL/hr at 03/08/18 0730   PRN Meds: sodium chloride, acetaminophen, hydrALAZINE, metoprolol tartrate, ondansetron (ZOFRAN) IV, ondansetron, sodium chloride flush   Vital Signs    Vitals:   03/07/18 0853 03/07/18 1641 03/07/18 2108 03/08/18 0559  BP: (!) 97/59 (!) 119/56 135/65 110/80  Pulse: 79 71 84 76  Resp:   17 19  Temp:   98.6 F (37 C) 98.6 F (37 C)  TempSrc:   Oral Oral  SpO2:   99% 96%  Weight:    49.1 kg  Height:        Intake/Output Summary (Last 24 hours) at 03/08/2018 0350 Last data filed at 03/08/2018 0938 Gross per 24 hour  Intake 1405.15 ml  Output 600 ml  Net 805.15 ml   Last 3 Weights 03/08/2018 03/07/2018 03/06/2018  Weight (lbs) 108 lb 3.9 oz 105 lb 11.2 oz 104 lb 11.5 oz  Weight (kg) 49.1 kg 47.945 kg 47.5 kg      Telemetry    Sinus rhythm and sinus bradycardia- Personally Reviewed  ECG    No new data- Personally Reviewed  Physical Exam  Frail African-American female in no acute distress. GEN: No acute distress.   Neck: No JVD Cardiac: RRR, no murmurs, rubs, or gallops.    Respiratory: Clear to auscultation bilaterally. GI: Soft, nontender, non-distended  MS: No edema; No deformity. Neuro:  Nonfocal  Psych: Normal affect   Labs    Chemistry Recent Labs  Lab 03/05/18 0432 03/07/18 0327 03/08/18 0500  NA 143 139 139  K 3.8 3.5 3.3*  CL 118* 111 113*  CO2 17* 18* 19*  GLUCOSE 105* 99 89  BUN 26* 28* 23  CREATININE 1.73* 1.93* 1.63*  CALCIUM 8.1* 7.9* 7.4*  GFRNONAA 28* 24* 30*  GFRAA 32* 28* 34*  ANIONGAP 8 10 7      Hematology Recent Labs  Lab 03/06/18 0337 03/07/18 0327 03/08/18 0500  WBC 19.6* 18.0* 12.5*  RBC 3.86* 3.29* 3.18*  HGB 10.4* 8.8* 8.4*  HCT 31.9* 27.4* 26.6*  MCV 82.6 83.3 83.6  MCH 26.9 26.7 26.4  MCHC 32.6 32.1 31.6  RDW 16.2* 16.0* 15.9*  PLT 345 322 354    Cardiac Enzymes Recent Labs  Lab 03/01/18 0909  TROPONINI 6.05*   No results for input(s): TROPIPOC in the last 168 hours.   BNPNo results for input(s): BNP, PROBNP in the last 168 hours.   DDimer No results for input(s): DDIMER in the last 168  hours.    Radiology    No results found.  Cardiac Studies   No recent studies  Patient Profile     78 y.o. female CKD and RQ who presented to the ED via EMS for an inferior STEMI. She was subsequently intubated for airway protection and PCI was performed.  Subsequent massive GI bleed requiring transfusion.  Assessment & Plan    1. Inferior ST elevation myocardial infarction treated with RCA stent: She is doing well without recurrent angina.  She is on monotherapy with Brilinta due to significant GI bleed 48 hours post PCI.  Required transfusion.  Bleeding secondary to AV malformation and gastric region. 2. Hemorrhagic anemia, due to presumed bleeding from gastric AVM.  Aspirin has been discontinued from dual antiplatelet therapy regimen.  Hemoglobin is relatively stable although has gradually decreased from 10.4 on 03/06/2018 -->8.8 -->8.4 today. 3. Hypokalemia will be treated with 40 mEq of potassium  this morning. 4. Acute kidney injury is resolving.  The patient is ready for discharge today or tomorrow to a skilled nursing facility.  Will need to have a hemoglobin done in 72 hours.  Will need cardiology follow-up in 7 days with hemoglobin at that time as well.  For questions or updates, please contact [place] Please consult [email or web address] for contact info under        Signed, Sinclair Grooms, MD  03/08/2018, 8:07 AM

## 2018-03-11 ENCOUNTER — Telehealth (HOSPITAL_COMMUNITY): Payer: Self-pay

## 2018-03-11 ENCOUNTER — Telehealth: Payer: Self-pay

## 2018-03-11 ENCOUNTER — Telehealth: Payer: Self-pay | Admitting: Interventional Cardiology

## 2018-03-11 ENCOUNTER — Other Ambulatory Visit: Payer: Self-pay | Admitting: *Deleted

## 2018-03-11 ENCOUNTER — Other Ambulatory Visit: Payer: Medicare PPO

## 2018-03-11 NOTE — Telephone Encounter (Signed)
Left the a message so that I can schedule her an appointment for a hospital follow-up and do her transition of care (TCM).

## 2018-03-11 NOTE — Patient Outreach (Signed)
[place] [facility]) Care Management  03/11/2018  [name] Jun 11, 1940 [id number]   Referral received from hospital liaison as member was recently admitted to hospital for STEMI, discharged 2/21.  Per chart, she also has history of hypertension, hyperparathyroidism, chronic kidney disease, hyperlipidemia, and depression.  Call placed to member for hospital follow up, no answer.  HIPAA compliant voice message left, unsuccessful outreach letter sent to member.  Will follow up within the next 4 business days.  Valente David, [place], MSN [place] [phone]

## 2018-03-11 NOTE — Telephone Encounter (Signed)
Pt insurance is active and benefits verified through [place] $20.00, DED $500.00/0 met, out of pocket $10,000/$3,300 met, co-insurance 0. no pre-authorization required. Passport, 03/11/2018 @ 8:18am, REF# [id number]  Will contact patient to see if she is interested in the Cardiac Rehab Program. If interested, patient will need to complete follow up appt. Once completed, patient will be contacted for scheduling upon review by the RN Navigator.

## 2018-03-11 NOTE — Telephone Encounter (Signed)
lpmtcb 1st attempt TCM 2/24

## 2018-03-11 NOTE — Telephone Encounter (Signed)
New Message    Peter Congo with [facility] is calling in reference to a referral they received signed by Richardson Dopp. She states that it has to be signed by a MD. Please call.

## 2018-03-11 NOTE — Telephone Encounter (Signed)
Called patient to see if she is interested in the Cardiac Rehab Program. Left a vm Tedra [place]. Support Rep II

## 2018-03-11 NOTE — Telephone Encounter (Signed)
Referral for Cardiac Rehab sent in via Epic.

## 2018-03-12 NOTE — Progress Notes (Signed)
Transitions of Care Follow Up Call Note  [name] is an 78 y.o. female who presented to [facility] on 02/27/2018.  The patient had the following prescriptions filled at [facility]: atorvastatin, pantoprazole, Brilinta, nitroglycerin  Patient was called by pharmacist and HIPAA identifiers were verified. The following questions were asked about the prescriptions filled at [facility]:None  Has the patient been experiencing any side effects to the medications prescribed? no Understanding of regimen: good Understanding of indications: good Potential of compliance: good  Pharmacist comments: No questions at this time   [x]  Patient's prescriptions filled at the [facility] Transitions of Care Pharmacy were transferred to the following pharmacy: [facility] [address]  []  Patient unable to be reached after calling three times and prescriptions filled at the [facility] Transitions of Care Pharmacy were transferred to preferred pharmacy found within their chart.   Lorenda Ishihara 03/12/2018, 5:26 PM Transitions of Care Pharmacy Hours: Monday - Friday 8:30am to 5:00 PM  Phone - [phone]

## 2018-03-12 NOTE — Telephone Encounter (Signed)
2nd attempt made:  Left the pt a message to call the office back and request to speak with a triage nurse, for TCM follow-up.

## 2018-03-13 DIAGNOSIS — I129 Hypertensive chronic kidney disease with stage 1 through stage 4 chronic kidney disease, or unspecified chronic kidney disease: Secondary | ICD-10-CM | POA: Diagnosis not present

## 2018-03-13 DIAGNOSIS — J9600 Acute respiratory failure, unspecified whether with hypoxia or hypercapnia: Secondary | ICD-10-CM | POA: Diagnosis not present

## 2018-03-13 DIAGNOSIS — I2119 ST elevation (STEMI) myocardial infarction involving other coronary artery of inferior wall: Secondary | ICD-10-CM | POA: Diagnosis not present

## 2018-03-13 DIAGNOSIS — N184 Chronic kidney disease, stage 4 (severe): Secondary | ICD-10-CM | POA: Diagnosis not present

## 2018-03-13 DIAGNOSIS — Z955 Presence of coronary angioplasty implant and graft: Secondary | ICD-10-CM

## 2018-03-13 DIAGNOSIS — I080 Rheumatic disorders of both mitral and aortic valves: Secondary | ICD-10-CM | POA: Diagnosis not present

## 2018-03-13 DIAGNOSIS — N179 Acute kidney failure, unspecified: Secondary | ICD-10-CM | POA: Diagnosis not present

## 2018-03-13 DIAGNOSIS — Z48812 Encounter for surgical aftercare following surgery on the circulatory system: Secondary | ICD-10-CM | POA: Diagnosis not present

## 2018-03-13 DIAGNOSIS — G40909 Epilepsy, unspecified, not intractable, without status epilepticus: Secondary | ICD-10-CM

## 2018-03-13 DIAGNOSIS — D62 Acute posthemorrhagic anemia: Secondary | ICD-10-CM | POA: Diagnosis not present

## 2018-03-13 DIAGNOSIS — M069 Rheumatoid arthritis, unspecified: Secondary | ICD-10-CM | POA: Diagnosis not present

## 2018-03-13 DIAGNOSIS — Z9181 History of falling: Secondary | ICD-10-CM

## 2018-03-13 DIAGNOSIS — K31811 Angiodysplasia of stomach and duodenum with bleeding: Secondary | ICD-10-CM | POA: Diagnosis not present

## 2018-03-13 DIAGNOSIS — Z8674 Personal history of sudden cardiac arrest: Secondary | ICD-10-CM

## 2018-03-14 ENCOUNTER — Other Ambulatory Visit: Payer: Self-pay | Admitting: *Deleted

## 2018-03-14 DIAGNOSIS — J9600 Acute respiratory failure, unspecified whether with hypoxia or hypercapnia: Secondary | ICD-10-CM | POA: Diagnosis not present

## 2018-03-14 DIAGNOSIS — N179 Acute kidney failure, unspecified: Secondary | ICD-10-CM | POA: Diagnosis not present

## 2018-03-14 DIAGNOSIS — D62 Acute posthemorrhagic anemia: Secondary | ICD-10-CM | POA: Diagnosis not present

## 2018-03-14 DIAGNOSIS — I2119 ST elevation (STEMI) myocardial infarction involving other coronary artery of inferior wall: Secondary | ICD-10-CM | POA: Diagnosis not present

## 2018-03-14 DIAGNOSIS — K31811 Angiodysplasia of stomach and duodenum with bleeding: Secondary | ICD-10-CM | POA: Diagnosis not present

## 2018-03-14 DIAGNOSIS — Z48812 Encounter for surgical aftercare following surgery on the circulatory system: Secondary | ICD-10-CM | POA: Diagnosis not present

## 2018-03-14 NOTE — Patient Outreach (Signed)
[facility]) Care Management  03/14/2018  [name] August 05, 1940 [id number]   Call received from member's husband John.  Member's identity verified.  He expresses concern about being contacted by home health for services that were to start yesterday but no one ever showed.  Per discharge instructions, member is to have [name] for home health.  Provided contact information, advised to call immediately as it is close to the end of the business day.  This care manager will follow up tomorrow regarding contact with [facility] and for transition of care.  Valente David, [place], MSN [place] [phone]

## 2018-03-15 ENCOUNTER — Other Ambulatory Visit: Payer: Self-pay | Admitting: *Deleted

## 2018-03-15 NOTE — Patient Outreach (Signed)
[facility]) Care Management  03/15/2018  [name] [birth year] [id number]   Call placed to member's husband/caregiver to follow up on contact with home health services.  He report he was mistaken and [facility] services has already started.  [facility] care management services explained, he state he feel member has all support needed at this time.  He and his granddaughter provide assistance to member for health care.  Assessed the need for social worker and pharmacy assistance, he denies the need.  Benefits of [facility] again explained, he declines involvement but state "I know who to call if I need help."    Will close case at this time and notify primary MD of closure.  Valente David, [place], MSN [place] [phone]

## 2018-03-18 ENCOUNTER — Encounter: Payer: Self-pay | Admitting: Internal Medicine

## 2018-03-18 ENCOUNTER — Other Ambulatory Visit: Payer: Medicare PPO

## 2018-03-18 ENCOUNTER — Other Ambulatory Visit: Payer: Self-pay | Admitting: Internal Medicine

## 2018-03-18 DIAGNOSIS — M069 Rheumatoid arthritis, unspecified: Secondary | ICD-10-CM

## 2018-03-18 DIAGNOSIS — I2119 ST elevation (STEMI) myocardial infarction involving other coronary artery of inferior wall: Secondary | ICD-10-CM | POA: Diagnosis not present

## 2018-03-18 DIAGNOSIS — D62 Acute posthemorrhagic anemia: Secondary | ICD-10-CM | POA: Diagnosis not present

## 2018-03-18 DIAGNOSIS — N179 Acute kidney failure, unspecified: Secondary | ICD-10-CM | POA: Diagnosis not present

## 2018-03-18 DIAGNOSIS — J9600 Acute respiratory failure, unspecified whether with hypoxia or hypercapnia: Secondary | ICD-10-CM | POA: Diagnosis not present

## 2018-03-18 DIAGNOSIS — I2111 ST elevation (STEMI) myocardial infarction involving right coronary artery: Secondary | ICD-10-CM

## 2018-03-18 DIAGNOSIS — K31811 Angiodysplasia of stomach and duodenum with bleeding: Secondary | ICD-10-CM | POA: Diagnosis not present

## 2018-03-18 DIAGNOSIS — Z48812 Encounter for surgical aftercare following surgery on the circulatory system: Secondary | ICD-10-CM | POA: Diagnosis not present

## 2018-03-19 ENCOUNTER — Ambulatory Visit: Payer: Self-pay

## 2018-03-19 ENCOUNTER — Ambulatory Visit: Payer: Medicare PPO | Admitting: Physician Assistant

## 2018-03-19 DIAGNOSIS — I2119 ST elevation (STEMI) myocardial infarction involving other coronary artery of inferior wall: Secondary | ICD-10-CM | POA: Diagnosis not present

## 2018-03-19 DIAGNOSIS — N179 Acute kidney failure, unspecified: Secondary | ICD-10-CM | POA: Diagnosis not present

## 2018-03-19 DIAGNOSIS — D62 Acute posthemorrhagic anemia: Secondary | ICD-10-CM | POA: Diagnosis not present

## 2018-03-19 DIAGNOSIS — J9600 Acute respiratory failure, unspecified whether with hypoxia or hypercapnia: Secondary | ICD-10-CM | POA: Diagnosis not present

## 2018-03-19 DIAGNOSIS — Z48812 Encounter for surgical aftercare following surgery on the circulatory system: Secondary | ICD-10-CM | POA: Diagnosis not present

## 2018-03-19 DIAGNOSIS — K31811 Angiodysplasia of stomach and duodenum with bleeding: Secondary | ICD-10-CM | POA: Diagnosis not present

## 2018-03-19 NOTE — Chronic Care Management (AMB) (Signed)
  Care Management Note   [name] is a 78 y.o. year old female who is a primary care patient of Glendale Chard, MD . Dr. Baird Cancer asked the CM team to consult with the patient for assistance with disease education and transportation resources.  Review of patient status, including review of consultants reports, rand collaboration with appropriate care team members and the patient's provider was performed as part of comprehensive patient evaluation and provision of chronic care management services. Telephone outreach to patient today to introduce CCM services. Unfortunately, today's call was unsuccessful. SW left a HIPAA compliant voice message requesting a return call.   Follow Up Plan: Appointment scheduled for SW follow up with client by phone within the next week if no return call is received.   [name], BSW, CDP [facility] Care Management Social Worker [phone]

## 2018-03-20 DIAGNOSIS — J9600 Acute respiratory failure, unspecified whether with hypoxia or hypercapnia: Secondary | ICD-10-CM | POA: Diagnosis not present

## 2018-03-20 DIAGNOSIS — D62 Acute posthemorrhagic anemia: Secondary | ICD-10-CM | POA: Diagnosis not present

## 2018-03-20 DIAGNOSIS — K31811 Angiodysplasia of stomach and duodenum with bleeding: Secondary | ICD-10-CM | POA: Diagnosis not present

## 2018-03-20 DIAGNOSIS — Z48812 Encounter for surgical aftercare following surgery on the circulatory system: Secondary | ICD-10-CM | POA: Diagnosis not present

## 2018-03-20 DIAGNOSIS — I2119 ST elevation (STEMI) myocardial infarction involving other coronary artery of inferior wall: Secondary | ICD-10-CM | POA: Diagnosis not present

## 2018-03-20 DIAGNOSIS — N179 Acute kidney failure, unspecified: Secondary | ICD-10-CM | POA: Diagnosis not present

## 2018-03-22 ENCOUNTER — Ambulatory Visit: Payer: Self-pay

## 2018-03-22 ENCOUNTER — Other Ambulatory Visit: Payer: Self-pay | Admitting: Internal Medicine

## 2018-03-22 DIAGNOSIS — J9600 Acute respiratory failure, unspecified whether with hypoxia or hypercapnia: Secondary | ICD-10-CM | POA: Diagnosis not present

## 2018-03-22 DIAGNOSIS — N179 Acute kidney failure, unspecified: Secondary | ICD-10-CM | POA: Diagnosis not present

## 2018-03-22 DIAGNOSIS — N183 Chronic kidney disease, stage 3 unspecified: Secondary | ICD-10-CM

## 2018-03-22 DIAGNOSIS — Z48812 Encounter for surgical aftercare following surgery on the circulatory system: Secondary | ICD-10-CM | POA: Diagnosis not present

## 2018-03-22 DIAGNOSIS — D62 Acute posthemorrhagic anemia: Secondary | ICD-10-CM | POA: Diagnosis not present

## 2018-03-22 DIAGNOSIS — I2119 ST elevation (STEMI) myocardial infarction involving other coronary artery of inferior wall: Secondary | ICD-10-CM | POA: Diagnosis not present

## 2018-03-22 DIAGNOSIS — M069 Rheumatoid arthritis, unspecified: Secondary | ICD-10-CM

## 2018-03-22 DIAGNOSIS — K31811 Angiodysplasia of stomach and duodenum with bleeding: Secondary | ICD-10-CM | POA: Diagnosis not present

## 2018-03-22 NOTE — Chronic Care Management (AMB) (Signed)
  Chronic Care Management   Social Work Note  03/22/2018 Name: [name] MRN: [id number] DOB: 02-03-1940   Second unsuccessful outreach to the patient in an effort to discuss referral to the CCM program. SW left a HIPAA compliant voice message requesting a return call.   Follow Up Plan: SW will reach out to client by phone to follow up [place] referral to CCM program within the next week.  [name], BSW, CDP [facility] Care Management Social Worker [phone]  Total time spent performing care coordination and/or care management activities with the patient by phone or face to face = 5 minutes.

## 2018-03-26 ENCOUNTER — Ambulatory Visit (INDEPENDENT_AMBULATORY_CARE_PROVIDER_SITE_OTHER): Payer: Medicare PPO

## 2018-03-26 ENCOUNTER — Other Ambulatory Visit: Payer: Self-pay

## 2018-03-26 DIAGNOSIS — M069 Rheumatoid arthritis, unspecified: Secondary | ICD-10-CM | POA: Diagnosis not present

## 2018-03-26 DIAGNOSIS — I2119 ST elevation (STEMI) myocardial infarction involving other coronary artery of inferior wall: Secondary | ICD-10-CM | POA: Diagnosis not present

## 2018-03-26 DIAGNOSIS — N183 Chronic kidney disease, stage 3 unspecified: Secondary | ICD-10-CM

## 2018-03-26 DIAGNOSIS — D62 Acute posthemorrhagic anemia: Secondary | ICD-10-CM | POA: Diagnosis not present

## 2018-03-26 DIAGNOSIS — J9600 Acute respiratory failure, unspecified whether with hypoxia or hypercapnia: Secondary | ICD-10-CM | POA: Diagnosis not present

## 2018-03-26 DIAGNOSIS — K31811 Angiodysplasia of stomach and duodenum with bleeding: Secondary | ICD-10-CM | POA: Diagnosis not present

## 2018-03-26 DIAGNOSIS — N179 Acute kidney failure, unspecified: Secondary | ICD-10-CM | POA: Diagnosis not present

## 2018-03-26 DIAGNOSIS — Z48812 Encounter for surgical aftercare following surgery on the circulatory system: Secondary | ICD-10-CM | POA: Diagnosis not present

## 2018-03-26 DIAGNOSIS — I2111 ST elevation (STEMI) myocardial infarction involving right coronary artery: Secondary | ICD-10-CM

## 2018-03-26 MED ORDER — VITAMIN D 125 MCG (5000 UT) PO CAPS: 1.0000 | ORAL_CAPSULE | Freq: Every day | ORAL | 5 refills | Status: DC

## 2018-03-26 NOTE — Chronic Care Management (AMB) (Signed)
Chronic Care Management    Clinical Social Work General Note  03/26/2018 Name: [name] MRN: [id number] DOB: [birth year]  [name] is a 78 y.o. year old female who is a primary care patient of Glendale Chard, MD. The CCM was consulted to assist the patient with [employer] and [employer].   [name] was given information about Chronic Care Management services today including:  1. CCM service includes personalized support from designated clinical staff supervised by her physician, including individualized plan of care and coordination with other care providers 2. 24/7 contact phone numbers for assistance for urgent and routine care needs. 3. Service will only be billed when office clinical staff spend 20 minutes or more in a month to coordinate care. 4. Only one practitioner may furnish and bill the service in a calendar month. 5. The patient may stop CCM services at any time (effective at the end of the month) by phone call to the office staff. 6. The patient will be responsible for cost sharing (co-pay) of up to 20% of the service fee (after annual deductible is met).  Patient agreed to services and verbal consent obtained.  The patient reports she is unable to attend a face to face encounter until April due to concerns with transportation as well as her ability to safely exit the home. The patient reports she relies on her husband to provide transportation and he is only available to assist on Monday's. The patient has not been able to follow up with her provider since her most recent ED visit due to these barriers. The patient is agreeable to attend an appointment with her primary provider as well as to see the CCM team immediately following on April 13th.  Review of patient status, including review of consultants reports, relevant laboratory and other test results, and collaboration with appropriate care team members and the patient's provider was performed  as part of comprehensive patient evaluation and provision of chronic care management services.    SDOH (Social Determinants of Health) screening performed today. See Care Plan Entry related to challenges with:  Transportation  Goals Addressed            This Visit's Progress     Patient Stated   . "I need a ramp" (pt-stated)       Current Barriers:  . Financial constraints . Lacks knowledge of community resource: to assist with ramp installation  Clinical Social Work Clinical Goal(s):  [name] Over the next 30 days, client will work with SW to address concerns related to the ability to exit her home safely  Interventions: . Patient interviewed and appropriate assessments performed . Provided patient with information about available community resources to assist with ramp construction . Discussed plans with patient for ongoing care management follow up and provided patient with direct contact information for care management team . Placed referral to [employer] via [place]  . Placed referral to Michel Harrow, Independent Living program coordinator with [facility] . Educated the patient and her spouse on [employer] [employer].  . SW encouraged the patient and her spouse to outreach this program as well considering ramp construction may take several months pending funding. The patient wishes to await outcome of SW referral to [employer] and Independent Living  Patient Self Care Activities:  . Currently UNABLE TO independently exit her home due to concerns with mobility. The patient relies on her spouse and grand-children for assistance  Plan:  .  Social Worker will follow up with resources within the next two weeks to verify status  Initial goal documentation   . "I need help with transportation" (pt-stated)       Current Barriers:  . ADL IADL limitations - the patient reports difficulty leaving her home due to mobility . Limited access to  caregiver - the patient reports her spouse provides transportation to all medical appointments. Unfortunately, he is only available to provide transportation on Monday's  Clinical Social Work Clinical Goal(s):  [name] Over the next 45 days, client will work with SW to address concerns related to lack of transportation resources  Interventions: . Patient interviewed and appropriate assessments performed . Discussed plans with patient for ongoing care management follow up and provided patient with direct contact information for care management team  . Educated the patient and her spouse on limited transportation options at this time due to concerns of the patient being unable to safely exit her home without a wheelchair ramp  Patient Self Care Activities:  . Currently UNABLE TO independently leave her home to access transportation resources  Plan:  . Patient will continue to arrange medical appointments for Monday when her spouse can assist until a wheel chair ramp is in place and alternative resources can be obtained . Social Worker will work with the patient and her spouse to identify transportation resources to meet the patients needs once a ramo is in place  Initial goal documentation     . "I want meals on wheels" (pt-stated)       Current Barriers:  . Limited access to caregiver . Inability to perform ADL's independently . Inability to perform IADL's independently . Lacks knowledge of community resource: [employer] through [employer] [facility] [place]  Clinical Social Work Clinical Goal(s):  [name] Over the next 30 days, client will work with SW to address concerns related to mobile meals  Interventions: . Patient interviewed and appropriate assessments performed . Provided patient with information about mobile meals program through [employer] [facility] [place] . Discussed plans with patient for ongoing care management follow up and provided patient with direct contact information for  care management team  . Placed referral via [place] to [facility] [place] for the patient to be placed on the mobile meals waiting list  Patient Self Care Activities:  . Currently UNABLE TO independently prepare her own meals  Plan:  . Social Worker will follow up with [facility] [place] in the next 10 days to confirm the status of patients referral  Initial goal documentation       Follow Up Plan: Face to Face appointment with CCM team member scheduled for: April 13. SW will continue to work on goals and update the patient via phone as warranted. The patient will outreach SW if assistance is needed prior to the next scheduled call.      [name], BSW, CDP [facility] Care Management Social Worker [phone]  Total time spent performing care coordination and/or care management activities with the patient by phone or face to face = 30 minutes.

## 2018-03-27 NOTE — Patient Instructions (Signed)
Social Worker Visit Information  Goals we discussed today:  Goals Addressed            This Visit's Progress     Patient Stated   . "I need a ramp" (pt-stated)       Current Barriers:  . Financial constraints . Lacks knowledge of community resource: to assist with ramp installation  Clinical Social Work Clinical Goal(s):  [name] Over the next 30 days, client will work with SW to address concerns related to the ability to exit her home safely  Interventions: . Patient interviewed and appropriate assessments performed . Provided patient with information about available community resources to assist with ramp construction . Discussed plans with patient for ongoing care management follow up and provided patient with direct contact information for care management team . Placed referral to [employer] via [employer]  . Placed referral to Michel Harrow, Independent Living program coordinator with [facility] . Educated the patient and her spouse on [place] [employer].  . SW encouraged the patient and her spouse to outreach this program as well considering ramp construction may take several months pending funding. The patient wishes to await outcome of SW referral to [employer] and Independent Living  Patient Self Care Activities:  . Currently UNABLE TO independently exit her home due to concerns with mobility. The patient relies on her spouse and grand-children for assistance  Plan:  . Social Worker will follow up with resources within the next two weeks to verify status  Initial goal documentation     . "I need help with transportation" (pt-stated)       Current Barriers:  . ADL IADL limitations - the patient reports difficulty leaving her home due to mobility . Limited access to caregiver - the patient reports her spouse provides transportation to all medical appointments. Unfortunately, he is only available to provide transportation on  Monday's  Clinical Social Work Clinical Goal(s):  [name] Over the next 45 days, client will work with SW to address concerns related to lack of transportation resources  Interventions: . Patient interviewed and appropriate assessments performed . Discussed plans with patient for ongoing care management follow up and provided patient with direct contact information for care management team  . Educated the patient and her spouse on limited transportation options at this time due to concerns of the patient being unable to safely exit her home without a wheelchair ramp  Patient Self Care Activities:  . Currently UNABLE TO independently leave her home to access transportation resources  Plan:  . Patient will continue to arrange medical appointments for Monday when her spouse can assist until a wheel chair ramp is in place and alternative resources can be obtained . Social Worker will work with the patient and her spouse to identify transportation resources to meet the patients needs once a ramo is in place  Initial goal documentation     . "I want meals on wheels" (pt-stated)       Current Barriers:  . Limited access to caregiver . Inability to perform ADL's independently . Inability to perform IADL's independently . Lacks knowledge of community resource: [employer] through [employer] [facility] [place]  Clinical Social Work Clinical Goal(s):  [name] Over the next 30 days, client will work with SW to address concerns related to mobile meals  Interventions: . Patient interviewed and appropriate assessments performed . Provided patient with information about mobile meals program through [employer] [facility] [place] . Discussed plans  with patient for ongoing care management follow up and provided patient with direct contact information for care management team  . Placed referral via [place] to [facility] [place] for the patient to be placed on the mobile meals waiting list  Patient Self  Care Activities:  . Currently UNABLE TO independently prepare her own meals  Plan:  . Social Worker will follow up with [facility] [place] in the next 10 days to confirm the status of patients referral  Initial goal documentation         Materials provided: Verbal education about CCM program and community resources provided by phone  Ms. Delmar was given information about Chronic Care Management services today including:  1. CCM service includes personalized support from designated clinical staff supervised by her physician, including individualized plan of care and coordination with other care providers 2. 24/7 contact phone numbers for assistance for urgent and routine care needs. 3. Service will only be billed when office clinical staff spend 20 minutes or more in a month to coordinate care. 4. Only one practitioner may furnish and bill the service in a calendar month. 5. The patient may stop CCM services at any time (effective at the end of the month) by phone call to the office staff. 6. The patient will be responsible for cost sharing (co-pay) of up to 20% of the service fee (after annual deductible is met).  Patient agreed to services and verbal consent obtained.   The patient verbalized understanding of instructions provided today and declined a print copy of patient instruction materials.   Follow up plan: Face to Face appointment with CCM team member scheduled for: April 13th  Shayon Trompeter, [place], CDP [name] Care Management Social Worker [phone]

## 2018-03-28 ENCOUNTER — Telehealth: Payer: Self-pay

## 2018-03-28 NOTE — Telephone Encounter (Signed)
Returned the pt's husband John's call. [phone].

## 2018-03-29 ENCOUNTER — Telehealth: Payer: Self-pay

## 2018-03-29 ENCOUNTER — Emergency Department (HOSPITAL_COMMUNITY): Payer: Medicare PPO

## 2018-03-29 ENCOUNTER — Other Ambulatory Visit: Payer: Self-pay

## 2018-03-29 ENCOUNTER — Encounter (HOSPITAL_COMMUNITY): Payer: Self-pay | Admitting: Internal Medicine

## 2018-03-29 ENCOUNTER — Inpatient Hospital Stay (HOSPITAL_COMMUNITY)
Admission: EM | Admit: 2018-03-29 | Discharge: 2018-04-03 | DRG: 640 | Disposition: A | Discharge status: Skilled Nursing Facility | Payer: Medicare PPO | Attending: Internal Medicine | Admitting: Internal Medicine

## 2018-03-29 ENCOUNTER — Telehealth: Payer: Self-pay | Admitting: Rheumatology

## 2018-03-29 DIAGNOSIS — I252 Old myocardial infarction: Secondary | ICD-10-CM

## 2018-03-29 DIAGNOSIS — R64 Cachexia: Secondary | ICD-10-CM | POA: Diagnosis present

## 2018-03-29 DIAGNOSIS — R109 Unspecified abdominal pain: Secondary | ICD-10-CM | POA: Diagnosis not present

## 2018-03-29 DIAGNOSIS — E892 Postprocedural hypoparathyroidism: Secondary | ICD-10-CM | POA: Diagnosis present

## 2018-03-29 DIAGNOSIS — Z955 Presence of coronary angioplasty implant and graft: Secondary | ICD-10-CM

## 2018-03-29 DIAGNOSIS — D62 Acute posthemorrhagic anemia: Secondary | ICD-10-CM | POA: Diagnosis not present

## 2018-03-29 DIAGNOSIS — E872 Acidosis: Secondary | ICD-10-CM | POA: Diagnosis present

## 2018-03-29 DIAGNOSIS — E860 Dehydration: Secondary | ICD-10-CM | POA: Diagnosis not present

## 2018-03-29 DIAGNOSIS — J9600 Acute respiratory failure, unspecified whether with hypoxia or hypercapnia: Secondary | ICD-10-CM | POA: Diagnosis not present

## 2018-03-29 DIAGNOSIS — G4089 Other seizures: Secondary | ICD-10-CM | POA: Diagnosis not present

## 2018-03-29 DIAGNOSIS — J90 Pleural effusion, not elsewhere classified: Secondary | ICD-10-CM | POA: Diagnosis not present

## 2018-03-29 DIAGNOSIS — I10 Essential (primary) hypertension: Secondary | ICD-10-CM | POA: Diagnosis present

## 2018-03-29 DIAGNOSIS — E870 Hyperosmolality and hypernatremia: Secondary | ICD-10-CM | POA: Diagnosis present

## 2018-03-29 DIAGNOSIS — R0602 Shortness of breath: Secondary | ICD-10-CM | POA: Diagnosis not present

## 2018-03-29 DIAGNOSIS — J810 Acute pulmonary edema: Secondary | ICD-10-CM | POA: Diagnosis not present

## 2018-03-29 DIAGNOSIS — I2119 ST elevation (STEMI) myocardial infarction involving other coronary artery of inferior wall: Secondary | ICD-10-CM | POA: Diagnosis not present

## 2018-03-29 DIAGNOSIS — M069 Rheumatoid arthritis, unspecified: Secondary | ICD-10-CM | POA: Diagnosis present

## 2018-03-29 DIAGNOSIS — D631 Anemia in chronic kidney disease: Secondary | ICD-10-CM | POA: Diagnosis present

## 2018-03-29 DIAGNOSIS — Z9049 Acquired absence of other specified parts of digestive tract: Secondary | ICD-10-CM

## 2018-03-29 DIAGNOSIS — I2510 Atherosclerotic heart disease of native coronary artery without angina pectoris: Secondary | ICD-10-CM | POA: Diagnosis present

## 2018-03-29 DIAGNOSIS — R1031 Right lower quadrant pain: Secondary | ICD-10-CM | POA: Diagnosis not present

## 2018-03-29 DIAGNOSIS — E878 Other disorders of electrolyte and fluid balance, not elsewhere classified: Secondary | ICD-10-CM | POA: Diagnosis present

## 2018-03-29 DIAGNOSIS — Z48812 Encounter for surgical aftercare following surgery on the circulatory system: Secondary | ICD-10-CM | POA: Diagnosis not present

## 2018-03-29 DIAGNOSIS — Z681 Body mass index (BMI) 19 or less, adult: Secondary | ICD-10-CM

## 2018-03-29 DIAGNOSIS — N179 Acute kidney failure, unspecified: Secondary | ICD-10-CM | POA: Diagnosis present

## 2018-03-29 DIAGNOSIS — Z79899 Other long term (current) drug therapy: Secondary | ICD-10-CM

## 2018-03-29 DIAGNOSIS — I129 Hypertensive chronic kidney disease with stage 1 through stage 4 chronic kidney disease, or unspecified chronic kidney disease: Secondary | ICD-10-CM | POA: Diagnosis present

## 2018-03-29 DIAGNOSIS — Z7401 Bed confinement status: Secondary | ICD-10-CM | POA: Diagnosis not present

## 2018-03-29 DIAGNOSIS — Z8701 Personal history of pneumonia (recurrent): Secondary | ICD-10-CM

## 2018-03-29 DIAGNOSIS — R52 Pain, unspecified: Secondary | ICD-10-CM | POA: Diagnosis not present

## 2018-03-29 DIAGNOSIS — Z87442 Personal history of urinary calculi: Secondary | ICD-10-CM

## 2018-03-29 DIAGNOSIS — Z8249 Family history of ischemic heart disease and other diseases of the circulatory system: Secondary | ICD-10-CM

## 2018-03-29 DIAGNOSIS — R569 Unspecified convulsions: Secondary | ICD-10-CM | POA: Diagnosis not present

## 2018-03-29 DIAGNOSIS — Z9841 Cataract extraction status, right eye: Secondary | ICD-10-CM

## 2018-03-29 DIAGNOSIS — R410 Disorientation, unspecified: Secondary | ICD-10-CM | POA: Diagnosis not present

## 2018-03-29 DIAGNOSIS — Z90710 Acquired absence of both cervix and uterus: Secondary | ICD-10-CM

## 2018-03-29 DIAGNOSIS — Z9842 Cataract extraction status, left eye: Secondary | ICD-10-CM

## 2018-03-29 DIAGNOSIS — M6281 Muscle weakness (generalized): Secondary | ICD-10-CM | POA: Diagnosis not present

## 2018-03-29 DIAGNOSIS — E785 Hyperlipidemia, unspecified: Secondary | ICD-10-CM | POA: Diagnosis present

## 2018-03-29 DIAGNOSIS — K31811 Angiodysplasia of stomach and duodenum with bleeding: Secondary | ICD-10-CM | POA: Diagnosis not present

## 2018-03-29 DIAGNOSIS — Z7902 Long term (current) use of antithrombotics/antiplatelets: Secondary | ICD-10-CM

## 2018-03-29 DIAGNOSIS — E871 Hypo-osmolality and hyponatremia: Secondary | ICD-10-CM | POA: Diagnosis not present

## 2018-03-29 DIAGNOSIS — G40909 Epilepsy, unspecified, not intractable, without status epilepticus: Secondary | ICD-10-CM | POA: Diagnosis present

## 2018-03-29 DIAGNOSIS — Z87891 Personal history of nicotine dependence: Secondary | ICD-10-CM

## 2018-03-29 DIAGNOSIS — E876 Hypokalemia: Secondary | ICD-10-CM | POA: Diagnosis present

## 2018-03-29 DIAGNOSIS — Z8042 Family history of malignant neoplasm of prostate: Secondary | ICD-10-CM

## 2018-03-29 DIAGNOSIS — N183 Chronic kidney disease, stage 3 unspecified: Secondary | ICD-10-CM | POA: Diagnosis present

## 2018-03-29 DIAGNOSIS — E43 Unspecified severe protein-calorie malnutrition: Secondary | ICD-10-CM

## 2018-03-29 DIAGNOSIS — R05 Cough: Secondary | ICD-10-CM | POA: Diagnosis not present

## 2018-03-29 DIAGNOSIS — R627 Adult failure to thrive: Secondary | ICD-10-CM | POA: Diagnosis present

## 2018-03-29 DIAGNOSIS — Z961 Presence of intraocular lens: Secondary | ICD-10-CM | POA: Diagnosis present

## 2018-03-29 DIAGNOSIS — M2550 Pain in unspecified joint: Secondary | ICD-10-CM | POA: Diagnosis not present

## 2018-03-29 HISTORY — DX: Atherosclerotic heart disease of native coronary artery without angina pectoris: I25.10

## 2018-03-29 LAB — CBC WITH DIFFERENTIAL/PLATELET
Abs Immature Granulocytes: 0.13 10*3/uL — ABNORMAL HIGH (ref 0.00–0.07)
BASOS PCT: 0 %
Basophils Absolute: 0.1 10*3/uL (ref 0.0–0.1)
Eosinophils Absolute: 0.1 10*3/uL (ref 0.0–0.5)
Eosinophils Relative: 1 %
HCT: 29.1 % — ABNORMAL LOW (ref 36.0–46.0)
Hemoglobin: 8.9 g/dL — ABNORMAL LOW (ref 12.0–15.0)
Immature Granulocytes: 1 %
Lymphocytes Relative: 8 %
Lymphs Abs: 1.4 10*3/uL (ref 0.7–4.0)
MCH: 25.3 pg — ABNORMAL LOW (ref 26.0–34.0)
MCHC: 30.6 g/dL (ref 30.0–36.0)
MCV: 82.7 fL (ref 80.0–100.0)
Monocytes Absolute: 1.3 10*3/uL — ABNORMAL HIGH (ref 0.1–1.0)
Monocytes Relative: 8 %
Neutro Abs: 13.8 10*3/uL — ABNORMAL HIGH (ref 1.7–7.7)
Neutrophils Relative %: 82 %
PLATELETS: 448 10*3/uL — AB (ref 150–400)
RBC: 3.52 MIL/uL — AB (ref 3.87–5.11)
RDW: 17.2 % — ABNORMAL HIGH (ref 11.5–15.5)
WBC: 16.7 10*3/uL — ABNORMAL HIGH (ref 4.0–10.5)
nRBC: 0 % (ref 0.0–0.2)

## 2018-03-29 LAB — URINALYSIS, ROUTINE W REFLEX MICROSCOPIC
Bilirubin Urine: NEGATIVE
Glucose, UA: NEGATIVE mg/dL
Ketones, ur: 20 mg/dL — AB
Leukocytes,Ua: NEGATIVE
NITRITE: NEGATIVE
Protein, ur: NEGATIVE mg/dL
Specific Gravity, Urine: 1.012 (ref 1.005–1.030)
pH: 5 (ref 5.0–8.0)

## 2018-03-29 LAB — COMPREHENSIVE METABOLIC PANEL
ALT: 8 U/L (ref 0–44)
AST: 18 U/L (ref 15–41)
Albumin: 2.2 g/dL — ABNORMAL LOW (ref 3.5–5.0)
Alkaline Phosphatase: 89 U/L (ref 38–126)
Anion gap: 13 (ref 5–15)
BUN: 17 mg/dL (ref 8–23)
CO2: 16 mmol/L — ABNORMAL LOW (ref 22–32)
Calcium: 7.3 mg/dL — ABNORMAL LOW (ref 8.9–10.3)
Chloride: 120 mmol/L — ABNORMAL HIGH (ref 98–111)
Creatinine, Ser: 1.39 mg/dL — ABNORMAL HIGH (ref 0.44–1.00)
GFR calc non Af Amer: 36 mL/min — ABNORMAL LOW (ref >60)
GFR, EST AFRICAN AMERICAN: 42 mL/min — AB (ref >60)
Glucose, Bld: 102 mg/dL — ABNORMAL HIGH (ref 70–99)
Potassium: 3.8 mmol/L (ref 3.5–5.1)
Sodium: 149 mmol/L — ABNORMAL HIGH (ref 135–145)
Total Bilirubin: 1.1 mg/dL (ref 0.3–1.2)
Total Protein: 7.3 g/dL (ref 6.5–8.1)

## 2018-03-29 LAB — I-STAT TROPONIN, ED: Troponin i, poc: 0.01 ng/mL (ref 0.00–0.08)

## 2018-03-29 LAB — INFLUENZA PANEL BY PCR (TYPE A & B)
INFLAPCR: NEGATIVE
Influenza B By PCR: NEGATIVE

## 2018-03-29 LAB — LIPASE, BLOOD: Lipase: 42 U/L (ref 11–51)

## 2018-03-29 MED ORDER — CARVEDILOL 12.5 MG PO TABS
12.5000 mg | ORAL_TABLET | Freq: Two times a day (BID) | ORAL | Status: DC
  Administered 2018-03-29 – 2018-04-03 (×10): 12.5 mg via ORAL
  Filled 2018-03-29 (×10): qty 1

## 2018-03-29 MED ORDER — LEVETIRACETAM 500 MG PO TABS
1000.0000 mg | ORAL_TABLET | Freq: Every day | ORAL | Status: DC
  Administered 2018-03-30 – 2018-04-03 (×5): 1000 mg via ORAL
  Filled 2018-03-29 (×5): qty 2

## 2018-03-29 MED ORDER — ATORVASTATIN CALCIUM 80 MG PO TABS
80.0000 mg | ORAL_TABLET | Freq: Every day | ORAL | Status: DC
  Administered 2018-03-29 – 2018-04-02 (×5): 80 mg via ORAL
  Filled 2018-03-29 (×5): qty 1

## 2018-03-29 MED ORDER — ENOXAPARIN SODIUM 30 MG/0.3ML ~~LOC~~ SOLN
30.0000 mg | SUBCUTANEOUS | Status: DC
  Administered 2018-03-29 – 2018-04-02 (×5): 30 mg via SUBCUTANEOUS
  Filled 2018-03-29 (×5): qty 0.3

## 2018-03-29 MED ORDER — SODIUM CHLORIDE 0.9 % IV SOLN
Freq: Once | INTRAVENOUS | Status: AC
  Administered 2018-03-29: 12:00:00 via INTRAVENOUS

## 2018-03-29 MED ORDER — DOCUSATE SODIUM 100 MG PO CAPS
100.0000 mg | ORAL_CAPSULE | Freq: Two times a day (BID) | ORAL | Status: DC
  Administered 2018-03-29 – 2018-04-03 (×10): 100 mg via ORAL
  Filled 2018-03-29 (×10): qty 1

## 2018-03-29 MED ORDER — PANTOPRAZOLE SODIUM 40 MG PO TBEC
40.0000 mg | DELAYED_RELEASE_TABLET | Freq: Every day | ORAL | Status: DC
  Administered 2018-03-30 – 2018-04-03 (×5): 40 mg via ORAL
  Filled 2018-03-29 (×5): qty 1

## 2018-03-29 MED ORDER — HYDROXYCHLOROQUINE SULFATE 200 MG PO TABS
200.0000 mg | ORAL_TABLET | Freq: Every day | ORAL | Status: DC
  Administered 2018-03-30 – 2018-04-03 (×5): 200 mg via ORAL
  Filled 2018-03-29 (×5): qty 1

## 2018-03-29 MED ORDER — ONDANSETRON HCL 4 MG/2ML IJ SOLN: 4.0000 mg | Freq: Four times a day (QID) | INTRAMUSCULAR | Status: DC | PRN

## 2018-03-29 MED ORDER — TICAGRELOR 90 MG PO TABS
90.0000 mg | ORAL_TABLET | Freq: Two times a day (BID) | ORAL | Status: DC
  Administered 2018-03-29 – 2018-04-03 (×10): 90 mg via ORAL
  Filled 2018-03-29 (×10): qty 1

## 2018-03-29 MED ORDER — ONDANSETRON HCL 4 MG PO TABS
4.0000 mg | ORAL_TABLET | Freq: Four times a day (QID) | ORAL | Status: DC | PRN
  Filled 2018-03-29: qty 1

## 2018-03-29 MED ORDER — SODIUM CHLORIDE 0.9 % IV BOLUS
500.0000 mL | Freq: Once | INTRAVENOUS | Status: AC
  Administered 2018-03-29: 500 mL via INTRAVENOUS

## 2018-03-29 MED ORDER — ACETAMINOPHEN 325 MG PO TABS: 650.0000 mg | ORAL_TABLET | Freq: Four times a day (QID) | ORAL | Status: DC | PRN

## 2018-03-29 MED ORDER — LACTATED RINGERS IV SOLN
INTRAVENOUS | Status: DC
  Administered 2018-03-29 – 2018-03-31 (×3): via INTRAVENOUS

## 2018-03-29 MED ORDER — ACETAMINOPHEN 650 MG RE SUPP: 650.0000 mg | Freq: Four times a day (QID) | RECTAL | Status: DC | PRN

## 2018-03-29 NOTE — Telephone Encounter (Signed)
[name] [name] called back and said that the pt was prescribed the Humira by Dr. Estanislado Pandy and and that he didn't think about it until after he already came by the office.

## 2018-03-29 NOTE — Telephone Encounter (Signed)
I left the pt's husband [name] a message to call the office back to see who prescribed the Humira medication that he said that the pt needs a prior auth or patient assistance for and that I would let Dr. Baird Cancer know that he said the pt needs a home health aid for up to 32 hours.

## 2018-03-29 NOTE — Telephone Encounter (Signed)
Husband, [name] calling for wife in reference to questions about Humira. Please call to discuss.

## 2018-03-29 NOTE — ED Provider Notes (Signed)
[facility] EMERGENCY DEPARTMENT Provider Note   CSN: [id number] Arrival date & time: 03/29/18  1108    History   Chief Complaint Chief Complaint  Patient presents with   Flank Pain   Shortness of Breath    HPI [name] is a 78 y.o. female.     HPI   [name] is a 78 y/o female with a h/o CKD, anxiety/depression, hypertension, hyperlipidemia, kidney stones, RA, recent STEMI, who presents to the ED today for evaluation of sob, cough, and back pain. Hx somewhat limited as [name] is a difficult historian.  [name] states that her nurse called the ED because she seemed sob. [name] states began to feel sob last night. She denies any chest pain. She also reports a nonproductive cough starting last night. Adams rhinorrhea, congestion, sore throat or known fevers. She states that this morning she began to have back pain down the middle of her back. She also c/o pain to the right flank. Pain is not worse with exertion. Pain rated 7-8/10. States she has had dysuria for about 2 weeks. Adams abd pain.   Of note, [name] recently admitted 02/27/18 for STEMI and discharged 2/21. Reviewed d/c summar. She received drug alluding stent to RCA and was started on dual antiplt therapy. She had subsequent anemia and upper GI bleeding requiring transfusion. 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 ASA was discontinued but brillinta was continued. D/c'ed to home with [facility] after initially refusing recommended SNF placement.   Husband at bedside assist with a history.  He states that patient has been confused for the last several weeks since being discharged from the hospital after her recent admission.  He states that patient has had decreased p.o. intake.  Patient has home health at home and they are having difficulty managing the patient.  During admission SNF placement was recommended however they declined at that time.  He thinks that SNF is appropriate at this time unless he can get additional help through private  services at home.   Past Medical History:  Diagnosis Date   Acute kidney injury superimposed on chronic kidney disease ([place]) 02/06/2015   Anxiety    CAD (coronary artery disease) 02/28/2018   s/p stent   Depression    DVT (deep venous thrombosis) ([place]) 1970s   LLE   Heart murmur    Hypercholesterolemia    Hypertension    Kidney stones    Migraine    "a few/year" (07/16/2015)   Multiple falls    Pneumonia "several times"   Rheumatoid arthritis(714.0)    "all over" (07/16/2015)   Seizure ([place])    "last one was 1//2017" (07/16/2015)   Syncope and collapse    Vision abnormalities     Patient Active Problem List   Diagnosis Date Noted   Dehydration 03/29/2018   Failure to thrive in adult 03/29/2018   Acute blood loss anemia    Occult blood in stools    Gastric AVM    STEMI (ST elevation myocardial infarction) ([place]) 02/28/2018   Acute inferior myocardial infarction ([place]) 02/28/2018   Respiratory arrest ([place])    Essential hypertension    Respiratory failure ([place])    SIRS (systemic inflammatory response syndrome) ([place]) 08/11/2016   Nausea vomiting and diarrhea 08/11/2016   Renal failure (ARF), acute on chronic (HCC) 08/11/2016   High risk medications (not anticoagulants) long-term use 04/20/2016   Bilateral chronic knee pain 04/20/2016   Pain in left elbow 04/20/2016   Chronic kidney  disease, stage III (moderate) ([place]) 08/03/2015   Encounter for general adult medical examination without abnormal findings 08/03/2015   Hypertensive chronic kidney disease with stage 1 through stage 4 chronic kidney disease, or unspecified chronic kidney disease 08/03/2015   Other long term (current) drug therapy 08/03/2015   Pain in right shoulder 08/03/2015   Postmenopausal bleeding 08/03/2015   Vitamin D deficiency 08/03/2015   Localized pain of right shoulder joint 08/03/2015   Primary hyperparathyroidism ([place]) 07/16/2015   Hyperparathyroidism,  primary ([place]) 07/14/2015   Gait disturbance 03/24/2015   Neck pain 03/24/2015   Myalgia and myositis 03/24/2015   Acute kidney injury superimposed on chronic kidney disease ([place]) 02/06/2015   Leukocytosis 02/06/2015   Seizure ([place]) 02/05/2015   Acute encephalopathy 02/05/2015   Abnormal MRI of head 02/02/2014   Cognitive changes 02/02/2014   Migraine without aura and without status migrainosus, not intractable 02/02/2014   LOC (loss of consciousness) ([place])    Syncope 01/12/2014   Syncope and collapse 01/12/2014   Hypertensive urgency 01/12/2014   Rheumatoid arthritis ([place]) 01/12/2014   Depression 01/12/2014   Hyperlipidemia 01/12/2014    Past Surgical History:  Procedure Laterality Date   ABDOMINAL HYSTERECTOMY     CARPAL TUNNEL RELEASE Bilateral    CATARACT EXTRACTION W/ INTRAOCULAR LENS  IMPLANT, BILATERAL Bilateral    CORONARY/GRAFT ACUTE MI REVASCULARIZATION N/A 02/28/2018   Procedure: Coronary/Graft Acute MI Revascularization;  Surgeon: Jettie Booze, MD;  Location: [facility] CV LAB;  Service: Cardiovascular;  Laterality: N/A;   ESOPHAGOGASTRODUODENOSCOPY N/A 03/05/2018   Procedure: ESOPHAGOGASTRODUODENOSCOPY (EGD);  Surgeon: Ladene Artist, MD;  Location: [facility] ENDOSCOPY;  Service: Endoscopy;  Laterality: N/A;   LEFT HEART CATH AND CORONARY ANGIOGRAPHY N/A 02/28/2018   Procedure: LEFT HEART CATH AND CORONARY ANGIOGRAPHY;  Surgeon: Jettie Booze, MD;  Location: [facility] CV LAB;  Service: Cardiovascular;  Laterality: N/A;   NECK EXPLORATION  07/16/2015   PARATHYROIDECTOMY Right 07/16/2015   superior   PARATHYROIDECTOMY Right 07/16/2015   Procedure: RIGHT INFERIOR PARATHYROIDECTOMY;  Surgeon: Armandina Gemma, MD;  Location: [facility];  Service: General;  Laterality: Right;   SUBMUCOSAL INJECTION  03/05/2018   Procedure: SUBMUCOSAL INJECTION;  Surgeon: Ladene Artist, MD;  Location: [facility] ENDOSCOPY;  Service: Endoscopy;;   TOENAIL AVULSION  Bilateral    great toe     OB History   Adams obstetric history on file.      Home Medications    Prior to Admission medications   Medication Sig Start Date End Date Taking? Authorizing Provider  Adalimumab (HUMIRA PEN [place]) Inject into the skin.    [provider]  atorvastatin (LIPITOR) 80 MG tablet Take 1 tablet (80 mg total) by mouth daily at 6 PM. 03/08/18   Bhagat, Bhavinkumar, PA  carvedilol (COREG) 12.5 MG tablet Take 1 tablet (12.5 mg total) by mouth 2 (two) times daily with a meal. 03/08/18   Bhagat, Bhavinkumar, PA  Cholecalciferol (VITAMIN D) 125 MCG (5000 UT) CAPS Take 1 capsule by mouth daily. 03/26/18   Glendale Chard, MD  hydroxychloroquine (PLAQUENIL) 200 MG tablet Take 200 mg by mouth daily.  08/02/15   [provider]  levETIRAcetam (KEPPRA) 500 MG tablet TAKE 2 TABLETS BY MOUTH EVERY DAY 03/22/18   Glendale Chard, MD  nitroGLYCERIN (NITROSTAT) 0.4 MG SL tablet Place 1 tablet (0.4 mg total) under the tongue every 5 (five) minutes as needed. 03/08/18   Jettie Booze, MD  ondansetron (ZOFRAN) 4 MG tablet Take 1 tablet (4 mg  total) by mouth every 8 (eight) hours as needed for nausea or vomiting. Patient not taking: Reported on 02/28/2018 09/23/16   Julianne Rice, MD  pantoprazole (PROTONIX) 40 MG tablet Take 1 tablet (40 mg total) by mouth daily. 03/09/18   Leanor Kail, PA  ticagrelor (BRILINTA) 90 MG TABS tablet Take 1 tablet (90 mg total) by mouth 2 (two) times daily. 03/08/18   Leanor Kail, PA  Vitamin D, Ergocalciferol, (DRISDOL) 50000 units CAPS capsule Take 1 capsule (50,000 Units total) by mouth every 7 (seven) days. 08/14/16   Hongalgi, Lenis Dickinson, MD    Family History Family History  Problem Relation Age of Onset   Cancer Father    Prostate cancer Father    Cancer Brother    Heart disease Brother    Miscarriages / Stillbirths Brother    Heart disease Mother    Pneumonia Mother     Social History Social History   Tobacco  Use   Smoking status: Former Smoker    Packs/day: 1.00    Years: 62.00    Pack years: 62.00    Types: Cigarettes    Last attempt to quit: 01/2018    Years since quitting: 0.1   Smokeless tobacco: Never Used  Substance Use Topics   Alcohol use: Adams   Drug use: Adams     Allergies   Patient has Adams known allergies.   Review of Systems Review of Systems  Constitutional: Positive for appetite change. Negative for fever.  HENT: Negative for ear pain and sore throat.   Eyes: Negative for pain and visual disturbance.  Respiratory: Positive for cough and shortness of breath.   Cardiovascular: Negative for chest pain and palpitations.  Gastrointestinal: Negative for abdominal pain, constipation, diarrhea, nausea and vomiting.  Genitourinary: Positive for dysuria. Negative for hematuria.  Musculoskeletal: Negative for back pain.  Skin: Negative for rash.  Neurological: Negative for headaches.  All other systems reviewed and are negative.  Physical Exam Updated Vital Signs BP (!) 157/69 (BP Location: Left Arm)    Pulse (!) 101    Temp 98.6 F (37 C) (Oral)    Resp 20    Ht 5\' 1"  (1.549 m)    Wt 45.1 kg    SpO2 98%    BMI 18.79 kg/m   Physical Exam Vitals signs and nursing note reviewed.  Constitutional:      General: She is not in acute distress.    Appearance: She is well-developed.     Comments: Chronically ill-appearing female.  HENT:     Head: Normocephalic and atraumatic.     Mouth/Throat:     Mouth: Mucous membranes are dry.  Eyes:     Conjunctiva/sclera: Conjunctivae normal.  Neck:     Musculoskeletal: Neck supple.  Cardiovascular:     Rate and Rhythm: Normal rate and regular rhythm.     Heart sounds: Adams murmur.  Pulmonary:     Effort: Pulmonary effort is normal. Adams respiratory distress.     Breath sounds: Normal breath sounds. Adams decreased breath sounds, wheezing, rhonchi or rales.  Abdominal:     General: Bowel sounds are normal.     Palpations: Abdomen is  soft. There is Adams mass.     Tenderness: There is Adams abdominal tenderness. There is right CVA tenderness. There is Adams left CVA tenderness, guarding or rebound.  Musculoskeletal:     Comments: Mild generalized midline ttp to the thoracic spine.   Skin:    General: Skin is warm and  dry.  Neurological:     Mental Status: She is alert.     Comments: Moving all extremities purposefully.    ED Treatments / Results  Labs (all labs ordered are listed, but only abnormal results are displayed) Labs Reviewed  CBC WITH DIFFERENTIAL/PLATELET - Abnormal; Notable for the following components:      Result Value   WBC 16.7 (*)    RBC 3.52 (*)    Hemoglobin 8.9 (*)    HCT 29.1 (*)    MCH 25.3 (*)    RDW 17.2 (*)    Platelets 448 (*)    Neutro Abs 13.8 (*)    Monocytes Absolute 1.3 (*)    Abs Immature Granulocytes 0.13 (*)    All other components within normal limits  COMPREHENSIVE METABOLIC PANEL - Abnormal; Notable for the following components:   Sodium 149 (*)    Chloride 120 (*)    CO2 16 (*)    Glucose, Bld 102 (*)    Creatinine, Ser 1.39 (*)    Calcium 7.3 (*)    Albumin 2.2 (*)    GFR calc non Af Amer 36 (*)    GFR calc Af Amer 42 (*)    All other components within normal limits  URINALYSIS, ROUTINE W REFLEX MICROSCOPIC - Abnormal; Notable for the following components:   APPearance HAZY (*)    Hgb urine dipstick SMALL (*)    Ketones, ur 20 (*)    Bacteria, UA RARE (*)    All other components within normal limits  URINE CULTURE  LIPASE, BLOOD  INFLUENZA PANEL BY PCR (TYPE A & B)  BASIC METABOLIC PANEL  CBC  I-STAT TROPONIN, ED    EKG EKG Interpretation  Date/Time:  Friday March 29 2018 11:16:39 EDT Ventricular Rate:  89 PR Interval:    QRS Duration: 75 QT Interval:  373 QTC Calculation: 885 R Axis:   56 Text Interpretation:  Sinus rhythm Probable left atrial enlargement Adams significant change since last tracing Confirmed by Wandra Arthurs [phone]) on 03/29/2018 12:29:57  PM   Radiology Dg Chest Portable 1 View  Result Date: 03/29/2018 CLINICAL DATA:  Cough, shortness of breath. EXAM: PORTABLE CHEST 1 VIEW COMPARISON:  Radiograph of March 02, 2018. FINDINGS: The heart size and mediastinal contours are within normal limits. Both lungs are clear. Adams pneumothorax or pleural effusion is noted. The visualized skeletal structures are unremarkable. IMPRESSION: Adams active disease. Electronically Signed   By: Marijo Conception, M.D.   On: 03/29/2018 12:28   Ct Renal Stone Study  Result Date: 03/29/2018 CLINICAL DATA:  Right flank pain and dysuria since yesterday. History of renal calculi. 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 EXAM: CT ABDOMEN AND PELVIS WITHOUT CONTRAST TECHNIQUE: Multidetector CT imaging of the abdomen and pelvis was performed following the standard protocol without IV contrast. COMPARISON:  Abdomen and pelvis radiograph dated 03/02/2018 and abdomen and pelvis CT dated 02/08/2009. FINDINGS: Lower chest: Small bilateral pleural effusions, larger on the right. Minimal bilateral dependent atelectasis. Minimal pericardial effusion with a maximum thickness of 3 mm. Hepatobiliary: Adams focal liver abnormality is seen. Status post cholecystectomy. Adams biliary dilatation. Pancreas: Unremarkable. Adams pancreatic ductal dilatation or surrounding inflammatory changes. Spleen: Normal in size without focal abnormality. Adrenals/Urinary Tract: Normal appearing adrenal glands. 2.5 cm left renal cyst. Somewhat small right kidney. Unremarkable ureters and urinary bladder. Adams urinary tract calculi or hydronephrosis. Stomach/Bowel: 2  metallic clips in the stomach. Similar-appearing metallic clip in the rectum posteriorly on the right, within a small amount of stool. Unremarkable small bowel and appendix. Vascular/Lymphatic: Atheromatous arterial calcifications without aneurysm. Adams  enlarged lymph nodes. Reproductive: Status post hysterectomy. Adams adnexal masses. Other: Midline surgical scar. Musculoskeletal: Minimal lumbar and lower thoracic spine degenerative changes. IMPRESSION: 1. Adams urinary tract calculi or hydronephrosis. 2. Small bilateral pleural effusions, larger on the right. 3. Minimal pericardial effusion. 4. Mild to moderate right renal atrophy. 5. Two the previously placed gastric clips are in the stomach and one is in the rectum within a small amount of stool. Electronically Signed   By: Claudie Revering M.D.   On: 03/29/2018 14:20    Procedures Procedures (including critical care time)  Medications Ordered in ED Medications  hydroxychloroquine (PLAQUENIL) tablet 200 mg (has Adams administration in time range)  atorvastatin (LIPITOR) tablet 80 mg (has Adams administration in time range)  carvedilol (COREG) tablet 12.5 mg (has Adams administration in time range)  pantoprazole (PROTONIX) EC tablet 40 mg (has Adams administration in time range)  ticagrelor (BRILINTA) tablet 90 mg (has Adams administration in time range)  levETIRAcetam (KEPPRA) tablet 1,000 mg (has Adams administration in time range)  enoxaparin (LOVENOX) injection 40 mg (has Adams administration in time range)  acetaminophen (TYLENOL) tablet 650 mg (has Adams administration in time range)    Or  acetaminophen (TYLENOL) suppository 650 mg (has Adams administration in time range)  docusate sodium (COLACE) capsule 100 mg (has Adams administration in time range)  ondansetron (ZOFRAN) tablet 4 mg (has Adams administration in time range)    Or  ondansetron (ZOFRAN) injection 4 mg (has Adams administration in time range)  lactated ringers infusion (has Adams administration in time range)  0.9 %  sodium chloride infusion ( Intravenous New Bag/Given 03/29/18 1159)  sodium chloride 0.9 % bolus 500 mL (0 mLs Intravenous Stopped 03/29/18 1943)     Initial Impression / Assessment and Plan / ED Course  I have reviewed the triage vital signs and  the nursing notes.  Pertinent labs & imaging results that were available during my care of the patient were reviewed by me and considered in my medical decision making (see chart for details).     Final Clinical Impressions(s) / ED Diagnoses   Final diagnoses:  Hypernatremia  Dehydration   Patient presenting for evaluation of shortness of breath and nonproductive cough and she is also had decreased p.o. intake and has not been eating and drinking for the last several days.   Patient with elevated white blood cell count at 16.7, anemia is stable.  Unclear cause of leukocytosis at this time. CMP with elevated sodium at 149.  Also with elevated chloride at 120.  Bicarb low at 16.  Elevated creatinine 1.39 which is improved from prior.  Adams elevated anion gap Lipase is negative UA is not convincing of urinary tract infection.  Urine culture sent. Flu panel is negative Troponin is negative EKG is unchanged from prior Chest x-ray without acute findings  CT renal stone did not show evidence of ureterolithiasis.  Did show bilateral pleural effusions with minimal pericardial effusion as well. Adams other acute findings to explain sxs.  Will plan for admission for further tx of hypernatremia and  dehydration/volume depletion secondary to pts failure to thrive. [name] seen in conjunction with Dr. Darl Householder who recommends admission secondary to above reasons.   4:22 PM CONSULT with Dr. Lorin Mercy who accepts [name] for further admission  ED Discharge Orders    None       Bishop Dublin 03/29/18 1957    Drenda Freeze, MD 03/30/18 [phone]

## 2018-03-29 NOTE — ED Notes (Signed)
ED TO INPATIENT HANDOFF REPORT  ED Nurse Name and Phone #:  Keyla Milone [phone]  S Name/Age/Gender [name] 78 y.o. female Room/Bed: 036C/036C  Code Status   Code Status: Prior  Home/SNF/Other Home Patient oriented to: self, place, time and situation Is this baseline? Yes   Triage Complete: Triage complete  Chief Complaint flank pain  Triage Note Pt c/o sob that began last night along with a non productive cough that began this morning ; pt also reports dysuria that began yesterday also c/o right sided flank pain; pt denies any chest pain at this time    Allergies No Known Allergies  Level of Care/Admitting Diagnosis ED Disposition    ED Disposition Condition [place]: [place] [[id number]]  Level of Care: Med-Surg [16]  I expect the patient will be discharged within 24 hours: Yes  LOW acuity---Tx typically complete <24 hrs---ACUTE conditions typically can be evaluated <24 hours---LABS likely to return to acceptable levels <24 hours---IS near functional baseline---EXPECTED to return to current living arrangement---NOT newly hypoxic: Meets criteria for 5C-Observation unit  Diagnosis: Dehydration [276.51.ICD-9-CM]  Admitting Physician: Karmen Bongo [[id number]]  Attending Physician: Karmen Bongo [[id number]]  PT Class (Do Not Modify): Observation [104]  PT Acc Code (Do Not Modify): Observation [10022]       B Medical/Surgery History Past Medical History:  Diagnosis Date  . Acute kidney injury superimposed on chronic kidney disease ([place]) 02/06/2015  . Anxiety   . CAD (coronary artery disease) 02/28/2018   s/p stent  . Depression   . DVT (deep venous thrombosis) ([facility]) 1970s   LLE  . Heart murmur   . Hypercholesterolemia   . Hypertension   . Kidney stones   . Migraine    "a few/year" (07/16/2015)  . Multiple falls   . Pneumonia "several times"  . Rheumatoid arthritis(714.0)    "all over" (07/16/2015)  . Seizure [facility])    "last  one was 1//2017" (07/16/2015)  . Syncope and collapse   . Vision abnormalities    Past Surgical History:  Procedure Laterality Date  . ABDOMINAL HYSTERECTOMY    . CARPAL TUNNEL RELEASE Bilateral   . CATARACT EXTRACTION W/ INTRAOCULAR LENS  IMPLANT, BILATERAL Bilateral   . CORONARY/GRAFT ACUTE MI REVASCULARIZATION N/A 02/28/2018   Procedure: Coronary/Graft Acute MI Revascularization;  Surgeon: Jettie Booze, MD;  Location: [place] [facility];  Service: Cardiovascular;  Laterality: N/A;  . ESOPHAGOGASTRODUODENOSCOPY N/A 03/05/2018   Procedure: ESOPHAGOGASTRODUODENOSCOPY (EGD);  Surgeon: Ladene Artist, MD;  Location: [facility] ENDOSCOPY;  Service: Endoscopy;  Laterality: N/A;  . LEFT HEART CATH AND CORONARY ANGIOGRAPHY N/A 02/28/2018   Procedure: LEFT HEART CATH AND CORONARY ANGIOGRAPHY;  Surgeon: Jettie Booze, MD;  Location: [place] [facility];  Service: Cardiovascular;  Laterality: N/A;  . NECK EXPLORATION  07/16/2015  . PARATHYROIDECTOMY Right 07/16/2015   superior  . PARATHYROIDECTOMY Right 07/16/2015   Procedure: RIGHT INFERIOR PARATHYROIDECTOMY;  Surgeon: Armandina Gemma, MD;  Location: [place];  Service: General;  Laterality: Right;  . SUBMUCOSAL INJECTION  03/05/2018   Procedure: SUBMUCOSAL INJECTION;  Surgeon: Ladene Artist, MD;  Location: [facility] ENDOSCOPY;  Service: Endoscopy;;  . TOENAIL AVULSION Bilateral    great toe     A IV Location/Drains/Wounds Patient Lines/Drains/Airways Status   Active Line/Drains/Airways    Name:   Placement date:   Placement time:   Site:   Days:   Peripheral IV 03/29/18 Right Antecubital   03/29/18  1142    Antecubital   less than 1   External Urinary Catheter   03/01/18    1230    -   28   Incision (Closed) 07/16/15 Neck Other (Comment)   07/16/15    1305     987          Intake/Output Last 24 hours No intake or output data in the 24 hours ending 03/29/18 1716  Labs/Imaging Results for orders placed or performed during the hospital  encounter of 03/29/18 (from the past 48 hour(s))  CBC with Differential     Status: Abnormal   Collection Time: 03/29/18 11:48 AM  Result Value Ref Range   WBC 16.7 (H) 4.0 - 10.5 K/uL   RBC 3.52 (L) 3.87 - 5.11 MIL/uL   Hemoglobin 8.9 (L) 12.0 - 15.0 g/dL   HCT 29.1 (L) 36.0 - 46.0 %   MCV 82.7 80.0 - 100.0 fL   MCH 25.3 (L) 26.0 - 34.0 pg   MCHC 30.6 30.0 - 36.0 g/dL   RDW 17.2 (H) 11.5 - 15.5 %   Platelets 448 (H) 150 - 400 K/uL   nRBC 0.0 0.0 - 0.2 %   Neutrophils Relative % 82 %   Neutro Abs 13.8 (H) 1.7 - 7.7 K/uL   Lymphocytes Relative 8 %   Lymphs Abs 1.4 0.7 - 4.0 K/uL   Monocytes Relative 8 %   Monocytes Absolute 1.3 (H) 0.1 - 1.0 K/uL   Eosinophils Relative 1 %   Eosinophils Absolute 0.1 0.0 - 0.5 K/uL   Basophils Relative 0 %   Basophils Absolute 0.1 0.0 - 0.1 K/uL   Immature Granulocytes 1 %   Abs Immature Granulocytes 0.13 (H) 0.00 - 0.07 K/uL    Comment: Performed at [facility], [address] [place], [place] [address]  Comprehensive metabolic panel     Status: Abnormal   Collection Time: 03/29/18 11:48 AM  Result Value Ref Range   Sodium 149 (H) 135 - 145 mmol/L   Potassium 3.8 3.5 - 5.1 mmol/L   Chloride 120 (H) 98 - 111 mmol/L   CO2 16 (L) 22 - 32 mmol/L   Glucose, Bld 102 (H) 70 - 99 mg/dL   BUN 17 8 - 23 mg/dL   Creatinine, Ser 1.39 (H) 0.44 - 1.00 mg/dL   Calcium 7.3 (L) 8.9 - 10.3 mg/dL   Total Protein 7.3 6.5 - 8.1 g/dL   Albumin 2.2 (L) 3.5 - 5.0 g/dL   AST 18 15 - 41 U/L   ALT 8 0 - 44 U/L   Alkaline Phosphatase 89 38 - 126 U/L   Total Bilirubin 1.1 0.3 - 1.2 mg/dL   GFR calc non Af Amer 36 (L) >60 mL/min   GFR calc Af Amer 42 (L) >60 mL/min   Anion gap 13 5 - 15    Comment: Performed at [facility], [place] [address], [place],  [address]  Urinalysis, Routine w reflex microscopic     Status: Abnormal   Collection Time: 03/29/18 11:48 AM  Result Value Ref Range   Color, Urine YELLOW YELLOW   APPearance HAZY (A) CLEAR    Specific Gravity, Urine 1.012 1.005 - 1.030   pH 5.0 5.0 - 8.0   Glucose, UA NEGATIVE NEGATIVE mg/dL   Hgb urine dipstick SMALL (A) NEGATIVE   Bilirubin Urine NEGATIVE NEGATIVE   Ketones, ur 20 (A) NEGATIVE mg/dL   Protein, ur NEGATIVE NEGATIVE mg/dL   Nitrite NEGATIVE  NEGATIVE   Leukocytes,Ua NEGATIVE NEGATIVE   RBC / HPF 0-5 0 - 5 RBC/hpf   WBC, UA 0-5 0 - 5 WBC/hpf   Bacteria, UA RARE (A) NONE SEEN   Squamous Epithelial / LPF 0-5 0 - 5   Hyaline Casts, UA PRESENT     Comment: Performed at [facility], [place] [address] [place], [place] [address]  Lipase, blood     Status: None   Collection Time: 03/29/18 11:48 AM  Result Value Ref Range   Lipase 42 11 - 51 U/L    Comment: Performed at [facility] [address] [place], [place] [address]  Influenza panel by PCR (type A & B)     Status: None   Collection Time: 03/29/18 11:53 AM  Result Value Ref Range   Influenza A By PCR NEGATIVE NEGATIVE   Influenza B By PCR NEGATIVE NEGATIVE    Comment: (NOTE) The Xpert Xpress Flu assay is intended as an aid in the diagnosis of  influenza and should not be used as a sole basis for treatment.  This  assay is FDA approved for nasopharyngeal swab specimens only. Nasal  washings and aspirates are unacceptable for Xpert Xpress Flu testing. Performed at [facility], [place] [address], [place], [place] [address]   I-Stat Troponin, ED (not at [facility])     Status: None   Collection Time: 03/29/18 12:00 PM  Result Value Ref Range   Troponin i, poc 0.01 0.00 - 0.08 ng/mL   Comment 3            Comment: Due to the release kinetics of cTnI, a negative result within the first hours of the onset of symptoms does not rule out myocardial infarction with certainty. If myocardial infarction is still suspected, repeat the test at appropriate intervals.    Dg Chest Portable 1 View  Result Date: 03/29/2018 CLINICAL DATA:  Cough, shortness of breath. EXAM: PORTABLE CHEST 1 VIEW COMPARISON:   Radiograph of March 02, 2018. FINDINGS: The heart size and mediastinal contours are within normal limits. Both lungs are clear. No pneumothorax or pleural effusion is noted. The visualized skeletal structures are unremarkable. IMPRESSION: No active disease. Electronically Signed   By: Marijo Conception, M.D.   On: 03/29/2018 12:28   Ct Renal Stone Study  Result Date: 03/29/2018 CLINICAL DATA:  Right flank pain and dysuria since yesterday. History of renal calculi. 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 EXAM: CT ABDOMEN AND PELVIS WITHOUT CONTRAST TECHNIQUE: Multidetector CT imaging of the abdomen and pelvis was performed following the standard protocol without IV contrast. COMPARISON:  Abdomen and pelvis radiograph dated 03/02/2018 and abdomen and pelvis CT dated 02/08/2009. FINDINGS: Lower chest: Small bilateral pleural effusions, larger on the right. Minimal bilateral dependent atelectasis. Minimal pericardial effusion with a maximum thickness of 3 mm. Hepatobiliary: No focal liver abnormality is seen. Status post cholecystectomy. No biliary dilatation. Pancreas: Unremarkable. No pancreatic ductal dilatation or surrounding inflammatory changes. Spleen: Normal in size without focal abnormality. Adrenals/Urinary Tract: Normal appearing adrenal glands. 2.5 cm left renal cyst. Somewhat small right kidney. Unremarkable ureters and urinary bladder. No urinary tract calculi or hydronephrosis. Stomach/Bowel: 2 metallic clips in the stomach. Similar-appearing metallic clip in the rectum posteriorly on the right, within a small amount of stool. Unremarkable small bowel and appendix. Vascular/Lymphatic: Atheromatous arterial calcifications without aneurysm. No enlarged lymph nodes. Reproductive:  Status post hysterectomy. No adnexal masses. Other: Midline surgical scar. Musculoskeletal: Minimal lumbar and lower thoracic  spine degenerative changes. IMPRESSION: 1. No urinary tract calculi or hydronephrosis. 2. Small bilateral pleural effusions, larger on the right. 3. Minimal pericardial effusion. 4. Mild to moderate right renal atrophy. 5. Two the previously placed gastric clips are in the stomach and one is in the rectum within a small amount of stool. Electronically Signed   By: Claudie Revering M.D.   On: 03/29/2018 14:20    Pending Labs Unresulted Labs (From admission, onward)    Start     Ordered   03/29/18 1149  Urine culture  ONCE - STAT,   STAT     03/29/18 1149          Vitals/Pain Today's Vitals   03/29/18 1230 03/29/18 1400 03/29/18 1445 03/29/18 1530  BP: (!) 168/81 (!) 159/76  (!) 159/76  Pulse: 88 95  91  Resp: (!) 25 (!) 24  (!) 22  Temp:   99.4 F (37.4 C)   TempSrc:   Rectal   SpO2: 96% 96%  96%  Weight:      Height:      PainSc:        Isolation Precautions No active isolations  Medications Medications  0.9 %  sodium chloride infusion ( Intravenous New Bag/Given 03/29/18 1159)  sodium chloride 0.9 % bolus 500 mL (500 mLs Intravenous New Bag/Given 03/29/18 1450)    Mobility walks with device High fall risk   Focused Assessments    R Recommendations: See Admitting Provider Note  Report given to:   Additional Notes:

## 2018-03-29 NOTE — ED Triage Notes (Signed)
Pt c/o sob that began last night along with a non productive cough that began this morning ; pt also reports dysuria that began yesterday also c/o right sided flank pain; pt denies any chest pain at this time

## 2018-03-29 NOTE — H&P (Signed)
History and Physical    [name] YQM:[id number] DOB: April 15, 1940 DOA: 03/29/2018  PCP: Glendale Chard, MD Consultants: [facility] - cardiology; [facility] - rheumatology; [facility] - Maia Petties - oncology Patient coming from:  Home - lives with husband; NOK: Husband, [phone]  Chief Complaint: weakness  HPI: [name] is a 78 y.o. female with medical history significant of seizures; RA; HTN; HLD; and CKD presenting with SOB.  The patient had a stent placed about a month ago.  Since then, she is just laying in bed every day.  She has an in-home nurse twice a week.  The nurse was concerned about SOB and sent her in by ambulance.  She is not currently SOB but has been for about 2-3 days.  Slight cough, nonproductive.  No sick contacts.  She intermittently does not eat/drink as much as usual.  No GI symptoms.  With her RA, she is very limited with her mobility - she needs assistance with ambulation.  They were not in favor of SNF admission last time, but they would consider it now - vs. Home health for 5 hours a day, which is pending with insurance now.   ED Course:  SOB, flank pain, urinary symptoms.  Previously recommended for SNF but now failing to thrive.  Looks frail and dehydrated.  Review of Systems: As per HPI; otherwise review of systems reviewed and negative.   Ambulatory Status:  Requires significant assistance  Past Medical History:  Diagnosis Date   Acute kidney injury superimposed on chronic kidney disease ([place]) 02/06/2015   Anxiety    CAD (coronary artery disease) 02/28/2018   s/p stent   Depression    DVT (deep venous thrombosis) ([place]) 1970s   LLE   Heart murmur    Hypercholesterolemia    Hypertension    Kidney stones    Migraine    "a few/year" (07/16/2015)   Multiple falls    Pneumonia "several times"   Rheumatoid arthritis(714.0)    "all over" (07/16/2015)   Seizure ([place])    "last one was 1//2017" (07/16/2015)   Syncope and collapse     Vision abnormalities     Past Surgical History:  Procedure Laterality Date   ABDOMINAL HYSTERECTOMY     CARPAL TUNNEL RELEASE Bilateral    CATARACT EXTRACTION W/ INTRAOCULAR LENS  IMPLANT, BILATERAL Bilateral    CORONARY/GRAFT ACUTE MI REVASCULARIZATION N/A 02/28/2018   Procedure: Coronary/Graft Acute MI Revascularization;  Surgeon: Jettie Booze, MD;  Location: [facility];  Service: Cardiovascular;  Laterality: N/A;   ESOPHAGOGASTRODUODENOSCOPY N/A 03/05/2018   Procedure: ESOPHAGOGASTRODUODENOSCOPY (EGD);  Surgeon: Ladene Artist, MD;  Location: [facility] ENDOSCOPY;  Service: Endoscopy;  Laterality: N/A;   LEFT HEART CATH AND CORONARY ANGIOGRAPHY N/A 02/28/2018   Procedure: LEFT HEART CATH AND CORONARY ANGIOGRAPHY;  Surgeon: Jettie Booze, MD;  Location: [facility];  Service: Cardiovascular;  Laterality: N/A;   NECK EXPLORATION  07/16/2015   PARATHYROIDECTOMY Right 07/16/2015   superior   PARATHYROIDECTOMY Right 07/16/2015   Procedure: RIGHT INFERIOR PARATHYROIDECTOMY;  Surgeon: Armandina Gemma, MD;  Location: [facility];  Service: General;  Laterality: Right;   SUBMUCOSAL INJECTION  03/05/2018   Procedure: SUBMUCOSAL INJECTION;  Surgeon: Ladene Artist, MD;  Location: [facility];  Service: Endoscopy;;   TOENAIL AVULSION Bilateral    great toe    Social History   Socioeconomic History   Marital status: Married    Spouse name: Not on file   Number of children: Not  on file   Years of education: Not on file   Highest education level: Not on file  Occupational History   Occupation: retired  Scientist, product/process development strain: Somewhat hard   Food insecurity:    Worry: Never true    Inability: Never true   Transportation needs:    Medical: Yes    Non-medical: No  Tobacco Use   Smoking status: Former Smoker    Packs/day: 1.00    Years: 62.00    Pack years: 62.00    Types: Cigarettes    Last attempt to quit: 01/2018    Years since  quitting: 0.1   Smokeless tobacco: Never Used  Substance and Sexual Activity   Alcohol use: No   Drug use: No   Sexual activity: Never  Lifestyle   Physical activity:    Days per week: Not on file    Minutes per session: Not on file   Stress: Not on file  Relationships   Social connections:    Talks on phone: Not on file    Gets together: Not on file    Attends religious service: Not on file    Active member of club or organization: Not on file    Attends meetings of clubs or organizations: Not on file    Relationship status: Not on file   Intimate partner violence:    Fear of current or ex partner: Not on file    Emotionally abused: Not on file    Physically abused: Not on file    Forced sexual activity: Not on file  Other Topics Concern   Not on file  Social History Narrative   Not on file    No Known Allergies  Family History  Problem Relation Age of Onset   Cancer Father    Prostate cancer Father    Cancer Brother    Heart disease Brother    Miscarriages / Stillbirths Brother    Heart disease Mother    Pneumonia Mother     Prior to Admission medications   Medication Sig Start Date End Date Taking? Authorizing Provider  Adalimumab (HUMIRA PEN [place]) Inject into the skin.    [provider]  atorvastatin (LIPITOR) 80 MG tablet Take 1 tablet (80 mg total) by mouth daily at 6 PM. 03/08/18   Bhagat, Bhavinkumar, PA  carvedilol (COREG) 12.5 MG tablet Take 1 tablet (12.5 mg total) by mouth 2 (two) times daily with a meal. 03/08/18   Bhagat, Bhavinkumar, PA  Cholecalciferol (VITAMIN D) 125 MCG (5000 UT) CAPS Take 1 capsule by mouth daily. 03/26/18   Glendale Chard, MD  hydroxychloroquine (PLAQUENIL) 200 MG tablet Take 200 mg by mouth daily.  08/02/15   [provider]  levETIRAcetam (KEPPRA) 500 MG tablet TAKE 2 TABLETS BY MOUTH EVERY DAY 03/22/18   Glendale Chard, MD  nitroGLYCERIN (NITROSTAT) 0.4 MG SL tablet Place 1 tablet (0.4 mg total)  under the tongue every 5 (five) minutes as needed. 03/08/18   Jettie Booze, MD  ondansetron (ZOFRAN) 4 MG tablet Take 1 tablet (4 mg total) by mouth every 8 (eight) hours as needed for nausea or vomiting. Patient not taking: Reported on 02/28/2018 09/23/16   Julianne Rice, MD  pantoprazole (PROTONIX) 40 MG tablet Take 1 tablet (40 mg total) by mouth daily. 03/09/18   Leanor Kail, PA  ticagrelor (BRILINTA) 90 MG TABS tablet Take 1 tablet (90 mg total) by mouth 2 (two) times daily. 03/08/18   Leanor Kail,  PA  Vitamin D, Ergocalciferol, (DRISDOL) 50000 units CAPS capsule Take 1 capsule (50,000 Units total) by mouth every 7 (seven) days. 08/14/16   Modena Jansky, MD    Physical Exam: Vitals:   03/29/18 1230 03/29/18 1400 03/29/18 1445 03/29/18 1530  BP: (!) 168/81 (!) 159/76  (!) 159/76  Pulse: 88 95  91  Resp: (!) 25 (!) 24  (!) 22  Temp:   99.4 F (37.4 C)   TempSrc:   Rectal   SpO2: 96% 96%  96%  Weight:      Height:          General:  Appears frail and cachectic  Eyes:  PERRL, EOMI, normal lids, iris  ENT:  grossly normal hearing, lips & tongue, mildly dry mm  Neck:  no LAD, masses or thyromegaly  Cardiovascular:  RRR, no m/r/g. No LE edema.   Respiratory:   CTA bilaterally with no wheezes/rales/rhonchi.  Normal respiratory effort.  Abdomen:  soft, NT, ND, NABS  Skin:  no rash or induration seen on limited exam  Musculoskeletal:  grossly normal tone BUE/BLE, good ROM, no bony abnormality  Psychiatric: blunted mood and affect, speech fluent and appropriate, AOx3  Neurologic:  CN 2-12 grossly intact, moves all extremities in coordinated fashion, sensation intact    Radiological Exams on Admission: Dg Chest Portable 1 View  Result Date: 03/29/2018 CLINICAL DATA:  Cough, shortness of breath. EXAM: PORTABLE CHEST 1 VIEW COMPARISON:  Radiograph of March 02, 2018. FINDINGS: The heart size and mediastinal contours are within normal limits. Both  lungs are clear. No pneumothorax or pleural effusion is noted. The visualized skeletal structures are unremarkable. IMPRESSION: No active disease. Electronically Signed   By: Marijo Conception, M.D.   On: 03/29/2018 12:28   Ct Renal Stone Study  Result Date: 03/29/2018 CLINICAL DATA:  Right flank pain and dysuria since yesterday. History of renal calculi. 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 EXAM: CT ABDOMEN AND PELVIS WITHOUT CONTRAST TECHNIQUE: Multidetector CT imaging of the abdomen and pelvis was performed following the standard protocol without IV contrast. COMPARISON:  Abdomen and pelvis radiograph dated 03/02/2018 and abdomen and pelvis CT dated 02/08/2009. FINDINGS: Lower chest: Small bilateral pleural effusions, larger on the right. Minimal bilateral dependent atelectasis. Minimal pericardial effusion with a maximum thickness of 3 mm. Hepatobiliary: No focal liver abnormality is seen. Status post cholecystectomy. No biliary dilatation. Pancreas: Unremarkable. No pancreatic ductal dilatation or surrounding inflammatory changes. Spleen: Normal in size without focal abnormality. Adrenals/Urinary Tract: Normal appearing adrenal glands. 2.5 cm left renal cyst. Somewhat small right kidney. Unremarkable ureters and urinary bladder. No urinary tract calculi or hydronephrosis. Stomach/Bowel: 2 metallic clips in the stomach. Similar-appearing metallic clip in the rectum posteriorly on the right, within a small amount of stool. Unremarkable small bowel and appendix. Vascular/Lymphatic: Atheromatous arterial calcifications without aneurysm. No enlarged lymph nodes. Reproductive: Status post hysterectomy. No adnexal masses. Other: Midline surgical scar. Musculoskeletal: Minimal lumbar and lower thoracic spine degenerative changes. IMPRESSION: 1. No urinary tract calculi or hydronephrosis. 2. Small bilateral pleural  effusions, larger on the right. 3. Minimal pericardial effusion. 4. Mild to moderate right renal atrophy. 5. Two the previously placed gastric clips are in the stomach and one is in the rectum within a small amount of stool. Electronically Signed   By: Claudie Revering M.D.   On: 03/29/2018 14:20    EKG: Independently reviewed.  NSR with rate 89; nonspecific ST changes with no evidence of acute ischemia; NSCSLT   Labs on Admission: I have personally reviewed the available labs and imaging studies at the time of the admission.  Pertinent labs:   Na++ 149 Glucose 120 BUN 17/Creatinine 1.39/GFR 42 - improved from prior Troponin 0.01 WBC 16.7 Hgb 8.9 - stable Platelets 448 UA: small Hgb, 20 ketones Flu negative  Assessment/Plan Principal Problem:   Dehydration Active Problems:   Chronic kidney disease, stage III (moderate) (HCC)   Essential hypertension   Failure to thrive in adult   Dehydration -Patient with generally unremarkable evaluation for SOB and fatigue -UA with ketonuria and BMP with hypernatremia -Likely associated with FTT (see below) -Will observe for now with IVF hydration  Adult FTT -Patient with admission in 2/20 from CAD requiring stent placement -She was placed on DAPT and developed upper GI hemorrhage requiring transfusion -She was discharged on Brilinta -She was recommended for SNF placement and family declined -They went home with [facility] and she is receiving occasional nursing visits and therapy visits but her husband is working -The patient has not been thriving at home -She appears likely to need placement -Will order PT/OT/nutrition consults -Anticipate need for d/c to SNF so SW placement requested -She does not appear to meet admission criteria at this time, but has a qualifying inpatient stay recently -Family requests [place]  Stage 3 CKD with anemia -Appears to be stable at this time -Appears to have stable anemia, as  well  HTN -Continue Coreg  Seizures -Continue Keppra  RA -Continue Plaquenil given presumed non-infectious nature of presentation -Hold Humira for now -PT/OT consults  HLD -Continue Lipitor high-dose, given recent MI -Continue Brilinta for recent CAD    DVT prophylaxis:  Lovenox  Code Status:  Full - confirmed with patient/family Family Communication: Husband present throughout evaluation  Disposition Plan:  To be determined Consults called: PT/OT/Nutrition/SW Admission status: It is my clinical opinion that referral for OBSERVATION is reasonable and necessary in this patient based on the above information provided. The aforementioned taken together are felt to place the patient at high risk for further clinical deterioration. However it is anticipated that the patient may be medically stable for discharge from the hospital within 24 to 48 hours.    Karmen Bongo MD [facility]   How to contact the [facility] Attending or Consulting provider [place] or covering provider during after hours [place], for this patient?  1. Check the care team in [facility] and look for a) attending/consulting TRH provider listed and b) the [facility] team listed 2. Log into [email or web address] and use [place]'s universal password to access. If you do not have the password, please contact the hospital operator. 3. Locate the [name] provider you are looking for under [facility] and page to a number that you can be directly reached. 4. If you still have difficulty reaching the provider, please page the [facility] (Director on Call) for the Hospitalists listed on amion for assistance.   03/29/2018, 5:33 PM

## 2018-03-29 NOTE — Progress Notes (Signed)
Medical team:[place]  paged d/t tele repots of afib. Pt telemetry leads adjusted and does appear on the monitor periods of afib rotated with nsr.

## 2018-03-30 DIAGNOSIS — R627 Adult failure to thrive: Secondary | ICD-10-CM | POA: Diagnosis not present

## 2018-03-30 DIAGNOSIS — I10 Essential (primary) hypertension: Secondary | ICD-10-CM

## 2018-03-30 DIAGNOSIS — E860 Dehydration: Secondary | ICD-10-CM | POA: Diagnosis not present

## 2018-03-30 DIAGNOSIS — N183 Chronic kidney disease, stage 3 (moderate): Secondary | ICD-10-CM | POA: Diagnosis not present

## 2018-03-30 LAB — BASIC METABOLIC PANEL
Anion gap: 10 (ref 5–15)
BUN: 15 mg/dL (ref 8–23)
CO2: 16 mmol/L — ABNORMAL LOW (ref 22–32)
Calcium: 7.1 mg/dL — ABNORMAL LOW (ref 8.9–10.3)
Chloride: 119 mmol/L — ABNORMAL HIGH (ref 98–111)
Creatinine, Ser: 1.22 mg/dL — ABNORMAL HIGH (ref 0.44–1.00)
GFR calc Af Amer: 49 mL/min — ABNORMAL LOW (ref >60)
GFR, EST NON AFRICAN AMERICAN: 42 mL/min — AB (ref >60)
Glucose, Bld: 89 mg/dL (ref 70–99)
Potassium: 3.1 mmol/L — ABNORMAL LOW (ref 3.5–5.1)
Sodium: 145 mmol/L (ref 135–145)

## 2018-03-30 LAB — CBC
HCT: 26.1 % — ABNORMAL LOW (ref 36.0–46.0)
Hemoglobin: 8.1 g/dL — ABNORMAL LOW (ref 12.0–15.0)
MCH: 25 pg — AB (ref 26.0–34.0)
MCHC: 31 g/dL (ref 30.0–36.0)
MCV: 80.6 fL (ref 80.0–100.0)
PLATELETS: 378 10*3/uL (ref 150–400)
RBC: 3.24 MIL/uL — ABNORMAL LOW (ref 3.87–5.11)
RDW: 17 % — ABNORMAL HIGH (ref 11.5–15.5)
WBC: 13.7 10*3/uL — ABNORMAL HIGH (ref 4.0–10.5)
nRBC: 0 % (ref 0.0–0.2)

## 2018-03-30 LAB — URINE CULTURE: Culture: NO GROWTH

## 2018-03-30 MED ORDER — BOOST PLUS PO LIQD
237.0000 mL | Freq: Two times a day (BID) | ORAL | Status: DC
  Administered 2018-03-30 – 2018-03-31 (×2): 237 mL via ORAL
  Administered 2018-03-31: 14:00:00 via ORAL
  Administered 2018-04-01 – 2018-04-02 (×4): 237 mL via ORAL
  Filled 2018-03-30 (×9): qty 237

## 2018-03-30 MED ORDER — POTASSIUM CHLORIDE CRYS ER 20 MEQ PO TBCR
40.0000 meq | EXTENDED_RELEASE_TABLET | ORAL | Status: AC
  Administered 2018-03-30 (×2): 40 meq via ORAL
  Filled 2018-03-30 (×2): qty 2

## 2018-03-30 NOTE — TOC Initial Note (Signed)
Transition of Care [facility]) - Initial/Assessment Note    Patient Details  Name: [name] MRN: [id number] Date of Birth: [birth year]  Transition [facility]) CM/SW Contact:    Gelene Mink, [place] Phone Number: 03/30/2018, 5:05 PM  Clinical Narrative:                  CSW met with patient at bedside. Patient was alert and oriented. CSW introduced herself and explained role. Patient is familiar with SNF. Patient has been to [facility] in the past. Patient would like to return if possible. CSW received permission to fax patient out to other local SNF's.   Expected Discharge Plan: Skilled Nursing Facility Barriers to Discharge: Insurance Authorization   Patient Goals and CMS Choice Patient states their goals for this hospitalization and ongoing recovery are:: Patient wants to return  CMS Medicare.gov Compare Post Acute Care list provided to:: Patient Choice offered to / list presented to : Patient  Expected Discharge Plan and Services Expected Discharge Plan: [place] Discharge Planning Services: NA Post Acute Care Choice: [place] Living arrangements for the past 2 months: Single Family Home                 DME Arranged: N/A DME Agency: NA HH Arranged: NA HH Agency: [place]  Prior Living Arrangements/Services Living arrangements for the past 2 months: Single Family Home Lives with:: Self, Spouse Patient language and need for interpreter reviewed:: No Do you feel safe going back to the place where you live?: Yes      Need for Family Participation in Patient Care: No (Comment) Care giver support system in place?: Yes (comment)(Has familial support) Current home services: Home PT Criminal Activity/Legal Involvement Pertinent to Current Situation/Hospitalization: No - Comment as needed  Activities of Daily Living Home Assistive Devices/Equipment: Cane (specify quad or straight) ADL Screening (condition at time of  admission) Patient's cognitive ability adequate to safely complete daily activities?: Yes Is the patient deaf or have difficulty hearing?: No Does the patient have difficulty seeing, even when wearing glasses/contacts?: No Does the patient have difficulty concentrating, remembering, or making decisions?: No Patient able to express need for assistance with ADLs?: No Does the patient have difficulty dressing or bathing?: Yes Independently performs ADLs?: No Communication: Independent Dressing (OT): Needs assistance Is this a change from baseline?: Pre-admission baseline Grooming: Needs assistance Is this a change from baseline?: Pre-admission baseline Feeding: Needs assistance Is this a change from baseline?: Pre-admission baseline Bathing: Needs assistance Is this a change from baseline?: Pre-admission baseline Toileting: Needs assistance Is this a change from baseline?: Pre-admission baseline In/Out Bed: Needs assistance Is this a change from baseline?: Pre-admission baseline Walks in Home: Needs assistance Is this a change from baseline?: Pre-admission baseline Does the patient have difficulty walking or climbing stairs?: Yes Weakness of Legs: Both Weakness of Arms/Hands: Both  Permission Sought/Granted Permission sought to share information with : Case Manager Permission granted to share information with : Yes, Verbal Permission Granted  Share Information with NAME: Everlina Gotts  Permission granted to share info w AGENCY: All SNF  Permission granted to share info w Relationship: Spouse     Emotional Assessment Appearance:: Appears stated age Attitude/Demeanor/Rapport: Unable to Assess Affect (typically observed): Unable to Assess Orientation: : Oriented to Self, Oriented to  Time, Oriented to Place, Oriented to Situation Alcohol / Substance Use: Never Used Psych Involvement: No (comment)  Admission diagnosis:  Dehydration [E86.0] Hypernatremia [E87.0] Patient  Active  Problem List   Diagnosis Date Noted  . Dehydration 03/29/2018  . Failure to thrive in adult 03/29/2018  . Acute blood loss anemia   . Occult blood in stools   . Gastric AVM   . STEMI (ST elevation myocardial infarction) ([place]) 02/28/2018  . Acute inferior myocardial infarction ([place]) 02/28/2018  . Respiratory arrest ([place])   . Essential hypertension   . Respiratory failure ([place])   . SIRS (systemic inflammatory response syndrome) ([place]) 08/11/2016  . Nausea vomiting and diarrhea 08/11/2016  . Renal failure (ARF), acute on chronic (HCC) 08/11/2016  . High risk medications (not anticoagulants) long-term use 04/20/2016  . Bilateral chronic knee pain 04/20/2016  . Pain in left elbow 04/20/2016  . Chronic kidney disease, stage III (moderate) ([place]) 08/03/2015  . Encounter for general adult medical examination without abnormal findings 08/03/2015  . 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 Other long term (current) drug therapy 08/03/2015  . Pain in right shoulder 08/03/2015  . Postmenopausal bleeding 08/03/2015  . Vitamin D deficiency 08/03/2015  . Localized pain of right shoulder joint 08/03/2015  . Primary hyperparathyroidism ([place]) 07/16/2015  . Hyperparathyroidism, primary ([place]) 07/14/2015  . Gait disturbance 03/24/2015  . Neck pain 03/24/2015  . Myalgia and myositis 03/24/2015  . Acute kidney injury superimposed on chronic kidney disease ([place]) 02/06/2015  . Leukocytosis 02/06/2015  . Seizure ([place]) 02/05/2015  . Acute encephalopathy 02/05/2015  . Abnormal MRI of head 02/02/2014  . Cognitive changes 02/02/2014  . Migraine without aura and without status migrainosus, not intractable 02/02/2014  . LOC (loss of consciousness) ([place])   . Syncope 01/12/2014  . Syncope and collapse 01/12/2014  . Hypertensive urgency 01/12/2014  . Rheumatoid arthritis ([place]) 01/12/2014  . Depression 01/12/2014   . Hyperlipidemia 01/12/2014   PCP:  Glendale Chard, MD Pharmacy:   [facility] [place], [place] - [address] [place] [address] Phone: [phone] Fax: [phone]  [facility] [place], [place] - [address] [place] [address] Phone: [phone] Fax: [phone]     Social Determinants of Health (SDOH) Interventions    Readmission Risk Interventions 30 Day Unplanned Readmission Risk Score     ED to Hosp-Admission (Discharged) from 02/27/2018 in [place] [facility]  30 Day Unplanned Readmission Risk Score (%)  11 Filed at 03/08/2018 1600     This score is the patient's risk of an unplanned readmission within 30 days of being discharged (0 -100%). The score is based on dignosis, age, lab data, medications, orders, and past utilization.   Low:  0-14.9   Medium: 15-21.9   High: 22-29.9   Extreme: 30 and above       Readmission Risk Prevention Plan 03/30/2018  Transportation Screening Complete  PCP or Specialist Appt within 5-7 Days Not Complete  Not Complete comments [place] Not Complete  Medication Review (RN CM) Not Complete  Some recent data might be hidden

## 2018-03-30 NOTE — Progress Notes (Signed)
PROGRESS NOTE    [name]  EZM:[id number] DOB: 02-23-1940 DOA: 03/29/2018 PCP: Glendale Chard, MD    Brief Narrative:  [name] is a 78 y.o. female with medical history significant of seizures; RA; HTN; HLD; and CKD stage III, CAD s/p stent placed about a month back, GI bleed.  The patient had a stent placed about a month ago, GI bleed found to be from AVMs was not doing well since the discharge patient was complaining of shortness of breath, concerning this patient is brought to the emergency department...  She is just laying in bed every day.  She has an in-home nurse twice a week.  The nurse was concerned about SOB and sent her in by ambulance.    Has mild dry cough.  Denies any shortness of breath at this time.   With her RA, she is very limited with her mobility - she needs assistance with ambulation.    Prior to discharge family refused for SNF, currently agreeable for SNF.    Assessment & Plan:   Principal Problem:   Dehydration Active Problems:   Chronic kidney disease, stage III (moderate) (HCC)   Essential hypertension   Failure to thrive in adult  ##Failure to thrive -Continue with calorie count -Continue with high-protein diet -No signs of infection is noted on urinalysis or chest x-ray.  Patient is afebrile.  Mild elevation of the WBC count most likely from dehydration  ##Debility -Patient will benefit going to skilled nursing facility -Discussed with the social worker, will obtain authorization on Monday for SNF placement.  ##Anemia chronic blood loss, anemia of chronic kidney disease -Patient had hemoglobin of 8.6 at the time of the discharge, trended down to 8.1.  Most likely dilutional  ##Dehydration -Patient had elevated sodium, was given IV fluids overnight -With improvement  ##Recent history of GI bleed -EGD showed AVMs, clipped -Continue with the Protonix  ##CAD S/P stent placement -Continue with the Brilinta,  statin  ##Hypertension -Continue with the Coreg  ##Seizures -Continue with Keppra  Rheumatoid arthritis -Continue with the Plaquenil  Hyperlipidemia -Continue with Lipitor      DVT prophylaxis: SCDs Code Status: Full Family Communication: No family members present at bedside  disposition Plan: SNF when authorization is received     Subjective: Feeling hungry.  Patient did not order her breakfast this morning  Objective: Vitals:   03/29/18 1730 03/29/18 1846 03/29/18 2305 03/30/18 0738  BP: (!) 164/91 (!) 157/69 (!) 145/72 127/66  Pulse: 98 (!) 101 86 76  Resp: 18 20 20 16   Temp:  98.6 F (37 C) 98.1 F (36.7 C) 98.2 F (36.8 C)  TempSrc:  Oral Oral Oral  SpO2: 97% 98% 95% 96%  Weight:  45.1 kg    Height:  5\' 1"  (1.549 m)     No intake or output data in the 24 hours ending 03/30/18 1246 Filed Weights   03/29/18 1113 03/29/18 1846  Weight: 49.1 kg 45.1 kg    Examination:  General exam: Appears calm and comfortable  Respiratory system: Clear to auscultation. Respiratory effort normal. Cardiovascular system: S1 & S2 heard, RRR. No JVD, murmurs, rubs, gallops or clicks. No pedal edema. Gastrointestinal system: Abdomen is nondistended, soft and nontender. No organomegaly or masses felt. Normal bowel sounds heard. Central nervous system: Alert and oriented. No focal neurological deficits. Extremities: Symmetric 5 x 5 power. Skin: No rashes, lesions or ulcers Psychiatry: Judgement and insight appear normal. Mood & affect appropriate.  Data Reviewed: I have personally reviewed following labs and imaging studies  CBC: Recent Labs  Lab 03/29/18 1148 03/30/18 0228  WBC 16.7* 13.7*  NEUTROABS 13.8*  --   HGB 8.9* 8.1*  HCT 29.1* 26.1*  MCV 82.7 80.6  PLT 448* 175   Basic Metabolic Panel: Recent Labs  Lab 03/29/18 1148 03/30/18 0228  NA 149* 145  K 3.8 3.1*  CL 120* 119*  CO2 16* 16*  GLUCOSE 102* 89  BUN 17 15  CREATININE 1.39* 1.22*   CALCIUM 7.3* 7.1*   GFR: Estimated Creatinine Clearance: 26.6 mL/min (A) (by C-G formula based on SCr of 1.22 mg/dL (H)). Liver Function Tests: Recent Labs  Lab 03/29/18 1148  AST 18  ALT 8  ALKPHOS 89  BILITOT 1.1  PROT 7.3  ALBUMIN 2.2*   Recent Labs  Lab 03/29/18 1148  LIPASE 42   No results for input(s): AMMONIA in the last 168 hours. Coagulation Profile: No results for input(s): INR, PROTIME in the last 168 hours. Cardiac Enzymes: No results for input(s): CKTOTAL, CKMB, CKMBINDEX, TROPONINI in the last 168 hours. BNP (last 3 results) No results for input(s): PROBNP in the last 8760 hours. HbA1C: No results for input(s): HGBA1C in the last 72 hours. CBG: No results for input(s): GLUCAP in the last 168 hours. Lipid Profile: No results for input(s): CHOL, HDL, LDLCALC, TRIG, CHOLHDL, LDLDIRECT in the last 72 hours. Thyroid Function Tests: No results for input(s): TSH, T4TOTAL, FREET4, T3FREE, THYROIDAB in the last 72 hours. Anemia Panel: No results for input(s): VITAMINB12, FOLATE, FERRITIN, TIBC, IRON, RETICCTPCT in the last 72 hours. Sepsis Labs: No results for input(s): PROCALCITON, LATICACIDVEN in the last 168 hours.  Recent Results (from the past 240 hour(s))  Urine culture     Status: None   Collection Time: 03/29/18  1:33 PM  Result Value Ref Range Status   Specimen Description URINE, CATHETERIZED  Final   Special Requests NONE  Final   Culture   Final    NO GROWTH Performed at [facility], [address] [place], [place] [address]    Report Status 03/30/2018 FINAL  Final         Radiology Studies: Dg Chest Portable 1 View  Result Date: 03/29/2018 CLINICAL DATA:  Cough, shortness of breath. EXAM: PORTABLE CHEST 1 VIEW COMPARISON:  Radiograph of March 02, 2018. FINDINGS: The heart size and mediastinal contours are within normal limits. Both lungs are clear. No pneumothorax or pleural effusion is noted. The visualized skeletal structures  are unremarkable. IMPRESSION: No active disease. Electronically Signed   By: Marijo Conception, M.D.   On: 03/29/2018 12:28   Ct Renal Stone Study  Result Date: 03/29/2018 CLINICAL DATA:  Right flank pain and dysuria since yesterday. History of renal calculi. 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 EXAM: CT ABDOMEN AND PELVIS WITHOUT CONTRAST TECHNIQUE: Multidetector CT imaging of the abdomen and pelvis was performed following the standard protocol without IV contrast. COMPARISON:  Abdomen and pelvis radiograph dated 03/02/2018 and abdomen and pelvis CT dated 02/08/2009. FINDINGS: Lower chest: Small bilateral pleural effusions, larger on the right. Minimal bilateral dependent atelectasis. Minimal pericardial effusion with a maximum thickness of 3 mm. Hepatobiliary: No focal liver abnormality is seen. Status post cholecystectomy. No biliary dilatation. Pancreas: Unremarkable. No pancreatic ductal dilatation or surrounding inflammatory changes. Spleen: Normal in size without focal abnormality. Adrenals/Urinary Tract: Normal appearing adrenal  glands. 2.5 cm left renal cyst. Somewhat small right kidney. Unremarkable ureters and urinary bladder. No urinary tract calculi or hydronephrosis. Stomach/Bowel: 2 metallic clips in the stomach. Similar-appearing metallic clip in the rectum posteriorly on the right, within a small amount of stool. Unremarkable small bowel and appendix. Vascular/Lymphatic: Atheromatous arterial calcifications without aneurysm. No enlarged lymph nodes. Reproductive: Status post hysterectomy. No adnexal masses. Other: Midline surgical scar. Musculoskeletal: Minimal lumbar and lower thoracic spine degenerative changes. IMPRESSION: 1. No urinary tract calculi or hydronephrosis. 2. Small bilateral pleural effusions, larger on the right. 3. Minimal pericardial effusion. 4. Mild to moderate right renal  atrophy. 5. Two the previously placed gastric clips are in the stomach and one is in the rectum within a small amount of stool. Electronically Signed   By: Claudie Revering M.D.   On: 03/29/2018 14:20        Scheduled Meds:  atorvastatin  80 mg Oral q1800   carvedilol  12.5 mg Oral BID WC   docusate sodium  100 mg Oral BID   enoxaparin (LOVENOX) injection  30 mg Subcutaneous Q24H   hydroxychloroquine  200 mg Oral Daily   levETIRAcetam  1,000 mg Oral Daily   pantoprazole  40 mg Oral Daily   potassium chloride  40 mEq Oral Q4H   ticagrelor  90 mg Oral BID   Continuous Infusions:  lactated ringers 100 mL/hr at 03/29/18 2113     LOS: 0 days    Time spent: 40 minutes   Olumide Dolinger, MD [facility] Pager [phone]  If 7PM-7AM, please contact night-coverage [email or web address] Password [facility] 03/30/2018, 12:46 PM

## 2018-03-30 NOTE — Progress Notes (Signed)
Occupational Therapy Evaluation Patient Details Name: [name] MRN: [id number] DOB: 1940/09/12 Today's Date: 03/30/2018    History of Present Illness Pt is a 78 y.o. F with significant PMH of seizures, RA, and CKD presenting with SOB. Had stent placed about a month ago and has been mostly sedentary. Admitted with dehydration and failure to thrive.    Clinical Impression   PTA, pt was living at home with her husband and daughter, pt reports she required assistance with all ADL/IADL and required assistance for functional mobility at RW level. Pt currently requires minA-modA for toilet transfer and toileting hygiene, pt requires minA for functional mobility at RW level. Due to decline in current level of function, pt would benefit from acute OT to address established goals to facilitate safe D/C to venue listed below. At this time, recommend SNF follow-up. Will continue to follow acutely.     Follow Up Recommendations  SNF;Supervision/Assistance - 24 hour    Equipment Recommendations  Other (comment)(defer to next venue)    Recommendations for Other Services       Precautions / Restrictions Precautions Precautions: Fall Restrictions Weight Bearing Restrictions: No      Mobility Bed Mobility Overal bed mobility: Needs Assistance Bed Mobility: Supine to Sit     Supine to sit: Min assist     General bed mobility comments: MinA to assist trunk elevation and scoot hips to EOB;  Transfers Overall transfer level: Needs assistance Equipment used: Rolling walker (2 wheeled) Transfers: Sit to/from [facility] Sit to Stand: Mod assist Stand pivot transfers: Min assist       General transfer comment: posterior lean, modA to powerup into standing;minA for support with standpivot    Balance Overall balance assessment: Needs assistance Sitting-balance support: Bilateral upper extremity supported;Feet supported Sitting balance-Leahy Scale: Poor Sitting  balance - Comments: intermittent posterior lean Postural control: Posterior lean Standing balance support: Single extremity supported;During functional activity Standing balance-Leahy Scale: Poor Standing balance comment: required S UE support progressing to BUE support,decreased activity tolerance required assistance to complete ADL while standing                           ADL either performed or assessed with clinical judgement   ADL Overall ADL's : Needs assistance/impaired     Grooming: Wash/dry hands;Set up   Upper Body Bathing: Minimal assistance   Lower Body Bathing: Moderate assistance;Sit to/from stand   Upper Body Dressing : Minimal assistance   Lower Body Dressing: Moderate assistance;Sit to/from stand   Toilet Transfer: Moderate assistance;Stand-pivot;RW Toilet Transfer Details (indicate cue type and reason): modA to stand pivot from EOB to [facility], pt had BM  Toileting- Clothing Manipulation and Hygiene: Minimal assistance;Moderate assistance;Sit to/from stand Toileting - Clothing Manipulation Details (indicate cue type and reason): pt able to complete with minA for suppoort in standing, modA to stand from [facility], minA for thorough pericare      Functional mobility during ADLs: Minimal assistance;Moderate assistance;Rolling walker General ADL Comments: minA for static standing;modAfor mobility from lower surfaces     Vision         Perception     Praxis      Pertinent Vitals/Pain Pain Assessment: No/denies pain     Hand Dominance Right   Extremity/Trunk Assessment Upper Extremity Assessment Upper Extremity Assessment: Generalized weakness   Lower Extremity Assessment Lower Extremity Assessment: Generalized weakness   Cervical / Trunk Assessment Cervical / Trunk Assessment: Kyphotic   Communication  Communication Communication: No difficulties   Cognition Arousal/Alertness: Awake/alert Behavior During Therapy: Flat affect Overall Cognitive  Status: No family/caregiver present to determine baseline cognitive functioning                                     General Comments  no family/caregiver present this session    Exercises     Shoulder Instructions      Home Living Family/patient expects to be discharged to:: Skilled nursing facility                                 Additional Comments: Pt lives with her husband (who works) and 56 y.o. grandson      Prior Functioning/Environment Level of Independence: Needs Product/process development scientist / Transfers Assistance Needed: ambulates and transfers with walker and assistance ADL's / Homemaking Assistance Needed: granddaughter/husband/aide assists with all ADL's/IADL's.    Comments: Pt has aide 2x/week per chart review        OT Problem List: Decreased strength;Decreased range of motion;Decreased activity tolerance;Impaired balance (sitting and/or standing);Decreased safety awareness;Decreased knowledge of use of DME or AE      OT Treatment/Interventions: Self-care/ADL training;Therapeutic exercise;DME and/or AE instruction;Therapeutic activities;Patient/family education;Balance training    OT Goals(Current goals can be found in the care plan section) Acute Rehab OT Goals Patient Stated Goal: Agreeable to rehab at SNF before return home OT Goal Formulation: With patient Time For Goal Achievement: 04/13/18 Potential to Achieve Goals: Fair  OT Frequency: Min 2X/week   Barriers to D/C: Decreased caregiver support  pt does not have care 24/7 at home       Co-evaluation PT/OT/SLP Co-Evaluation/Treatment: Yes Reason for Co-Treatment: To address functional/ADL transfers;For patient/therapist safety   OT goals addressed during session: ADL's and self-care      AM-PAC OT "6 Clicks" Daily Activity     Outcome Measure Help from another person eating meals?: None Help from another person taking care of personal grooming?: None Help from another person  toileting, which includes using toliet, bedpan, or urinal?: A Little Help from another person bathing (including washing, rinsing, drying)?: A Lot Help from another person to put on and taking off regular upper body clothing?: A Little Help from another person to put on and taking off regular lower body clothing?: A Lot 6 Click Score: 18   End of Session Equipment Utilized During Treatment: Gait belt;Rolling walker Nurse Communication: Mobility status  Activity Tolerance: Patient tolerated treatment well Patient left: in chair;with call bell/phone within reach;with chair alarm set  OT Visit Diagnosis: Unsteadiness on feet (R26.81);Other abnormalities of gait and mobility (R26.89);Muscle weakness (generalized) (M62.81);Adult, failure to thrive (R62.7)                Time: 0753-0820 OT Time Calculation (min): 27 min Charges:  OT General Charges $OT Visit: 1 Visit OT Evaluation $OT Eval Moderate Complexity: Gully OTR/L Acute Rehabilitation Services Office: [place] 03/30/2018, 8:37 AM

## 2018-03-30 NOTE — Progress Notes (Signed)
Initial Nutrition Assessment  DOCUMENTATION CODES:  Severe malnutrition in context of acute illness/injury  INTERVENTION:  Boost Plus BID  Reviewed Room Service meal ordering  Food/meal preferences entered into meal system  48-Calorie Count initiated. Discussed order with patient and nursing. Left instructions and envelope on door. Documented her lunch intake today, which will serve as first of meals documented on.   NUTRITION DIAGNOSIS:  Severe Malnutrition related to acute illness, poor appetite(Deconditioning following prior hospitilization) as evidenced by moderate loss of muscle mass and loss of 5% bw x 1 month   GOAL:  Patient will meet greater than or equal to 90% of their needs  MONITOR:  PO intake, Labs, Supplement acceptance, Weight trends, I & O's  REASON FOR ASSESSMENT:  Consult Assessment of nutrition requirement/status, Calorie Count  ASSESSMENT:  78 y/o female PMHx seizures, RA, HTN, HLD, CKD. Was recently hospitalized 2/12-2/21 for STEMI s/p RCA stent w/ DAPT. Pt decided against therapy recommendations for SNF and returned home. Now presents w/ weakness and FTT. Has been bedbound x1 month and pt now agreeable to SNF. Admitted to facilitate SNF placement  Pt seen up in chair with her lunch tray in front of her. She had eaten 100% of her cake, 100% of her sweet tea, and 75% of her mac n cheese, but only 10% of her chicken and mashed potatoes. RD documented this on her meal ticket and placed in calorie count envelope.   Pt reports having poor intake since she left the hospital a month ago. She says she just has had a lack of appetite. Denies any other barriers to intake. No n/v/c. She did have 1 episode of diarrhea this morning, but this was an isolated event and she thinks it was "maybe because I drank too much water".   At home, she says her spouse oversaw her nutritional needs. He would prepare her meals, but she did not eat much of them. She did drink ~2 Choc Boost  supplements a day. Vitamin wise, she says she took Vit D3.   Weight wise, she reports a UBW of 120 lbs. Per chart, she was 108 lbs at time of her STEMI 1 month ago and is now 99.5 lbs. This is a loss of 5% bw and meets malnutrition criteria for the time frame. It does not appear she has weighed 120 lbs for the past several years.   At this time, pt has an extremely flat affect and was not very conversational. She did agree to RD ordering Boost plus BID. RD verified she knew how to utilize the room service menu. RD apprised patient of calorie count orders, how this was conducted and what it is designed to assess. She did mention she would like fresh fruit and extra sweet tea. RD will order this for her. She does not like meat; RD encouraged her to drink 100% of the Boosts.   Labs: Na: 149-> 145, Albumin: 2.2, K: 3.1, WBC:13.7, H/H:8.1/26.1. BUN/creat 17/1.39 -> 15/1.22 Meds: Colace, KCL, ppi, IVF  Recent Labs  Lab 03/29/18 1148 03/30/18 0228  NA 149* 145  K 3.8 3.1*  CL 120* 119*  CO2 16* 16*  BUN 17 15  CREATININE 1.39* 1.22*  CALCIUM 7.3* 7.1*  GLUCOSE 102* 89    NUTRITION - FOCUSED PHYSICAL EXAM:   Most Recent Value  Orbital Region  No depletion  Upper Arm Region  No depletion  Thoracic and Lumbar Region  Mild depletion  Buccal Region  No depletion  [employer]  No depletion  Clavicle Bone Region  Moderate depletion  Clavicle and Acromion Bone Region  Moderate depletion  Scapular Bone Region  Unable to assess  Dorsal Hand  No depletion  Patellar Region  No depletion  Anterior Thigh Region  Moderate depletion  Posterior Calf Region  Mild depletion  Edema (RD Assessment)  None  Hair  Reviewed  Eyes  Reviewed  Mouth  Reviewed  Skin  Reviewed  Nails  Reviewed     Diet Order:   Diet Order            Diet regular Room service appropriate? Yes; Fluid consistency: Thin  Diet effective now             EDUCATION NEEDS:  Not appropriate for education at this  time  Skin:  Skin Assessment: Reviewed RN Assessment  Last BM:  3/14  Height:  Ht Readings from Last 1 Encounters:  03/29/18 _0  (1.549 m)   Weight:  Wt Readings from Last 1 Encounters:  03/29/18 45.1 kg   Wt Readings from Last 10 Encounters:  03/29/18 45.1 kg  03/08/18 49.1 kg  10/08/17 49.1 kg  09/23/16 59 kg  08/12/16 58.2 kg  04/24/16 56.7 kg  12/20/15 54.9 kg  08/03/15 52.2 kg  07/16/15 50.2 kg  07/13/15 50.2 kg   Ideal Body Weight:  47.73 kg  BMI:  Body mass index is 18.79 kg/m.  Estimated Nutritional Needs:  Kcal:  1350-1550 (30-34 kcal/kg bw) Protein:  68-81g Pro (1.5-1.8g/kg bw) Fluid:  1.4-1.6 (1 ml/kcal)  Burtis Junes RD, LDN, CNSC Clinical Nutrition Available Tues-Sat via Pager: [phone] 03/30/2018 4:58 PM

## 2018-03-30 NOTE — Evaluation (Signed)
Physical Therapy Evaluation Patient Details Name: [name] MRN: [id number] DOB: [birth year] Today's Date: 03/30/2018   History of Present Illness  Pt is a 78 y.o. F with significant PMH of seizures, RA, and CKD presenting with SOB. Had stent placed about a month ago and has been mostly sedentary. Admitted with dehydration and failure to thrive.   Clinical Impression  Pt admitted with above diagnosis. Pt currently with functional limitations due to the deficits listed below (see PT Problem List). Prior to admission, pt has been living at home with her husband who works and has had increased difficulty performing ADL's and mobility using a walker. On PT evaluation, pt requiring up to moderate assistance for transfers with rolling walker and is unable to ambulate. Recommend SNF to maximize functional independence and decrease caregiver support; pt agreeable. Pt will benefit from skilled PT to increase their independence and safety with mobility to allow discharge to the venue listed below.       Follow Up Recommendations SNF;Supervision for mobility/OOB (pt wanting [facility])    Equipment Recommendations  None recommended by PT    Recommendations for Other Services       Precautions / Restrictions Precautions Precautions: Fall Restrictions Weight Bearing Restrictions: No      Mobility  Bed Mobility Overal bed mobility: Needs Assistance Bed Mobility: Supine to Sit     Supine to sit: Min assist     General bed mobility comments: MinA to assist trunk elevation and scoot hips to EOB;  Transfers Overall transfer level: Needs assistance Equipment used: Rolling walker (2 wheeled) Transfers: Sit to/from [facility] Sit to Stand: Mod assist Stand pivot transfers: Min assist       General transfer comment: posterior lean, modA to powerup into standing;minA for support with standpivot  Ambulation/Gait                Stairs            Wheelchair  Mobility    Modified Rankin (Stroke Patients Only)       Balance Overall balance assessment: Needs assistance Sitting-balance support: Bilateral upper extremity supported;Feet supported Sitting balance-Leahy Scale: Poor Sitting balance - Comments: intermittent posterior lean Postural control: Posterior lean Standing balance support: Single extremity supported;During functional activity Standing balance-Leahy Scale: Poor Standing balance comment: required S UE support progressing to BUE support,decreased activity tolerance required assistance to complete ADL while standing                             Pertinent Vitals/Pain Pain Assessment: No/denies pain    Home Living Family/patient expects to be discharged to:: Skilled nursing facility                 Additional Comments: Pt lives with her husband (who works) and 7 y.o. grandson    Prior Function Level of Independence: Needs Water engineer / Transfers Assistance Needed: ambulates and transfers with walker and assistance  ADL's / Homemaking Assistance Needed: granddaughter/husband/aide assists with all ADL's/IADL's.   Comments: Pt has aide 2x/week per chart review     Hand Dominance   Dominant Hand: Right    Extremity/Trunk Assessment   Upper Extremity Assessment Upper Extremity Assessment: Generalized weakness    Lower Extremity Assessment Lower Extremity Assessment: Generalized weakness    Cervical / Trunk Assessment Cervical / Trunk Assessment: Kyphotic  Communication   Communication: No difficulties  Cognition Arousal/Alertness: Awake/alert Behavior During Therapy: Flat affect  Overall Cognitive Status: No family/caregiver present to determine baseline cognitive functioning                                        General Comments General comments (skin integrity, edema, etc.): no family/caregiver present this session    Exercises     Assessment/Plan    PT  Assessment Patient needs continued PT services  PT Problem List Decreased strength;Decreased activity tolerance;Decreased balance;Decreased mobility;Decreased cognition;Decreased knowledge of precautions       PT Treatment Interventions DME instruction;Gait training;Stair training;Functional mobility training;Therapeutic activities;Therapeutic exercise;Balance training;Neuromuscular re-education;Cognitive remediation;Patient/family education    PT Goals (Current goals can be found in the Care Plan section)  Acute Rehab PT Goals Patient Stated Goal: Agreeable to rehab at SNF before return home PT Goal Formulation: With patient Time For Goal Achievement: 04/13/18 Potential to Achieve Goals: Fair    Frequency Min 2X/week   Barriers to discharge Decreased caregiver support      Co-evaluation   Reason for Co-Treatment: To address functional/ADL transfers;For patient/therapist safety   OT goals addressed during session: ADL's and self-care       AM-PAC PT "6 Clicks" Mobility  Outcome Measure Help needed turning from your back to your side while in a flat bed without using bedrails?: A Little Help needed moving from lying on your back to sitting on the side of a flat bed without using bedrails?: A Little Help needed moving to and from a bed to a chair (including a wheelchair)?: A Little Help needed standing up from a chair using your arms (e.g., wheelchair or bedside chair)?: A Lot Help needed to walk in hospital room?: A Lot Help needed climbing 3-5 steps with a railing? : Total 6 Click Score: 14    End of Session Equipment Utilized During Treatment: Gait belt Activity Tolerance: Patient tolerated treatment well Patient left: in chair;with call bell/phone within reach;with chair alarm set Nurse Communication: Mobility status PT Visit Diagnosis: Difficulty in walking, not elsewhere classified (R26.2);Muscle weakness (generalized) (M62.81);Unsteadiness on feet (R26.81)    Time:  1941-7408 PT Time Calculation (min) (ACUTE ONLY): 26 min   Charges:   PT Evaluation $PT Eval Moderate Complexity: 1 Mod        Ellamae Sia, [place], DPT Acute Rehabilitation Services Pager [phone] Office [phone]   Willy Eddy 03/30/2018, 8:49 AM

## 2018-03-30 NOTE — Care Management Obs Status (Signed)
[facility] NOTIFICATION   Patient Details  Name: [name] MRN: [id number] Date of Birth: October 25, 1940   Medicare Observation Status Notification Given:  Yes    Carles Collet, RN 03/30/2018, 3:52 PM

## 2018-03-30 NOTE — NC FL2 (Signed)
[place] LEVEL OF CARE SCREENING TOOL     IDENTIFICATION  Patient Name: [name] Birthdate: 11-Nov-1940 Sex: female Admission Date (Current Location): 03/29/2018  [facility] and [place] Number:  Herbalist and Address:  [facility], [place] [address], [place],  [address]      Provider Number: [id number]  Attending Physician Name and Address:  Monica Becton, MD  Relative Name and Phone Number:   Evgenia Merriman, spouse, [phone]    Current Level of Care: Hospital Recommended Level of Care: [place] Prior Approval Number:    Date Approved/Denied:   PASRR Number: [id number]  Discharge Plan: SNF    Current Diagnoses: Patient Active Problem List   Diagnosis Date Noted  . Dehydration 03/29/2018  . Failure to thrive in adult 03/29/2018  . Acute blood loss anemia   . Occult blood in stools   . Gastric AVM   . STEMI (ST elevation myocardial infarction) ([place]) 02/28/2018  . Acute inferior myocardial infarction ([place]) 02/28/2018  . Respiratory arrest ([place])   . Essential hypertension   . Respiratory failure ([place])   . SIRS (systemic inflammatory response syndrome) ([place]) 08/11/2016  . Nausea vomiting and diarrhea 08/11/2016  . Renal failure (ARF), acute on chronic (HCC) 08/11/2016  . High risk medications (not anticoagulants) long-term use 04/20/2016  . Bilateral chronic knee pain 04/20/2016  . Pain in left elbow 04/20/2016  . Chronic kidney disease, stage III (moderate) ([place]) 08/03/2015  . Encounter for general adult medical examination without abnormal findings 08/03/2015  . 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 Other long term (current) drug therapy 08/03/2015  . Pain in right shoulder 08/03/2015  . Postmenopausal bleeding 08/03/2015  . Vitamin D deficiency 08/03/2015  . Localized pain of right  shoulder joint 08/03/2015  . Primary hyperparathyroidism ([place]) 07/16/2015  . Hyperparathyroidism, primary ([place]) 07/14/2015  . Gait disturbance 03/24/2015  . Neck pain 03/24/2015  . Myalgia and myositis 03/24/2015  . Acute kidney injury superimposed on chronic kidney disease ([place]) 02/06/2015  . Leukocytosis 02/06/2015  . Seizure ([place]) 02/05/2015  . Acute encephalopathy 02/05/2015  . Abnormal MRI of head 02/02/2014  . Cognitive changes 02/02/2014  . Migraine without aura and without status migrainosus, not intractable 02/02/2014  . LOC (loss of consciousness) ([place])   . Syncope 01/12/2014  . Syncope and collapse 01/12/2014  . Hypertensive urgency 01/12/2014  . Rheumatoid arthritis ([place]) 01/12/2014  . Depression 01/12/2014  . Hyperlipidemia 01/12/2014    Orientation RESPIRATION BLADDER Height & Weight     Self, Time, Situation, Place  Normal Continent, External catheter Weight: 99 lb 6.8 oz (45.1 kg) Height:  5\' 1"  (154.9 cm)  BEHAVIORAL SYMPTOMS/MOOD NEUROLOGICAL BOWEL NUTRITION STATUS    Convulsions/Seizures(seizure disorder) Incontinent Diet(see discharge summary)  AMBULATORY STATUS COMMUNICATION OF NEEDS Skin   Extensive Assist Verbally Skin abrasions(skin tear on right elbow with foam)                       Personal Care Assistance Level of Assistance  Bathing, Dressing, Feeding Bathing Assistance: Maximum assistance Feeding assistance: Independent Dressing Assistance: Maximum assistance     Functional Limitations Info  Hearing, Sight, Speech Sight Info: Adequate Hearing Info: Adequate Speech Info: Adequate    SPECIAL CARE FACTORS FREQUENCY  PT (By licensed PT), OT (By licensed OT)     PT Frequency: 5x week OT  Frequency: 5x week            Contractures Contractures Info: Not present    Additional Factors Info  Code Status, Allergies, Psychotropic Code Status Info: Full Code Allergies Info: No Known Allergies Psychotropic Info: levETIRAcetam  (KEPPRA) tablet 1,000 mg daily PO         Current Medications (03/30/2018):  This is the current hospital active medication list Current Facility-Administered Medications  Medication Dose Route Frequency Provider Last Rate Last Dose  . acetaminophen (TYLENOL) tablet 650 mg  650 mg Oral Q6H PRN Karmen Bongo, MD       Or  . acetaminophen (TYLENOL) suppository 650 mg  650 mg Rectal Q6H PRN Karmen Bongo, MD      . atorvastatin (LIPITOR) tablet 80 mg  80 mg Oral q1800 Karmen Bongo, MD   80 mg at 03/29/18 2305  . carvedilol (COREG) tablet 12.5 mg  12.5 mg Oral BID WC Karmen Bongo, MD   12.5 mg at 03/30/18 0946  . docusate sodium (COLACE) capsule 100 mg  100 mg Oral BID Karmen Bongo, MD   100 mg at 03/30/18 0946  . enoxaparin (LOVENOX) injection 30 mg  30 mg Subcutaneous Q24H Karmen Bongo, MD   30 mg at 03/29/18 2109  . hydroxychloroquine (PLAQUENIL) tablet 200 mg  200 mg Oral Daily Karmen Bongo, MD   200 mg at 03/30/18 0946  . lactated ringers infusion   Intravenous Continuous Karmen Bongo, MD 100 mL/hr at 03/29/18 2113    . levETIRAcetam (KEPPRA) tablet 1,000 mg  1,000 mg Oral Daily Karmen Bongo, MD   1,000 mg at 03/30/18 0946  . ondansetron (ZOFRAN) tablet 4 mg  4 mg Oral Q6H PRN Karmen Bongo, MD       Or  . ondansetron [facility]) injection 4 mg  4 mg Intravenous Q6H PRN Karmen Bongo, MD      . pantoprazole (PROTONIX) EC tablet 40 mg  40 mg Oral Daily Karmen Bongo, MD   40 mg at 03/30/18 0945  . potassium chloride SA (K-DUR,KLOR-CON) CR tablet 40 mEq  40 mEq Oral Q4H Monica Becton, MD   40 mEq at 03/30/18 1304  . ticagrelor (BRILINTA) tablet 90 mg  90 mg Oral BID Karmen Bongo, MD   90 mg at 03/30/18 6270     Discharge Medications: Please see discharge summary for a list of discharge medications.  Relevant Imaging Results:  Relevant Lab Results:   Additional Information SSn: [place], [place]

## 2018-03-31 DIAGNOSIS — E872 Acidosis: Secondary | ICD-10-CM

## 2018-03-31 DIAGNOSIS — R627 Adult failure to thrive: Secondary | ICD-10-CM | POA: Diagnosis not present

## 2018-03-31 DIAGNOSIS — I10 Essential (primary) hypertension: Secondary | ICD-10-CM | POA: Diagnosis not present

## 2018-03-31 DIAGNOSIS — N183 Chronic kidney disease, stage 3 (moderate): Secondary | ICD-10-CM | POA: Diagnosis not present

## 2018-03-31 DIAGNOSIS — E860 Dehydration: Secondary | ICD-10-CM | POA: Diagnosis not present

## 2018-03-31 DIAGNOSIS — E43 Unspecified severe protein-calorie malnutrition: Secondary | ICD-10-CM

## 2018-03-31 DIAGNOSIS — E876 Hypokalemia: Secondary | ICD-10-CM

## 2018-03-31 LAB — BASIC METABOLIC PANEL
ANION GAP: 7 (ref 5–15)
BUN: 13 mg/dL (ref 8–23)
CHLORIDE: 119 mmol/L — AB (ref 98–111)
CO2: 16 mmol/L — ABNORMAL LOW (ref 22–32)
Calcium: 6.7 mg/dL — ABNORMAL LOW (ref 8.9–10.3)
Creatinine, Ser: 1.39 mg/dL — ABNORMAL HIGH (ref 0.44–1.00)
GFR calc Af Amer: 42 mL/min — ABNORMAL LOW (ref >60)
GFR calc non Af Amer: 36 mL/min — ABNORMAL LOW (ref >60)
Glucose, Bld: 161 mg/dL — ABNORMAL HIGH (ref 70–99)
POTASSIUM: 3.3 mmol/L — AB (ref 3.5–5.1)
Sodium: 142 mmol/L (ref 135–145)

## 2018-03-31 LAB — MAGNESIUM: Magnesium: 1 mg/dL — ABNORMAL LOW (ref 1.7–2.4)

## 2018-03-31 LAB — PHOSPHORUS: Phosphorus: 1.5 mg/dL — ABNORMAL LOW (ref 2.5–4.6)

## 2018-03-31 LAB — CBC
HCT: 23.7 % — ABNORMAL LOW (ref 36.0–46.0)
HEMOGLOBIN: 7.7 g/dL — AB (ref 12.0–15.0)
MCH: 26 pg (ref 26.0–34.0)
MCHC: 32.5 g/dL (ref 30.0–36.0)
MCV: 80.1 fL (ref 80.0–100.0)
Platelets: 359 10*3/uL (ref 150–400)
RBC: 2.96 MIL/uL — AB (ref 3.87–5.11)
RDW: 17.2 % — ABNORMAL HIGH (ref 11.5–15.5)
WBC: 12.8 10*3/uL — ABNORMAL HIGH (ref 4.0–10.5)
nRBC: 0 % (ref 0.0–0.2)

## 2018-03-31 MED ORDER — K PHOS MONO-SOD PHOS DI & MONO 155-852-130 MG PO TABS
500.0000 mg | ORAL_TABLET | Freq: Two times a day (BID) | ORAL | Status: AC
  Administered 2018-03-31 (×2): 500 mg via ORAL
  Filled 2018-03-31 (×2): qty 2

## 2018-03-31 MED ORDER — CALCIUM GLUCONATE-NACL 1-0.675 GM/50ML-% IV SOLN
1.0000 g | Freq: Once | INTRAVENOUS | Status: AC
  Administered 2018-03-31: 1000 mg via INTRAVENOUS
  Filled 2018-03-31: qty 50

## 2018-03-31 MED ORDER — MAGNESIUM SULFATE 4 GM/100ML IV SOLN
4.0000 g | Freq: Once | INTRAVENOUS | Status: AC
  Administered 2018-03-31: 4 g via INTRAVENOUS
  Filled 2018-03-31: qty 100

## 2018-03-31 MED ORDER — POTASSIUM CHLORIDE CRYS ER 20 MEQ PO TBCR
40.0000 meq | EXTENDED_RELEASE_TABLET | ORAL | Status: AC
  Administered 2018-03-31 (×2): 40 meq via ORAL
  Filled 2018-03-31 (×2): qty 2

## 2018-03-31 MED ORDER — SODIUM BICARBONATE 8.4 % IV SOLN
INTRAVENOUS | Status: AC
  Administered 2018-03-31 – 2018-04-01 (×2): via INTRAVENOUS
  Filled 2018-03-31 (×3): qty 150

## 2018-03-31 NOTE — Progress Notes (Signed)
PROGRESS NOTE    [name]  NWG:[id number] DOB: 28-Nov-1940 DOA: 03/29/2018 PCP: Glendale Chard, MD   Brief Narrative:  HPI per Dr Karmen Bongo on 03/28/2013 [name] is a 78 y.o. female with medical history significant of seizures; RA; HTN; HLD; and CKD presenting with SOB.  The patient had a stent placed about a month ago.  Since then, she is just laying in bed every day.  She has an in-home nurse twice a week.  The nurse was concerned about SOB and sent her in by ambulance.  She is not currently SOB but has been for about 2-3 days.  Slight cough, nonproductive.  No sick contacts.  She intermittently does not eat/drink as much as usual.  No GI symptoms.  With her RA, she is very limited with her mobility - she needs assistance with ambulation.  They were not in favor of SNF admission last time, but they would consider it now - vs. Home health for 5 hours a day, which is pending with insurance now.  Interim History She was complaining of weakness and appeared dehydrated.  Electrolytes were severely off and they were replete. PT/OT evaluated and recommending SNF and last admission family refused for SNF but they are currently agreeable.  Social work will obtain authorization for Monday for SNF placement.  Currently treating all her abnormal lab values and rehydrating currently.  Assessment & Plan:   Principal Problem:   Dehydration Active Problems:   Chronic kidney disease, stage III (moderate) (HCC)   Essential hypertension   Failure to thrive in adult   Protein-calorie malnutrition, severe  Failure to Thrive -Continue with calorie count with Dietician -Continue with high-protein diet -No signs of infection is noted on urinalysis or chest x-ray. -Patient is afebrile.   -Mild elevation of the WBC count most likely from dehydration and trending down -Replete all Electrolytes as Necessary as belowe  Debility -Patient will benefit going to skilled nursing  facility -Discussed with the social worker, will obtain authorization on Monday for SNF placement.  Anemia Chronic blood loss/Anemia of chronic kidney disease/Normocytic Anemia -Patient had hemoglobin of 8.4 at the time of the discharge last hospitalization and now repeat Hb on Admission was 8.9 -Hb/Hct went from 8.9/29.1 -> 8.1/26.1 -> and 7.7/23.7 and ? Dilutional Drop -Check Anemia Panel in the AM -Continue to Monitor for S/Sx of Bleeding  Leukocytosis -In the setting of Dehydration. Unlikely has infection currently as she is afebrile -WBC went from 16.7 -> 13.7 -> 12.8  -Continue to Monitor for S/Sx of Infection and hold Abx at this time -Repeat CMP in AM   Dehydration -Patient had elevated sodium at 149 and this is improved to 142, was given IV fluids overnight with LR but this has now stopped and changed to Sodium Bicarbonate as belwo -Continue to Monitor Volume Status Carefully  CKD Stage 3 -Patient had an AKI on CKD last admission -BUN/Cr went from 17/1.39 -> 15/1.22 and is now 13/1.39 -Avoid Nephrotoxic Medications, Contrast Dyes and Hypotension -Continue to Monitor and Trend Renal Function and repeat CMP in AM   Recent history of GI bleed -EGD showed AVMs, clipped -Continue with the Pantoprazole 40 mg po Daily   CAD S/P stent placement -Continue Ticagrelor 90 mg po BID, Atorvastatin 80 mg po qHS and Carvedilol 12.5 mg po BID   Hypertension -C/w Carvedilol 12.5 mg po BID  Seizure Disorder -Continue with Levetiracetam 1000 mg po Daily  -Check Keppra Level while hospitalized   Rheumatoid  Arthritis -Continue with the Hydroxychloroquine 200 mg po Daily   Hyperlipidemia -Continue with Atorvastatin 80 mg po qHS  Severe Malnutrition in the Context of Acute Illness/Injury -Nutritionist Consulted for further evaluation and recommendations -C/w Boost Plus 237 mL BID  Hypomagnesemia -Paitent's Mag Level was 1.0 -Replete with IV Mag Sulfate 4 grams -Continue  to Monitor and Replete as Necessary -Repeat Mag Level in AM   Hypokalemia -Patient's K+ was 3.3 -Replete with po KCl 40 mEQ BID x2 as well as po K Phos Neutral 500 mg BID x2 -Continue to Monitor and Replete as Necessary -Repeat CMP in AM  Hypophosphatemia -Patient's Phos Level this AM was 1.5 -Replete with po K Phos Neutral 500 mg BID x2 -Continue to Monitor and Replete as Necessary -Repeat Phos Level in the AM   Hypocalcemia -Patient's Ca2+ was 6.7 -Check Ionized Ca2+ -No Albumin was ordered so could not calculate corrected Ca2+ today but will check Albumin in AM -Replete with IV Calcium Gluconate 1 gram -Continue to Mnonitor and Replete A NEcesary  Non-Gap Metabolic Acidosis -CO2 was 16 and AG was 7; In the setting of Hyperchloremia  -Stopped LR and started Sodium Bicarbonate 150 mEQ in D5W at 75 mL/hr x1 Day -Continue to Monitor and Repeat CMP in AM   DVT prophylaxis: Enoxaparin 30 mg sq  Code Status: FULL CODE Family Communication: No family present at bedside  Disposition Plan: SNF when medically stable  Consultants:   None   Procedures: None  Antimicrobials:  Anti-infectives (From admission, onward)   Start     Dose/Rate Route Frequency Ordered Stop   03/30/18 1000  hydroxychloroquine (PLAQUENIL) tablet 200 mg     200 mg Oral Daily 03/29/18 1730       Subjective: Seen and examined at bedside and was feeling weak.  Denying chest pain, lightheadedness or dizziness.  No nausea or vomiting but has not been eating very well.  No other concerns or complaints at this time  Objective: Vitals:   03/30/18 0738 03/30/18 1651 03/31/18 0100 03/31/18 0732  BP: 127/66 (!) 113/59 110/65 133/67  Pulse: 76 70 79 76  Resp: 16 17 18 16   Temp: 98.2 F (36.8 C) 98 F (36.7 C) 98.7 F (37.1 C) 98.4 F (36.9 C)  TempSrc: Oral Oral Oral Oral  SpO2: 96% 100% 100% 99%  Weight:      Height:        Intake/Output Summary (Last 24 hours) at 03/31/2018 1310 Last data filed at  03/31/2018 1031 Gross per 24 hour  Intake 340 ml  Output 400 ml  Net -60 ml   Filed Weights   03/29/18 1113 03/29/18 1846  Weight: 49.1 kg 45.1 kg   Examination: Physical Exam:  Constitutional: Thin AAF in NAD and appears calm but uncomfortable Eyes: Lids and conjunctivae normal, sclerae anicteric  ENMT: External Ears, Nose appear normal. Grossly normal hearing. Mucous membranes are slightly dry Neck: Appears normal, supple, no cervical masses, normal ROM, no appreciable thyromegaly; no JVD Respiratory: Diminished to auscultation bilaterally, no wheezing, rales, rhonchi or crackles. Normal respiratory effort and patient is not tachypenic. No accessory muscle use.  Cardiovascular: RRR, no murmurs / rubs / gallops. S1 and S2 auscultated. Trace extremity edema.  Abdomen: Soft, non-tender, non-distended. No masses palpated. No appreciable hepatosplenomegaly. Bowel sounds positive x4.  GU: Deferred. Musculoskeletal: No clubbing / cyanosis of digits/nails. No joint deformity upper and lower extremities.  Skin: No rashes, lesions, ulcers on a limited skin evaluation. No induration; Warm and dry.  Neurologic: CN 2-12 grossly intact with no focal deficits.  Romberg sign and cerebellar reflexes not assessed.  Psychiatric: Normal judgment and insight. Alert and awake. Depressed appearing mood and Flat affect.   Data Reviewed: I have personally reviewed following labs and imaging studies  CBC: Recent Labs  Lab 03/29/18 1148 03/30/18 0228 03/31/18 0231  WBC 16.7* 13.7* 12.8*  NEUTROABS 13.8*  --   --   HGB 8.9* 8.1* 7.7*  HCT 29.1* 26.1* 23.7*  MCV 82.7 80.6 80.1  PLT 448* 378 546   Basic Metabolic Panel: Recent Labs  Lab 03/29/18 1148 03/30/18 0228 03/31/18 0231  NA 149* 145 142  K 3.8 3.1* 3.3*  CL 120* 119* 119*  CO2 16* 16* 16*  GLUCOSE 102* 89 161*  BUN 17 15 13   CREATININE 1.39* 1.22* 1.39*  CALCIUM 7.3* 7.1* 6.7*  MG  --   --  1.0*  PHOS  --   --  1.5*    GFR: Estimated Creatinine Clearance: 23.4 mL/min (A) (by C-G formula based on SCr of 1.39 mg/dL (H)). Liver Function Tests: Recent Labs  Lab 03/29/18 1148  AST 18  ALT 8  ALKPHOS 89  BILITOT 1.1  PROT 7.3  ALBUMIN 2.2*   Recent Labs  Lab 03/29/18 1148  LIPASE 42   No results for input(s): AMMONIA in the last 168 hours. Coagulation Profile: No results for input(s): INR, PROTIME in the last 168 hours. Cardiac Enzymes: No results for input(s): CKTOTAL, CKMB, CKMBINDEX, TROPONINI in the last 168 hours. BNP (last 3 results) No results for input(s): PROBNP in the last 8760 hours. HbA1C: No results for input(s): HGBA1C in the last 72 hours. CBG: No results for input(s): GLUCAP in the last 168 hours. Lipid Profile: No results for input(s): CHOL, HDL, LDLCALC, TRIG, CHOLHDL, LDLDIRECT in the last 72 hours. Thyroid Function Tests: No results for input(s): TSH, T4TOTAL, FREET4, T3FREE, THYROIDAB in the last 72 hours. Anemia Panel: No results for input(s): VITAMINB12, FOLATE, FERRITIN, TIBC, IRON, RETICCTPCT in the last 72 hours. Sepsis Labs: No results for input(s): PROCALCITON, LATICACIDVEN in the last 168 hours.  Recent Results (from the past 240 hour(s))  Urine culture     Status: None   Collection Time: 03/29/18  1:33 PM  Result Value Ref Range Status   Specimen Description URINE, CATHETERIZED  Final   Special Requests NONE  Final   Culture   Final    NO GROWTH Performed at [facility], [address] [place], [place] [address]    Report Status 03/30/2018 FINAL  Final      Estimated body mass index is 18.79 kg/m as calculated from the following:   Height as of this encounter: 5\' 1"  (1.549 m).   Weight as of this encounter: 45.1 kg.  Malnutrition Type:  Nutrition Problem: Severe Malnutrition Etiology: acute illness, poor appetite(Deconditioning following prior hospitilization)   Malnutrition Characteristics:  Signs/Symptoms: percent weight loss,  moderate muscle depletion   Nutrition Interventions:    Radiology Studies: Ct Renal Stone Study  Result Date: 03/29/2018 CLINICAL DATA:  Right flank pain and dysuria since yesterday. History of renal calculi. 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 EXAM: CT ABDOMEN AND PELVIS WITHOUT CONTRAST TECHNIQUE: Multidetector CT imaging of the abdomen and pelvis was performed following the standard protocol without IV contrast. COMPARISON:  Abdomen and pelvis radiograph dated 03/02/2018 and abdomen and pelvis CT dated 02/08/2009. FINDINGS:  Lower chest: Small bilateral pleural effusions, larger on the right. Minimal bilateral dependent atelectasis. Minimal pericardial effusion with a maximum thickness of 3 mm. Hepatobiliary: No focal liver abnormality is seen. Status post cholecystectomy. No biliary dilatation. Pancreas: Unremarkable. No pancreatic ductal dilatation or surrounding inflammatory changes. Spleen: Normal in size without focal abnormality. Adrenals/Urinary Tract: Normal appearing adrenal glands. 2.5 cm left renal cyst. Somewhat small right kidney. Unremarkable ureters and urinary bladder. No urinary tract calculi or hydronephrosis. Stomach/Bowel: 2 metallic clips in the stomach. Similar-appearing metallic clip in the rectum posteriorly on the right, within a small amount of stool. Unremarkable small bowel and appendix. Vascular/Lymphatic: Atheromatous arterial calcifications without aneurysm. No enlarged lymph nodes. Reproductive: Status post hysterectomy. No adnexal masses. Other: Midline surgical scar. Musculoskeletal: Minimal lumbar and lower thoracic spine degenerative changes. IMPRESSION: 1. No urinary tract calculi or hydronephrosis. 2. Small bilateral pleural effusions, larger on the right. 3. Minimal pericardial effusion. 4. Mild to moderate right renal atrophy. 5. Two the previously placed gastric  clips are in the stomach and one is in the rectum within a small amount of stool. Electronically Signed   By: Claudie Revering M.D.   On: 03/29/2018 14:20   Scheduled Meds:  atorvastatin  80 mg Oral q1800   carvedilol  12.5 mg Oral BID WC   docusate sodium  100 mg Oral BID   enoxaparin (LOVENOX) injection  30 mg Subcutaneous Q24H   hydroxychloroquine  200 mg Oral Daily   lactose free nutrition  237 mL Oral BID BM   levETIRAcetam  1,000 mg Oral Daily   pantoprazole  40 mg Oral Daily   potassium chloride  40 mEq Oral Q4H   ticagrelor  90 mg Oral BID   Continuous Infusions:  lactated ringers 100 mL/hr at 03/31/18 0340    sodium bicarbonate  infusion 1000 mL 75 mL/hr at 03/31/18 0914    LOS: 0 days   Kerney Elbe, DO [facility] PAGER is on [place]  If 7PM-7AM, please contact night-coverage [email or web address] Password [email or web address] 03/31/2018, 1:10 PM

## 2018-04-01 DIAGNOSIS — E876 Hypokalemia: Secondary | ICD-10-CM | POA: Diagnosis present

## 2018-04-01 DIAGNOSIS — N183 Chronic kidney disease, stage 3 (moderate): Secondary | ICD-10-CM | POA: Diagnosis not present

## 2018-04-01 DIAGNOSIS — I252 Old myocardial infarction: Secondary | ICD-10-CM | POA: Diagnosis not present

## 2018-04-01 DIAGNOSIS — E872 Acidosis: Secondary | ICD-10-CM | POA: Diagnosis present

## 2018-04-01 DIAGNOSIS — Z7902 Long term (current) use of antithrombotics/antiplatelets: Secondary | ICD-10-CM | POA: Diagnosis not present

## 2018-04-01 DIAGNOSIS — I129 Hypertensive chronic kidney disease with stage 1 through stage 4 chronic kidney disease, or unspecified chronic kidney disease: Secondary | ICD-10-CM | POA: Diagnosis present

## 2018-04-01 DIAGNOSIS — I2510 Atherosclerotic heart disease of native coronary artery without angina pectoris: Secondary | ICD-10-CM | POA: Diagnosis present

## 2018-04-01 DIAGNOSIS — R627 Adult failure to thrive: Secondary | ICD-10-CM | POA: Diagnosis not present

## 2018-04-01 DIAGNOSIS — R64 Cachexia: Secondary | ICD-10-CM | POA: Diagnosis present

## 2018-04-01 DIAGNOSIS — G40909 Epilepsy, unspecified, not intractable, without status epilepticus: Secondary | ICD-10-CM | POA: Diagnosis present

## 2018-04-01 DIAGNOSIS — E870 Hyperosmolality and hypernatremia: Secondary | ICD-10-CM | POA: Diagnosis present

## 2018-04-01 DIAGNOSIS — E43 Unspecified severe protein-calorie malnutrition: Secondary | ICD-10-CM | POA: Diagnosis present

## 2018-04-01 DIAGNOSIS — Z681 Body mass index (BMI) 19 or less, adult: Secondary | ICD-10-CM | POA: Diagnosis not present

## 2018-04-01 DIAGNOSIS — E785 Hyperlipidemia, unspecified: Secondary | ICD-10-CM | POA: Diagnosis present

## 2018-04-01 DIAGNOSIS — E892 Postprocedural hypoparathyroidism: Secondary | ICD-10-CM | POA: Diagnosis present

## 2018-04-01 DIAGNOSIS — N179 Acute kidney failure, unspecified: Secondary | ICD-10-CM | POA: Diagnosis present

## 2018-04-01 DIAGNOSIS — E878 Other disorders of electrolyte and fluid balance, not elsewhere classified: Secondary | ICD-10-CM | POA: Diagnosis present

## 2018-04-01 DIAGNOSIS — I10 Essential (primary) hypertension: Secondary | ICD-10-CM | POA: Diagnosis not present

## 2018-04-01 DIAGNOSIS — E860 Dehydration: Secondary | ICD-10-CM | POA: Diagnosis not present

## 2018-04-01 DIAGNOSIS — M069 Rheumatoid arthritis, unspecified: Secondary | ICD-10-CM | POA: Diagnosis present

## 2018-04-01 DIAGNOSIS — D631 Anemia in chronic kidney disease: Secondary | ICD-10-CM | POA: Diagnosis present

## 2018-04-01 DIAGNOSIS — Z79899 Other long term (current) drug therapy: Secondary | ICD-10-CM | POA: Diagnosis not present

## 2018-04-01 LAB — CBC WITH DIFFERENTIAL/PLATELET
Abs Immature Granulocytes: 0.24 10*3/uL — ABNORMAL HIGH (ref 0.00–0.07)
Basophils Absolute: 0 10*3/uL (ref 0.0–0.1)
Basophils Relative: 0 %
EOS ABS: 0.2 10*3/uL (ref 0.0–0.5)
Eosinophils Relative: 2 %
HCT: 28.1 % — ABNORMAL LOW (ref 36.0–46.0)
Hemoglobin: 9.1 g/dL — ABNORMAL LOW (ref 12.0–15.0)
IMMATURE GRANULOCYTES: 2 %
Lymphocytes Relative: 14 %
Lymphs Abs: 1.7 10*3/uL (ref 0.7–4.0)
MCH: 25.8 pg — ABNORMAL LOW (ref 26.0–34.0)
MCHC: 32.4 g/dL (ref 30.0–36.0)
MCV: 79.6 fL — ABNORMAL LOW (ref 80.0–100.0)
Monocytes Absolute: 1.2 10*3/uL — ABNORMAL HIGH (ref 0.1–1.0)
Monocytes Relative: 9 %
NEUTROS PCT: 73 %
NRBC: 0 % (ref 0.0–0.2)
Neutro Abs: 9 10*3/uL — ABNORMAL HIGH (ref 1.7–7.7)
PLATELETS: 268 10*3/uL (ref 150–400)
RBC: 3.53 MIL/uL — ABNORMAL LOW (ref 3.87–5.11)
RDW: 17.7 % — ABNORMAL HIGH (ref 11.5–15.5)
WBC: 12.3 10*3/uL — ABNORMAL HIGH (ref 4.0–10.5)

## 2018-04-01 LAB — COMPREHENSIVE METABOLIC PANEL
ALK PHOS: 80 U/L (ref 38–126)
ALT: 7 U/L (ref 0–44)
AST: 14 U/L — ABNORMAL LOW (ref 15–41)
Albumin: 1.7 g/dL — ABNORMAL LOW (ref 3.5–5.0)
Anion gap: 8 (ref 5–15)
BUN: 10 mg/dL (ref 8–23)
CO2: 19 mmol/L — ABNORMAL LOW (ref 22–32)
Calcium: 7.2 mg/dL — ABNORMAL LOW (ref 8.9–10.3)
Chloride: 114 mmol/L — ABNORMAL HIGH (ref 98–111)
Creatinine, Ser: 1.37 mg/dL — ABNORMAL HIGH (ref 0.44–1.00)
GFR calc Af Amer: 42 mL/min — ABNORMAL LOW (ref >60)
GFR calc non Af Amer: 37 mL/min — ABNORMAL LOW (ref >60)
Glucose, Bld: 103 mg/dL — ABNORMAL HIGH (ref 70–99)
Potassium: 4.3 mmol/L (ref 3.5–5.1)
SODIUM: 141 mmol/L (ref 135–145)
Total Bilirubin: 0.4 mg/dL (ref 0.3–1.2)
Total Protein: 6.2 g/dL — ABNORMAL LOW (ref 6.5–8.1)

## 2018-04-01 LAB — FERRITIN: FERRITIN: 224 ng/mL (ref 11–307)

## 2018-04-01 LAB — IRON AND TIBC
Iron: 9 ug/dL — ABNORMAL LOW (ref 28–170)
Saturation Ratios: 7 % — ABNORMAL LOW (ref 10.4–31.8)
TIBC: 123 ug/dL — ABNORMAL LOW (ref 250–450)
UIBC: 114 ug/dL

## 2018-04-01 LAB — FOLATE: FOLATE: 3.7 ng/mL — AB (ref >5.9)

## 2018-04-01 LAB — TSH: TSH: 0.571 u[IU]/mL (ref 0.350–4.500)

## 2018-04-01 LAB — RETICULOCYTES
Immature Retic Fract: 41.9 % — ABNORMAL HIGH (ref 2.3–15.9)
RBC.: 3.53 MIL/uL — ABNORMAL LOW (ref 3.87–5.11)
Retic Count, Absolute: 37.9 10*3/uL (ref 19.0–186.0)
Retic Ct Pct: 1.5 % (ref 0.4–3.1)

## 2018-04-01 LAB — PHOSPHORUS: PHOSPHORUS: 3.2 mg/dL (ref 2.5–4.6)

## 2018-04-01 LAB — MAGNESIUM: Magnesium: 2 mg/dL (ref 1.7–2.4)

## 2018-04-01 LAB — VITAMIN B12: Vitamin B-12: 670 pg/mL (ref 180–914)

## 2018-04-01 MED ORDER — POLYSACCHARIDE IRON COMPLEX 150 MG PO CAPS
150.0000 mg | ORAL_CAPSULE | Freq: Every day | ORAL | Status: DC
  Administered 2018-04-01 – 2018-04-03 (×3): 150 mg via ORAL
  Filled 2018-04-01 (×3): qty 1

## 2018-04-01 MED ORDER — PREDNISONE 5 MG PO TABS: 5.0000 mg | ORAL_TABLET | Freq: Every day | ORAL | 0 refills | Status: DC

## 2018-04-01 MED ORDER — CALCIUM GLUCONATE-NACL 1-0.675 GM/50ML-% IV SOLN
1.0000 g | Freq: Once | INTRAVENOUS | Status: AC
  Administered 2018-04-01: 1000 mg via INTRAVENOUS
  Filled 2018-04-01: qty 50

## 2018-04-01 MED ORDER — VITAMIN D 25 MCG (1000 UNIT) PO TABS
5000.0000 [IU] | ORAL_TABLET | Freq: Every day | ORAL | Status: DC
  Administered 2018-04-01 – 2018-04-03 (×3): 5000 [IU] via ORAL
  Filled 2018-04-01 (×3): qty 5

## 2018-04-01 NOTE — Progress Notes (Signed)
Physical Therapy Treatment Patient Details Name: [name] MRN: [id number] DOB: [birth year] Today's Date: 04/01/2018    History of Present Illness Pt is a 78 y.o. F with significant PMH of seizures, RA, and CKD presenting with SOB. Had stent placed about a month ago and has been mostly sedentary. Admitted with dehydration and failure to thrive.     PT Comments    Patient with poor progress this visit, requiring Max A to stand with poor stability, prematurely sitting back onto bed several times. Cont to rec SNF.    Follow Up Recommendations  SNF;Supervision for mobility/OOB     Equipment Recommendations  None recommended by PT    Recommendations for Other Services       Precautions / Restrictions Precautions Precautions: Fall Precaution Comments: Urine incontinence, orthostatic hypotension Restrictions Weight Bearing Restrictions: No    Mobility  Bed Mobility Overal bed mobility: Needs Assistance Bed Mobility: Supine to Sit     Supine to sit: Mod assist     General bed mobility comments: mod A to bring trunk and legs to EOB  Transfers Overall transfer level: Needs assistance Equipment used: Rolling walker (2 wheeled) Transfers: Sit to/from Stand Sit to Stand: Max assist         General transfer comment: max A today to stand patient unable to stabilize in RW, x4 attempts with premature sits each time  Ambulation/Gait                 Stairs             Wheelchair Mobility    Modified Rankin (Stroke Patients Only)       Balance                                            Cognition Arousal/Alertness: Awake/alert Behavior During Therapy: Flat affect Overall Cognitive Status: No family/caregiver present to determine baseline cognitive functioning Area of Impairment: Following commands;Problem solving                               General Comments: slow processing. unsure what baselinse is.        Exercises      General Comments        Pertinent Vitals/Pain Pain Assessment: No/denies pain    Home Living                      Prior Function            PT Goals (current goals can now be found in the care plan section) Acute Rehab PT Goals Patient Stated Goal: non stated PT Goal Formulation: With patient Time For Goal Achievement: 04/13/18 Potential to Achieve Goals: Fair Progress towards PT goals: Progressing toward goals    Frequency    Min 2X/week      PT Plan Current plan remains appropriate    Co-evaluation              AM-PAC PT "6 Clicks" Mobility   Outcome Measure  Help needed turning from your back to your side while in a flat bed without using bedrails?: A Little Help needed moving from lying on your back to sitting on the side of a flat bed without using bedrails?: A Little Help needed moving to and from a bed to  a chair (including a wheelchair)?: A Little Help needed standing up from a chair using your arms (e.g., wheelchair or bedside chair)?: A Lot Help needed to walk in hospital room?: A Lot Help needed climbing 3-5 steps with a railing? : Total 6 Click Score: 14    End of Session Equipment Utilized During Treatment: Gait belt Activity Tolerance: Patient tolerated treatment well Patient left: in bed;with bed alarm set;with call bell/phone within reach Nurse Communication: Mobility status PT Visit Diagnosis: Difficulty in walking, not elsewhere classified (R26.2);Muscle weakness (generalized) (M62.81);Unsteadiness on feet (R26.81)     Time: 2482-5003 PT Time Calculation (min) (ACUTE ONLY): 15 min  Charges:  $Therapeutic Activity: 8-22 mins                     Reinaldo Berber, PT, DPT Acute Rehabilitation Services Pager: [phone] Office: [phone]     Reinaldo Berber 04/01/2018, 10:11 PM

## 2018-04-01 NOTE — Telephone Encounter (Signed)
Spoke with [name] and he states that [name] was previously on Humira about 1 year ago. Patient had to stop due to the medication getting so expensive. Patient is having issues to where she can't hardy walk. Patient's husband states [name]'s "joints are beginning to draw. " Patient's husband would like to know about discussing getting the patient back on Humira and if there are any patient's assistance to help with the cost. Patient is currently admitted to the hospital. Please advise.

## 2018-04-01 NOTE — Progress Notes (Signed)
PROGRESS NOTE    [name]  NLZ:[id number] DOB: 08-09-1940 DOA: 03/29/2018 PCP: Glendale Chard, MD   Brief Narrative:  HPI per Dr Karmen Bongo on 03/28/2013 [name] is a 78 y.o. female with medical history significant of seizures; RA; HTN; HLD; and CKD presenting with SOB.  The patient had a stent placed about a month ago.  Since then, she is just laying in bed every day.  She has an in-home nurse twice a week.  The nurse was concerned about SOB and sent her in by ambulance.  She is not currently SOB but has been for about 2-3 days.  Slight cough, nonproductive.  No sick contacts.  She intermittently does not eat/drink as much as usual.  No GI symptoms.  With her RA, she is very limited with her mobility - she needs assistance with ambulation.  They were not in favor of SNF admission last time, but they would consider it now - vs. Home health for 5 hours a day, which is pending with insurance now.  Interim History She was complaining of weakness and appeared dehydrated.  Electrolytes were severely off and they were replete. PT/OT evaluated and recommending SNF and last admission family refused for SNF but they are currently agreeable.  Social work will obtain authorization for Monday for SNF placement.  Currently treating all her abnormal lab values and rehydrating currently and is improving.   Assessment & Plan:   Principal Problem:   Dehydration Active Problems:   Chronic kidney disease, stage III (moderate) (HCC)   Essential hypertension   Failure to thrive in adult   Protein-calorie malnutrition, severe  Failure to Thrive -Continued with calorie count with Dietician and now D/C'd -Continue with high-protein diet -No signs of infection is noted on urinalysis or chest x-ray. -Patient is afebrile.   -Mild elevation of the WBC count most likely from dehydration and trending down from 13.7 -> 12.8 -> 12.3 -Replete all Electrolytes as Necessary as below  Debility -Patient  will benefit going to skilled nursing facility -Discussed with the social worker, will obtain authorization on Monday for SNF placement.  Anemia Chronic blood loss/Anemia of chronic kidney disease/Normocytic Anemia -Patient had hemoglobin of 8.4 at the time of the discharge last hospitalization and now repeat Hb on Admission was 8.9 -Hb/Hct went from 8.9/29.1 -> 8.1/26.1 -> and 7.7/23.7 and ? Dilutional Drop; Now Hgb/Hct is 9.1/28.1 -Checked Anemia Panel and showed an iron level of 9, U IBC of 114, TIBC of 123, saturation ratios of 7%, ferritin level of 224, folate of 2.7, vitamin B12 670 -Continue to Monitor for S/Sx of Bleeding  Leukocytosis -In the setting of Dehydration. Unlikely has infection currently as she is afebrile -WBC went from 16.7 -> 13.7 -> 12.8 -> 12.3 and trending down -Continue to Monitor for S/Sx of Infection and hold Abx at this time -Repeat CMP in AM   Dehydration -Patient had elevated sodium at 149 and this is improved to 141, was given IV fluids overnight with LR but this has now stopped and changed to Sodium Bicarbonate as below -Continue to Monitor Volume Status Carefully  CKD Stage 3 -Patient had an AKI on CKD last admission -BUN/Cr went from 17/1.39 -> 15/1.22 and is now 10/1.37 -Avoid Nephrotoxic Medications, Contrast Dyes and Hypotension -Continue to Monitor and Trend Renal Function and repeat CMP in AM   Recent history of GI bleed -EGD showed AVMs, clipped -Continue with the Pantoprazole 40 mg po Daily   CAD S/P stent  placement -Continue Ticagrelor 90 mg po BID, Atorvastatin 80 mg po qHS and Carvedilol 12.5 mg po BID   Hypertension -C/w Carvedilol 12.5 mg po BID  Seizure Disorder -Continue with Levetiracetam 1000 mg po Daily  -Check Keppra Level while hospitalized   Rheumatoid Arthritis -Continue with the Hydroxychloroquine 200 mg po Daily -Was previously on Humira and stopped due to cost -Follow up with Rheumatology Dr. Estanislado Pandy in  Rheumatology as she has called in a Low dose Prescription to the patients Pharmacy    Hyperlipidemia -Continue with Atorvastatin 80 mg po qHS  Severe Malnutrition in the Context of Acute Illness/Injury -Nutritionist Consulted for further evaluation and recommendations -C/w Boost Plus 237 mL BID  Hypomagnesemia -Paitent's Mag Level was 1.0 improved to 2.0 -Replete with IV Mag Sulfate 4 grams yesterday -Continue to Monitor and Replete as Necessary -Repeat Mag Level in AM   Hypokalemia -Patient's K+ was 3.3 and improved to 4.3 -Replete with po KCl 40 mEQ BID x2 as well as po K Phos Neutral 500 mg BID x2 yesterday -Continue to Monitor and Replete as Necessary -Repeat CMP in AM  Hypophosphatemia -Patient's Phos Level was 1.5 and improved to 3.2 -Replete with po K Phos Neutral 500 mg BID x2 today -Continue to Monitor and Replete as Necessary -Repeat Phos Level in the AM   Hypocalcemia -Patient's Ca2+ was 6.7 and improved to 7.2 -Check Ionized Ca2+ was not done -Calcium was 9.0 -Replete with IV Calcium Gluconate 1 gram again today -Continue to Mnonitor and Replete A NEcesary  Non-Gap Metabolic Acidosis -CO2 was 16 and AG was 7; In the setting of Hyperchloremia  -Now is improving and CO2 is 19 and anion gap is 8.  Chloride levels 114 now -Stopped LR and started Sodium Bicarbonate 150 mEQ in D5W at 75 mL/hr x1 Day to end today -Continue to Monitor and Repeat CMP in AM   DVT prophylaxis: Enoxaparin 30 mg sq  Code Status: FULL CODE Family Communication: Discussed with husband at bedside Disposition Plan: SNF when medically stable anticipating this will be tomorrow if insurance authorization has been obtained  Consultants:   None   Procedures: None  Antimicrobials:  Anti-infectives (From admission, onward)   Start     Dose/Rate Route Frequency Ordered Stop   03/30/18 1000  hydroxychloroquine (PLAQUENIL) tablet 200 mg     200 mg Oral Daily 03/29/18 1730        Subjective: Seen and examined at bedside and that she was eating better.  Husband states that she is eating more now.  No nausea or vomiting.  Still feels a little weak.  Husband was concerned about her joints starting to "draw".  She will follow-up with rheumatology in the outpatient setting and rheumatologist has called in a p.o. prednisone prescription at discharge.  Chest pain, lightheadedness or dizziness.  No other concerns or complaints at this time.  Objective: Vitals:   03/31/18 1647 03/31/18 2011 04/01/18 0300 04/01/18 0824  BP: 117/60 (!) 142/76 125/65 (!) 143/72  Pulse: 83 86 80 74  Resp: 18  16   Temp: 97.8 F (36.6 C) 98.7 F (37.1 C) 98.6 F (37 C) 98.3 F (36.8 C)  TempSrc: Oral Oral Oral Oral  SpO2: 95% 99% 98% 98%  Weight:      Height:        Intake/Output Summary (Last 24 hours) at 04/01/2018 1528 Last data filed at 04/01/2018 1008 Gross per 24 hour  Intake 760 ml  Output 600 ml  Net 160 ml  Filed Weights   03/29/18 1113 03/29/18 1846  Weight: 49.1 kg 45.1 kg   Examination: Physical Exam:  Constitutional: Thin African-American female currently no acute distress appears calm and more comfortable today Eyes: Lids and conjunctive are normal.  Sclera anicteric ENMT: External ears and nose appear normal.  Grossly normal hearing.  Mucous members are more moist. Neck: Appears supple no JVD Respiratory: Diminished auscultation bilaterally no appreciable wheezing, rales, rhonchi.  Patient was not tachypneic or using any accessory muscles to breathe Cardiovascular: Regular rate and rhythm.  No appreciable murmurs, rubs, gallops.  Mild lower extremity edema Abdomen: Soft, nontender, nondistended.  Bowel sounds present.  GU: Deferred Musculoskeletal: No clubbing or cyanosis noted.  No joint deformities in upper extremities appreciated Skin: No appreciable rashes or lesions on to skin evaluation. Neurologic: Cranial nerves II through XII grossly intact no appreciable  focal deficits.  Romberg sign cerebellar reflexes were not assessed Psychiatric: Awake and alert.  Has a slightly depressed appearing mood and flat affect.  Data Reviewed: I have personally reviewed following labs and imaging studies  CBC: Recent Labs  Lab 03/29/18 1148 03/30/18 0228 03/31/18 0231 04/01/18 0219  WBC 16.7* 13.7* 12.8* 12.3*  NEUTROABS 13.8*  --   --  9.0*  HGB 8.9* 8.1* 7.7* 9.1*  HCT 29.1* 26.1* 23.7* 28.1*  MCV 82.7 80.6 80.1 79.6*  PLT 448* 378 359 353   Basic Metabolic Panel: Recent Labs  Lab 03/29/18 1148 03/30/18 0228 03/31/18 0231 04/01/18 0219  NA 149* 145 142 141  K 3.8 3.1* 3.3* 4.3  CL 120* 119* 119* 114*  CO2 16* 16* 16* 19*  GLUCOSE 102* 89 161* 103*  BUN 17 15 13 10   CREATININE 1.39* 1.22* 1.39* 1.37*  CALCIUM 7.3* 7.1* 6.7* 7.2*  MG  --   --  1.0* 2.0  PHOS  --   --  1.5* 3.2   GFR: Estimated Creatinine Clearance: 23.7 mL/min (A) (by C-G formula based on SCr of 1.37 mg/dL (H)). Liver Function Tests: Recent Labs  Lab 03/29/18 1148 04/01/18 0219  AST 18 14*  ALT 8 7  ALKPHOS 89 80  BILITOT 1.1 0.4  PROT 7.3 6.2*  ALBUMIN 2.2* 1.7*   Recent Labs  Lab 03/29/18 1148  LIPASE 42   No results for input(s): AMMONIA in the last 168 hours. Coagulation Profile: No results for input(s): INR, PROTIME in the last 168 hours. Cardiac Enzymes: No results for input(s): CKTOTAL, CKMB, CKMBINDEX, TROPONINI in the last 168 hours. BNP (last 3 results) No results for input(s): PROBNP in the last 8760 hours. HbA1C: No results for input(s): HGBA1C in the last 72 hours. CBG: No results for input(s): GLUCAP in the last 168 hours. Lipid Profile: No results for input(s): CHOL, HDL, LDLCALC, TRIG, CHOLHDL, LDLDIRECT in the last 72 hours. Thyroid Function Tests: Recent Labs    04/01/18 0219  TSH 0.571   Anemia Panel: Recent Labs    04/01/18 0219 04/01/18 0743  VITAMINB12  --  670  FOLATE  --  3.7*  FERRITIN  --  224  TIBC  --  123*   IRON  --  9*  RETICCTPCT 1.5  --    Sepsis Labs: No results for input(s): PROCALCITON, LATICACIDVEN in the last 168 hours.  Recent Results (from the past 240 hour(s))  Urine culture     Status: None   Collection Time: 03/29/18  1:33 PM  Result Value Ref Range Status   Specimen Description URINE, CATHETERIZED  Final  Special Requests NONE  Final   Culture   Final    NO GROWTH Performed at [facility], [place] [address], [place], [place] [address]    Report Status 03/30/2018 FINAL  Final      Estimated body mass index is 18.79 kg/m as calculated from the following:   Height as of this encounter: 5\' 1"  (1.549 m).   Weight as of this encounter: 45.1 kg.  Malnutrition Type:  Nutrition Problem: Severe Malnutrition Etiology: acute illness, poor appetite(Deconditioning following prior hospitilization)   Malnutrition Characteristics:  Signs/Symptoms: percent weight loss, moderate muscle depletion   Nutrition Interventions:   Radiology Studies: No results found. Scheduled Meds: . atorvastatin  80 mg Oral q1800  . carvedilol  12.5 mg Oral BID WC  . cholecalciferol  5,000 Units Oral Daily  . docusate sodium  100 mg Oral BID  . enoxaparin (LOVENOX) injection  30 mg Subcutaneous Q24H  . hydroxychloroquine  200 mg Oral Daily  . iron polysaccharides  150 mg Oral Daily  . lactose free nutrition  237 mL Oral BID BM  . levETIRAcetam  1,000 mg Oral Daily  . pantoprazole  40 mg Oral Daily  . ticagrelor  90 mg Oral BID   Continuous Infusions:   LOS: 0 days   Kerney Elbe, DO [facility] PAGER is on AMION  If 7PM-7AM, please contact night-coverage [email or web address] Password [facility] 04/01/2018, 3:28 PM

## 2018-04-01 NOTE — Clinical Social Work Note (Signed)
[name] started insurance auth.   [name], [name]

## 2018-04-01 NOTE — Telephone Encounter (Signed)
Patient's husband advised we can do low dose prednisone. Prescription sent to the pharmacy. Advised we can schedule an appointment after patient is out of the hospital and feeling better.

## 2018-04-01 NOTE — Progress Notes (Addendum)
Calorie Count Note  48 hour calorie count ordered.  Diet: Regular Supplements: Boost Plus BID  Breakfast: no ticket Lunch 3/14: 500 kcals, 7 gm protein Dinner 3/14: 350 kcals, 3 gm protein Supplements: 1 Boost Plus, 360 kcals, 14 gm protein  Total intake: 1160 kcal (85% of minimum estimated needs)  24 protein (35% of minimum estimated needs)  Nutrition Dx: Severe malnutrition in context of acute illness/injury, ongoing  Goal: Pt to meet greater than or equal to 90% of their needs, progressing  RD spoke with Legrand Como, RN; pt likes Boost Plus supplements. PO intake 75% this AM at breakfast per flowsheet records.  Intervention:   D/C calorie count  Continue Boost Plus po BID  Arthur Holms, RD, LDN Pager #: [phone] After-Hours Pager #: [phone]

## 2018-04-01 NOTE — Telephone Encounter (Signed)
May consider low dose Prednisone.

## 2018-04-02 ENCOUNTER — Inpatient Hospital Stay (HOSPITAL_COMMUNITY): Payer: Medicare PPO

## 2018-04-02 LAB — CBC WITH DIFFERENTIAL/PLATELET
ABS IMMATURE GRANULOCYTES: 0.42 10*3/uL — AB (ref 0.00–0.07)
Basophils Absolute: 0 10*3/uL (ref 0.0–0.1)
Basophils Relative: 0 %
Eosinophils Absolute: 0.3 10*3/uL (ref 0.0–0.5)
Eosinophils Relative: 2 %
HCT: 25.3 % — ABNORMAL LOW (ref 36.0–46.0)
Hemoglobin: 7.9 g/dL — ABNORMAL LOW (ref 12.0–15.0)
IMMATURE GRANULOCYTES: 3 %
LYMPHS PCT: 14 %
Lymphs Abs: 2.4 10*3/uL (ref 0.7–4.0)
MCH: 24.8 pg — ABNORMAL LOW (ref 26.0–34.0)
MCHC: 31.2 g/dL (ref 30.0–36.0)
MCV: 79.3 fL — ABNORMAL LOW (ref 80.0–100.0)
Monocytes Absolute: 1.6 10*3/uL — ABNORMAL HIGH (ref 0.1–1.0)
Monocytes Relative: 10 %
NEUTROS ABS: 12 10*3/uL — AB (ref 1.7–7.7)
Neutrophils Relative %: 71 %
Platelets: 361 10*3/uL (ref 150–400)
RBC: 3.19 MIL/uL — ABNORMAL LOW (ref 3.87–5.11)
RDW: 17.4 % — ABNORMAL HIGH (ref 11.5–15.5)
WBC: 16.7 10*3/uL — ABNORMAL HIGH (ref 4.0–10.5)
nRBC: 0.1 % (ref 0.0–0.2)

## 2018-04-02 LAB — MAGNESIUM: Magnesium: 1.6 mg/dL — ABNORMAL LOW (ref 1.7–2.4)

## 2018-04-02 LAB — COMPREHENSIVE METABOLIC PANEL
ALT: 9 U/L (ref 0–44)
AST: 17 U/L (ref 15–41)
Albumin: 1.7 g/dL — ABNORMAL LOW (ref 3.5–5.0)
Alkaline Phosphatase: 89 U/L (ref 38–126)
Anion gap: 8 (ref 5–15)
BUN: 11 mg/dL (ref 8–23)
CO2: 22 mmol/L (ref 22–32)
Calcium: 8.1 mg/dL — ABNORMAL LOW (ref 8.9–10.3)
Chloride: 110 mmol/L (ref 98–111)
Creatinine, Ser: 1.38 mg/dL — ABNORMAL HIGH (ref 0.44–1.00)
GFR calc Af Amer: 42 mL/min — ABNORMAL LOW (ref >60)
GFR calc non Af Amer: 36 mL/min — ABNORMAL LOW (ref >60)
Glucose, Bld: 81 mg/dL (ref 70–99)
Potassium: 5 mmol/L (ref 3.5–5.1)
Sodium: 140 mmol/L (ref 135–145)
Total Bilirubin: 0.5 mg/dL (ref 0.3–1.2)
Total Protein: 6.2 g/dL — ABNORMAL LOW (ref 6.5–8.1)

## 2018-04-02 LAB — CALCIUM, IONIZED: CALCIUM, IONIZED, SERUM: 4.5 mg/dL (ref 4.5–5.6)

## 2018-04-02 LAB — PHOSPHORUS: Phosphorus: 3.5 mg/dL (ref 2.5–4.6)

## 2018-04-02 MED ORDER — MAGNESIUM SULFATE 2 GM/50ML IV SOLN
2.0000 g | Freq: Once | INTRAVENOUS | Status: AC
  Administered 2018-04-02: 2 g via INTRAVENOUS
  Filled 2018-04-02: qty 50

## 2018-04-02 NOTE — Progress Notes (Signed)
PROGRESS NOTE    [name]  VHQ:[id number] DOB: 1940/02/23 DOA: 03/29/2018 PCP: Glendale Chard, MD   Brief Narrative:  HPI per Dr Karmen Bongo on 03/28/2013 [name] is a 78 y.o. female with medical history significant of seizures; RA; HTN; HLD; and CKD presenting with SOB.  The patient had a stent placed about a month ago.  Since then, she is just laying in bed every day.  She has an in-home nurse twice a week.  The nurse was concerned about SOB and sent her in by ambulance.  She is not currently SOB but has been for about 2-3 days.  Slight cough, nonproductive.  No sick contacts.  She intermittently does not eat/drink as much as usual.  No GI symptoms.  With her RA, she is very limited with her mobility - she needs assistance with ambulation.  They were not in favor of SNF admission last time, but they would consider it now - vs. Home health for 5 hours a day, which is pending with insurance now.  Interim History She was complaining of weakness and appeared dehydrated.  Electrolytes were severely off and they were replete. PT/OT evaluated and recommending SNF and last admission family refused for SNF but they are currently agreeable.  Social work will obtain authorization for Monday for SNF placement.  Currently treating all her abnormal lab values and rehydrating currently and is improving. WBC worsened so will get Blood Cx and CXR and continue to Monitor if WBC is improved in AM can D/C to SNF  Assessment & Plan:   Principal Problem:   Dehydration Active Problems:   Chronic kidney disease, stage III (moderate) (HCC)   Essential hypertension   Failure to thrive in adult   Protein-calorie malnutrition, severe  Failure to Thrive -Continued with calorie count with Dietician and now D/C'd -Continue with high-protein diet -No signs of infection is noted on urinalysis or chest x-ray. -Patient is afebrile.   -Mild elevation of the WBC count most likely from dehydration and was  trending down from 13.7 -> 12.8 -> 12.3 but acutely worsened. ? To Pain -Replete all Electrolytes as Necessary as below  Debility -Patient will benefit going to skilled nursing facility -Discussed with the social worker, will obtain authorization on Monday for SNF placement.  Anemia Chronic blood loss/Anemia of chronic kidney disease/Normocytic Anemia -Patient had hemoglobin of 8.4 at the time of the discharge last hospitalization and now repeat Hb on Admission was 8.9 -Hb/Hct went from 8.9/29.1 -> 8.1/26.1 -> and 7.7/23.7 and ? Dilutional Drop; Hgb/Hct went from 9.1/28.1 -> 7.9/25.3 -Checked Anemia Panel and showed an iron level of 9, U IBC of 114, TIBC of 123, saturation ratios of 7%, ferritin level of 224, folate of 2.7, vitamin B12 670 -Continue to Monitor for S/Sx of Bleeding  Leukocytosis, acutely worsened -Originally thought to be In the setting of Dehydration. Unlikely has infection currently as she is afebrile but TMax this AM was 99.9 -WBC went from 16.7 -> 13.7 -> 12.8 -> 12.3 and was trending down but acutely worsened to 16.7 -Repeat CXR this AM showed Cardiomegaly with pulmonary edema and small right pleural effusion. -Check Blood Cx x2 today  -Abdomen was hurting some will will get KUB -Urinalysis on Admission showed Hazy Appearance, Small Hgb, 20 Ketones, Rare Bacteria and 0-5 WBC -Continue to Monitor for S/Sx of Infection and hold Abx at this time -Repeat CMP in AM   Dehydration, improved  -Patient had elevated sodium at 149 and this  is improved to 140' IVF now stopped -Continue to Monitor Volume Status Carefully  CKD Stage 3 -Patient had an AKI on CKD last admission -BUN/Cr went from 17/1.39 -> 15/1.22 and is now 11/1.38 -Avoid Nephrotoxic Medications, Contrast Dyes and Hypotension -Continue to Monitor and Trend Renal Function and repeat CMP in AM   Recent history of GI bleed -EGD showed AVMs, clipped -Continue with the Pantoprazole 40 mg po Daily   CAD S/P  stent placement -Continue Ticagrelor 90 mg po BID, Atorvastatin 80 mg po qHS and Carvedilol 12.5 mg po BID   Hypertension -C/w Carvedilol 12.5 mg po BID  Seizure Disorder -Continue with Levetiracetam 1000 mg po Daily  -Check Keppra Level while hospitalized   Rheumatoid Arthritis -Continue with the Hydroxychloroquine 200 mg po Daily -Was previously on Humira and stopped due to cost -Follow up with Rheumatology Dr. Estanislado Pandy in Rheumatology as she has called in a Low dose Prescription to the patients Pharmacy    Hyperlipidemia -Continue with Atorvastatin 80 mg po qHS  Severe Malnutrition in the Context of Acute Illness/Injury -Nutritionist Consulted for further evaluation and recommendations -C/w Boost Plus 237 mL BID  Hypomagnesemia -Paitent's Mag Level was 1.6 -Replete with IV Mag Sulfate 2 grams  -Continue to Monitor and Replete as Necessary -Repeat Mag Level in AM   Hypokalemia -Patient's K+ was 3.3 and improved to 5.0 -Continue to Monitor and Replete as Necessary -Repeat CMP in AM  Hypophosphatemia -Patient's Phos Level was 1.5 and improved to 3.5 -Continue to Monitor and Replete as Necessary -Repeat Phos Level in the AM   Hypocalcemia -Patient's Ca2+ was 6.7 and improved to 8.1 -Check Ionized Ca2+ was not done -Corrected Calcium was 9.0 -Continue to Mnonitor and Replete A NEcesary  Non-Gap Metabolic Acidosis -CO2 was 16 and AG was 7; In the setting of Hyperchloremia  -Now is improving and CO2 is 22 and anion gap is 8.  Chloride levels 110 now -IVF now stopped  -Continue to Monitor and Repeat CMP in AM   DVT prophylaxis: Enoxaparin 30 mg sq  Code Status: FULL CODE Family Communication: Discussed with husband at bedside Disposition Plan: SNF if Leukocytosis is improved in AM   Consultants:   None   Procedures: None  Antimicrobials:  Anti-infectives (From admission, onward)   Start     Dose/Rate Route Frequency Ordered Stop   03/30/18 1000   hydroxychloroquine (PLAQUENIL) tablet 200 mg     200 mg Oral Daily 03/29/18 1730       Subjective: Seen and examined at bedside and states that she still does not feel as well and feels fatigued. Denies ay CP but has a very flat affect and appears depressed. States that she doe have some pain in the abdomen.   Objective: Vitals:   04/01/18 0824 04/01/18 1700 04/01/18 2308 04/02/18 0830  BP: (!) 143/72 130/65 140/74 139/74  Pulse: 74 76 97 90  Resp:  18 18 16   Temp: 98.3 F (36.8 C) 98.5 F (36.9 C) 99.9 F (37.7 C) 98.8 F (37.1 C)  TempSrc: Oral Oral Oral Axillary  SpO2: 98% 99% 100% 93%  Weight:      Height:        Intake/Output Summary (Last 24 hours) at 04/02/2018 1555 Last data filed at 04/02/2018 0500 Gross per 24 hour  Intake 720 ml  Output 550 ml  Net 170 ml   Filed Weights   03/29/18 1113 03/29/18 1846  Weight: 49.1 kg 45.1 kg   Examination: Physical Exam:  Constitutional: Thin African-American female currently no acute distress appears calm but does appear uncomfortable today and not as well as yesterday. Eyes: Lids and conjunctive are normal.  Sclera anicteric ENMT: External ears nose appear normal.  Grossly normal hearing.  Mucous members are moist Neck: Appears supple no JVD Respiratory: Diminished auscultation bilaterally no appreciable wheezing, rales, rhonchi.  Patient not tachypneic using accessory muscles to breathe but did have some mild crackles. Cardiovascular: Regular rate and rhythm.  No appreciable murmurs, rubs, gallops.  Has trace lower extremity edema Abdomen: Soft, tender to palpate in the mid abdomen, nondistended.  Bowel sounds present GU: Deferred Musculoskeletal: No contractures or cyanosis.  No joint deformities in the upper and lower extremities appreciated Skin: Skin is warm and dry with no appreciable rashes or lesions on limited skin evaluation Neurologic: Cranial nerves II through XII grossly intact no appreciable focal deficits.   Romberg sign cerebellar reflexes were not assessed Psychiatric: Awake and alert and oriented x3.  Has a depressed appearing mood and a flat affect  Data Reviewed: I have personally reviewed following labs and imaging studies  CBC: Recent Labs  Lab 03/29/18 1148 03/30/18 0228 03/31/18 0231 04/01/18 0219 04/02/18 0200  WBC 16.7* 13.7* 12.8* 12.3* 16.7*  NEUTROABS 13.8*  --   --  9.0* 12.0*  HGB 8.9* 8.1* 7.7* 9.1* 7.9*  HCT 29.1* 26.1* 23.7* 28.1* 25.3*  MCV 82.7 80.6 80.1 79.6* 79.3*  PLT 448* 378 359 268 528   Basic Metabolic Panel: Recent Labs  Lab 03/29/18 1148 03/30/18 0228 03/31/18 0231 04/01/18 0219 04/02/18 0200  NA 149* 145 142 141 140  K 3.8 3.1* 3.3* 4.3 5.0  CL 120* 119* 119* 114* 110  CO2 16* 16* 16* 19* 22  GLUCOSE 102* 89 161* 103* 81  BUN 17 15 13 10 11   CREATININE 1.39* 1.22* 1.39* 1.37* 1.38*  CALCIUM 7.3* 7.1* 6.7* 7.2* 8.1*  MG  --   --  1.0* 2.0 1.6*  PHOS  --   --  1.5* 3.2 3.5   GFR: Estimated Creatinine Clearance: 23.5 mL/min (A) (by C-G formula based on SCr of 1.38 mg/dL (H)). Liver Function Tests: Recent Labs  Lab 03/29/18 1148 04/01/18 0219 04/02/18 0200  AST 18 14* 17  ALT 8 7 9   ALKPHOS 89 80 89  BILITOT 1.1 0.4 0.5  PROT 7.3 6.2* 6.2*  ALBUMIN 2.2* 1.7* 1.7*   Recent Labs  Lab 03/29/18 1148  LIPASE 42   No results for input(s): AMMONIA in the last 168 hours. Coagulation Profile: No results for input(s): INR, PROTIME in the last 168 hours. Cardiac Enzymes: No results for input(s): CKTOTAL, CKMB, CKMBINDEX, TROPONINI in the last 168 hours. BNP (last 3 results) No results for input(s): PROBNP in the last 8760 hours. HbA1C: No results for input(s): HGBA1C in the last 72 hours. CBG: No results for input(s): GLUCAP in the last 168 hours. Lipid Profile: No results for input(s): CHOL, HDL, LDLCALC, TRIG, CHOLHDL, LDLDIRECT in the last 72 hours. Thyroid Function Tests: Recent Labs    04/01/18 0219  TSH 0.571   Anemia  Panel: Recent Labs    04/01/18 0219 04/01/18 0743  VITAMINB12  --  670  FOLATE  --  3.7*  FERRITIN  --  224  TIBC  --  123*  IRON  --  9*  RETICCTPCT 1.5  --    Sepsis Labs: No results for input(s): PROCALCITON, LATICACIDVEN in the last 168 hours.  Recent Results (from the past 240 hour(s))  Urine culture     Status: None   Collection Time: 03/29/18  1:33 PM  Result Value Ref Range Status   Specimen Description URINE, CATHETERIZED  Final   Special Requests NONE  Final   Culture   Final    NO GROWTH Performed at [facility], [address], [place], [place] [address]    Report Status 03/30/2018 FINAL  Final      Estimated body mass index is 18.79 kg/m as calculated from the following:   Height as of this encounter: 5\' 1"  (1.549 m).   Weight as of this encounter: 45.1 kg.  Malnutrition Type:  Nutrition Problem: Severe Malnutrition Etiology: acute illness, poor appetite(Deconditioning following prior hospitilization)   Malnutrition Characteristics:  Signs/Symptoms: percent weight loss, moderate muscle depletion   Nutrition Interventions:   Radiology Studies: Dg Chest Port 1 View  Result Date: 04/02/2018 CLINICAL DATA:  Shortness of breath EXAM: PORTABLE CHEST 1 VIEW COMPARISON:  Four days ago FINDINGS: Cardiomegaly. Negative aortic contours. Interstitial coarsening similar to prior, with cephalized blood flow. Probable small right pleural effusion creating asymmetric density at the right base. No pneumothorax. IMPRESSION: Cardiomegaly with pulmonary edema and small right pleural effusion. Electronically Signed   By: Monte Fantasia M.D.   On: 04/02/2018 09:24   Scheduled Meds: . atorvastatin  80 mg Oral q1800  . carvedilol  12.5 mg Oral BID WC  . cholecalciferol  5,000 Units Oral Daily  . docusate sodium  100 mg Oral BID  . enoxaparin (LOVENOX) injection  30 mg Subcutaneous Q24H  . hydroxychloroquine  200 mg Oral Daily  . iron polysaccharides  150 mg Oral  Daily  . lactose free nutrition  237 mL Oral BID BM  . levETIRAcetam  1,000 mg Oral Daily  . pantoprazole  40 mg Oral Daily  . ticagrelor  90 mg Oral BID   Continuous Infusions:   LOS: 1 day   Kerney Elbe, DO [facility] PAGER is on [place]  If 7PM-7AM, please contact night-coverage [email or web address] Password [facility] 04/02/2018, 3:55 PM

## 2018-04-02 NOTE — Clinical Social Work Note (Signed)
[facility] has received insurance auth.   [place], [place]

## 2018-04-03 DIAGNOSIS — Z20828 Contact with and (suspected) exposure to other viral communicable diseases: Secondary | ICD-10-CM | POA: Diagnosis present

## 2018-04-03 DIAGNOSIS — Z90710 Acquired absence of both cervix and uterus: Secondary | ICD-10-CM | POA: Diagnosis not present

## 2018-04-03 DIAGNOSIS — Z781 Physical restraint status: Secondary | ICD-10-CM | POA: Diagnosis not present

## 2018-04-03 DIAGNOSIS — I5031 Acute diastolic (congestive) heart failure: Secondary | ICD-10-CM | POA: Diagnosis not present

## 2018-04-03 DIAGNOSIS — R410 Disorientation, unspecified: Secondary | ICD-10-CM | POA: Diagnosis not present

## 2018-04-03 DIAGNOSIS — N182 Chronic kidney disease, stage 2 (mild): Secondary | ICD-10-CM | POA: Diagnosis not present

## 2018-04-03 DIAGNOSIS — J810 Acute pulmonary edema: Secondary | ICD-10-CM | POA: Diagnosis not present

## 2018-04-03 DIAGNOSIS — M069 Rheumatoid arthritis, unspecified: Secondary | ICD-10-CM | POA: Diagnosis present

## 2018-04-03 DIAGNOSIS — Z87891 Personal history of nicotine dependence: Secondary | ICD-10-CM | POA: Diagnosis not present

## 2018-04-03 DIAGNOSIS — Z7401 Bed confinement status: Secondary | ICD-10-CM | POA: Diagnosis not present

## 2018-04-03 DIAGNOSIS — R451 Restlessness and agitation: Secondary | ICD-10-CM | POA: Diagnosis present

## 2018-04-03 DIAGNOSIS — E7800 Pure hypercholesterolemia, unspecified: Secondary | ICD-10-CM | POA: Diagnosis present

## 2018-04-03 DIAGNOSIS — F331 Major depressive disorder, recurrent, moderate: Secondary | ICD-10-CM | POA: Diagnosis not present

## 2018-04-03 DIAGNOSIS — Z4659 Encounter for fitting and adjustment of other gastrointestinal appliance and device: Secondary | ICD-10-CM | POA: Diagnosis not present

## 2018-04-03 DIAGNOSIS — Z9049 Acquired absence of other specified parts of digestive tract: Secondary | ICD-10-CM | POA: Diagnosis not present

## 2018-04-03 DIAGNOSIS — I252 Old myocardial infarction: Secondary | ICD-10-CM | POA: Diagnosis not present

## 2018-04-03 DIAGNOSIS — M2550 Pain in unspecified joint: Secondary | ICD-10-CM | POA: Diagnosis not present

## 2018-04-03 DIAGNOSIS — Z9911 Dependence on respirator [ventilator] status: Secondary | ICD-10-CM | POA: Diagnosis not present

## 2018-04-03 DIAGNOSIS — R569 Unspecified convulsions: Secondary | ICD-10-CM | POA: Diagnosis not present

## 2018-04-03 DIAGNOSIS — R0689 Other abnormalities of breathing: Secondary | ICD-10-CM | POA: Diagnosis not present

## 2018-04-03 DIAGNOSIS — J90 Pleural effusion, not elsewhere classified: Secondary | ICD-10-CM | POA: Diagnosis not present

## 2018-04-03 DIAGNOSIS — Z8042 Family history of malignant neoplasm of prostate: Secondary | ICD-10-CM | POA: Diagnosis not present

## 2018-04-03 DIAGNOSIS — D631 Anemia in chronic kidney disease: Secondary | ICD-10-CM | POA: Diagnosis present

## 2018-04-03 DIAGNOSIS — Z7952 Long term (current) use of systemic steroids: Secondary | ICD-10-CM | POA: Diagnosis not present

## 2018-04-03 DIAGNOSIS — E43 Unspecified severe protein-calorie malnutrition: Secondary | ICD-10-CM | POA: Diagnosis not present

## 2018-04-03 DIAGNOSIS — I130 Hypertensive heart and chronic kidney disease with heart failure and stage 1 through stage 4 chronic kidney disease, or unspecified chronic kidney disease: Secondary | ICD-10-CM | POA: Diagnosis present

## 2018-04-03 DIAGNOSIS — I2510 Atherosclerotic heart disease of native coronary artery without angina pectoris: Secondary | ICD-10-CM | POA: Diagnosis present

## 2018-04-03 DIAGNOSIS — F323 Major depressive disorder, single episode, severe with psychotic features: Secondary | ICD-10-CM | POA: Diagnosis not present

## 2018-04-03 DIAGNOSIS — R627 Adult failure to thrive: Secondary | ICD-10-CM | POA: Diagnosis present

## 2018-04-03 DIAGNOSIS — G9340 Encephalopathy, unspecified: Secondary | ICD-10-CM | POA: Diagnosis not present

## 2018-04-03 DIAGNOSIS — R404 Transient alteration of awareness: Secondary | ICD-10-CM | POA: Diagnosis not present

## 2018-04-03 DIAGNOSIS — I5033 Acute on chronic diastolic (congestive) heart failure: Secondary | ICD-10-CM | POA: Diagnosis present

## 2018-04-03 DIAGNOSIS — J9601 Acute respiratory failure with hypoxia: Secondary | ICD-10-CM | POA: Diagnosis not present

## 2018-04-03 DIAGNOSIS — E860 Dehydration: Secondary | ICD-10-CM | POA: Diagnosis not present

## 2018-04-03 DIAGNOSIS — Z955 Presence of coronary angioplasty implant and graft: Secondary | ICD-10-CM | POA: Diagnosis not present

## 2018-04-03 DIAGNOSIS — N183 Chronic kidney disease, stage 3 (moderate): Secondary | ICD-10-CM | POA: Diagnosis present

## 2018-04-03 DIAGNOSIS — N179 Acute kidney failure, unspecified: Secondary | ICD-10-CM | POA: Diagnosis present

## 2018-04-03 DIAGNOSIS — R0602 Shortness of breath: Secondary | ICD-10-CM | POA: Diagnosis not present

## 2018-04-03 DIAGNOSIS — R0902 Hypoxemia: Secondary | ICD-10-CM | POA: Diagnosis present

## 2018-04-03 DIAGNOSIS — R630 Anorexia: Secondary | ICD-10-CM | POA: Diagnosis not present

## 2018-04-03 DIAGNOSIS — I10 Essential (primary) hypertension: Secondary | ICD-10-CM | POA: Diagnosis not present

## 2018-04-03 DIAGNOSIS — R6889 Other general symptoms and signs: Secondary | ICD-10-CM | POA: Diagnosis not present

## 2018-04-03 DIAGNOSIS — G4089 Other seizures: Secondary | ICD-10-CM | POA: Diagnosis not present

## 2018-04-03 DIAGNOSIS — Z79899 Other long term (current) drug therapy: Secondary | ICD-10-CM | POA: Diagnosis not present

## 2018-04-03 DIAGNOSIS — G40909 Epilepsy, unspecified, not intractable, without status epilepticus: Secondary | ICD-10-CM | POA: Diagnosis present

## 2018-04-03 DIAGNOSIS — M6281 Muscle weakness (generalized): Secondary | ICD-10-CM | POA: Diagnosis not present

## 2018-04-03 DIAGNOSIS — F332 Major depressive disorder, recurrent severe without psychotic features: Secondary | ICD-10-CM | POA: Diagnosis not present

## 2018-04-03 DIAGNOSIS — F419 Anxiety disorder, unspecified: Secondary | ICD-10-CM | POA: Diagnosis not present

## 2018-04-03 DIAGNOSIS — G9341 Metabolic encephalopathy: Secondary | ICD-10-CM | POA: Diagnosis present

## 2018-04-03 DIAGNOSIS — R4020 Unspecified coma: Secondary | ICD-10-CM | POA: Diagnosis not present

## 2018-04-03 LAB — CBC
HCT: 23.5 % — ABNORMAL LOW (ref 36.0–46.0)
HEMOGLOBIN: 7.4 g/dL — AB (ref 12.0–15.0)
MCH: 25 pg — ABNORMAL LOW (ref 26.0–34.0)
MCHC: 31.5 g/dL (ref 30.0–36.0)
MCV: 79.4 fL — ABNORMAL LOW (ref 80.0–100.0)
Platelets: 366 10*3/uL (ref 150–400)
RBC: 2.96 MIL/uL — ABNORMAL LOW (ref 3.87–5.11)
RDW: 17.2 % — ABNORMAL HIGH (ref 11.5–15.5)
WBC: 15.7 10*3/uL — ABNORMAL HIGH (ref 4.0–10.5)
nRBC: 0 % (ref 0.0–0.2)

## 2018-04-03 LAB — BASIC METABOLIC PANEL
Anion gap: 7 (ref 5–15)
BUN: 17 mg/dL (ref 8–23)
CO2: 24 mmol/L (ref 22–32)
Calcium: 8 mg/dL — ABNORMAL LOW (ref 8.9–10.3)
Chloride: 107 mmol/L (ref 98–111)
Creatinine, Ser: 1.59 mg/dL — ABNORMAL HIGH (ref 0.44–1.00)
GFR calc Af Amer: 35 mL/min — ABNORMAL LOW (ref >60)
GFR calc non Af Amer: 31 mL/min — ABNORMAL LOW (ref >60)
Glucose, Bld: 93 mg/dL (ref 70–99)
Potassium: 4.7 mmol/L (ref 3.5–5.1)
Sodium: 138 mmol/L (ref 135–145)

## 2018-04-03 LAB — CBC WITH DIFFERENTIAL/PLATELET
Abs Immature Granulocytes: 0.25 10*3/uL — ABNORMAL HIGH (ref 0.00–0.07)
Basophils Absolute: 0 10*3/uL (ref 0.0–0.1)
Basophils Relative: 0 %
Eosinophils Absolute: 0.2 10*3/uL (ref 0.0–0.5)
Eosinophils Relative: 2 %
HCT: 22.2 % — ABNORMAL LOW (ref 36.0–46.0)
Hemoglobin: 7.1 g/dL — ABNORMAL LOW (ref 12.0–15.0)
IMMATURE GRANULOCYTES: 2 %
Lymphocytes Relative: 14 %
Lymphs Abs: 2.2 10*3/uL (ref 0.7–4.0)
MCH: 25.7 pg — ABNORMAL LOW (ref 26.0–34.0)
MCHC: 32 g/dL (ref 30.0–36.0)
MCV: 80.4 fL (ref 80.0–100.0)
Monocytes Absolute: 1.4 10*3/uL — ABNORMAL HIGH (ref 0.1–1.0)
Monocytes Relative: 9 %
NEUTROS PCT: 73 %
Neutro Abs: 11.4 10*3/uL — ABNORMAL HIGH (ref 1.7–7.7)
Platelets: 395 10*3/uL (ref 150–400)
RBC: 2.76 MIL/uL — ABNORMAL LOW (ref 3.87–5.11)
RDW: 17.6 % — ABNORMAL HIGH (ref 11.5–15.5)
WBC: 15.6 10*3/uL — AB (ref 4.0–10.5)
nRBC: 0.1 % (ref 0.0–0.2)

## 2018-04-03 LAB — COMPREHENSIVE METABOLIC PANEL
ALT: 8 U/L (ref 0–44)
AST: 15 U/L (ref 15–41)
Albumin: 1.7 g/dL — ABNORMAL LOW (ref 3.5–5.0)
Alkaline Phosphatase: 83 U/L (ref 38–126)
Anion gap: 7 (ref 5–15)
BUN: 15 mg/dL (ref 8–23)
CO2: 22 mmol/L (ref 22–32)
Calcium: 7.8 mg/dL — ABNORMAL LOW (ref 8.9–10.3)
Chloride: 110 mmol/L (ref 98–111)
Creatinine, Ser: 1.7 mg/dL — ABNORMAL HIGH (ref 0.44–1.00)
GFR calc Af Amer: 33 mL/min — ABNORMAL LOW (ref >60)
GFR calc non Af Amer: 28 mL/min — ABNORMAL LOW (ref >60)
Glucose, Bld: 75 mg/dL (ref 70–99)
Potassium: 4.9 mmol/L (ref 3.5–5.1)
SODIUM: 139 mmol/L (ref 135–145)
Total Bilirubin: 0.5 mg/dL (ref 0.3–1.2)
Total Protein: 6 g/dL — ABNORMAL LOW (ref 6.5–8.1)

## 2018-04-03 LAB — PHOSPHORUS: Phosphorus: 3.9 mg/dL (ref 2.5–4.6)

## 2018-04-03 LAB — MAGNESIUM: Magnesium: 2 mg/dL (ref 1.7–2.4)

## 2018-04-03 MED ORDER — ONDANSETRON HCL 4 MG PO TABS: 4.0000 mg | ORAL_TABLET | Freq: Four times a day (QID) | ORAL | 0 refills | Status: DC | PRN

## 2018-04-03 MED ORDER — BOOST PLUS PO LIQD: 237.0000 mL | Freq: Three times a day (TID) | ORAL | 0 refills | Status: DC

## 2018-04-03 MED ORDER — POLYSACCHARIDE IRON COMPLEX 150 MG PO CAPS: 150.0000 mg | ORAL_CAPSULE | Freq: Every day | ORAL | Status: DC

## 2018-04-03 MED ORDER — BOOST PLUS PO LIQD
237.0000 mL | Freq: Three times a day (TID) | ORAL | Status: DC
  Administered 2018-04-03: 237 mL via ORAL
  Filled 2018-04-03 (×2): qty 237

## 2018-04-03 MED ORDER — ACETAMINOPHEN 325 MG PO TABS: 650.0000 mg | ORAL_TABLET | Freq: Four times a day (QID) | ORAL | Status: DC | PRN

## 2018-04-03 NOTE — Progress Notes (Signed)
Physical Therapy Treatment Patient Details Name: [name] MRN: [id number] DOB: [birth year] Today's Date: 04/03/2018    History of Present Illness Pt is a 78 y.o. F with significant PMH of seizures, RA, and CKD presenting with SOB. Had stent placed about a month ago and has been mostly sedentary. Admitted with dehydration and failure to thrive.     PT Comments    Patient seen for activity progression. Remains limited with strength and activity tolerance. Continues to require moderate to max assist for mobility. SNF remains appropriate.   Follow Up Recommendations  SNF;Supervision for mobility/OOB     Equipment Recommendations  None recommended by PT    Recommendations for Other Services OT consult     Precautions / Restrictions Precautions Precautions: Fall Precaution Comments: bowel and bladder incontinence Restrictions Weight Bearing Restrictions: No    Mobility  Bed Mobility Overal bed mobility: Needs Assistance Bed Mobility: Supine to Sit     Supine to sit: Mod assist     General bed mobility comments: received in recliner  Transfers Overall transfer level: Needs assistance Equipment used: Rolling walker (2 wheeled) Transfers: Sit to/from Stand Sit to Stand: Max assist         General transfer comment: Max assist to power up to standing from recliner with manual hand placement. Heavy reliance on RW.  Ambulation/Gait Ambulation/Gait assistance: Mod assist Gait Distance (Feet): 5 Feet Assistive device: Rolling walker (2 wheeled) Gait Pattern/deviations: Step-to pattern;Trunk flexed;Leaning posteriorly;Shuffle Gait velocity: Decreased   General Gait Details: Patient with flexed posture and poor ability to initiate stride, reports increased pain in her feet today   Stairs             Wheelchair Mobility    Modified Rankin (Stroke Patients Only)       Balance Overall balance assessment: Needs assistance Sitting-balance support:  Bilateral upper extremity supported;Feet supported Sitting balance-Leahy Scale: Poor Sitting balance - Comments: intermittent posterior lean Postural control: Posterior lean Standing balance support: Single extremity supported;During functional activity Standing balance-Leahy Scale: Poor Standing balance comment: requires reminders for proper hand placement.               High Level Balance Comments: very poor balance and inability to follow commands as BLEs very weak unable to take steps other than sliding foot            Cognition Arousal/Alertness: Lethargic;Suspect due to medications Behavior During Therapy: Flat affect Overall Cognitive Status: Impaired/Different from baseline Area of Impairment: Following commands;Problem solving                               General Comments: slow processing. unsure what baselinse is.       Exercises      General Comments General comments (skin integrity, edema, etc.): educated on ankle pumps and LE elevation      Pertinent Vitals/Pain Pain Assessment: Faces Faces Pain Scale: Hurts even more Pain Location: bilateral foot pain Pain Descriptors / Indicators: Grimacing Pain Intervention(s): Monitored during session    Home Living                      Prior Function            PT Goals (current goals can now be found in the care plan section) Acute Rehab PT Goals Patient Stated Goal: to go home PT Goal Formulation: With patient Time For Goal Achievement: 04/13/18 Potential to  Achieve Goals: Fair Progress towards PT goals: Progressing toward goals    Frequency    Min 2X/week      PT Plan Current plan remains appropriate    Co-evaluation              AM-PAC PT "6 Clicks" Mobility   Outcome Measure  Help needed turning from your back to your side while in a flat bed without using bedrails?: A Little Help needed moving from lying on your back to sitting on the side of a flat bed  without using bedrails?: A Little Help needed moving to and from a bed to a chair (including a wheelchair)?: A Little Help needed standing up from a chair using your arms (e.g., wheelchair or bedside chair)?: A Lot Help needed to walk in hospital room?: A Lot Help needed climbing 3-5 steps with a railing? : Total 6 Click Score: 14    End of Session Equipment Utilized During Treatment: Gait belt Activity Tolerance: Patient tolerated treatment well Patient left: in chair;with chair alarm set;with family/visitor present Nurse Communication: Mobility status PT Visit Diagnosis: Difficulty in walking, not elsewhere classified (R26.2);Muscle weakness (generalized) (M62.81);Unsteadiness on feet (R26.81)     Time: 1343-1401 PT Time Calculation (min) (ACUTE ONLY): 18 min  Charges:  $Therapeutic Activity: 8-22 mins                     Alben Deeds, PT DPT  Board Certified Neurologic Specialist [place] Pager [phone] Office [place] 04/03/2018, 4:12 PM

## 2018-04-03 NOTE — Progress Notes (Signed)
Report called to Genia Harold, LPN at [facility] where pt is going for rehab.  Taken via stretcher for discharge by transporters via ambulance to [facility].  No s/s of distress.  Respirations even and unlabored.

## 2018-04-03 NOTE — Discharge Summary (Signed)
Physician Discharge Summary  [name] LOV:[id number] DOB: 10-26-1940 DOA: 03/29/2018  PCP: Glendale Chard, MD  Admit date: 03/29/2018 Discharge date: 04/03/2018  Admitted From: Home Disposition: Skilled nursing facility  Recommendations for Outpatient Follow-up:  1. 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  She has had a poor appetite and has not been moving much since she left the hospital after her stenting.  After her last discharge, she was recommended to go to skilled nursing facility but declined this.  She has an in-home nurse twice a week who felt that the patient was short of breath and decided to send her hospital.  Hospital Course:  Shortness of breath??  This was a concern by the patient's nurse however,this was not noted in the ED after the patient arrived.  There was no hypoxia and chest x-ray not show any infiltrates.  Dehydration, poor oral intake, severe deconditioning and general failure to thrive -The patient continues to eat poorly, and has not been able to ambulate- she will be going to a skilled nursing facility this time as recommended during her previous admission  HTN - Continue Coreg   Coronary artery disease status post RCA  stent - Continue aspirin, Brilinta and statin  Rheumatoid arthritis - Continue DMARD, prednisone and Plaquenil  Stage 3 CKD -Creatinine fluctuates from 1.4-1.9 and has remained within this range  Anemia chronic disease - Hb was ~ 8 when admitted and has dropped to 7.4 with hydration - we have not transfused her -  Ref. Range 03/03/2018 12:56 04/01/2018 07:43  Iron Latest Ref Range: 28 - 170 ug/dL 39 9 (L)  UIBC Latest Units: ug/dL 167 114  TIBC Latest Ref Range: 250 - 450 ug/dL 206 (L) 123 (L)  Saturation Ratios Latest Ref Range: 10.4 - 31.8 % 19 7 (L)  Ferritin Latest Ref Range: 11 - 307 ng/mL 265 224  Folate Latest Ref Range: >5.9 ng/mL  3.7 (L)  Folate, Hemolysate Latest Ref Range: Not Estab. ng/mL 205.0   Folate, RBC Latest Ref Range: >498 ng/mL 847     Seizure disorder Continue Keppra  Discharge Exam: Vitals:   04/02/18 2340 04/03/18 0747  BP: (!) 149/76 (!) 141/76  Pulse: 79 76  Resp: 12 16  Temp: 98 F (36.7 C) 98.4 F (36.9 C)  SpO2: 94% 90%   Vitals:   04/02/18 0830 04/02/18 1647 04/02/18 2340 04/03/18 0747  BP: 139/74 (!) 147/68 (!) 149/76 (!) 141/76  Pulse: 90 72 79 76  Resp: 16 20 12 16   Temp: 98.8 F (37.1 C) 98.1 F (36.7 C) 98 F (36.7 C) 98.4 F (36.9 C)  TempSrc: Axillary Oral    SpO2: 93% 94% 94% 90%  Weight:      Height:        General: Pt is alert, awake, not in acute distress Cardiovascular: RRR, S1/S2 +,  no rubs, no gallops Respiratory: CTA bilaterally, no wheezing, no rhonchi Abdominal: Soft, NT, ND, bowel sounds + Extremities: no edema, no cyanosis   Discharge Instructions  Discharge Instructions    Diet - low sodium heart healthy   Complete by:  As directed    Increase activity slowly   Complete by:  As directed      Allergies as of 04/03/2018   No Known Allergies     Medication List    TAKE these medications   acetaminophen 325 MG tablet Commonly known as:  TYLENOL Take 2 tablets (650 mg total) by mouth every 6 (six)  hours as needed for mild pain (or Fever >/= 101).   atorvastatin 80 MG tablet Commonly known as:  LIPITOR Take 1 tablet (80 mg total) by mouth daily at 6 PM.   carvedilol 12.5 MG tablet Commonly known as:  COREG Take 1 tablet (12.5 mg total) by mouth 2 (two) times daily with a meal.   hydroxychloroquine 200 MG tablet Commonly known as:  PLAQUENIL Take 200 mg by mouth daily.   iron polysaccharides 150 MG capsule Commonly known as:  NIFEREX Take 1 capsule (150 mg total) by mouth daily. Start taking on:  April 04, 2018   lactose free nutrition Liqd Take 237 mLs by mouth 3 (three) times daily between meals.   levETIRAcetam 500 MG tablet Commonly known as:  KEPPRA TAKE 2 TABLETS BY MOUTH EVERY DAY   nitroGLYCERIN 0.4 MG SL tablet Commonly known as:  Nitrostat Place 1 tablet (0.4 mg total) under the tongue every 5 (five) minutes as needed. What changed:  reasons to take this   ondansetron 4 MG tablet Commonly known as:  ZOFRAN Take 1 tablet (4 mg total) by mouth every 6 (six) hours as needed for nausea.   pantoprazole 40 MG tablet Commonly known as:  PROTONIX Take 1 tablet (40 mg total) by mouth daily.   predniSONE 5 MG tablet Commonly known as:  DELTASONE Take 1 tablet (5 mg total) by mouth daily with breakfast.   ticagrelor 90 MG Tabs tablet Commonly known as:  BRILINTA Take 1 tablet (90 mg total) by mouth 2 (two) times daily.   Vitamin D (Ergocalciferol) 1.25 MG (50000 UT) Caps capsule Commonly known as:  DRISDOL Take 1 capsule (50,000 Units total) by mouth every 7 (seven) days.   Vitamin D 125 MCG (5000 UT) Caps Take 1 capsule by mouth daily. What changed:  how much to take      Contact information for after-discharge care    Destination    [facility] Preferred SNF .   Service:  Skilled Nursing Contact information: [address] [place] [phone]             No Known  Allergies   Procedures/Studies:   Dg Abd 1 View  Result Date: 04/02/2018 CLINICAL DATA:  Abdominal pain. EXAM: ABDOMEN - 1 VIEW COMPARISON:  CT of the abdomen and pelvis on 03/29/2018 and abdominal film on 03/02/2018 FINDINGS: No evidence of bowel obstruction, ileus or signs of free air. Status post cholecystectomy. Endoscopic hemostatic clips are present in the left upper quadrant in the region of the stomach. No abnormal calcifications. Visualized bony structures are unremarkable. IMPRESSION: No acute findings. Electronically Signed   By: Aletta Edouard M.D.   On: 04/02/2018 17:39   Dg Chest Port 1 View  Result Date: 04/02/2018 CLINICAL DATA:  Shortness of breath EXAM: PORTABLE CHEST 1 VIEW COMPARISON:  Four days  ago FINDINGS: Cardiomegaly. Negative aortic contours. Interstitial coarsening similar to prior, with cephalized blood flow. Probable small right pleural effusion creating asymmetric density at the right base. No pneumothorax. IMPRESSION: Cardiomegaly with pulmonary edema and small right pleural effusion. Electronically Signed   By: Monte Fantasia M.D.   On: 04/02/2018 09:24   Dg Chest Portable 1 View  Result Date: 03/29/2018 CLINICAL DATA:  Cough, shortness of breath. EXAM: PORTABLE CHEST 1 VIEW COMPARISON:  Radiograph of March 02, 2018. FINDINGS: The heart size and mediastinal contours are within normal limits. Both lungs are clear. No pneumothorax or pleural effusion is noted. The visualized skeletal structures are unremarkable. IMPRESSION: No active disease. Electronically Signed   By: Marijo Conception, M.D.   On: 03/29/2018 12:28   Ct Renal Stone Study  Result Date: 03/29/2018 CLINICAL DATA:  Right flank pain and dysuria since yesterday. History of renal calculi. 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 EXAM: CT ABDOMEN AND PELVIS WITHOUT CONTRAST TECHNIQUE: Multidetector CT  imaging of the abdomen and pelvis was performed following the standard protocol without IV contrast. COMPARISON:  Abdomen and pelvis radiograph dated 03/02/2018 and abdomen and pelvis CT dated 02/08/2009. FINDINGS: Lower chest: Small bilateral pleural effusions, larger on the right. Minimal bilateral dependent atelectasis. Minimal pericardial effusion with a maximum thickness of 3 mm. Hepatobiliary: No focal liver abnormality is seen. Status post cholecystectomy. No biliary dilatation. Pancreas: Unremarkable. No pancreatic ductal dilatation or surrounding inflammatory changes. Spleen: Normal in size without focal abnormality. Adrenals/Urinary Tract: Normal appearing adrenal glands. 2.5 cm left renal cyst. Somewhat small right kidney. Unremarkable ureters and urinary bladder. No urinary tract calculi or hydronephrosis. Stomach/Bowel: 2 metallic clips in the stomach. Similar-appearing metallic clip in the rectum posteriorly on the right, within a small amount of stool. Unremarkable small bowel and appendix. Vascular/Lymphatic: Atheromatous arterial calcifications without aneurysm. No enlarged lymph nodes. Reproductive: Status post hysterectomy. No adnexal masses. Other: Midline surgical scar. Musculoskeletal: Minimal lumbar and lower thoracic spine degenerative changes. IMPRESSION: 1. No urinary tract calculi or hydronephrosis. 2. Small bilateral pleural effusions, larger on the right. 3. Minimal pericardial effusion. 4. Mild to moderate right renal atrophy. 5. Two the previously placed gastric clips are in the stomach and one is in the rectum within a small amount of stool. Electronically Signed   By: Claudie Revering M.D.   On: 03/29/2018 14:20     The results of significant diagnostics from this hospitalization (including imaging, microbiology, ancillary and laboratory) are listed below for reference.     Microbiology: Recent Results (from the past 240 hour(s))  Urine culture     Status: None   Collection  Time: 03/29/18  1:33 PM  Result Value Ref Range Status   Specimen Description URINE, CATHETERIZED  Final   Special Requests NONE  Final   Culture   Final    NO GROWTH Performed at [facility], [address] [place], [place] [address]    Report Status 03/30/2018 FINAL  Final  Culture, blood (routine x 2)     Status: None (Preliminary result)   Collection Time: 04/02/18  8:10 AM  Result Value Ref Range Status   Specimen Description BLOOD RIGHT ANTECUBITAL  Final   Special Requests   Final    BOTTLES DRAWN AEROBIC AND ANAEROBIC Blood Culture adequate volume   Culture   Final    NO GROWTH 1 DAY Performed at [facility], [place] [address], [place], [place] [address]    Report Status PENDING  Incomplete  Culture, blood (routine x 2)     Status: None (Preliminary result)   Collection Time: 04/02/18  8:16 AM  Result Value Ref Range Status   Specimen Description BLOOD LEFT HAND  Final   Special Requests   Final    BOTTLES DRAWN AEROBIC AND ANAEROBIC Blood Culture adequate volume   Culture   Final    NO GROWTH 1 DAY Performed at [facility], [place] [address] [place], [place] [address]    Report Status PENDING  Incomplete     Labs: BNP (last 3 results) No results for input(s): BNP in the last 8760 hours. Basic Metabolic Panel: Recent Labs  Lab 03/31/18 0231 04/01/18 0219 04/02/18 0200 04/03/18 0221 04/03/18 1049  NA 142 141 140 139 138  K 3.3* 4.3 5.0 4.9 4.7  CL 119* 114* 110 110 107  CO2 16* 19* 22 22 24   GLUCOSE 161* 103* 81 75 93  BUN 13 10 11 15 17   CREATININE 1.39* 1.37* 1.38* 1.70* 1.59*  CALCIUM 6.7* 7.2* 8.1* 7.8* 8.0*  MG 1.0* 2.0 1.6* 2.0  --   PHOS 1.5* 3.2 3.5 3.9  --    Liver Function Tests: Recent Labs  Lab 03/29/18 1148 04/01/18 0219 04/02/18 0200 04/03/18 0221  AST 18 14* 17 15  ALT 8 7 9 8   ALKPHOS 89 80 89 83  BILITOT 1.1 0.4 0.5 0.5  PROT 7.3 6.2* 6.2* 6.0*  ALBUMIN 2.2* 1.7* 1.7* 1.7*   Recent Labs  Lab 03/29/18 1148   LIPASE 42   No results for input(s): AMMONIA in the last 168 hours. CBC: Recent Labs  Lab 03/29/18 1148  03/31/18 0231 04/01/18 0219 04/02/18 0200 04/03/18 0221 04/03/18 1049  WBC 16.7*   < > 12.8* 12.3* 16.7* 15.6* 15.7*  NEUTROABS 13.8*  --   --  9.0* 12.0* 11.4*  --   HGB 8.9*   < > 7.7* 9.1* 7.9* 7.1* 7.4*  HCT 29.1*   < > 23.7* 28.1* 25.3* 22.2* 23.5*  MCV 82.7   < > 80.1 79.6* 79.3* 80.4 79.4*  PLT 448*   < > 359 268 361 395 366   < > = values in this interval not displayed.   Cardiac Enzymes: No results for input(s): CKTOTAL, CKMB, CKMBINDEX, TROPONINI in the last 168 hours. BNP: Invalid input(s): POCBNP CBG: No results for input(s): GLUCAP in the last 168 hours. D-Dimer No results for input(s): DDIMER in the last 72 hours. Hgb A1c No results for input(s): HGBA1C in the last 72 hours. Lipid Profile No results for input(s): CHOL, HDL, LDLCALC, TRIG, CHOLHDL, LDLDIRECT in the last 72 hours. Thyroid function studies Recent Labs    04/01/18 0219  TSH 0.571   Anemia work up Recent Labs    04/01/18 0219 04/01/18 0743  VITAMINB12  --  670  FOLATE  --  3.7*  FERRITIN  --  224  TIBC  --  123*  IRON  --  9*  RETICCTPCT 1.5  --    Urinalysis    Component Value Date/Time   COLORURINE YELLOW 03/29/2018 1148   APPEARANCEUR HAZY (A) 03/29/2018 1148   LABSPEC 1.012 03/29/2018 1148   PHURINE 5.0 03/29/2018 1148   GLUCOSEU NEGATIVE 03/29/2018 1148   HGBUR SMALL (A) 03/29/2018 1148   BILIRUBINUR NEGATIVE 03/29/2018 1148   KETONESUR 20 (A) 03/29/2018 1148   PROTEINUR NEGATIVE 03/29/2018 1148  UROBILINOGEN 1.0 01/12/2014 2237   NITRITE NEGATIVE 03/29/2018 1148   LEUKOCYTESUR NEGATIVE 03/29/2018 1148   Sepsis Labs Invalid input(s): PROCALCITONIN,  WBC,  LACTICIDVEN Microbiology Recent Results (from the past 240 hour(s))  Urine culture     Status: None   Collection Time: 03/29/18  1:33 PM  Result Value Ref Range Status   Specimen Description URINE,  CATHETERIZED  Final   Special Requests NONE  Final   Culture   Final    NO GROWTH Performed at [facility], [place] [address], [place], [place] [address]    Report Status 03/30/2018 FINAL  Final  Culture, blood (routine x 2)     Status: None (Preliminary result)   Collection Time: 04/02/18  8:10 AM  Result Value Ref Range Status   Specimen Description BLOOD RIGHT ANTECUBITAL  Final   Special Requests   Final    BOTTLES DRAWN AEROBIC AND ANAEROBIC Blood Culture adequate volume   Culture   Final    NO GROWTH 1 DAY Performed at [facility], [place] [address], [place], [place] [address]    Report Status PENDING  Incomplete  Culture, blood (routine x 2)     Status: None (Preliminary result)   Collection Time: 04/02/18  8:16 AM  Result Value Ref Range Status   Specimen Description BLOOD LEFT HAND  Final   Special Requests   Final    BOTTLES DRAWN AEROBIC AND ANAEROBIC Blood Culture adequate volume   Culture   Final    NO GROWTH 1 DAY Performed at [facility], [place] [address], [place], [place] [address]    Report Status PENDING  Incomplete     Time coordinating discharge in minutes: 62  SIGNED:   Debbe Odea, MD  [facility] 04/03/2018, 1:29 PM Pager   If 7PM-7AM, please contact night-coverage [email or web address] Password [email or web address]

## 2018-04-03 NOTE — TOC Transition Note (Signed)
Transition of Care [facility]) - CM/SW Discharge Note   Patient Details  Name: [name] MRN: [id number] Date of Birth: [birth year]  Transition of Care [facility]) CM/SW Contact:  Geralynn Ochs, LCSW Phone Number: 04/03/2018, 3:07 PM   Clinical Narrative:   Nurse to call report to [phone], Room 125A.    Final next level of care: Skilled Nursing Facility Barriers to Discharge: No Barriers Identified   Patient Goals and CMS Choice Patient states their goals for this hospitalization and ongoing recovery are:: Patient wants to return  CMS Medicare.gov Compare Post Acute Care list provided to:: Patient Choice offered to / list presented to : Patient  Discharge Placement              Patient chooses bed at: [facility] Patient to be transferred to facility by: [place] Name of family member notified: John Patient and family notified of of transfer: 04/03/18  Discharge Plan and Services Discharge Planning Services: NA Post Acute Care Choice: [place]          DME Arranged: N/A DME Agency: NA HH Arranged: NA HH Agency: [place]   Social Determinants of Health (SDOH) Interventions     Readmission Risk Interventions Readmission Risk Prevention Plan 03/30/2018  Transportation Screening Complete  PCP or Specialist Appt within 5-7 Days Not Complete  Not Complete comments [place] Screening Not Complete  Medication Review (RN CM) Not Complete  Some recent data might be hidden

## 2018-04-03 NOTE — Progress Notes (Signed)
Occupational Therapy Treatment Patient Details Name: [name] MRN: [id number] DOB: 07-15-1940 Today's Date: 04/03/2018    History of present illness Pt is a 78 y.o. F with significant PMH of seizures, RA, and CKD presenting with SOB. Had stent placed about a month ago and has been mostly sedentary. Admitted with dehydration and failure to thrive.    OT comments  Pt performing bed mobility with modA; transfers with maxA and use of RW with max verbal cues for proper hand positioning. Pt stand pivot transfer from bed to recliner and requiring 3in1 for bowel movement. Pt dependent for LB ADL for toilet hygiene and maxA to remain standing. Pt with poor standing balance with RW and unable to stand  >1 min at a time. Pt with increased lethargy today and falling backwards when sitting upright. Pt maxA to scoot to back of recliner at this time. Pt would greatly benefit from continued OT skilled services for ADL, mobility and safety in SNF setting. OT to follow acutely.    Follow Up Recommendations  SNF;Supervision/Assistance - 24 hour    Equipment Recommendations       Recommendations for Other Services      Precautions / Restrictions Precautions Precautions: Fall Precaution Comments: bowel and bladder incontinence Restrictions Weight Bearing Restrictions: No       Mobility Bed Mobility Overal bed mobility: Needs Assistance Bed Mobility: Supine to Sit     Supine to sit: Mod assist     General bed mobility comments: mod A to bring trunk and legs to EOB  Transfers Overall transfer level: Needs assistance Equipment used: Rolling walker (2 wheeled) Transfers: Sit to/from Stand           General transfer comment: MaxA for sit to stand and transfers from bed to recliner and recliner to 3in1.    Balance Overall balance assessment: Needs assistance Sitting-balance support: Bilateral upper extremity supported;Feet supported Sitting balance-Leahy Scale: Poor Sitting balance  - Comments: intermittent posterior lean Postural control: Posterior lean Standing balance support: Single extremity supported;During functional activity Standing balance-Leahy Scale: Poor Standing balance comment: requires reminders for proper hand placement.               High Level Balance Comments: very poor balance and inability to follow commands as BLEs very weak unable to take steps other than sliding foot           ADL either performed or assessed with clinical judgement   ADL Overall ADL's : Needs assistance/impaired Eating/Feeding: Set up                   Lower Body Dressing: Moderate assistance;Sitting/lateral leans;Sit to/from stand   Toilet Transfer: Moderate assistance;Stand-pivot;RW           Functional mobility during ADLs: Moderate assistance;Rolling walker General ADL Comments: MaxA for all transfers and mobility with RW     Vision   Vision Assessment?: No apparent visual deficits   Perception     Praxis      Cognition Arousal/Alertness: Lethargic;Suspect due to medications Behavior During Therapy: Flat affect Overall Cognitive Status: Impaired/Different from baseline Area of Impairment: Following commands;Problem solving                               General Comments: slow processing. unsure what baselinse is.         Exercises     Shoulder Instructions       General  Comments Pt with increased lethargy today and unable to stand with out prematurely sitting down.    Pertinent Vitals/ Pain       Pain Assessment: No/denies pain  Home Living                                          Prior Functioning/Environment              Frequency  Min 2X/week        Progress Toward Goals  OT Goals(current goals can now be found in the care plan section)  Progress towards OT goals: Progressing toward goals  Acute Rehab OT Goals Patient Stated Goal: to go home OT Goal Formulation: With  patient Time For Goal Achievement: 04/13/18 Potential to Achieve Goals: [place] Discharge plan remains appropriate    Co-evaluation                 AM-PAC OT "6 Clicks" Daily Activity     Outcome Measure   Help from another person eating meals?: None Help from another person taking care of personal grooming?: A Little Help from another person toileting, which includes using toliet, bedpan, or urinal?: A Little Help from another person bathing (including washing, rinsing, drying)?: A Lot Help from another person to put on and taking off regular upper body clothing?: A Little Help from another person to put on and taking off regular lower body clothing?: A Lot 6 Click Score: 17    End of Session Equipment Utilized During Treatment: Gait belt;Rolling walker  OT Visit Diagnosis: Unsteadiness on feet (R26.81);Other abnormalities of gait and mobility (R26.89);Muscle weakness (generalized) (M62.81);Adult, failure to thrive (R62.7)   Activity Tolerance Patient tolerated treatment well;Patient limited by lethargy   Patient Left in chair;with call bell/phone within reach;with chair alarm set   Nurse Communication Mobility status        Time: 6754-4920 OT Time Calculation (min): 33 min  Charges: OT General Charges $OT Visit: 1 Visit OT Treatments $Self Care/Home Management : 8-22 mins $Neuromuscular Re-education: 8-22 mins  Darryl Nestle) Marsa Aris OTR/L Acute Rehabilitation Services Pager: [phone] Office: [phone]   Fredda Hammed 04/03/2018, 2:05 PM

## 2018-04-05 ENCOUNTER — Other Ambulatory Visit: Payer: Self-pay

## 2018-04-05 DIAGNOSIS — D472 Monoclonal gammopathy: Secondary | ICD-10-CM

## 2018-04-05 NOTE — Telephone Encounter (Signed)
LMVM for patient regarding rescheduling her 3/23 appt per 3/16 [place] letter/calendar mailed

## 2018-04-07 LAB — CULTURE, BLOOD (ROUTINE X 2)
Culture: NO GROWTH
Culture: NO GROWTH
Special Requests: ADEQUATE
Special Requests: ADEQUATE

## 2018-04-08 ENCOUNTER — Ambulatory Visit: Payer: Medicare PPO | Admitting: Physician Assistant

## 2018-04-08 ENCOUNTER — Ambulatory Visit: Payer: Medicare PPO | Admitting: Hematology & Oncology

## 2018-04-08 ENCOUNTER — Other Ambulatory Visit: Payer: Medicare PPO

## 2018-04-08 ENCOUNTER — Ambulatory Visit: Payer: Self-pay

## 2018-04-08 DIAGNOSIS — I2111 ST elevation (STEMI) myocardial infarction involving right coronary artery: Secondary | ICD-10-CM

## 2018-04-08 DIAGNOSIS — M069 Rheumatoid arthritis, unspecified: Secondary | ICD-10-CM

## 2018-04-08 DIAGNOSIS — N183 Chronic kidney disease, stage 3 unspecified: Secondary | ICD-10-CM

## 2018-04-08 NOTE — Chronic Care Management (AMB) (Signed)
Chronic Care Management    Clinical Social Work Follow Up Note  04/08/2018 Name: [name] MRN: [id number] DOB: 23-Jul-1940  [name] is a 78 y.o. year old female who is a primary care patient of Glendale Chard, MD. The CCM team was consulted for assistance with [employer].   Review of patient status, including review of consultants reports, other relevant assessments, and collaboration with appropriate care team members and the patient's provider was performed as part of comprehensive patient evaluation and provision of chronic care management services.    I outreached the patients spouse, [name] on today's date to follow up on the patients current disposition. The patient continues to reside at [facility] for rehab. Mr. Felmlee reports the patient plans to return home in the next 5-7 days. It is reported the patients grand-children may be available to assist with care needs approximately 3 days per week. SW introduced the concept of Palliative Care to Mr. Renier during today's call as requested by patients primary care physician Dr. Baird Cancer. Mr. Dicola reports he will discuss Palliative Care concept with the patient. Mr. Din is agreeable to SW outreaching the patient in the next week to continue Advance Care Planning discussion.  In collaboration with CCM RN CM a comprehensive care plan has been established and updated to assist with patient community resource needs.   Goals Addressed            This Visit's Progress     Patient Stated   . "I need a ramp" (pt-stated)   On track    Current Barriers:  . Financial constraints . Lacks knowledge of community resource: to assist with ramp installation  Clinical Social Work Clinical Goal(s):  [name] Over the next 30 days, client will work with SW to address concerns related to the ability to exit her home safely  Interventions: . SW confirmed patient referral was received by [employer] as well as  Production designer, [employer]tele[employer] . SW educated the patients spouse he would receive a call by these agencies in the future to conduct a screening call  Patient Self Care Activities:  . Currently UNABLE TO independently exit her home due to concerns with mobility. The patient relies on her spouse and grand-children for assistance  Plan:  . Social Worker will outreach the patient and her spouse in the next month to assess the progress of this goal  Please see past updates related to this goal by clicking on the "Past Updates" button in the selected goal      . "I want meals on wheels" (pt-stated)   On track    Current Barriers:  . Limited access to caregiver . Inability to perform ADL's independently . Inability to perform IADL's independently . Lacks knowledge of community resource: [employer] through [employer] [facility] [place]  Clinical Social Work Clinical Goal(s):  [name] Over the next 30 days, client will work with SW to address concerns related to mobile meals  Interventions:  Referral sent to [employer] [facility] [place] for [employer].   SW provided the patients spouse with an update regarding the wait list to this resource  SW educated the patients spouse on the patients [employer] Well[employer]ine benefit upon returning home from current SNF stay  Patient Self Care Activities:  . Currently UNABLE TO independently prepare her own meals  Plan:  . Social Worker will follow up with the patient and her spouse in the next week to assess patients disposition  .  SW will assist with ordering Well Dine meals once the patient returns home from SNF  Please see past updates related to this goal by clicking on the "Past Updates" button in the selected goal          Follow Up Plan: Appointment scheduled for SW follow up with client by phone on: Monday March 30.   [name], BSW, CDP [facility] Care Management Social Worker [phone]  Total time spent performing care coordination and/or  care management activities with the patient by phone or face to face = 20 minutes.

## 2018-04-08 NOTE — Patient Instructions (Signed)
Social Worker Visit Information  Goals we discussed today:  Goals Addressed            This Visit's Progress     Patient Stated   . "I need a ramp" (pt-stated)   On track    Current Barriers:  . Financial constraints . Lacks knowledge of community resource: to assist with ramp installation  Clinical Social Work Clinical Goal(s):  [name] Over the next 30 days, client will work with SW to address concerns related to the ability to exit her home safely  Interventions: . SW confirmed patient referral was received by [employer] as well as Production designer, theatre/television/film . SW educated the patients spouse he would receive a call by these agencies in the future to conduct a screening call  Patient Self Care Activities:  . Currently UNABLE TO independently exit her home due to concerns with mobility. The patient relies on her spouse and grand-children for assistance  Plan:  . Social Worker will outreach the patient and her spouse in the next month to assess the progress of this goal  Please see past updates related to this goal by clicking on the "Past Updates" button in the selected goal      . "I want meals on wheels" (pt-stated)   On track    Current Barriers:  . Limited access to caregiver . Inability to perform ADL's independently . Inability to perform IADL's independently . Lacks knowledge of community resource: [employer] through [employer] [facility] [place]  Clinical Social Work Clinical Goal(s):  [name] Over the next 30 days, client will work with SW to address concerns related to mobile meals  Interventions:  Referral sent to [employer] [facility] [place] for [employer].   SW provided the patients spouse with an update regarding the wait list to this resource  SW educated the patients spouse on the patients [employer] WellDine benefit upon returning home from current SNF stay  Patient Self Care Activities:  . Currently UNABLE TO independently prepare her own  meals  Plan:  . Social Worker will follow up with the patient and her spouse in the next week to assess patients disposition  . SW will assist with ordering Well Dine meals once the patient returns home from SNF  Please see past updates related to this goal by clicking on the "Past Updates" button in the selected goal          Materials Provided: Verbal education about Palliative Care provided by phone  Follow Up Plan: Appointment scheduled for SW follow up with client by phone on: Monday March 30   [name], [place], CDP [facility] Social Worker [phone]

## 2018-04-09 ENCOUNTER — Telehealth: Payer: Self-pay

## 2018-04-09 DIAGNOSIS — R627 Adult failure to thrive: Secondary | ICD-10-CM | POA: Diagnosis not present

## 2018-04-09 DIAGNOSIS — R630 Anorexia: Secondary | ICD-10-CM | POA: Diagnosis not present

## 2018-04-09 DIAGNOSIS — F332 Major depressive disorder, recurrent severe without psychotic features: Secondary | ICD-10-CM | POA: Diagnosis not present

## 2018-04-09 DIAGNOSIS — E43 Unspecified severe protein-calorie malnutrition: Secondary | ICD-10-CM | POA: Diagnosis not present

## 2018-04-11 NOTE — Chronic Care Management (AMB) (Addendum)
    Chronic Care Management   Initial Visit Note  03/26/2018 Name: [name] MRN: [id number] DOB: July 03, 1940  Referred by: Glendale Chard, MD Reason for referral : Care Coordination   [name] is a 78 y.o. year old female who is a primary care patient of Glendale Chard, MD. The CCM team was consulted for assistance with chronic disease management and care coordination needs.   Review of patient status, including review of consultants reports, relevant laboratory and other test results, and collaboration with appropriate care team members and the patient's provider was performed as part of comprehensive patient evaluation and provision of chronic care management services.    I initiated and established the plan of care for [name] during one on one collaboration with my clinical care management colleague [name] BSW who is also engaged with this patient to address social work needs.    Other   . Assist patient/spouse with disease management and care coordination needs       Current Barriers:   Ineffective Self Health Maintenance  Knowledge Deficits related to short term plan for care coordination needs and long term plans for chronic disease management needs  Nurse Case Manager Clinical Goal(s):   Over the next 90 days, patient will work with care management team to address care coordination and chronic disease management needs related to Disease Management; Care Coordination; Medication Management and Education; Psychosocial Support; Caregiver Stress support; and Level of Care Concerns.     Interventions:   One on one collaboration establish plan of care to address care coordination and disease management needs with clinical colleague [name] BSW.   Patient Self Care Activities:   Currently UNABLE TO independently self manage needs related to chronic health conditions.   Initial goal documentation     Follow Up Plan: Face to Face appointment  with CCM team member scheduled for: April 13. SW will continue to work on goals and update the patient via phone as warranted. The patient will outreach SW if assistance is needed prior to the next scheduled call.     [name], RN,CCM Care Management Coordinator [place] [facility]  Direct Phone: [phone]

## 2018-04-15 ENCOUNTER — Ambulatory Visit: Payer: Self-pay

## 2018-04-15 ENCOUNTER — Other Ambulatory Visit: Payer: Self-pay

## 2018-04-15 DIAGNOSIS — N183 Chronic kidney disease, stage 3 unspecified: Secondary | ICD-10-CM

## 2018-04-15 DIAGNOSIS — M069 Rheumatoid arthritis, unspecified: Secondary | ICD-10-CM

## 2018-04-15 DIAGNOSIS — I2111 ST elevation (STEMI) myocardial infarction involving right coronary artery: Secondary | ICD-10-CM

## 2018-04-15 NOTE — Patient Instructions (Signed)
Social Worker Visit Information  Goals we discussed today:  Goals Addressed            This Visit's Progress     Patient Stated   . "I need a ramp" (pt-stated)       Current Barriers:  . Financial constraints . Lacks knowledge of community resource: to assist with ramp installation  Clinical Social Work Clinical Goal(s):  [name] Over the next 30 days, client will work with SW to address concerns related to the ability to exit her home safely  Interventions: . SW contacted the patients spouse to determine the outcome of recent referrals . SW provided Mr. Viar with the contact number to [facility] as he reports being contacted while at work and being unable to complete telephone screen . SW contacted [place] with [facility] via secure e-mail to provide Mr. Souter works schedule for future calls  Patient Self Care Activities:  . Currently UNABLE TO independently exit her home due to concerns with mobility. The patient relies on her spouse and grand-children for assistance  Plan:  . Mr. Tirey will contact Michel Harrow to complete screening call   Please see past updates related to this goal by clicking on the "Past Updates" button in the selected goal          Materials Provided: Verbal education about [employer] Well [employer]ine benefit provided by phone  Follow Up Plan: Appointment scheduled for SW follow up with client by phone on: April 9th.   [name], BSW, CDP [facility] Care Management Social Worker [phone]

## 2018-04-15 NOTE — Chronic Care Management (AMB) (Signed)
  Chronic Care Management    Clinical Social Work Follow Up Note  04/15/2018 Name: [name] MRN: [id number] DOB: [birth year]  [name] is a 78 y.o. year old female who is a primary care patient of Glendale Chard, MD. The CCM team was consulted for assistance with chronic disease management and care coordination.  Review of patient status, including review of consultants reports, other relevant assessments, and collaboration with appropriate care team members and the patient's provider was performed as part of comprehensive patient evaluation and provision of chronic care management services.     I outreached the patients caregiver/spouse [name] on today's date whom the patient has given verbal permission for this SW to speak with. HIPAA identifiers confirmed. It is reported the patient remains in rehab at SNF with plans to return home "sometime around April 8th". [name] reports the patient is "doing a lot better".    Goals Addressed            This Visit's Progress     Patient Stated   . "I need a ramp" (pt-stated)       Current Barriers:  . Financial constraints . Lacks knowledge of community resource: to assist with ramp installation  Clinical Social Work Clinical Goal(s):  [name] Over the next 30 days, client will work with SW to address concerns related to the ability to exit her home safely  Interventions: . SW contacted the patients spouse to determine the outcome of recent referrals . SW provided [name] with the contact number to [facility] as he reports being contacted while at work and being unable to complete telephone screen . SW contacted [place] with [facility] via secure e-mail to provide [name] works schedule for future calls  Patient Self Care Activities:  . Currently UNABLE TO independently exit her home due to concerns with mobility. The patient relies on her spouse and grand-children for  assistance  Plan:  . [name] will contact [name] to complete screening call   Please see past updates related to this goal by clicking on the "Past Updates" button in the selected goal          Follow Up Plan: Appointment scheduled for SW follow up with client by phone on: April 9th. SW will plan to assist with ordering meals through [facility] Well Dine benefit.   [name], BSW, CDP [facility] / [facility] Care Management Social Worker [phone]  Total time spent performing care coordination and/or care management activities with the patient by phone or face to face = 15 minutes.

## 2018-04-19 DIAGNOSIS — F331 Major depressive disorder, recurrent, moderate: Secondary | ICD-10-CM | POA: Diagnosis not present

## 2018-04-21 ENCOUNTER — Emergency Department (HOSPITAL_COMMUNITY): Payer: Medicare PPO

## 2018-04-21 ENCOUNTER — Encounter (HOSPITAL_COMMUNITY): Payer: Self-pay | Admitting: Emergency Medicine

## 2018-04-21 ENCOUNTER — Inpatient Hospital Stay (HOSPITAL_COMMUNITY)
Admission: EM | Admit: 2018-04-21 | Discharge: 2018-04-26 | DRG: 291 | Disposition: A | Discharge status: Skilled Nursing Facility | Payer: Medicare PPO | Source: Skilled Nursing Facility | Attending: Internal Medicine | Admitting: Internal Medicine

## 2018-04-21 ENCOUNTER — Inpatient Hospital Stay (HOSPITAL_COMMUNITY): Payer: Medicare PPO

## 2018-04-21 DIAGNOSIS — Z20828 Contact with and (suspected) exposure to other viral communicable diseases: Secondary | ICD-10-CM | POA: Diagnosis not present

## 2018-04-21 DIAGNOSIS — I5033 Acute on chronic diastolic (congestive) heart failure: Secondary | ICD-10-CM | POA: Diagnosis not present

## 2018-04-21 DIAGNOSIS — R5381 Other malaise: Secondary | ICD-10-CM | POA: Diagnosis not present

## 2018-04-21 DIAGNOSIS — I5031 Acute diastolic (congestive) heart failure: Secondary | ICD-10-CM | POA: Diagnosis not present

## 2018-04-21 DIAGNOSIS — G40909 Epilepsy, unspecified, not intractable, without status epilepticus: Secondary | ICD-10-CM | POA: Diagnosis not present

## 2018-04-21 DIAGNOSIS — M069 Rheumatoid arthritis, unspecified: Secondary | ICD-10-CM | POA: Diagnosis present

## 2018-04-21 DIAGNOSIS — N179 Acute kidney failure, unspecified: Secondary | ICD-10-CM | POA: Diagnosis present

## 2018-04-21 DIAGNOSIS — J9601 Acute respiratory failure with hypoxia: Secondary | ICD-10-CM | POA: Diagnosis not present

## 2018-04-21 DIAGNOSIS — R451 Restlessness and agitation: Secondary | ICD-10-CM | POA: Diagnosis present

## 2018-04-21 DIAGNOSIS — E7800 Pure hypercholesterolemia, unspecified: Secondary | ICD-10-CM | POA: Diagnosis present

## 2018-04-21 DIAGNOSIS — Z8042 Family history of malignant neoplasm of prostate: Secondary | ICD-10-CM | POA: Diagnosis not present

## 2018-04-21 DIAGNOSIS — Z9049 Acquired absence of other specified parts of digestive tract: Secondary | ICD-10-CM

## 2018-04-21 DIAGNOSIS — Z87891 Personal history of nicotine dependence: Secondary | ICD-10-CM | POA: Diagnosis not present

## 2018-04-21 DIAGNOSIS — R6889 Other general symptoms and signs: Secondary | ICD-10-CM

## 2018-04-21 DIAGNOSIS — M6281 Muscle weakness (generalized): Secondary | ICD-10-CM | POA: Diagnosis not present

## 2018-04-21 DIAGNOSIS — G9341 Metabolic encephalopathy: Secondary | ICD-10-CM | POA: Diagnosis present

## 2018-04-21 DIAGNOSIS — R0689 Other abnormalities of breathing: Secondary | ICD-10-CM | POA: Diagnosis not present

## 2018-04-21 DIAGNOSIS — N182 Chronic kidney disease, stage 2 (mild): Secondary | ICD-10-CM | POA: Diagnosis not present

## 2018-04-21 DIAGNOSIS — I130 Hypertensive heart and chronic kidney disease with heart failure and stage 1 through stage 4 chronic kidney disease, or unspecified chronic kidney disease: Secondary | ICD-10-CM | POA: Diagnosis not present

## 2018-04-21 DIAGNOSIS — Z7952 Long term (current) use of systemic steroids: Secondary | ICD-10-CM | POA: Diagnosis not present

## 2018-04-21 DIAGNOSIS — R0902 Hypoxemia: Secondary | ICD-10-CM

## 2018-04-21 DIAGNOSIS — Z781 Physical restraint status: Secondary | ICD-10-CM | POA: Diagnosis not present

## 2018-04-21 DIAGNOSIS — Z90710 Acquired absence of both cervix and uterus: Secondary | ICD-10-CM | POA: Diagnosis not present

## 2018-04-21 DIAGNOSIS — J810 Acute pulmonary edema: Secondary | ICD-10-CM | POA: Diagnosis not present

## 2018-04-21 DIAGNOSIS — Z20822 Contact with and (suspected) exposure to covid-19: Secondary | ICD-10-CM | POA: Diagnosis present

## 2018-04-21 DIAGNOSIS — I252 Old myocardial infarction: Secondary | ICD-10-CM | POA: Diagnosis not present

## 2018-04-21 DIAGNOSIS — Z79899 Other long term (current) drug therapy: Secondary | ICD-10-CM

## 2018-04-21 DIAGNOSIS — Z0189 Encounter for other specified special examinations: Secondary | ICD-10-CM

## 2018-04-21 DIAGNOSIS — R404 Transient alteration of awareness: Secondary | ICD-10-CM | POA: Diagnosis not present

## 2018-04-21 DIAGNOSIS — Z9911 Dependence on respirator [ventilator] status: Secondary | ICD-10-CM | POA: Diagnosis not present

## 2018-04-21 DIAGNOSIS — J90 Pleural effusion, not elsewhere classified: Secondary | ICD-10-CM | POA: Diagnosis not present

## 2018-04-21 DIAGNOSIS — R4020 Unspecified coma: Secondary | ICD-10-CM | POA: Diagnosis not present

## 2018-04-21 DIAGNOSIS — I10 Essential (primary) hypertension: Secondary | ICD-10-CM | POA: Diagnosis not present

## 2018-04-21 DIAGNOSIS — D631 Anemia in chronic kidney disease: Secondary | ICD-10-CM | POA: Diagnosis not present

## 2018-04-21 DIAGNOSIS — R627 Adult failure to thrive: Secondary | ICD-10-CM | POA: Diagnosis present

## 2018-04-21 DIAGNOSIS — N183 Chronic kidney disease, stage 3 (moderate): Secondary | ICD-10-CM | POA: Diagnosis not present

## 2018-04-21 DIAGNOSIS — Z955 Presence of coronary angioplasty implant and graft: Secondary | ICD-10-CM | POA: Diagnosis not present

## 2018-04-21 DIAGNOSIS — J9690 Respiratory failure, unspecified, unspecified whether with hypoxia or hypercapnia: Secondary | ICD-10-CM | POA: Diagnosis present

## 2018-04-21 DIAGNOSIS — Z4659 Encounter for fitting and adjustment of other gastrointestinal appliance and device: Secondary | ICD-10-CM | POA: Diagnosis not present

## 2018-04-21 DIAGNOSIS — Z7401 Bed confinement status: Secondary | ICD-10-CM | POA: Diagnosis not present

## 2018-04-21 DIAGNOSIS — G4089 Other seizures: Secondary | ICD-10-CM | POA: Diagnosis not present

## 2018-04-21 DIAGNOSIS — E43 Unspecified severe protein-calorie malnutrition: Secondary | ICD-10-CM | POA: Diagnosis not present

## 2018-04-21 DIAGNOSIS — R918 Other nonspecific abnormal finding of lung field: Secondary | ICD-10-CM | POA: Diagnosis not present

## 2018-04-21 DIAGNOSIS — I2510 Atherosclerotic heart disease of native coronary artery without angina pectoris: Secondary | ICD-10-CM | POA: Diagnosis present

## 2018-04-21 DIAGNOSIS — G9340 Encephalopathy, unspecified: Secondary | ICD-10-CM | POA: Diagnosis not present

## 2018-04-21 DIAGNOSIS — R0602 Shortness of breath: Secondary | ICD-10-CM | POA: Diagnosis not present

## 2018-04-21 DIAGNOSIS — M2550 Pain in unspecified joint: Secondary | ICD-10-CM | POA: Diagnosis not present

## 2018-04-21 LAB — STREP PNEUMONIAE URINARY ANTIGEN: Strep Pneumo Urinary Antigen: NEGATIVE

## 2018-04-21 LAB — COMPREHENSIVE METABOLIC PANEL WITH GFR
ALT: 23 U/L (ref 0–44)
AST: 49 U/L — ABNORMAL HIGH (ref 15–41)
Albumin: 2.6 g/dL — ABNORMAL LOW (ref 3.5–5.0)
Alkaline Phosphatase: 110 U/L (ref 38–126)
Anion gap: 12 (ref 5–15)
BUN: 33 mg/dL — ABNORMAL HIGH (ref 8–23)
CO2: 16 mmol/L — ABNORMAL LOW (ref 22–32)
Calcium: 7.9 mg/dL — ABNORMAL LOW (ref 8.9–10.3)
Chloride: 114 mmol/L — ABNORMAL HIGH (ref 98–111)
Creatinine, Ser: 1.75 mg/dL — ABNORMAL HIGH (ref 0.44–1.00)
GFR calc Af Amer: 32 mL/min — ABNORMAL LOW
GFR calc non Af Amer: 27 mL/min — ABNORMAL LOW
Glucose, Bld: 207 mg/dL — ABNORMAL HIGH (ref 70–99)
Potassium: 5.1 mmol/L (ref 3.5–5.1)
Sodium: 142 mmol/L (ref 135–145)
Total Bilirubin: 0.6 mg/dL (ref 0.3–1.2)
Total Protein: 6.7 g/dL (ref 6.5–8.1)

## 2018-04-21 LAB — CBC WITH DIFFERENTIAL/PLATELET
Abs Immature Granulocytes: 1.16 K/uL — ABNORMAL HIGH (ref 0.00–0.07)
Basophils Absolute: 0.1 K/uL (ref 0.0–0.1)
Basophils Relative: 1 %
Eosinophils Absolute: 0.6 K/uL — ABNORMAL HIGH (ref 0.0–0.5)
Eosinophils Relative: 3 %
HCT: 32.6 % — ABNORMAL LOW (ref 36.0–46.0)
Hemoglobin: 9.8 g/dL — ABNORMAL LOW (ref 12.0–15.0)
Immature Granulocytes: 6 %
Lymphocytes Relative: 23 %
Lymphs Abs: 4.5 K/uL — ABNORMAL HIGH (ref 0.7–4.0)
MCH: 26.9 pg (ref 26.0–34.0)
MCHC: 30.1 g/dL (ref 30.0–36.0)
MCV: 89.6 fL (ref 80.0–100.0)
Monocytes Absolute: 1.4 K/uL — ABNORMAL HIGH (ref 0.1–1.0)
Monocytes Relative: 7 %
Neutro Abs: 12.1 K/uL — ABNORMAL HIGH (ref 1.7–7.7)
Neutrophils Relative %: 60 %
Platelets: 414 K/uL — ABNORMAL HIGH (ref 150–400)
RBC: 3.64 MIL/uL — ABNORMAL LOW (ref 3.87–5.11)
RDW: 20 % — ABNORMAL HIGH (ref 11.5–15.5)
WBC: 19.8 K/uL — ABNORMAL HIGH (ref 4.0–10.5)
nRBC: 0.2 % (ref 0.0–0.2)

## 2018-04-21 LAB — URINALYSIS, COMPLETE (UACMP) WITH MICROSCOPIC
Bacteria, UA: NONE SEEN
Bilirubin Urine: NEGATIVE
Glucose, UA: NEGATIVE mg/dL
Ketones, ur: NEGATIVE mg/dL
Nitrite: NEGATIVE
Protein, ur: NEGATIVE mg/dL
Specific Gravity, Urine: 1.005 (ref 1.005–1.030)
pH: 6 (ref 5.0–8.0)

## 2018-04-21 LAB — POCT I-STAT 7, (LYTES, BLD GAS, ICA,H+H)
Acid-base deficit: 13 mmol/L — ABNORMAL HIGH (ref 0.0–2.0)
Acid-base deficit: 8 mmol/L — ABNORMAL HIGH (ref 0.0–2.0)
Bicarbonate: 15.5 mmol/L — ABNORMAL LOW (ref 20.0–28.0)
Bicarbonate: 19 mmol/L — ABNORMAL LOW (ref 20.0–28.0)
Calcium, Ion: 1.18 mmol/L (ref 1.15–1.40)
Calcium, Ion: 1.19 mmol/L (ref 1.15–1.40)
HCT: 28 % — ABNORMAL LOW (ref 36.0–46.0)
HCT: 30 % — ABNORMAL LOW (ref 36.0–46.0)
Hemoglobin: 9.5 g/dL — ABNORMAL LOW (ref 12.0–15.0)
Hemoglobin: 10.2 g/dL — ABNORMAL LOW (ref 12.0–15.0)
O2 Saturation: 88 %
O2 Saturation: 99 %
Patient temperature: 91.6
Patient temperature: 91.6
Potassium: 4.9 mmol/L (ref 3.5–5.1)
Potassium: 4.2 mmol/L (ref 3.5–5.1)
Sodium: 144 mmol/L (ref 135–145)
Sodium: 145 mmol/L (ref 135–145)
TCO2: 17 mmol/L — ABNORMAL LOW (ref 22–32)
TCO2: 20 mmol/L — ABNORMAL LOW (ref 22–32)
pCO2 arterial: 37.2 mmHg (ref 32.0–48.0)
pCO2 arterial: 39 mmHg (ref 32.0–48.0)
pH, Arterial: 7.206 — ABNORMAL LOW (ref 7.350–7.450)
pH, Arterial: 7.274 — ABNORMAL LOW (ref 7.350–7.450)
pO2, Arterial: 54 mmHg — ABNORMAL LOW (ref 83.0–108.0)
pO2, Arterial: 174 mmHg — ABNORMAL HIGH (ref 83.0–108.0)

## 2018-04-21 LAB — RESPIRATORY PANEL BY PCR

## 2018-04-21 LAB — D-DIMER, QUANTITATIVE: D-Dimer, Quant: 15.88 ug/mL-FEU — ABNORMAL HIGH (ref 0.00–0.50)

## 2018-04-21 LAB — GLUCOSE, CAPILLARY
Glucose-Capillary: 104 mg/dL — ABNORMAL HIGH (ref 70–99)
Glucose-Capillary: 123 mg/dL — ABNORMAL HIGH (ref 70–99)
Glucose-Capillary: 50 mg/dL — ABNORMAL LOW (ref 70–99)
Glucose-Capillary: 86 mg/dL (ref 70–99)

## 2018-04-21 LAB — MRSA PCR SCREENING: MRSA by PCR: NEGATIVE

## 2018-04-21 LAB — BRAIN NATRIURETIC PEPTIDE: B Natriuretic Peptide: 585.7 pg/mL — ABNORMAL HIGH (ref 0.0–100.0)

## 2018-04-21 LAB — LACTIC ACID, PLASMA: Lactic Acid, Venous: 5 mmol/L (ref 0.5–1.9)

## 2018-04-21 LAB — CBG MONITORING, ED
Glucose-Capillary: 64 mg/dL — ABNORMAL LOW (ref 70–99)
Glucose-Capillary: 137 mg/dL — ABNORMAL HIGH (ref 70–99)

## 2018-04-21 LAB — TROPONIN I: Troponin I: <0.03 ng/mL (ref <0.03)

## 2018-04-21 LAB — TRIGLYCERIDES: Triglycerides: 42 mg/dL (ref <150)

## 2018-04-21 LAB — PROCALCITONIN: Procalcitonin: <0.1 ng/mL

## 2018-04-21 MED ORDER — ETOMIDATE 2 MG/ML IV SOLN
INTRAVENOUS | Status: AC | PRN
  Administered 2018-04-21: 20 mg via INTRAVENOUS

## 2018-04-21 MED ORDER — INSULIN ASPART 100 UNIT/ML ~~LOC~~ SOLN
0.0000 [IU] | SUBCUTANEOUS | Status: DC
  Administered 2018-04-23: 01:00:00 2 [IU] via SUBCUTANEOUS
  Administered 2018-04-23: 03:00:00 1 [IU] via SUBCUTANEOUS

## 2018-04-21 MED ORDER — SODIUM CHLORIDE 0.9 % IV SOLN
1.0000 g | Freq: Once | INTRAVENOUS | Status: AC
  Administered 2018-04-21: 1 g via INTRAVENOUS
  Filled 2018-04-21: qty 1

## 2018-04-21 MED ORDER — NITROGLYCERIN IN D5W 200-5 MCG/ML-% IV SOLN
0.0000 ug/min | INTRAVENOUS | Status: DC
  Administered 2018-04-21: 07:00:00 50 ug/min via INTRAVENOUS
  Filled 2018-04-21: qty 250

## 2018-04-21 MED ORDER — MIDAZOLAM HCL 2 MG/2ML IJ SOLN
1.0000 mg | INTRAMUSCULAR | Status: DC | PRN
  Administered 2018-04-23: 2 mg via INTRAVENOUS
  Filled 2018-04-21 (×2): qty 2

## 2018-04-21 MED ORDER — DEXTROSE 50 % IV SOLN
INTRAVENOUS | Status: AC
  Filled 2018-04-21: qty 50

## 2018-04-21 MED ORDER — FAMOTIDINE 20 MG IN NS 100 ML IVPB
20.0000 mg | INTRAVENOUS | Status: DC
  Administered 2018-04-22 – 2018-04-23 (×2): 20 mg via INTRAVENOUS
  Filled 2018-04-21 (×3): qty 100

## 2018-04-21 MED ORDER — DEXTROSE 50 % IV SOLN
1.0000 | Freq: Once | INTRAVENOUS | Status: AC
  Administered 2018-04-21: 15:00:00 50 mL via INTRAVENOUS

## 2018-04-21 MED ORDER — ENOXAPARIN SODIUM 30 MG/0.3ML ~~LOC~~ SOLN
30.0000 mg | SUBCUTANEOUS | Status: DC
  Administered 2018-04-21 – 2018-04-25 (×5): 30 mg via SUBCUTANEOUS
  Filled 2018-04-21 (×6): qty 0.3

## 2018-04-21 MED ORDER — FUROSEMIDE 10 MG/ML IJ SOLN
80.0000 mg | Freq: Three times a day (TID) | INTRAMUSCULAR | Status: DC
  Administered 2018-04-21 – 2018-04-23 (×6): 80 mg via INTRAVENOUS
  Filled 2018-04-21 (×5): qty 8

## 2018-04-21 MED ORDER — NOREPINEPHRINE 4 MG/250ML-% IV SOLN
INTRAVENOUS | Status: AC
  Filled 2018-04-21: qty 250

## 2018-04-21 MED ORDER — DEXTROSE 50 % IV SOLN
25.0000 mL | Freq: Once | INTRAVENOUS | Status: AC
  Administered 2018-04-21: 20:00:00 25 mL via INTRAVENOUS

## 2018-04-21 MED ORDER — FENTANYL CITRATE (PF) 100 MCG/2ML IJ SOLN: 50.0000 ug | Freq: Once | INTRAMUSCULAR | Status: DC

## 2018-04-21 MED ORDER — FENTANYL 2500MCG IN NS 250ML (10MCG/ML) PREMIX INFUSION
25.0000 ug/h | INTRAVENOUS | Status: DC
  Administered 2018-04-21 (×2): 50 ug/h via INTRAVENOUS
  Filled 2018-04-21 (×2): qty 250

## 2018-04-21 MED ORDER — ENOXAPARIN SODIUM 30 MG/0.3ML ~~LOC~~ SOLN: 30.0000 mg | SUBCUTANEOUS | Status: DC

## 2018-04-21 MED ORDER — FUROSEMIDE 10 MG/ML IJ SOLN
80.0000 mg | Freq: Once | INTRAMUSCULAR | Status: AC
  Administered 2018-04-21: 80 mg via INTRAVENOUS
  Filled 2018-04-21: qty 8

## 2018-04-21 MED ORDER — VANCOMYCIN HCL IN DEXTROSE 1-5 GM/200ML-% IV SOLN
1000.0000 mg | INTRAVENOUS | Status: DC
  Filled 2018-04-21: qty 200

## 2018-04-21 MED ORDER — MORPHINE SULFATE (PF) 2 MG/ML IV SOLN: 1.0000 mg | INTRAVENOUS | Status: DC | PRN

## 2018-04-21 MED ORDER — VANCOMYCIN HCL 10 G IV SOLR
1250.0000 mg | Freq: Once | INTRAVENOUS | Status: AC
  Administered 2018-04-21: 1250 mg via INTRAVENOUS
  Filled 2018-04-21: qty 1250

## 2018-04-21 MED ORDER — DOCUSATE SODIUM 50 MG/5ML PO LIQD
100.0000 mg | Freq: Two times a day (BID) | ORAL | Status: DC | PRN
  Administered 2018-04-21: 100 mg
  Filled 2018-04-21: qty 10

## 2018-04-21 MED ORDER — CHLORHEXIDINE GLUCONATE 0.12% ORAL RINSE (MEDLINE KIT)
15.0000 mL | Freq: Two times a day (BID) | OROMUCOSAL | Status: DC
  Administered 2018-04-21 – 2018-04-26 (×8): 15 mL via OROMUCOSAL

## 2018-04-21 MED ORDER — NOREPINEPHRINE 4 MG/250ML-% IV SOLN
0.0000 ug/min | INTRAVENOUS | Status: DC
  Administered 2018-04-21: 19:00:00 2 ug/min via INTRAVENOUS
  Administered 2018-04-22: 6 ug/min via INTRAVENOUS
  Filled 2018-04-21: qty 250

## 2018-04-21 MED ORDER — FENTANYL BOLUS VIA INFUSION
25.0000 ug | INTRAVENOUS | Status: DC | PRN
  Filled 2018-04-21: qty 25

## 2018-04-21 MED ORDER — SODIUM CHLORIDE 0.9 % IV SOLN
1.0000 g | INTRAVENOUS | Status: DC
  Administered 2018-04-22: 09:00:00 1 g via INTRAVENOUS
  Filled 2018-04-21 (×2): qty 1

## 2018-04-21 MED ORDER — FAMOTIDINE 40 MG/5ML PO SUSR
20.0000 mg | Freq: Every day | ORAL | Status: DC
  Filled 2018-04-21: qty 2.5

## 2018-04-21 MED ORDER — DEXTROSE-NACL 5-0.9 % IV SOLN
INTRAVENOUS | Status: DC
  Administered 2018-04-21: 17:00:00 1 mL via INTRAVENOUS
  Administered 2018-04-22: 20:00:00 via INTRAVENOUS

## 2018-04-21 MED ORDER — PROPOFOL 1000 MG/100ML IV EMUL
INTRAVENOUS | Status: AC
  Administered 2018-04-21: 45 ug/kg/min
  Filled 2018-04-21: qty 100

## 2018-04-21 MED ORDER — PROPOFOL 1000 MG/100ML IV EMUL
5.0000 ug/kg/min | INTRAVENOUS | Status: DC
  Administered 2018-04-21: 18:00:00 30 ug/kg/min via INTRAVENOUS
  Administered 2018-04-22: 12:00:00 15 ug/kg/min via INTRAVENOUS
  Administered 2018-04-22: 30 ug/kg/min via INTRAVENOUS
  Administered 2018-04-22: 22:00:00 20 ug/kg/min via INTRAVENOUS
  Filled 2018-04-21 (×3): qty 100

## 2018-04-21 MED ORDER — INSULIN ASPART 100 UNIT/ML ~~LOC~~ SOLN: 0.0000 [IU] | Freq: Three times a day (TID) | SUBCUTANEOUS | Status: DC

## 2018-04-21 MED ORDER — ROCURONIUM BROMIDE 50 MG/5ML IV SOLN
INTRAVENOUS | Status: AC | PRN
  Administered 2018-04-21: 75 mg via INTRAVENOUS

## 2018-04-21 MED ORDER — PROPOFOL 1000 MG/100ML IV EMUL
INTRAVENOUS | Status: AC
  Administered 2018-04-21: 5 ug/kg/min
  Filled 2018-04-21: qty 100

## 2018-04-21 MED ORDER — SODIUM CHLORIDE 0.9 % IV SOLN: 1.0000 g | INTRAVENOUS | Status: DC

## 2018-04-21 MED ORDER — SODIUM CHLORIDE 0.9 % IV SOLN: 1.0000 g | Freq: Two times a day (BID) | INTRAVENOUS | Status: DC

## 2018-04-21 MED ORDER — ORAL CARE MOUTH RINSE
15.0000 mL | OROMUCOSAL | Status: DC
  Administered 2018-04-21 – 2018-04-23 (×17): 15 mL via OROMUCOSAL

## 2018-04-21 MED ORDER — FAMOTIDINE 20 MG IN NS 100 ML IVPB: 20.0000 mg | INTRAVENOUS | Status: DC

## 2018-04-21 NOTE — ED Notes (Signed)
OG tube output appears to be dark ?blood. CCMD paged.

## 2018-04-21 NOTE — ED Notes (Addendum)
Pt is biting the tube, coughing, and moving her limbs. Titrated the propofol and blood pressure dropped. Stopped nitro drip.

## 2018-04-21 NOTE — ED Notes (Signed)
Bair hugger applied.

## 2018-04-21 NOTE — H&P (Addendum)
NAME:  [name], MRN:  [id number], DOB:  [birth year], LOS: 0 ADMISSION DATE:  04/21/2018, CONSULTATION DATE:  04/21/2018  REFERRING MD:  ER doc, CHIEF COMPLAINT:  resp failure intubated  -snf resident, rule out covid -68 but low suspicison   Brief History   See hpi  History of present illness    78 year old lady.  History is gained from review of the chart and talking to referring emergency department physician.  She was admitted in February 2020 with inferior wall myocardial infarction and coronary artery disease status post right coronary artery stenting.  She does not have rheumatoid arthritis on treatment, seizures, hypertension, hyperlipidemia, chronic kidney disease [creatinine at discharge was 1.6 mg percent and through the hospital was between 1.4-1.7 mg percent].  She was admitted for 6 days in the middle of March 2020 [March 29, 2018-  April 03, 2018] for dehydration and failure to thrive.  During this admission she was flu PCR negative.  At that point in time she was living at home.  DNR status was recommended but family rejected.  Patient was discharged to a nursing home.  She represented to the emergency department from the skilled nursing facility after being found unresponsive and hypoxemic.  In the emergency department chest x-ray suggested pulmonary edema and she was hypertensive.  She was intubated.  She has been on propofol drip and nitroglycerin drip.  Her blood labs resulted showing a high BNP of 500s, low procalcitonin normal less than 0.1, normal lymphocytes.  Given the ongoing pandemic and reports of coronavirus COVID-19 outbreak and skilled nursing facilities she has been determined in the ER to be a rule out covid-19  Echocardiogram February 2020 with ejection fraction greater than 65% but asymmetric wall motion abnormalities present.  Past Medical History     has a past medical history of Acute kidney injury superimposed on chronic kidney disease ([place]) (02/06/2015),  Anxiety, CAD (coronary artery disease) (02/28/2018), Depression, DVT (deep venous thrombosis) ([place]) (1970s), Heart murmur, Hypercholesterolemia, Hypertension, Kidney stones, Migraine, Multiple falls, Pneumonia ("several times"), Rheumatoid arthritis(714.0), Seizure ([place]), Syncope and collapse, and Vision abnormalities.   reports that she quit smoking about 3 months ago. Her smoking use included cigarettes. She has a 62.00 pack-year smoking history. She has never used smokeless tobacco.  Past Surgical History:  Procedure Laterality Date   ABDOMINAL HYSTERECTOMY     CARPAL TUNNEL RELEASE Bilateral    CATARACT EXTRACTION W/ INTRAOCULAR LENS  IMPLANT, BILATERAL Bilateral    CORONARY/GRAFT ACUTE MI REVASCULARIZATION N/A 02/28/2018   Procedure: Coronary/Graft Acute MI Revascularization;  Surgeon: Jettie Booze, MD;  Location: [facility];  Service: Cardiovascular;  Laterality: N/A;   ESOPHAGOGASTRODUODENOSCOPY N/A 03/05/2018   Procedure: ESOPHAGOGASTRODUODENOSCOPY (EGD);  Surgeon: Ladene Artist, MD;  Location: [facility];  Service: Endoscopy;  Laterality: N/A;   LEFT HEART CATH AND CORONARY ANGIOGRAPHY N/A 02/28/2018   Procedure: LEFT HEART CATH AND CORONARY ANGIOGRAPHY;  Surgeon: Jettie Booze, MD;  Location: [facility];  Service: Cardiovascular;  Laterality: N/A;   NECK EXPLORATION  07/16/2015   PARATHYROIDECTOMY Right 07/16/2015   superior   PARATHYROIDECTOMY Right 07/16/2015   Procedure: RIGHT INFERIOR PARATHYROIDECTOMY;  Surgeon: Armandina Gemma, MD;  Location: [place];  Service: General;  Laterality: Right;   SUBMUCOSAL INJECTION  03/05/2018   Procedure: SUBMUCOSAL INJECTION;  Surgeon: Ladene Artist, MD;  Location: [facility];  Service: Endoscopy;;   TOENAIL AVULSION Bilateral    great toe    No Known Allergies  There is no immunization history on file for this patient.  Family History  Problem Relation Age of Onset   Cancer Father    Prostate  cancer Father    Cancer Brother    Heart disease Brother    Miscarriages / Stillbirths Brother    Heart disease Mother    Pneumonia Mother      Current Facility-Administered Medications:    ceFEPIme (MAXIPIME) 1 g in sodium chloride 0.9 % 100 mL IVPB, 1 g, Intravenous, Once, Mesner, Corene Cornea, MD   nitroGLYCERIN 50 mg in dextrose 5 % 250 mL (0.2 mg/mL) infusion, 0-200 mcg/min, Intravenous, Continuous, Mesner, Corene Cornea, MD, Last Rate: 15 mL/hr at 04/21/18 0648, 50 mcg/min at 04/21/18 0648   vancomycin (VANCOCIN) 1,250 mg in sodium chloride 0.9 % 250 mL IVPB, 1,250 mg, Intravenous, Once, Mesner, Corene Cornea, MD  Current Outpatient Medications:    acetaminophen (TYLENOL) 325 MG tablet, Take 2 tablets (650 mg total) by mouth every 6 (six) hours as needed for mild pain (or Fever >/= 101)., Disp: , Rfl:    atorvastatin (LIPITOR) 80 MG tablet, Take 1 tablet (80 mg total) by mouth daily at 6 PM., Disp: 30 tablet, Rfl: 6   carvedilol (COREG) 12.5 MG tablet, Take 1 tablet (12.5 mg total) by mouth 2 (two) times daily with a meal., Disp: 60 tablet, Rfl: 6   hydroxychloroquine (PLAQUENIL) 200 MG tablet, Take 200 mg by mouth daily. , Disp: , Rfl: 0   iron polysaccharides (NIFEREX) 150 MG capsule, Take 1 capsule (150 mg total) by mouth daily., Disp: , Rfl:    lactose free nutrition (BOOST PLUS) LIQD, Take 237 mLs by mouth 3 (three) times daily between meals., Disp: , Rfl: 0   levETIRAcetam (KEPPRA) 500 MG tablet, TAKE 2 TABLETS BY MOUTH EVERY DAY (Patient taking differently: Take 1,000 mg by mouth daily. ), Disp: 180 tablet, Rfl: 0   mirtazapine (REMERON) 15 MG tablet, Take 15 mg by mouth at bedtime., Disp: , Rfl:    nitroGLYCERIN (NITROSTAT) 0.4 MG SL tablet, Place 1 tablet (0.4 mg total) under the tongue every 5 (five) minutes as needed. (Patient taking differently: Place 0.4 mg under the tongue every 5 (five) minutes as needed for chest pain. ), Disp: 25 tablet, Rfl: 12   ondansetron (ZOFRAN) 4 MG  tablet, Take 1 tablet (4 mg total) by mouth every 6 (six) hours as needed for nausea., Disp: 20 tablet, Rfl: 0   pantoprazole (PROTONIX) 40 MG tablet, Take 1 tablet (40 mg total) by mouth daily., Disp: 30 tablet, Rfl: 6   predniSONE (DELTASONE) 5 MG tablet, Take 1 tablet (5 mg total) by mouth daily with breakfast., Disp: 30 tablet, Rfl: 0   ticagrelor (BRILINTA) 90 MG TABS tablet, Take 1 tablet (90 mg total) by mouth 2 (two) times daily., Disp: 60 tablet, Rfl: 11   UNABLE TO FIND, Take 120 mLs by mouth 3 (three) times daily. Med Name: Med Plus 2.0, Disp: , Rfl:    Vitamin D, Ergocalciferol, (DRISDOL) 50000 units CAPS capsule, Take 1 capsule (50,000 Units total) by mouth every 7 (seven) days., Disp: , Rfl:    Cholecalciferol (VITAMIN D) 125 MCG (5000 UT) CAPS, Take 1 capsule by mouth daily. (Patient not taking: Reported on 04/21/2018), Disp: 30 capsule, Rfl: [place] [facility] Events   04/21/2018  - admit, intubated in ER  Consults:  04/21/2018 - ccm consult  Procedures:  ETT in ER 04/21/2018   Significant Diagnostic Tests:  x  Micro Data:  covid-19 pcr -   Antimicrobials:  04/21/2018  - vanc 04/21/2018 - cefepime   Interim history/subjective:  04/21/2018 - intubated in ER  Objective   Blood pressure (!) 143/98, pulse (!) 113, temperature (!) 91.6 F (33.1 C), temperature source Rectal, resp. rate 20, weight 60 kg, SpO2 97 %.    Vent Mode: PRVC FiO2 (%):  [40 %-100 %] 100 % Set Rate:  [20 bmp] 20 bmp Vt Set:  [380 mL] 380 mL PEEP:  [5 cmH20] 5 cmH20 Plateau Pressure:  [11 cmH20-17 cmH20] 11 cmH20  No intake or output data in the 24 hours ending 04/21/18 0904 Filed Weights   04/21/18 0622  Weight: 60 kg    Examination: General: Frail elderly female in the emergency department trauma bed A .  HENT: Intubated.  Has thyroidectomy scar Lungs: Bilateral crackles in synchrony with the ventilator.  FiO2 80% and PEEP of 5 Cardiovascular: Hypertensive normal heart  sounds Abdomen: Soft nontender.  Has a lower abdominal midline incision Extremities: No cyanosis no clubbing no edema.  Dry skin Neuro: RA SS score -3 with dipper Lucianne Lei GU: External genitalia looks intact.  Foley catheter being placed.    LABS    PULMONARY Recent Labs  Lab 04/21/18 0636 04/21/18 0754  PHART 7.206* 7.274*  PCO2ART 37.2 39.0  PO2ART 54.0* 174.0*  HCO3 15.5* 19.0*  TCO2 17* 20*  O2SAT 88.0 99.0    CBC Recent Labs  Lab 04/21/18 0627 04/21/18 0636 04/21/18 0754  HGB 9.8* 9.5* 10.2*  HCT 32.6* 28.0* 30.0*  WBC 19.8*  --   --   PLT 414*  --   --     COAGULATION No results for input(s): INR in the last 168 hours.  CARDIAC   Recent Labs  Lab 04/21/18 0627  TROPONINI <0.03   No results for input(s): PROBNP in the last 168 hours.   CHEMISTRY Recent Labs  Lab 04/21/18 0627 04/21/18 0636 04/21/18 0754  NA 142 144 145  K 5.1 4.9 4.2  CL 114*  --   --   CO2 16*  --   --   GLUCOSE 207*  --   --   BUN 33*  --   --   CREATININE 1.75*  --   --   CALCIUM 7.9*  --   --    Estimated Creatinine Clearance: 21.7 mL/min (A) (by C-G formula based on SCr of 1.75 mg/dL (H)).   LIVER Recent Labs  Lab 04/21/18 0627  AST 49*  ALT 23  ALKPHOS 110  BILITOT 0.6  PROT 6.7  ALBUMIN 2.6*     INFECTIOUS Recent Labs  Lab 04/21/18 0627  LATICACIDVEN 5.0*  PROCALCITON <0.10     ENDOCRINE CBG (last 3)  No results for input(s): GLUCAP in the last 72 hours.       IMAGING x48h  - image(s) personally visualized  -   highlighted in bold Dg Chest Portable 1 View  Result Date: 04/21/2018 CLINICAL DATA:  Shortness of breath.  Respiratory distress. EXAM: PORTABLE CHEST 1 VIEW COMPARISON:  04/02/2018 FINDINGS: Endotracheal tube tip is just above the carina. Retraction by 5.5 cm would place it at the level of the clavicular heads. Orogastric tube tip and side port are beyond the field of view. Interstitial opacities in the right lung have worsened since  the prior study. Left lung is clear. IMPRESSION: 1. Endotracheal tube tip just above the carina. Recommend retraction by 3-5 cm. 2. Markedly worsened interstitial opacities within the right lung  which may indicate infection or asymmetric pulmonary edema. Electronically Signed   By: Ulyses Jarred M.D.   On: 04/21/2018 06:45     Resolved Hospital Problem list   x  Assessment & Plan:  ASSESSMENT / PLAN:  PULMONARY A:  Acute hypoxemic respiratory failure with infiltrates -most likely pulmonary edema in the setting of hypertension and high BNP in the 500s.  Given pandemic setting coronavirus is a possibility but low probably    P:   Full ventilator support VAP precautions  CARDIAC A: History of MI and right coronary stenting in February 2020  P: Rule out MI - reheck trop 04/22/18 - wotn be a good repeat cath candiate Get echocardiogram depending in course and bnp/trop results and response to lasix Lasix first before interventions  VASCULAR A:   Hypertensive  P:  Nitroglycerin drip and propofol for sedation  RENAL A:  Chronic kidney disease with baseline creatinine between 1.4 mg percent and 1.7 mg percent At admission creatinine 1.75 mg percent  P:  Monitor avoid nephrotoxic drugs  ELECTROLYTES A:  At risk for electrolyte abnormalities P: Monitor closely   GASTROINTESTINAL A:   No history of GI illnesses  P:   N.p.o. OG tube  HEMATOLOGIC A:  Anemia of chronic disease   P:  - PRBC for hgb </= 6.9gm%    - exceptions are   -  if ACS susepcted/confirmed then transfuse for hgb </= 8.0gm%,  or    -  active bleeding with hemodynamic instability, then transfuse regardless of hemoglobin value   At at all times try to transfuse 1 unit prbc as possible with exception of active hemorrhage    INFECTIOUS A:   Normal procalcitonin argues against bacterial infection Respiratory viral infection including coronavirus COVID-19 a possibility but low but not ruled  out [d-dimer is greater than 1, normal lymphocytes, normal procalcitonin high BNP]  P:   Check urine strep Check urine Legionella Respiratory virus panel COVID-19 panel Hold off antibiotics Not a good candidate for actmiera if declines Hold off plaquaneil   ENDOCRINE A:   At risk hyperglycemia P:   Phase 1 ICU hyperglycemia protocol  NEUROLOGIC A:   Sedated on ventilator P:   RSS score 0 to -1 [place]  GLOBAL Admit to the ICU   Best practice:  Diet: N.p.o., start tube feeds April 22, 2018 Pain/Anxiety/Delirium protocol (if indicated): Propofol wit morphine as needed  VAP protocol (if indicated): Head of bed greater than 30 degrees DVT prophylaxis: Heparin GI prophylaxis: Protonix Glucose control: SSI Mobility: Bedrest Code Status: d./w Husband and agreed for Limited code Family Communication: Husband Disposition:    ATTESTATION & SIGNATURE   The patient [name] is critically ill with multiple organ systems failure and requires high complexity decision making for assessment and support, frequent evaluation and titration of therapies, application of advanced monitoring technologies and extensive interpretation of multiple databases.   Critical Care Time devoted to patient care services described in this note is  60  Minutes. This time reflects time of care of this signee Dr Brand Males. This critical care time does not reflect procedure time, or teaching time or supervisory time of PA/NP/Med student/Med Resident etc but could involve care discussion time     Dr. Brand Males, M.D., [facility].C.P Pulmonary and Critical Care Medicine Staff Physician [facility] Pager: [phone], If no answer or between  15:00h - 7:00h: call [phone]  04/21/2018  9:04 AM

## 2018-04-21 NOTE — ED Notes (Signed)
Pt became hypotensive with systolic in the 47'H. Propofol paused.

## 2018-04-21 NOTE — ED Triage Notes (Signed)
Pt BIB [facility] from [facility], staff reports they checked on pt and she appeared at her baseline, re-checked later pt, pt altered from baseline and short of breath. Pt being bagged by EMS on arrival to ED.

## 2018-04-21 NOTE — ED Provider Notes (Signed)
[facility] EMERGENCY DEPARTMENT Provider Note   CSN: [id number] Arrival date & time: 04/21/18  0607    History   Chief Complaint Chief Complaint  Patient presents with  . Altered Mental Status    HPI [name] is a 78 y.o. female.     Supposedly normal then found unresponsive and hypoxic. No other history.   EMS showed HTN, tachycardia, O2 in 60's and unresponsive. Same here.      Past Medical History:  Diagnosis Date  . Acute kidney injury superimposed on chronic kidney disease ([place]) 02/06/2015  . Anxiety   . CAD (coronary artery disease) 02/28/2018   s/p stent  . Depression   . DVT (deep venous thrombosis) (HCC) 1970s   LLE  . Heart murmur   . Hypercholesterolemia   . Hypertension   . Kidney stones   . Migraine    "a few/year" (07/16/2015)  . Multiple falls   . Pneumonia "several times"  . Rheumatoid arthritis(714.0)    "all over" (07/16/2015)  . Seizure [facility])    "last one was 1//2017" (07/16/2015)  . Syncope and collapse   . Vision abnormalities     Patient Active Problem List   Diagnosis Date Noted  . Protein-calorie malnutrition, severe 03/31/2018  . Dehydration 03/29/2018  . Failure to thrive in adult 03/29/2018  . Acute blood loss anemia   . Occult blood in stools   . Gastric AVM   . STEMI (ST elevation myocardial infarction) ([place]) 02/28/2018  . Acute inferior myocardial infarction ([place]) 02/28/2018  . Respiratory arrest ([place])   . Essential hypertension   . Respiratory failure ([place])   . SIRS (systemic inflammatory response syndrome) ([place]) 08/11/2016  . Nausea vomiting and diarrhea 08/11/2016  . Renal failure (ARF), acute on chronic (HCC) 08/11/2016  . High risk medications (not anticoagulants) long-term use 04/20/2016  . Bilateral chronic knee pain 04/20/2016  . Pain in left elbow 04/20/2016  . Chronic kidney disease, stage III (moderate) ([place]) 08/03/2015  . Encounter for general adult medical examination without  abnormal findings 08/03/2015  . 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 Other long term (current) drug therapy 08/03/2015  . Pain in right shoulder 08/03/2015  . Postmenopausal bleeding 08/03/2015  . Vitamin D deficiency 08/03/2015  . Localized pain of right shoulder joint 08/03/2015  . Primary hyperparathyroidism ([place]) 07/16/2015  . Hyperparathyroidism, primary ([place]) 07/14/2015  . Gait disturbance 03/24/2015  . Neck pain 03/24/2015  . Myalgia and myositis 03/24/2015  . Acute kidney injury superimposed on chronic kidney disease ([place]) 02/06/2015  . Leukocytosis 02/06/2015  . Seizure ([place]) 02/05/2015  . Acute encephalopathy 02/05/2015  . Abnormal MRI of head 02/02/2014  . Cognitive changes 02/02/2014  . Migraine without aura and without status migrainosus, not intractable 02/02/2014  . LOC (loss of consciousness) ([place])   . Syncope 01/12/2014  . Syncope and collapse 01/12/2014  . Hypertensive urgency 01/12/2014  . Rheumatoid arthritis ([place]) 01/12/2014  . Depression 01/12/2014  . Hyperlipidemia 01/12/2014    Past Surgical History:  Procedure Laterality Date  . ABDOMINAL HYSTERECTOMY    . CARPAL TUNNEL RELEASE Bilateral   . CATARACT EXTRACTION W/ INTRAOCULAR LENS  IMPLANT, BILATERAL Bilateral   . CORONARY/GRAFT ACUTE MI REVASCULARIZATION N/A 02/28/2018   Procedure: Coronary/Graft Acute MI Revascularization;  Surgeon: Jettie Booze, MD;  Location: [facility] LAB;  Service: Cardiovascular;  Laterality: N/A;  . ESOPHAGOGASTRODUODENOSCOPY N/A  03/05/2018   Procedure: ESOPHAGOGASTRODUODENOSCOPY (EGD);  Surgeon: Ladene Artist, MD;  Location: [facility] ENDOSCOPY;  Service: Endoscopy;  Laterality: N/A;  . LEFT HEART CATH AND CORONARY ANGIOGRAPHY N/A 02/28/2018   Procedure: LEFT HEART CATH AND CORONARY ANGIOGRAPHY;  Surgeon: Jettie Booze, MD;  Location: [facility];  Service:  Cardiovascular;  Laterality: N/A;  . NECK EXPLORATION  07/16/2015  . PARATHYROIDECTOMY Right 07/16/2015   superior  . PARATHYROIDECTOMY Right 07/16/2015   Procedure: RIGHT INFERIOR PARATHYROIDECTOMY;  Surgeon: Armandina Gemma, MD;  Location: [facility];  Service: General;  Laterality: Right;  . SUBMUCOSAL INJECTION  03/05/2018   Procedure: SUBMUCOSAL INJECTION;  Surgeon: Ladene Artist, MD;  Location: [facility] ENDOSCOPY;  Service: Endoscopy;;  . TOENAIL AVULSION Bilateral    great toe     OB History   No obstetric history on file.      Home Medications    Prior to Admission medications   Medication Sig Start Date End Date Taking? Authorizing Provider  acetaminophen (TYLENOL) 325 MG tablet Take 2 tablets (650 mg total) by mouth every 6 (six) hours as needed for mild pain (or Fever >/= 101). 04/03/18  Yes Debbe Odea, MD  atorvastatin (LIPITOR) 80 MG tablet Take 1 tablet (80 mg total) by mouth daily at 6 PM. 03/08/18  Yes Bhagat, Bhavinkumar, PA  carvedilol (COREG) 12.5 MG tablet Take 1 tablet (12.5 mg total) by mouth 2 (two) times daily with a meal. 03/08/18  Yes Bhagat, Bhavinkumar, PA  hydroxychloroquine (PLAQUENIL) 200 MG tablet Take 200 mg by mouth daily.  08/02/15  Yes [provider]  iron polysaccharides (NIFEREX) 150 MG capsule Take 1 capsule (150 mg total) by mouth daily. 04/04/18  Yes Debbe Odea, MD  lactose free nutrition (BOOST PLUS) LIQD Take 237 mLs by mouth 3 (three) times daily between meals. 04/03/18  Yes Debbe Odea, MD  levETIRAcetam (KEPPRA) 500 MG tablet TAKE 2 TABLETS BY MOUTH EVERY DAY Patient taking differently: Take 1,000 mg by mouth daily.  03/22/18  Yes [place] Chard, MD  mirtazapine (REMERON) 15 MG tablet Take 15 mg by mouth at bedtime.   Yes [provider]  nitroGLYCERIN (NITROSTAT) 0.4 MG SL tablet Place 1 tablet (0.4 mg total) under the tongue every 5 (five) minutes as needed. Patient taking differently: Place 0.4 mg under the tongue every 5 (five)  minutes as needed for chest pain.  03/08/18  Yes Jettie Booze, MD  ondansetron (ZOFRAN) 4 MG tablet Take 1 tablet (4 mg total) by mouth every 6 (six) hours as needed for nausea. 04/03/18  Yes Debbe Odea, MD  pantoprazole (PROTONIX) 40 MG tablet Take 1 tablet (40 mg total) by mouth daily. 03/09/18  Yes Bhagat, Bhavinkumar, PA  predniSONE (DELTASONE) 5 MG tablet Take 1 tablet (5 mg total) by mouth daily with breakfast. 04/01/18  Yes Deveshwar, Abel Presto, MD  ticagrelor (BRILINTA) 90 MG TABS tablet Take 1 tablet (90 mg total) by mouth 2 (two) times daily. 03/08/18  Yes Bhagat, Bhavinkumar, PA  UNABLE TO FIND Take 120 mLs by mouth 3 (three) times daily. Med Name: Med Plus 2.0   Yes [provider]  Vitamin D, Ergocalciferol, (DRISDOL) 50000 units CAPS capsule Take 1 capsule (50,000 Units total) by mouth every 7 (seven) days. 08/14/16  Yes Hongalgi, Lenis Dickinson, MD  Cholecalciferol (VITAMIN D) 125 MCG (5000 UT) CAPS Take 1 capsule by mouth daily. Patient not taking: Reported on 04/21/2018 03/26/18   [place] Chard, MD    Family History Family  History  Problem Relation Age of Onset  . Cancer Father   . Prostate cancer Father   . Cancer Brother   . Heart disease Brother   . Miscarriages / [place] Brother   . Heart disease Mother   . Pneumonia Mother     Social History Social History   Tobacco Use  . Smoking status: Former Smoker    Packs/day: 1.00    Years: 62.00    Pack years: 62.00    Types: Cigarettes    Last attempt to quit: 01/2018    Years since quitting: 0.2  . Smokeless tobacco: Never Used  Substance Use Topics  . Alcohol use: No  . Drug use: No     Allergies   Patient has no known allergies.   Review of Systems Review of Systems  Unable to perform ROS: Patient unresponsive     Physical Exam Updated Vital Signs BP (!) 144/97   Pulse (!) 129   Temp (!) 91.6 F (33.1 C) (Rectal)   Resp 20   Wt 60 kg   SpO2 99%   BMI 24.99 kg/m   Physical Exam  Vitals signs and nursing note reviewed.  Constitutional:      General: She is in acute distress.     Appearance: She is ill-appearing and diaphoretic.  HENT:     Head: Atraumatic.     Nose: No rhinorrhea.     Mouth/Throat:     Pharynx: No oropharyngeal exudate or posterior oropharyngeal erythema.  Eyes:     Conjunctiva/sclera: Conjunctivae normal.     Pupils: Pupils are equal, round, and reactive to light.  Neck:     Musculoskeletal: Normal range of motion.  Cardiovascular:     Rate and Rhythm: Tachycardia present.  Pulmonary:     Effort: Tachypnea present.     Breath sounds: Decreased breath sounds (R>L) and rales present.  Abdominal:     General: Abdomen is flat.  Musculoskeletal:        General: No swelling.  Skin:    Coloration: Skin is not jaundiced.     Findings: No lesion.     Comments: cool      ED Treatments / Results  Labs (all labs ordered are listed, but only abnormal results are displayed) Labs Reviewed  CBC WITH DIFFERENTIAL/PLATELET - Abnormal; Notable for the following components:      Result Value   WBC 19.8 (*)    RBC 3.64 (*)    Hemoglobin 9.8 (*)    HCT 32.6 (*)    RDW 20.0 (*)    Platelets 414 (*)    All other components within normal limits  LACTIC ACID, PLASMA - Abnormal; Notable for the following components:   Lactic Acid, Venous 5.0 (*)    All other components within normal limits  POCT I-STAT 7, (LYTES, BLD GAS, ICA,H+H) - Abnormal; Notable for the following components:   pH, Arterial 7.206 (*)    pO2, Arterial 54.0 (*)    Bicarbonate 15.5 (*)    TCO2 17 (*)    Acid-base deficit 13.0 (*)    HCT 28.0 (*)    Hemoglobin 9.5 (*)    All other components within normal limits  CULTURE, BLOOD (ROUTINE X 2)  CULTURE, BLOOD (ROUTINE X 2)  RESPIRATORY PANEL BY PCR  COMPREHENSIVE METABOLIC PANEL  TROPONIN I  BRAIN NATRIURETIC PEPTIDE  LACTIC ACID, PLASMA  D-DIMER, QUANTITATIVE (NOT AT [facility])  PROCALCITONIN  LEGIONELLA PNEUMOPHILA SEROGP 1  UR AG  STREP PNEUMONIAE  URINARY ANTIGEN    EKG EKG Interpretation  Date/Time:  Sunday April 21 2018 06:10:48 EDT Ventricular Rate:  102 PR Interval:    QRS Duration: 78 QT Interval:  328 QTC Calculation: 428 R Axis:   48 Text Interpretation:  Sinus tachycardia Borderline low voltage, extremity leads Abnormal R-wave progression, early transition Minimal ST depression, anterolateral leads ST depression in V4 appears slightly worsened compared to 3/13 Confirmed by Merrily Pew [phone]) on 04/21/2018 6:42:12 AM   Radiology Dg Chest Portable 1 View  Result Date: 04/21/2018 CLINICAL DATA:  Shortness of breath.  Respiratory distress. EXAM: PORTABLE CHEST 1 VIEW COMPARISON:  04/02/2018 FINDINGS: Endotracheal tube tip is just above the carina. Retraction by 5.5 cm would place it at the level of the clavicular heads. Orogastric tube tip and side port are beyond the field of view. Interstitial opacities in the right lung have worsened since the prior study. Left lung is clear. IMPRESSION: 1. Endotracheal tube tip just above the carina. Recommend retraction by 3-5 cm. 2. Markedly worsened interstitial opacities within the right lung which may indicate infection or asymmetric pulmonary edema. Electronically Signed   By: Ulyses Jarred M.D.   On: 04/21/2018 06:45    Procedures .Critical Care Performed by: Merrily Pew, MD Authorized by: Merrily Pew, MD   Critical care provider statement:    Critical care time (minutes):  45   Critical care was necessary to treat or prevent imminent or life-threatening deterioration of the following conditions:  CNS failure or compromise, respiratory failure, circulatory failure and cardiac failure   Critical care was time spent personally by me on the following activities:  Discussions with consultants, evaluation of patient's response to treatment, examination of patient, ordering and performing treatments and interventions, ordering and review of laboratory  studies, ordering and review of radiographic studies, pulse oximetry, re-evaluation of patient's condition, obtaining history from patient or surrogate and review of old charts Procedure Name: Intubation Date/Time: 04/21/2018 7:46 AM Performed by: Merrily Pew, MD Pre-anesthesia Checklist: Patient identified, Patient being monitored, Emergency Drugs available, Timeout performed and Suction available Oxygen Delivery Method: Non-rebreather mask Preoxygenation: Pre-oxygenation with 100% oxygen Induction Type: Rapid sequence Ventilation: Mask ventilation without difficulty Laryngoscope Size: Mac and 4 Grade View: Grade I Tube size: 7.0 mm Number of attempts: 1 Airway Equipment and Method: Stylet Placement Confirmation: ETT inserted through vocal cords under direct vision,  CO2 detector and Breath sounds checked- equal and bilateral Secured at: 24 cm Tube secured with: ETT holder Dental Injury: Teeth and Oropharynx as per pre-operative assessment  Future Recommendations: Recommend- induction with short-acting agent, and alternative techniques readily available      (including critical care time)  Medications Ordered in ED Medications  nitroGLYCERIN 50 mg in dextrose 5 % 250 mL (0.2 mg/mL) infusion (50 mcg/min Intravenous New Bag/Given 04/21/18 0648)  vancomycin (VANCOCIN) 1,250 mg in sodium chloride 0.9 % 250 mL IVPB (has no administration in time range)  ceFEPIme (MAXIPIME) 1 g in sodium chloride 0.9 % 100 mL IVPB (has no administration in time range)  propofol (DIPRIVAN) 1000 MG/100ML infusion (15 mcg/kg/min  Rate/Dose Change 04/21/18 0636)  etomidate (AMIDATE) injection (20 mg Intravenous Given 04/21/18 0612)  rocuronium (ZEMURON) injection (75 mg Intravenous Given 04/21/18 0613)  furosemide (LASIX) injection 80 mg (80 mg Intravenous Given 04/21/18 0648)     Initial Impression / Assessment and Plan / ED Course  I have reviewed the triage vital signs and the nursing notes.  Pertinent labs &  imaging results that were  available during my care of the patient were reviewed by me and considered in my medical decision making (see chart for details).  [name] was evaluated in Emergency Department on 04/21/2018 for the symptoms described in the history of present illness. She was evaluated in the context of the global COVID-19 pandemic, which necessitated consideration that the patient might be at risk for infection with the SARS-CoV-2 virus that causes COVID-19. Institutional protocols and algorithms that pertain to the evaluation of patients at risk for COVID-19 are in a state of rapid change based on information released by regulatory bodies including the CDC and federal and state organizations. These policies and algorithms were followed during the patient's care in the ED.  Patient with a picture consistent with likely flash pulmonary edema.  X-ray is very asymmetric interstitial edema raising the question of possible infection so antibiotics added on.  Patient intubated for airway protection and hypoxic respiratory failure.  Discussed with ICU attending and will except for admission and decide about possible coronavirus testing after more results.  Final Clinical Impressions(s) / ED Diagnoses   Final diagnoses:  Hypoxia  Acute respiratory failure with hypoxia [facility])    ED Discharge Orders    None       Ronalee Scheunemann, Corene Cornea, MD 04/21/18 [id number]

## 2018-04-21 NOTE — Progress Notes (Signed)
Pharmacy Antibiotic Note  [name] is a 78 y.o. female admitted on 04/21/2018 with sepsis.  Pharmacy has been consulted for vancomycin dosing.  Transfer from SNF after being found unresponsive and hypoxmic. CXR showing pulmonary edema. Received 1 g IV cefepime and 1250 mg IV vancomycin on 4/5 in ED. WBC 19.8, LA 5, initial temp was 33 degrees. Scr 1.75 (CrCl 21 mL/min) - similar to previous values.  Plan: Adjusted cefepime to 1 g IV every 24 hours  Order vancomycin 1 g IV every 48 hours (estAUC 558) Monitor renal fx, clinical pic, cx results, and vanc levels as appropriate  Weight: 132 lb 4.4 oz (60 kg)  Temp (24hrs), Avg:93.9 F (34.4 C), Min:91.6 F (33.1 C), Max:96.2 F (35.7 C)  Recent Labs  Lab 04/21/18 0627  WBC 19.8*  CREATININE 1.75*  LATICACIDVEN 5.0*    Estimated Creatinine Clearance: 21.7 mL/min (A) (by C-G formula based on SCr of 1.75 mg/dL (H)).    No Known Allergies  Antimicrobials this admission: Cefepime 4/5 >>  Vancomycin 4/5 >>   Dose adjustments this admission: N/A  Microbiology results: 4/5 BCx: sent 4/5 UCx: sent  4/5 Resp panel: sent  4/5 COVID: sent  Thank you for allowing pharmacy to be a part of this patient's care.  Antonietta Jewel, PharmD, [place] Clinical Pharmacist  Pager: [phone] Phone: [phone] 04/21/2018 3:14 PM

## 2018-04-21 NOTE — ED Notes (Signed)
Paged admitting per RN  

## 2018-04-21 NOTE — ED Notes (Signed)
Foley insertion site area appears okay.

## 2018-04-21 NOTE — Progress Notes (Signed)
Hypoglycemic Event  CBG: 50  Treatment: D50 25 mL (12.5 gm)  Symptoms: None  Follow-up CBG: Time:2042 CBG Result:104  Possible Reasons for Event: Inadequate meal intake- NPO  Comments/MD notified:patient also on D5NS gtt     [name], [name]

## 2018-04-22 ENCOUNTER — Telehealth: Payer: Self-pay

## 2018-04-22 ENCOUNTER — Inpatient Hospital Stay (HOSPITAL_COMMUNITY): Payer: Medicare PPO

## 2018-04-22 ENCOUNTER — Ambulatory Visit: Payer: Self-pay

## 2018-04-22 DIAGNOSIS — I2111 ST elevation (STEMI) myocardial infarction involving right coronary artery: Secondary | ICD-10-CM

## 2018-04-22 DIAGNOSIS — N183 Chronic kidney disease, stage 3 unspecified: Secondary | ICD-10-CM

## 2018-04-22 DIAGNOSIS — Z9911 Dependence on respirator [ventilator] status: Secondary | ICD-10-CM

## 2018-04-22 DIAGNOSIS — M069 Rheumatoid arthritis, unspecified: Secondary | ICD-10-CM

## 2018-04-22 LAB — POCT I-STAT 7, (LYTES, BLD GAS, ICA,H+H)
Acid-base deficit: 6 mmol/L — ABNORMAL HIGH (ref 0.0–2.0)
Bicarbonate: 17.8 mmol/L — ABNORMAL LOW (ref 20.0–28.0)
Calcium, Ion: 1.06 mmol/L — ABNORMAL LOW (ref 1.15–1.40)
HCT: 24 % — ABNORMAL LOW (ref 36.0–46.0)
Hemoglobin: 8.2 g/dL — ABNORMAL LOW (ref 12.0–15.0)
O2 Saturation: 99 %
Patient temperature: 98.3
Potassium: 3.5 mmol/L (ref 3.5–5.1)
Sodium: 146 mmol/L — ABNORMAL HIGH (ref 135–145)
TCO2: 19 mmol/L — ABNORMAL LOW (ref 22–32)
pCO2 arterial: 27.8 mmHg — ABNORMAL LOW (ref 32.0–48.0)
pH, Arterial: 7.412 (ref 7.350–7.450)
pO2, Arterial: 134 mmHg — ABNORMAL HIGH (ref 83.0–108.0)

## 2018-04-22 LAB — GLUCOSE, CAPILLARY
Glucose-Capillary: 100 mg/dL — ABNORMAL HIGH (ref 70–99)
Glucose-Capillary: 110 mg/dL — ABNORMAL HIGH (ref 70–99)
Glucose-Capillary: 79 mg/dL (ref 70–99)
Glucose-Capillary: 96 mg/dL (ref 70–99)
Glucose-Capillary: 132 mg/dL — ABNORMAL HIGH (ref 70–99)

## 2018-04-22 LAB — HEPATIC FUNCTION PANEL
ALT: 22 U/L (ref 0–44)
AST: 23 U/L (ref 15–41)
Albumin: 2.6 g/dL — ABNORMAL LOW (ref 3.5–5.0)
Alkaline Phosphatase: 83 U/L (ref 38–126)
Bilirubin, Direct: 0.1 mg/dL (ref 0.0–0.2)
Indirect Bilirubin: 0.4 mg/dL (ref 0.3–0.9)
Total Bilirubin: 0.5 mg/dL (ref 0.3–1.2)
Total Protein: 6.4 g/dL — ABNORMAL LOW (ref 6.5–8.1)

## 2018-04-22 LAB — PHOSPHORUS
Phosphorus: 5 mg/dL — ABNORMAL HIGH (ref 2.5–4.6)
Phosphorus: 5.1 mg/dL — ABNORMAL HIGH (ref 2.5–4.6)
Phosphorus: 5.3 mg/dL — ABNORMAL HIGH (ref 2.5–4.6)

## 2018-04-22 LAB — LEGIONELLA PNEUMOPHILA SEROGP 1 UR AG: L. pneumophila Serogp 1 Ur Ag: NEGATIVE

## 2018-04-22 LAB — BASIC METABOLIC PANEL
Anion gap: 14 (ref 5–15)
BUN: 38 mg/dL — ABNORMAL HIGH (ref 8–23)
CO2: 15 mmol/L — ABNORMAL LOW (ref 22–32)
Calcium: 7.4 mg/dL — ABNORMAL LOW (ref 8.9–10.3)
Chloride: 115 mmol/L — ABNORMAL HIGH (ref 98–111)
Creatinine, Ser: 2.07 mg/dL — ABNORMAL HIGH (ref 0.44–1.00)
GFR calc Af Amer: 26 mL/min — ABNORMAL LOW (ref >60)
GFR calc non Af Amer: 22 mL/min — ABNORMAL LOW (ref >60)
Glucose, Bld: 127 mg/dL — ABNORMAL HIGH (ref 70–99)
Potassium: 3.5 mmol/L (ref 3.5–5.1)
Sodium: 144 mmol/L (ref 135–145)

## 2018-04-22 LAB — PROTIME-INR
INR: 2.5 — ABNORMAL HIGH (ref 0.8–1.2)
Prothrombin Time: 26.5 seconds — ABNORMAL HIGH (ref 11.4–15.2)

## 2018-04-22 LAB — CBC WITH DIFFERENTIAL/PLATELET
Abs Immature Granulocytes: 0.13 10*3/uL — ABNORMAL HIGH (ref 0.00–0.07)
Basophils Absolute: 0.1 10*3/uL (ref 0.0–0.1)
Basophils Relative: 1 %
Eosinophils Absolute: 0.5 10*3/uL (ref 0.0–0.5)
Eosinophils Relative: 3 %
HCT: 26.3 % — ABNORMAL LOW (ref 36.0–46.0)
Hemoglobin: 8.5 g/dL — ABNORMAL LOW (ref 12.0–15.0)
Immature Granulocytes: 1 %
Lymphocytes Relative: 12 %
Lymphs Abs: 1.9 10*3/uL (ref 0.7–4.0)
MCH: 27 pg (ref 26.0–34.0)
MCHC: 32.3 g/dL (ref 30.0–36.0)
MCV: 83.5 fL (ref 80.0–100.0)
Monocytes Absolute: 1.6 10*3/uL — ABNORMAL HIGH (ref 0.1–1.0)
Monocytes Relative: 10 %
Neutro Abs: 11.4 10*3/uL — ABNORMAL HIGH (ref 1.7–7.7)
Neutrophils Relative %: 73 %
Platelets: 332 10*3/uL (ref 150–400)
RBC: 3.15 MIL/uL — ABNORMAL LOW (ref 3.87–5.11)
RDW: 20.3 % — ABNORMAL HIGH (ref 11.5–15.5)
WBC: 15.6 10*3/uL — ABNORMAL HIGH (ref 4.0–10.5)
nRBC: 0 % (ref 0.0–0.2)

## 2018-04-22 LAB — TROPONIN I: Troponin I: <0.03 ng/mL (ref <0.03)

## 2018-04-22 LAB — MAGNESIUM
Magnesium: 1.1 mg/dL — ABNORMAL LOW (ref 1.7–2.4)
Magnesium: 1.9 mg/dL (ref 1.7–2.4)
Magnesium: 2.5 mg/dL — ABNORMAL HIGH (ref 1.7–2.4)

## 2018-04-22 LAB — URINE CULTURE: Culture: NO GROWTH

## 2018-04-22 LAB — BRAIN NATRIURETIC PEPTIDE: B Natriuretic Peptide: 66.7 pg/mL (ref 0.0–100.0)

## 2018-04-22 MED ORDER — VITAL HIGH PROTEIN PO LIQD
1000.0000 mL | ORAL | Status: DC
  Administered 2018-04-22: 13:00:00 1000 mL

## 2018-04-22 MED ORDER — ADULT MULTIVITAMIN W/MINERALS CH
1.0000 | ORAL_TABLET | Freq: Every day | ORAL | Status: DC
  Administered 2018-04-22: 1
  Filled 2018-04-22 (×2): qty 1

## 2018-04-22 MED ORDER — MAGNESIUM SULFATE 4 GM/100ML IV SOLN
4.0000 g | Freq: Once | INTRAVENOUS | Status: AC
  Administered 2018-04-22: 10:00:00 4 g via INTRAVENOUS
  Filled 2018-04-22: qty 100

## 2018-04-22 NOTE — Progress Notes (Signed)
NAME:  [name], MRN:  [id number], DOB:  21-Nov-1940, LOS: 1 ADMISSION DATE:  04/21/2018, CONSULTATION DATE:  04/21/2018  REFERRING MD:  ER doc, CHIEF COMPLAINT:  resp failure intubated  -snf resident, rule out covid -12 but low suspicison   Brief History   78 yo AHRF intubated from SNF, COVID R/O   History of present illness    78 year old lady.  History is gained from review of the chart and talking to referring emergency department physician.  She was admitted in February 2020 with inferior wall myocardial infarction and coronary artery disease status post right coronary artery stenting.  She does not have rheumatoid arthritis on treatment, seizures, hypertension, hyperlipidemia, chronic kidney disease [creatinine at discharge was 1.6 mg percent and through the hospital was between 1.4-1.7 mg percent].  She was admitted for 6 days in the middle of March 2020 [March 29, 2018-  April 03, 2018] for dehydration and failure to thrive.  During this admission she was flu PCR negative.  At that point in time she was living at home.  DNR status was recommended but family rejected.  Patient was discharged to a nursing home.  She represented to the emergency department from the skilled nursing facility after being found unresponsive and hypoxemic.  In the emergency department chest x-ray suggested pulmonary edema and she was hypertensive.  She was intubated.  She has been on propofol drip and nitroglycerin drip.  Her blood labs resulted showing a high BNP of 500s, low procalcitonin normal less than 0.1, normal lymphocytes.  Given the ongoing pandemic and reports of coronavirus COVID-19 outbreak and skilled nursing facilities she has been determined in the ER to be a rule out covid-19  Echocardiogram February 2020 with ejection fraction greater than 65% but asymmetric wall motion abnormalities present.  Past Medical History    has a past medical history of Acute kidney injury superimposed on chronic  kidney disease ([place]) (02/06/2015), Anxiety, CAD (coronary artery disease) (02/28/2018), Depression, DVT (deep venous thrombosis) ([place]) (1970s), Heart murmur, Hypercholesterolemia, Hypertension, Kidney stones, Migraine, Multiple falls, Pneumonia ("several times"), Rheumatoid arthritis(714.0), Seizure ([place]), Syncope and collapse, and Vision abnormalities.   reports that she quit smoking about 3 months ago. Her smoking use included cigarettes. She has a 62.00 pack-year smoking history. She has never used smokeless tobacco.  Past Surgical History:  Procedure Laterality Date  . ABDOMINAL HYSTERECTOMY    . CARPAL TUNNEL RELEASE Bilateral   . CATARACT EXTRACTION W/ INTRAOCULAR LENS  IMPLANT, BILATERAL Bilateral   . CORONARY/GRAFT ACUTE MI REVASCULARIZATION N/A 02/28/2018   Procedure: Coronary/Graft Acute MI Revascularization;  Surgeon: Jettie Booze, MD;  Location: [facility];  Service: Cardiovascular;  Laterality: N/A;  . ESOPHAGOGASTRODUODENOSCOPY N/A 03/05/2018   Procedure: ESOPHAGOGASTRODUODENOSCOPY (EGD);  Surgeon: Ladene Artist, MD;  Location: [facility];  Service: Endoscopy;  Laterality: N/A;  . LEFT HEART CATH AND CORONARY ANGIOGRAPHY N/A 02/28/2018   Procedure: LEFT HEART CATH AND CORONARY ANGIOGRAPHY;  Surgeon: Jettie Booze, MD;  Location: [facility];  Service: Cardiovascular;  Laterality: N/A;  . NECK EXPLORATION  07/16/2015  . PARATHYROIDECTOMY Right 07/16/2015   superior  . PARATHYROIDECTOMY Right 07/16/2015   Procedure: RIGHT INFERIOR PARATHYROIDECTOMY;  Surgeon: Armandina Gemma, MD;  Location: [place] [facility];  Service: General;  Laterality: Right;  . SUBMUCOSAL INJECTION  03/05/2018   Procedure: SUBMUCOSAL INJECTION;  Surgeon: Ladene Artist, MD;  Location: [facility] ENDOSCOPY;  Service: Endoscopy;;  . TOENAIL AVULSION Bilateral    great toe  No Known Allergies   There is no immunization history on file for this patient.  Family History  Problem Relation Age of  Onset  . Cancer Father   . Prostate cancer Father   . Cancer Brother   . Heart disease Brother   . Miscarriages / [place] Brother   . Heart disease Mother   . Pneumonia Mother      Current Facility-Administered Medications:  .  ceFEPIme (MAXIPIME) 1 g in sodium chloride 0.9 % 100 mL IVPB, 1 g, Intravenous, Q24H, Shellia Cleverly, MD .  chlorhexidine gluconate (MEDLINE KIT) (PERIDEX) 0.12 % solution 15 mL, 15 mL, Mouth Rinse, BID, Ramaswamy, Murali, MD, 15 mL at 04/22/18 0800 .  dextrose 5 %-0.9 % sodium chloride infusion, , Intravenous, Continuous, Shellia Cleverly, MD, Last Rate: 75 mL/hr at 04/22/18 0600 .  docusate (COLACE) 50 MG/5ML liquid 100 mg, 100 mg, Per Tube, BID PRN, Shellia Cleverly, MD, 100 mg at 04/21/18 2340 .  enoxaparin (LOVENOX) injection 30 mg, 30 mg, Subcutaneous, Q24H, Ramaswamy, Murali, MD, 30 mg at 04/21/18 2022 .  famotidine (PEPCID) IVPB 20 mg in NS 100 mL IVPB, 20 mg, Intravenous, Q24H, Brand Males, MD, Stopped at 04/22/18 0049 .  fentaNYL (SUBLIMAZE) bolus via infusion 25 mcg, 25 mcg, Intravenous, Q1H PRN, Shellia Cleverly, MD .  fentaNYL (SUBLIMAZE) injection 50 mcg, 50 mcg, Intravenous, Once, Shellia Cleverly, MD .  fentaNYL 2534mg in NS 2583m(1052mml) infusion-PREMIX, 25-400 mcg/hr, Intravenous, Continuous, ErwShellia CleverlyD, Last Rate: 10 mL/hr at 04/22/18 0600, 100 mcg/hr at 04/22/18 0600 .  furosemide (LASIX) injection 80 mg, 80 mg, Intravenous, Q8H, Ramaswamy, Murali, MD, 80 mg at 04/22/18 0504 .  insulin aspart (novoLOG) injection 0-9 Units, 0-9 Units, Subcutaneous, Q4H, Ramaswamy, Murali, MD .  MEDLINE mouth rinse, 15 mL, Mouth Rinse, 10 times per day, RamBrand MalesD, 15 mL at 04/22/18 0506 .  midazolam (VERSED) injection 1-4 mg, 1-4 mg, Intravenous, Q2H PRN, RamChase Callerurali, MD .  morphine 2 MG/ML injection 1-4 mg, 1-4 mg, Intravenous, Q2H PRN, RamChase Callerurali, MD .  nitroGLYCERIN 50 mg in dextrose 5 % 250 mL (0.2 mg/mL)  infusion, 0-200 mcg/min, Intravenous, Continuous, Mesner, JasCorene CorneaD, Stopped at 04/21/18 0945 .  norepinephrine (LEVOPHED) 4mg45m 250mL62mmix infusion, 0-10 mcg/min, Intravenous, Titrated, ErwinShellia Cleverly Last Rate: 18.75 mL/hr at 04/22/18 0600, 5 mcg/min at 04/22/18 0600 .  propofol (DIPRIVAN) 1000 MG/100ML infusion, 5-80 mcg/kg/min, Intravenous, Titrated, Icard, Bradley L, DO, Last Rate: 10.69 mL/hr at 04/22/18 0600, 35 mcg/kg/min at 04/22/18 0600 .  [START ON 04/23/2018] vancomycin (VANCOCIN) IVPB 1000 mg/200 mL premix, 1,000 mg, Intravenous, Q48H, ErwinShellia Cleverly  Significant Hospital Events   04/21/2018  - admit, intubated in ER  Consults:  04/21/2018 - ccm consult  Procedures:  ETT in ER 04/21/2018  Significant Diagnostic Tests:  x  Micro Data:  covid-19 pcr -   Antimicrobials:  04/21/2018  - vanc 04/21/2018 - cefepime   Interim history/subjective:  04/21/2018 - intubated in ER  Objective   Blood pressure 106/60, pulse 63, temperature 98.2 F (36.8 C), temperature source Axillary, resp. rate 20, weight 50.7 kg, SpO2 100 %.    Vent Mode: PRVC FiO2 (%):  [40 %-60 %] 40 % Set Rate:  [20 bmp] 20 bmp Vt Set:  [380 mL] 380 mL PEEP:  [5 cmH20] 5 cmH20 Plateau Pressure:  [16 cmH20-19 cmH20] 17 cmH20   Intake/Output Summary (Last 24 hours) at 04/22/2018 0808 4128  data filed at 04/22/2018 0600 Gross per 24 hour  Intake 1466.21 ml  Output 2900 ml  Net -1433.79 ml   Filed Weights   04/21/18 0622 04/21/18 1700 04/22/18 0500  Weight: 60 kg 50.9 kg 50.7 kg    Examination: General: Elderly fm, intubated on mechanical ventilation HENT: ETT in place, NCAT  Lungs: bilateral ventilated breath sounds Cardiovascular: RRR, s1 s2  Abdomen: soft, nt, nd, BS present Extremities: no cyanosis, little edmea  Neuro: sedated on mech vent   LABS    PULMONARY Recent Labs  Lab 04/21/18 0636 04/21/18 0754 04/22/18 0205  PHART 7.206* 7.274* 7.412  PCO2ART 37.2 39.0 27.8*  PO2ART  54.0* 174.0* 134.0*  HCO3 15.5* 19.0* 17.8*  TCO2 17* 20* 19*  O2SAT 88.0 99.0 99.0    CBC Recent Labs  Lab 04/21/18 0627  04/21/18 0754 04/22/18 0205 04/22/18 0324  HGB 9.8*   < > 10.2* 8.2* 8.5*  HCT 32.6*   < > 30.0* 24.0* 26.3*  WBC 19.8*  --   --   --  15.6*  PLT 414*  --   --   --  332   < > = values in this interval not displayed.    COAGULATION Recent Labs  Lab 04/22/18 0028  INR 2.5*    CARDIAC   Recent Labs  Lab 04/21/18 0627 04/22/18 0324  TROPONINI <0.03 <0.03   No results for input(s): PROBNP in the last 168 hours.   CHEMISTRY Recent Labs  Lab 04/21/18 0627 04/21/18 0636 04/21/18 0754 04/22/18 0205 04/22/18 0324  NA 142 144 145 146* 144  K 5.1 4.9 4.2 3.5 3.5  CL 114*  --   --   --  115*  CO2 16*  --   --   --  15*  GLUCOSE 207*  --   --   --  127*  BUN 33*  --   --   --  38*  CREATININE 1.75*  --   --   --  2.07*  CALCIUM 7.9*  --   --   --  7.4*  MG  --   --   --   --  1.1*  PHOS  --   --   --   --  5.0*   Estimated Creatinine Clearance: 16.6 mL/min (A) (by C-G formula based on SCr of 2.07 mg/dL (H)).   LIVER Recent Labs  Lab 04/21/18 0627 04/22/18 0028 04/22/18 0324  AST 49*  --  23  ALT 23  --  22  ALKPHOS 110  --  83  BILITOT 0.6  --  0.5  PROT 6.7  --  6.4*  ALBUMIN 2.6*  --  2.6*  INR  --  2.5*  --     INFECTIOUS Recent Labs  Lab 04/21/18 0627  LATICACIDVEN 5.0*  PROCALCITON <0.10    ENDOCRINE CBG (last 3)  Recent Labs    04/21/18 2336 04/22/18 0457 04/22/18 0758  GLUCAP 123* 100* 110*    IMAGING x48h  - image(s) personally visualized  -   highlighted in bold Dg Abd 1 View  Result Date: 04/21/2018 CLINICAL DATA:  Confirm OG tube placement EXAM: ABDOMEN - 1 VIEW COMPARISON:  None. FINDINGS: Orogastric tube with the tip projecting over the stomach. There is no bowel dilatation to suggest obstruction. There is no evidence of pneumoperitoneum, portal venous gas or pneumatosis. There are no pathologic  calcifications along the expected course of the ureters. The osseous structures are unremarkable. IMPRESSION: Orogastric  tube with the tip projecting over the stomach. Electronically Signed   By: Kathreen Devoid   On: 04/21/2018 18:00   Dg Chest Port 1 View  Result Date: 04/22/2018 CLINICAL DATA:  Endotracheal tube placement EXAM: PORTABLE CHEST 1 VIEW COMPARISON:  04/21/2018 FINDINGS: Endotracheal tube tip between the clavicular heads and carina. The orogastric tube at least reaches the stomach. Diffuse interstitial opacity that is improved. Probable trace layering pleural effusion. Borderline heart size, stable. IMPRESSION: 1. Stable hardware positioning. 2.  Improved edema. Electronically Signed   By: Monte Fantasia M.D.   On: 04/22/2018 06:50   Dg Chest Portable 1 View  Result Date: 04/21/2018 CLINICAL DATA:  Shortness of breath.  Respiratory distress. EXAM: PORTABLE CHEST 1 VIEW COMPARISON:  04/02/2018 FINDINGS: Endotracheal tube tip is just above the carina. Retraction by 5.5 cm would place it at the level of the clavicular heads. Orogastric tube tip and side port are beyond the field of view. Interstitial opacities in the right lung have worsened since the prior study. Left lung is clear. IMPRESSION: 1. Endotracheal tube tip just above the carina. Recommend retraction by 3-5 cm. 2. Markedly worsened interstitial opacities within the right lung which may indicate infection or asymmetric pulmonary edema. Electronically Signed   By: Ulyses Jarred M.D.   On: 04/21/2018 06:45    Resolved Hospital Problem list   x  Assessment & Plan:   Acute hypoxemic respiratory failure with infiltrates  - BNP 500s, probable pulmonary edema  - RVP negative, Flu negative COVID pending  - Given pandemic setting coronavirus is a possibility but low probably - remains on full vent support - will wean fio2 as tolerated  - LTVV strategy    History of MI and right coronary stenting in February 2020 - possible repeat  echo in future - doesn't change management right now  Acute on chronic diastolic heart failure  - responded to diuresis  - UOP >-1L    Hypertension - can restart home meds slowly  - will review with pharmacy   Chronic kidney disease with baseline creatinine between 1.4 mg percent and 1.7 mg percent At admission creatinine 1.75 mg percent - avoiding nephro toxic drugs   At risk hyperglycemia Phase 1 ICU hyperglycemia protocol - ssi as needed    Best practice:  Diet: N.p.o., start tube feeds April 22, 2018 Pain/Anxiety/Delirium protocol (if indicated): Propofol wit morphine as needed  VAP protocol (if indicated): Head of bed greater than 30 degrees DVT prophylaxis: Heparin GI prophylaxis: Protonix Glucose control: SSI Mobility: Bedrest Code Status: d./w Husband and agreed for Limited code Family Communication: Husband Disposition:   This patient is critically ill with multiple organ system failure; which, requires frequent high complexity decision making, assessment, support, evaluation, and titration of therapies. This was completed through the application of advanced monitoring technologies and extensive interpretation of multiple databases. During this encounter critical care time was devoted to patient care services described in this note for 36 minutes.   Garner Nash, DO [facility] 04/22/2018 8:08 AM  Personal pager: [phone] If unanswered, please page CCM On-call: [phone]

## 2018-04-22 NOTE — Progress Notes (Signed)
Patient's husband updated on patient's status over the phone.

## 2018-04-22 NOTE — Progress Notes (Signed)
Inpatient Diabetes Program Recommendations  AACE/ADA: New Consensus Statement on Inpatient Glycemic Control (2015)  Target Ranges:  Prepandial:   less than 140 mg/dL      Peak postprandial:   less than 180 mg/dL (1-2 hours)      Critically ill patients:  140 - 180 mg/dL   Lab Results  Component Value Date   GLUCAP 110 (H) 04/22/2018   HGBA1C 5.2 02/28/2018    Review of Glycemic Control Results for OLETTA, BUEHRING (MRN [id number]) as of 04/22/2018 10:26  Ref. Range 04/21/2018 20:14 04/21/2018 20:42 04/21/2018 23:36 04/22/2018 04:57  Glucose-Capillary Latest Ref Range: 70 - 99 mg/dL 50 (L) 104 (H) 123 (H) 100 (H)   Diabetes history: None noted Current orders for Inpatient glycemic control:  Novolog sensitive q 4 hours Inpatient Diabetes Program Recommendations:   May consider holding Novolog correction.  Thanks,  Adah Perl, RN, BC-ADM Inpatient Diabetes Coordinator Pager [phone] (8a-5p)

## 2018-04-22 NOTE — Chronic Care Management (AMB) (Signed)
  Chronic Care Management   Note  04/22/2018 Name: VIRGINA DEAKINS MRN: [id number] DOB: [birth year]   Upon review of patient's chart in preparation to follow up on CCM referral, noted patient was admitted on 04/21/18, dx: resp failure intubated  -snf resident, rule out covid -52 but low suspicion.   Follow up plan: The CCM team will continue to monitor for change in patient status and or discharge.   [name], RN,CCM Care Management Coordinator [facility]  Direct Phone: [phone]

## 2018-04-22 NOTE — Consult Note (Signed)
   [facility] Inpatient Consult   04/22/2018  [name] May 22, 1940 [id number]   Patient was recently outreached by a  [facility] social worker for issues for transitioning back home from [facility] facility.  Patient with [facility] PPO.  Our community based plan of care has focused on disease management and community resource support.  Patient was admitted with being found unresponsive and hypoxic - pulmonary edema. Patient is currently ventilated. Will follow for progress and disposition as appropriate.   Of note, [facility] Care Management services does not replace or interfere with any services that are needed or arranged by inpatient case management or social work.  For additional questions or referrals please contact:  Natividad Brood, RN BSN [facility] Liaison  [phone] business mobile phone Toll free office [phone]

## 2018-04-22 NOTE — Progress Notes (Signed)
eLink Physician-Brief Progress Note Patient Name: [name] DOB: 03-15-1940 MRN: [id number]   Date of Service  04/22/2018  HPI/Events of Note  Agitation - Request to renew soft bilateral wrist restraints.   eICU Interventions  Will renew soft bilateral wrist restraints.      Intervention Category Major Interventions: Delirium, psychosis, severe agitation - evaluation and management  Sommer,Steven Eugene 04/22/2018, 9:11 PM

## 2018-04-22 NOTE — Progress Notes (Signed)
Initial Nutrition Assessment   RD working remotely.   DOCUMENTATION CODES:   Not applicable  INTERVENTION:   Tube Feeding:  Vital High Protein at 35 ml/hr Provides 74 g of protein, 840 kcals and 706 mL of free water Meets 100% protein needs  TF regimen and propofol at current rate providing 1156 kcals total kcal/day (105 % of kcal needs)  Add MVI with Minerals   NUTRITION DIAGNOSIS:   Inadequate oral intake related to acute illness as evidenced by NPO status.  GOAL:   Patient will meet greater than or equal to 90% of their needs  MONITOR:   Vent status, TF tolerance, Labs, Weight trends, Skin  REASON FOR ASSESSMENT:   Ventilator, Consult Enteral/tube feeding initiation and management  ASSESSMENT:   78 yo female admitted from SNF with acute respiratory failure with infiltrates requiring intubation COVID-19 results pending, AKI on CKD. Pt admitted Feb 2020 for MI and CAD s/p stenting. Admitted March 2020 for dehydration and FTT. PMH includes CKD, MI, HTN; surigcal hx includes parathyroidectomy  Patient is currently intubated on ventilator support, currently on levophed, fentanyl and propofol for sedation MV: 8.2 L/min Temp (24hrs), Avg:97.9 F (36.6 C), Min:96.2 F (35.7 C), Max:98.7 F (37.1 C)  Propofol: 13.5 ml/hr  OG tube with tip in stomach, abdomen soft, BS present, +flatus, no BM  Unable to obtain diet and weight history at this time Current wt 50.7 kg; admission wt 50.9 kg.   Labs: Creatinine 2.07, BUN 38, phosphorus 5.0 (H), magnesium 1.1 (L), potassium 3.5 (wdl), sodium 144 (wdl) Meds: D5-Ns at 75 ml/hr, lasix, ss novolog  NUTRITION - FOCUSED PHYSICAL EXAM:  Unable to assess, working remotely  Diet Order:   Diet Order            Diet NPO time specified  Diet effective now              EDUCATION NEEDS:   Not appropriate for education at this time  Skin:  Skin Assessment: Reviewed RN Assessment  Last BM:  no documented BM  Height:    Ht Readings from Last 1 Encounters:  03/29/18 5\' 1"  (1.549 m)    Weight:   Wt Readings from Last 1 Encounters:  04/22/18 50.7 kg    BMI:  Body mass index is 21.12 kg/m.  Estimated Nutritional Needs:   Kcal:  1130 kcals   Protein:  65-100 g   Fluid:  >/= 1.5 L   Kerman Passey MS, RD, LDN, CNSC [phone] Pager  [phone] Weekend/On-Call Pager

## 2018-04-23 DIAGNOSIS — G9340 Encephalopathy, unspecified: Secondary | ICD-10-CM

## 2018-04-23 DIAGNOSIS — I10 Essential (primary) hypertension: Secondary | ICD-10-CM

## 2018-04-23 DIAGNOSIS — N179 Acute kidney failure, unspecified: Secondary | ICD-10-CM

## 2018-04-23 DIAGNOSIS — N182 Chronic kidney disease, stage 2 (mild): Secondary | ICD-10-CM

## 2018-04-23 DIAGNOSIS — J810 Acute pulmonary edema: Secondary | ICD-10-CM

## 2018-04-23 LAB — GLUCOSE, CAPILLARY
Glucose-Capillary: 109 mg/dL — ABNORMAL HIGH (ref 70–99)
Glucose-Capillary: 109 mg/dL — ABNORMAL HIGH (ref 70–99)
Glucose-Capillary: 113 mg/dL — ABNORMAL HIGH (ref 70–99)
Glucose-Capillary: 129 mg/dL — ABNORMAL HIGH (ref 70–99)
Glucose-Capillary: 164 mg/dL — ABNORMAL HIGH (ref 70–99)
Glucose-Capillary: 97 mg/dL (ref 70–99)
Glucose-Capillary: 99 mg/dL (ref 70–99)

## 2018-04-23 LAB — CBC WITH DIFFERENTIAL/PLATELET
Abs Immature Granulocytes: 0.17 10*3/uL — ABNORMAL HIGH (ref 0.00–0.07)
Basophils Absolute: 0.1 10*3/uL (ref 0.0–0.1)
Basophils Relative: 0 %
Eosinophils Absolute: 0.3 10*3/uL (ref 0.0–0.5)
Eosinophils Relative: 2 %
HCT: 26.7 % — ABNORMAL LOW (ref 36.0–46.0)
Hemoglobin: 8.2 g/dL — ABNORMAL LOW (ref 12.0–15.0)
Immature Granulocytes: 1 %
Lymphocytes Relative: 7 %
Lymphs Abs: 1.2 10*3/uL (ref 0.7–4.0)
MCH: 25.5 pg — ABNORMAL LOW (ref 26.0–34.0)
MCHC: 30.7 g/dL (ref 30.0–36.0)
MCV: 82.9 fL (ref 80.0–100.0)
Monocytes Absolute: 1.6 10*3/uL — ABNORMAL HIGH (ref 0.1–1.0)
Monocytes Relative: 9 %
Neutro Abs: 14.4 10*3/uL — ABNORMAL HIGH (ref 1.7–7.7)
Neutrophils Relative %: 81 %
Platelets: 275 10*3/uL (ref 150–400)
RBC: 3.22 MIL/uL — ABNORMAL LOW (ref 3.87–5.11)
RDW: 20.1 % — ABNORMAL HIGH (ref 11.5–15.5)
WBC: 17.8 10*3/uL — ABNORMAL HIGH (ref 4.0–10.5)
nRBC: 0 % (ref 0.0–0.2)

## 2018-04-23 LAB — MAGNESIUM: Magnesium: 2.1 mg/dL (ref 1.7–2.4)

## 2018-04-23 LAB — PHOSPHORUS: Phosphorus: 4.8 mg/dL — ABNORMAL HIGH (ref 2.5–4.6)

## 2018-04-23 MED ORDER — ORAL CARE MOUTH RINSE
15.0000 mL | Freq: Three times a day (TID) | OROMUCOSAL | Status: DC
  Administered 2018-04-24 – 2018-04-26 (×4): 15 mL via OROMUCOSAL

## 2018-04-23 MED ORDER — LABETALOL HCL 5 MG/ML IV SOLN
10.0000 mg | INTRAVENOUS | Status: DC | PRN
  Administered 2018-04-23 – 2018-04-26 (×6): 10 mg via INTRAVENOUS
  Filled 2018-04-23 (×4): qty 4

## 2018-04-23 MED ORDER — LORAZEPAM 2 MG/ML IJ SOLN: 0.5000 mg | INTRAMUSCULAR | Status: DC | PRN

## 2018-04-23 MED ORDER — LABETALOL HCL 5 MG/ML IV SOLN
10.0000 mg | INTRAVENOUS | Status: DC | PRN
  Administered 2018-04-23 – 2018-04-24 (×5): 10 mg via INTRAVENOUS
  Filled 2018-04-23 (×4): qty 4

## 2018-04-23 MED ORDER — POTASSIUM CHLORIDE 20 MEQ/15ML (10%) PO SOLN
40.0000 meq | Freq: Once | ORAL | Status: AC
  Administered 2018-04-23: 40 meq
  Filled 2018-04-23: qty 30

## 2018-04-23 MED ORDER — PREDNISONE 10 MG PO TABS
10.0000 mg | ORAL_TABLET | Freq: Once | ORAL | Status: AC
  Administered 2018-04-23: 09:00:00 10 mg
  Filled 2018-04-23: qty 1

## 2018-04-23 NOTE — Progress Notes (Signed)
Blood pressure 202/123 and 201/148 after repositioning cuff. MD notified. Orders received for PRN labetalol. Will continue to monitor closely.

## 2018-04-23 NOTE — Procedures (Signed)
Extubation Procedure Note  Patient Details:   Name: MAEVA DANT DOB: Oct 15, 1940 MRN: [id number]   Airway Documentation:    Vent end date: 04/23/18 Vent end time: 0930   Evaluation  O2 sats: stable throughout Complications: No apparent complications Patient did tolerate procedure well. Bilateral Breath Sounds: Clear, Diminished   Yes   Pt was extubated and placed on 2 L  per order of MD. Pt is stable at this time. RT will continue to monitor.  Ronaldo Miyamoto 04/23/2018, 9:37 AM

## 2018-04-23 NOTE — Care Management (Signed)
CM unable to perform readmission screening - pt remains ventilated.  CM will continue to follow

## 2018-04-23 NOTE — Progress Notes (Signed)
Pt combative and refusing ice chips and mouth care.

## 2018-04-23 NOTE — Progress Notes (Signed)
Passed SBT this AM and appears to be tolerating it presently Intermittently agitated Episodic severe hypertension and sinus tachycardia  Vitals:   04/23/18 1200 04/23/18 1300 04/23/18 1330 04/23/18 1400  BP: (!) 163/86 (!) 202/123 (!) 155/85 (!) 167/73  Pulse: (!) 114 (!) 133 (!) 103 99  Resp: 16 (!) 21 17 16   Temp:      TempSrc:      SpO2: 97% 98% 94% 96%  Weight:        Gen: NAD, poorly oriented, sometimes uncooperative HEENT: NCAT, sclerae white Neck: No JVD noted Lungs: breath sounds full, no wheezes, bibasilar crackles Cardiovascular: RRR, no murmurs Abdomen: Soft, nontender, normal BS Ext: without clubbing, cyanosis, edema Neuro: grossly intact Skin: Limited exam, no lesions noted   BMP Latest Ref Rng & Units 04/22/2018 04/22/2018 04/21/2018  Glucose 70 - 99 mg/dL 127(H) - -  BUN 8 - 23 mg/dL 38(H) - -  Creatinine 0.44 - 1.00 mg/dL 2.07(H) - -  Sodium 135 - 145 mmol/L 144 146(H) 145  Potassium 3.5 - 5.1 mmol/L 3.5 3.5 4.2  Chloride 98 - 111 mmol/L 115(H) - -  CO2 22 - 32 mmol/L 15(L) - -  Calcium 8.9 - 10.3 mg/dL 7.4(L) - -   CBC Latest Ref Rng & Units 04/23/2018 04/22/2018 04/22/2018  WBC 4.0 - 10.5 K/uL 17.8(H) 15.6(H) -  Hemoglobin 12.0 - 15.0 g/dL 8.2(L) 8.5(L) 8.2(L)  Hematocrit 36.0 - 46.0 % 26.7(L) 26.3(L) 24.0(L)  Platelets 150 - 400 K/uL 275 332 -   CXR 04/06: Improved edema pattern  IMPRESSION: Acute hypoxemic respiratory failure Probable pulmonary edema Doubt COVID-19 Recent MI and RCA stent Acute/chronic diastolic heart failure Acute/chronic renal insufficiency Episodic severe hypertension History of rheumatoid arthritis, chronic prednisone 5 mg/day and chronic hydroxychloroquine Acute encephalopathy/agitated delirium.  Suspect component of underlying dementia  PLAN/REC: Extubated under my supervision 4/7 Supplemental oxygen as needed to maintain SPO2 >90% Labetalol as needed Monitor BMET intermittently Monitor I/Os Correct electrolytes as  indicated Resume prednisone Low-dose lorazepam   CCM time: 35 mins The above time includes time spent in consultation with patient and/or family members and reviewing care plan on multidisciplinary rounds  Merton Border, MD PCCM service Mobile [phone] Pager [phone] 04/23/2018 2:49 PM

## 2018-04-24 LAB — CBC WITH DIFFERENTIAL/PLATELET
Abs Immature Granulocytes: 0.13 10*3/uL — ABNORMAL HIGH (ref 0.00–0.07)
Basophils Absolute: 0.1 10*3/uL (ref 0.0–0.1)
Basophils Relative: 1 %
Eosinophils Absolute: 0.2 10*3/uL (ref 0.0–0.5)
Eosinophils Relative: 1 %
HCT: 27.5 % — ABNORMAL LOW (ref 36.0–46.0)
Hemoglobin: 8.3 g/dL — ABNORMAL LOW (ref 12.0–15.0)
Immature Granulocytes: 1 %
Lymphocytes Relative: 8 %
Lymphs Abs: 1.4 10*3/uL (ref 0.7–4.0)
MCH: 25.5 pg — ABNORMAL LOW (ref 26.0–34.0)
MCHC: 30.2 g/dL (ref 30.0–36.0)
MCV: 84.4 fL (ref 80.0–100.0)
Monocytes Absolute: 1.6 10*3/uL — ABNORMAL HIGH (ref 0.1–1.0)
Monocytes Relative: 9 %
Neutro Abs: 13.6 10*3/uL — ABNORMAL HIGH (ref 1.7–7.7)
Neutrophils Relative %: 80 %
Platelets: 287 10*3/uL (ref 150–400)
RBC: 3.26 MIL/uL — ABNORMAL LOW (ref 3.87–5.11)
RDW: 20.4 % — ABNORMAL HIGH (ref 11.5–15.5)
WBC: 17 10*3/uL — ABNORMAL HIGH (ref 4.0–10.5)
nRBC: 0 % (ref 0.0–0.2)

## 2018-04-24 LAB — BASIC METABOLIC PANEL
Anion gap: 12 (ref 5–15)
BUN: 37 mg/dL — ABNORMAL HIGH (ref 8–23)
CO2: 19 mmol/L — ABNORMAL LOW (ref 22–32)
Calcium: 8.4 mg/dL — ABNORMAL LOW (ref 8.9–10.3)
Chloride: 116 mmol/L — ABNORMAL HIGH (ref 98–111)
Creatinine, Ser: 1.91 mg/dL — ABNORMAL HIGH (ref 0.44–1.00)
GFR calc Af Amer: 28 mL/min — ABNORMAL LOW (ref >60)
GFR calc non Af Amer: 24 mL/min — ABNORMAL LOW (ref >60)
Glucose, Bld: 91 mg/dL (ref 70–99)
Potassium: 4.4 mmol/L (ref 3.5–5.1)
Sodium: 147 mmol/L — ABNORMAL HIGH (ref 135–145)

## 2018-04-24 LAB — GLUCOSE, CAPILLARY
Glucose-Capillary: 88 mg/dL (ref 70–99)
Glucose-Capillary: 83 mg/dL (ref 70–99)

## 2018-04-24 LAB — NOVEL CORONAVIRUS, NAA (HOSP ORDER, SEND-OUT TO REF LAB; TAT 18-24 HRS): SARS-CoV-2, NAA: NOT DETECTED

## 2018-04-24 MED ORDER — HYDROXYCHLOROQUINE SULFATE 200 MG PO TABS
200.0000 mg | ORAL_TABLET | Freq: Every day | ORAL | Status: DC
  Administered 2018-04-24 – 2018-04-26 (×3): 200 mg via ORAL
  Filled 2018-04-24 (×3): qty 1

## 2018-04-24 MED ORDER — TICAGRELOR 90 MG PO TABS
90.0000 mg | ORAL_TABLET | Freq: Two times a day (BID) | ORAL | Status: DC
  Administered 2018-04-24 – 2018-04-26 (×5): 90 mg via ORAL
  Filled 2018-04-24 (×5): qty 1

## 2018-04-24 MED ORDER — ATORVASTATIN CALCIUM 80 MG PO TABS
80.0000 mg | ORAL_TABLET | Freq: Every day | ORAL | Status: DC
  Administered 2018-04-24 – 2018-04-25 (×2): 80 mg via ORAL
  Filled 2018-04-24 (×3): qty 1

## 2018-04-24 MED ORDER — PREDNISONE 5 MG PO TABS
5.0000 mg | ORAL_TABLET | Freq: Every day | ORAL | Status: DC
  Administered 2018-04-24 – 2018-04-26 (×3): 5 mg via ORAL
  Filled 2018-04-24 (×3): qty 1

## 2018-04-24 MED ORDER — LORAZEPAM 2 MG/ML IJ SOLN: 0.5000 mg | Freq: Four times a day (QID) | INTRAMUSCULAR | Status: DC | PRN

## 2018-04-24 MED ORDER — VITAMIN D (ERGOCALCIFEROL) 1.25 MG (50000 UNIT) PO CAPS
50000.0000 [IU] | ORAL_CAPSULE | ORAL | Status: DC
  Administered 2018-04-25: 10:00:00 50000 [IU] via ORAL
  Filled 2018-04-24: qty 1

## 2018-04-24 MED ORDER — LEVETIRACETAM 500 MG PO TABS
1000.0000 mg | ORAL_TABLET | Freq: Every day | ORAL | Status: DC
  Administered 2018-04-24 – 2018-04-26 (×3): 1000 mg via ORAL
  Filled 2018-04-24 (×3): qty 2

## 2018-04-24 MED ORDER — MIRTAZAPINE 15 MG PO TABS
15.0000 mg | ORAL_TABLET | Freq: Every day | ORAL | Status: DC
  Administered 2018-04-24 – 2018-04-25 (×2): 15 mg via ORAL
  Filled 2018-04-24 (×2): qty 1

## 2018-04-24 MED ORDER — ENSURE ENLIVE PO LIQD
237.0000 mL | Freq: Two times a day (BID) | ORAL | Status: DC
  Administered 2018-04-25 – 2018-04-26 (×4): 237 mL via ORAL

## 2018-04-24 MED ORDER — CARVEDILOL 12.5 MG PO TABS
12.5000 mg | ORAL_TABLET | Freq: Two times a day (BID) | ORAL | Status: DC
  Administered 2018-04-24 (×2): 12.5 mg via ORAL
  Filled 2018-04-24 (×3): qty 1

## 2018-04-24 NOTE — Progress Notes (Signed)
Comfortable on [place] O2. Poorly oriented. Calm and pleasant  Vitals:   04/24/18 1108 04/24/18 1130 04/24/18 1200 04/24/18 1231  BP: (!) 165/78  (!) 160/74 (!) 156/70  Pulse: 97  100 100  Resp:   16 17  Temp:  97.7 F (36.5 C)  99.2 F (37.3 C)  TempSrc:  Axillary  Oral  SpO2:   94% 100%  Weight:      Height:        Gen: NAD HEENT: NCAT, sclerae white Neck: No JVD noted Lungs: breath sounds full, no wheezes, minimal bibasilar crackles Cardiovascular: RRR, no murmurs Abdomen: Soft, nontender, normal BS Ext: without clubbing, cyanosis, edema Neuro: grossly intact Skin: Limited exam, no lesions noted   BMP Latest Ref Rng & Units 04/24/2018 04/22/2018 04/22/2018  Glucose 70 - 99 mg/dL 91 127(H) -  BUN 8 - 23 mg/dL 37(H) 38(H) -  Creatinine 0.44 - 1.00 mg/dL 1.91(H) 2.07(H) -  Sodium 135 - 145 mmol/L 147(H) 144 146(H)  Potassium 3.5 - 5.1 mmol/L 4.4 3.5 3.5  Chloride 98 - 111 mmol/L 116(H) 115(H) -  CO2 22 - 32 mmol/L 19(L) 15(L) -  Calcium 8.9 - 10.3 mg/dL 8.4(L) 7.4(L) -   CBC Latest Ref Rng & Units 04/24/2018 04/23/2018 04/22/2018  WBC 4.0 - 10.5 K/uL 17.0(H) 17.8(H) 15.6(H)  Hemoglobin 12.0 - 15.0 g/dL 8.3(L) 8.2(L) 8.5(L)  Hematocrit 36.0 - 46.0 % 27.5(L) 26.7(L) 26.3(L)  Platelets 150 - 400 K/uL 287 275 332   CXR: no new film  IMPRESSION: Acute hypoxemic respiratory failure Pulmonary edema, resolving R/O'd for COVID-19 Recent MI and RCA stent Acute/chronic diastolic heart failure Acute/chronic renal insufficiency - Cr improving slightly Episodic hypertension History of rheumatoid arthritis, chronic prednisone 5 mg/day and chronic hydroxychloroquine Acute encephalopathy/agitated delirium, improved.  Suspect component of underlying dementia  PLAN/REC: Cont supplemental oxygen as needed to maintain SPO2 >90% Cont Labetalol as needed Resume home antihypertensive meds Monitor BMET intermittently Monitor I/Os Correct electrolytes as indicated Resume prednisone,  hydroxychloroquine Cont Low-dose lorazepam Transfer to Med-Surg floor  After transfer, PCCM will sign off. Please call if we can be of further assistance  Merton Border, MD PCCM service Mobile [phone] Pager [phone] 04/24/2018 2:33 PM

## 2018-04-24 NOTE — Progress Notes (Signed)
Nutrition Follow-up  DOCUMENTATION CODES:   Not applicable  INTERVENTION:    Ensure Enlive po BID, each supplement provides 350 kcal and 20 grams of protein  NUTRITION DIAGNOSIS:   Inadequate oral intake related to acute illness as evidenced by NPO status.  Ongoing, diet just advanced  GOAL:   Patient will meet greater than or equal to 90% of their needs  Progressing  MONITOR:   PO intake, Supplement acceptance, Labs  ASSESSMENT:   78 yo female admitted from SNF with acute respiratory failure with infiltrates requiring intubation COVID-19 results pending, AKI on CKD. Pt admitted Feb 2020 for MI and CAD s/p stenting. Admitted March 2020 for dehydration and FTT. PMH includes CKD, MI, HTN; surigcal hx includes parathyroidectomy  COVID results negative. Extubated 4/7.  Diet advanced to regular this morning. Expect intake will be poor due to acute respiratory illness. Will add PO supplement. Labs reviewed. Sodium 147 (H), BUN 37 (H), creatinine 1.91 (H) Medications reviewed.  NUTRITION - FOCUSED PHYSICAL EXAM:  unable to complete  Diet Order:   Diet Order            Diet regular Room service appropriate? Yes; Fluid consistency: Thin  Diet effective now              EDUCATION NEEDS:   Not appropriate for education at this time  Skin:  Skin Assessment: Reviewed RN Assessment  Last BM:  no documented BM  Height:   Ht Readings from Last 1 Encounters:  04/24/18 5\' 1"  (1.549 m)    Weight:   Wt Readings from Last 1 Encounters:  04/24/18 49.8 kg    Ideal Body Weight:  47.7 kg  BMI:  Body mass index is 20.74 kg/m.  Estimated Nutritional Needs:   Kcal:  1350-1550  Protein:  65-80 gm  Fluid:  >/= 1.5 L    Molli Barrows, RD, LDN, [place] Pager [phone] After Hours Pager [phone]

## 2018-04-24 NOTE — Progress Notes (Signed)
Patient Admitted to the unit from 49M. Patient alert to self and location. Oriented to unit room and call bell. Skin tear noted to right arm. Patient denies any needs at this time.

## 2018-04-25 ENCOUNTER — Telehealth: Payer: Self-pay

## 2018-04-25 DIAGNOSIS — R6251 Failure to thrive (child): Secondary | ICD-10-CM

## 2018-04-25 DIAGNOSIS — R627 Adult failure to thrive: Secondary | ICD-10-CM

## 2018-04-25 DIAGNOSIS — I5031 Acute diastolic (congestive) heart failure: Secondary | ICD-10-CM

## 2018-04-25 DIAGNOSIS — I2510 Atherosclerotic heart disease of native coronary artery without angina pectoris: Secondary | ICD-10-CM

## 2018-04-25 LAB — PROTIME-INR
INR: 1.3 — ABNORMAL HIGH (ref 0.8–1.2)
Prothrombin Time: 15.6 seconds — ABNORMAL HIGH (ref 11.4–15.2)

## 2018-04-25 LAB — BASIC METABOLIC PANEL
Anion gap: 10 (ref 5–15)
BUN: 32 mg/dL — ABNORMAL HIGH (ref 8–23)
CO2: 22 mmol/L (ref 22–32)
Calcium: 8.4 mg/dL — ABNORMAL LOW (ref 8.9–10.3)
Chloride: 115 mmol/L — ABNORMAL HIGH (ref 98–111)
Creatinine, Ser: 1.57 mg/dL — ABNORMAL HIGH (ref 0.44–1.00)
GFR calc Af Amer: 36 mL/min — ABNORMAL LOW (ref >60)
GFR calc non Af Amer: 31 mL/min — ABNORMAL LOW (ref >60)
Glucose, Bld: 91 mg/dL (ref 70–99)
Potassium: 4.4 mmol/L (ref 3.5–5.1)
Sodium: 147 mmol/L — ABNORMAL HIGH (ref 135–145)

## 2018-04-25 MED ORDER — PANTOPRAZOLE SODIUM 40 MG PO TBEC
40.0000 mg | DELAYED_RELEASE_TABLET | Freq: Every day | ORAL | Status: DC
  Administered 2018-04-25 – 2018-04-26 (×2): 40 mg via ORAL
  Filled 2018-04-25 (×2): qty 1

## 2018-04-25 MED ORDER — POLYSACCHARIDE IRON COMPLEX 150 MG PO CAPS
150.0000 mg | ORAL_CAPSULE | Freq: Every day | ORAL | Status: DC
  Administered 2018-04-25 – 2018-04-26 (×2): 150 mg via ORAL
  Filled 2018-04-25 (×2): qty 1

## 2018-04-25 MED ORDER — CARVEDILOL 25 MG PO TABS
25.0000 mg | ORAL_TABLET | Freq: Two times a day (BID) | ORAL | Status: DC
  Administered 2018-04-25 – 2018-04-26 (×3): 25 mg via ORAL
  Filled 2018-04-25 (×3): qty 1

## 2018-04-25 MED ORDER — NITROGLYCERIN 0.4 MG SL SUBL: 0.4000 mg | SUBLINGUAL_TABLET | SUBLINGUAL | Status: DC | PRN

## 2018-04-25 MED ORDER — CHLORHEXIDINE GLUCONATE 0.12 % MT SOLN
OROMUCOSAL | Status: AC
  Administered 2018-04-25: 15 mL via OROMUCOSAL
  Filled 2018-04-25: qty 15

## 2018-04-25 MED ORDER — FUROSEMIDE 40 MG PO TABS
40.0000 mg | ORAL_TABLET | Freq: Every day | ORAL | Status: DC
  Administered 2018-04-25 – 2018-04-26 (×2): 40 mg via ORAL
  Filled 2018-04-25 (×2): qty 1

## 2018-04-25 MED ORDER — BOOST PLUS PO LIQD
237.0000 mL | Freq: Three times a day (TID) | ORAL | Status: DC
  Administered 2018-04-25 – 2018-04-26 (×5): 237 mL via ORAL
  Filled 2018-04-25 (×6): qty 237

## 2018-04-25 NOTE — Care Management Important Message (Signed)
Important Message  Patient Details  Name: [name] MRN: [id number] Date of Birth: February 09, 1940   Medicare Important Message Given:  Yes    Orbie Pyo 04/25/2018, 3:58 PM

## 2018-04-25 NOTE — Evaluation (Addendum)
Physical Therapy Evaluation Patient Details Name: [name] MRN: [id number] DOB: [birth year] Today's Date: 04/25/2018   History of Present Illness  Pt is a 78 y/o female admitted from SNF secondary to respiratory failure. Pt required intubation from 4/5-4/7. PMH including but not limited to CAD s/p PCI February 2020, seizure disorder, CKD stage III, rheumatoid arthritis.    Clinical Impression  Pt presented sitting OOB in recliner chair, awake and willing to participate in therapy session. Prior to admission, pt reported that she ambulated with use of RW and required assistance with ADLs. Pt admitted from SNF, but usually lives with her husband and grandson in a single level home with three steps to enter (no rail). Pt currently very limited secondary to weakness. Pt would continue to benefit from skilled physical therapy services at this time while admitted and after d/c to address the below listed limitations in order to improve overall safety and independence with functional mobility.  Of note, pt on RA throughout with SPO2 decreasing to 89% with activity, otherwise maintaining >93%.     Follow Up Recommendations SNF;Supervision for mobility/OOB    Equipment Recommendations  None recommended by PT    Recommendations for Other Services       Precautions / Restrictions Precautions Precautions: Fall Restrictions Weight Bearing Restrictions: No      Mobility  Bed Mobility               General bed mobility comments: pt OOB in recliner chair upon arrival  Transfers Overall transfer level: Needs assistance Equipment used: Rolling walker (2 wheeled) Transfers: Sit to/from Stand Sit to Stand: Mod assist;Min assist;Min guard         General transfer comment: pt performed sit<>stand from recliner chair x4, progressing from initially needing mod A and then min A on second trial, min guard for safety for last two trials  Ambulation/Gait             General Gait  Details: pt able to take steps in place with RW and min-mod A for stability  Stairs            Wheelchair Mobility    Modified Rankin (Stroke Patients Only)       Balance Overall balance assessment: Needs assistance Sitting-balance support: Feet supported Sitting balance-Leahy Scale: Fair     Standing balance support: Bilateral upper extremity supported Standing balance-Leahy Scale: Poor                               Pertinent Vitals/Pain Pain Assessment: No/denies pain    Home Living Family/patient expects to be discharged to:: Skilled nursing facility Living Arrangements: Spouse/significant other;Other relatives               Additional Comments: pt lives with husband and grandson in a single level home with three steps to enter    Prior Function Level of Independence: Needs assistance   Gait / Transfers Assistance Needed: ambulates with RW  ADL's / Homemaking Assistance Needed: requires assistance for ADLs/IADLs        Hand Dominance        Extremity/Trunk Assessment   Upper Extremity Assessment Upper Extremity Assessment: Generalized weakness    Lower Extremity Assessment Lower Extremity Assessment: Generalized weakness    Cervical / Trunk Assessment Cervical / Trunk Assessment: Kyphotic  Communication   Communication: No difficulties  Cognition Arousal/Alertness: Awake/alert Behavior During Therapy: Flat affect Overall Cognitive Status: Impaired/Different from  baseline Area of Impairment: Safety/judgement;Problem solving                         Safety/Judgement: Decreased awareness of deficits   Problem Solving: Slow processing;Decreased initiation;Difficulty sequencing;Requires verbal cues        General Comments      Exercises     Assessment/Plan    PT Assessment Patient needs continued PT services  PT Problem List Decreased strength;Decreased activity tolerance;Decreased balance;Decreased  mobility;Decreased coordination;Decreased knowledge of use of DME;Decreased safety awareness;Decreased knowledge of precautions       PT Treatment Interventions DME instruction;Gait training;Stair training;Functional mobility training;Therapeutic activities;Therapeutic exercise;Balance training;Neuromuscular re-education;Patient/family education    PT Goals (Current goals can be found in the Care Plan section)  Acute Rehab PT Goals Patient Stated Goal: to go home PT Goal Formulation: With patient Time For Goal Achievement: 05/09/18 Potential to Achieve Goals: Fair    Frequency Min 2X/week   Barriers to discharge        Co-evaluation               AM-PAC PT "6 Clicks" Mobility  Outcome Measure Help needed turning from your back to your side while in a flat bed without using bedrails?: A Lot Help needed moving from lying on your back to sitting on the side of a flat bed without using bedrails?: A Lot Help needed moving to and from a bed to a chair (including a wheelchair)?: A Lot Help needed standing up from a chair using your arms (e.g., wheelchair or bedside chair)?: A Little Help needed to walk in hospital room?: A Lot Help needed climbing 3-5 steps with a railing? : Total 6 Click Score: 12    End of Session Equipment Utilized During Treatment: Gait belt Activity Tolerance: Patient tolerated treatment well Patient left: in chair;with call bell/phone within reach;with chair alarm set Nurse Communication: Mobility status PT Visit Diagnosis: Other abnormalities of gait and mobility (R26.89);Muscle weakness (generalized) (M62.81)    Time: 7614-7092 PT Time Calculation (min) (ACUTE ONLY): 19 min   Charges:   PT Evaluation $PT Eval Moderate Complexity: 1 Mod          Sherie Don, PT, DPT  Acute Rehabilitation Services Pager [phone] Office [place] 04/25/2018, 12:55 PM

## 2018-04-25 NOTE — Evaluation (Signed)
Occupational Therapy Evaluation Patient Details Name: [name] MRN: [id number] DOB: [birth year] Today's Date: 04/25/2018    History of Present Illness Pt is a 78 y/o female admitted from SNF secondary to respiratory failure. Pt required intubation from 4/5-4/7. PMH including but not limited to CAD s/p PCI February 2020, seizure disorder, CKD stage III, rheumatoid arthritis.   Clinical Impression   Pt PTA: pt reports coming from SNF and was receiving therapy. Pt currently limited by decreased strength, decreased balance and increased need from caregivers for ADL tasks. Pt taking a few steps, but unable to have step through gait pattern for ambulation. Pt greatly requires continued OT skilled services for ADL, mobility and safety in SNF setting. OT to follow acutely.    Follow Up Recommendations  SNF;Supervision/Assistance - 24 hour    Equipment Recommendations  Other (comment)(to be determined)    Recommendations for Other Services       Precautions / Restrictions Precautions Precautions: Fall Restrictions Weight Bearing Restrictions: No      Mobility Bed Mobility Overal bed mobility: Needs Assistance Bed Mobility: Supine to Sit     Supine to sit: Mod assist        Transfers Overall transfer level: Needs assistance Equipment used: Rolling walker (2 wheeled) Transfers: Sit to/from Stand Sit to Stand: Mod assist;Min assist;Min guard         General transfer comment: Increased time and multiple attempts for each sit to stand    Balance Overall balance assessment: Needs assistance Sitting-balance support: Feet supported Sitting balance-Leahy Scale: Fair     Standing balance support: Bilateral upper extremity supported Standing balance-Leahy Scale: Poor                             ADL either performed or assessed with clinical judgement   ADL Overall ADL's : Needs assistance/impaired Eating/Feeding: Set up   Grooming: Set up;Sitting    Upper Body Bathing: Minimal assistance;Sitting   Lower Body Bathing: Moderate assistance;Sitting/lateral leans;Sit to/from stand   Upper Body Dressing : Minimal assistance;Sitting   Lower Body Dressing: Moderate assistance;Sitting/lateral leans;Sit to/from stand   Toilet Transfer: Minimal assistance;RW   Toileting- Clothing Manipulation and Hygiene: Moderate assistance;Sitting/lateral lean;Sit to/from stand;Cueing for safety       Functional mobility during ADLs: Moderate assistance;Rolling walker General ADL Comments: Mod A for transfers and increased assist for ADL     Vision Baseline Vision/History: No visual deficits Vision Assessment?: No apparent visual deficits     Perception     Praxis      Pertinent Vitals/Pain Pain Assessment: No/denies pain     Hand Dominance     Extremity/Trunk Assessment Upper Extremity Assessment Upper Extremity Assessment: Generalized weakness   Lower Extremity Assessment Lower Extremity Assessment: Generalized weakness   Cervical / Trunk Assessment Cervical / Trunk Assessment: Kyphotic   Communication Communication Communication: No difficulties   Cognition Arousal/Alertness: Awake/alert Behavior During Therapy: Flat affect Overall Cognitive Status: Impaired/Different from baseline Area of Impairment: Safety/judgement;Problem solving                         Safety/Judgement: Decreased awareness of deficits   Problem Solving: Slow processing;Decreased initiation;Difficulty sequencing;Requires verbal cues     General Comments       Exercises     Shoulder Instructions      Home Living Family/patient expects to be discharged to:: Skilled nursing facility Living Arrangements: Spouse/significant other;Other relatives  Additional Comments: pt lives with husband and grandson in a single level home with three steps to enter      Prior Functioning/Environment Level of  Independence: Needs assistance  Gait / Transfers Assistance Needed: ambulates with RW ADL's / Homemaking Assistance Needed: requires assistance for ADLs/IADLs            OT Problem List: Decreased strength;Decreased range of motion;Decreased activity tolerance;Impaired balance (sitting and/or standing);Decreased safety awareness;Decreased knowledge of use of DME or AE      OT Treatment/Interventions: Self-care/ADL training;Therapeutic exercise;DME and/or AE instruction;Therapeutic activities;Patient/family education;Balance training    OT Goals(Current goals can be found in the care plan section) Acute Rehab OT Goals Patient Stated Goal: to go home OT Goal Formulation: With patient Time For Goal Achievement: 05/09/18 Potential to Achieve Goals: Fair ADL Goals Pt Will Perform Grooming: with modified independence Pt Will Perform Upper Body Dressing: with modified independence;standing Pt Will Perform Lower Body Dressing: with modified independence;sit to/from stand Pt Will Perform Toileting - Clothing Manipulation and hygiene: with supervision;sit to/from stand Pt/caregiver will Perform Home Exercise Program: Both right and left upper extremity;With written HEP provided;Independently Additional ADL Goal #1: Pt will perform ADL functional mobility a household distance with RW and fair balance  OT Frequency: Min 3X/week   Barriers to D/C: Decreased caregiver support          Co-evaluation              AM-PAC OT "6 Clicks" Daily Activity     Outcome Measure Help from another person eating meals?: None Help from another person taking care of personal grooming?: A Little Help from another person toileting, which includes using toliet, bedpan, or urinal?: A Little Help from another person bathing (including washing, rinsing, drying)?: A Lot Help from another person to put on and taking off regular upper body clothing?: A Little Help from another person to put on and taking off  regular lower body clothing?: A Lot 6 Click Score: 17   End of Session Equipment Utilized During Treatment: Gait belt;Rolling walker Nurse Communication: Mobility status  Activity Tolerance: Patient tolerated treatment well;Patient limited by lethargy Patient left: in chair;with call bell/phone within reach;with chair alarm set  OT Visit Diagnosis: Unsteadiness on feet (R26.81);Other abnormalities of gait and mobility (R26.89);Muscle weakness (generalized) (M62.81);Adult, failure to thrive (R62.7)                Time: 0805-0828 OT Time Calculation (min): 23 min Charges:  OT General Charges $OT Visit: 1 Visit OT Evaluation $OT Eval Moderate Complexity: 1 Mod OT Treatments $Self Care/Home Management : 8-22 mins  Ebony Hail Harold Hedge) Marsa Aris OTR/L Acute Rehabilitation Services Pager: [phone] Office: [place] 04/25/2018, 4:53 PM

## 2018-04-25 NOTE — TOC Initial Note (Signed)
Transition of Care [facility]) - Initial/Assessment Note    Patient Details  Name: [name] MRN: [id number] Date of Birth: 20-Dec-1940  [facility]) CM/SW Contact:    Benard Halsted, LCSW Phone Number: 04/25/2018, 10:59 AM  Clinical Narrative:                 Patient is a 78 y.o. female with history of CAD s/p PCI February 2020, seizure disorder, CKD stage III, rheumatoid arthritis presented with acute hypoxic respiratory failure requiring intubation/mechanical ventilation secondary to pulmonary edema.  COVID-19 negative.  CSW received consult for possible SNF placement at time of discharge. CSW spoke with patient's granddaughter regarding discharge plan. Patient's granddaughter reported that patient has been at [facility] for the past few weeks but she is unsure if the patient's spouse will want her to return depending on insurance allowance. She reports that she knows the patient would prefer to return home. CSW will follow up to see if patient will soon be in copay days at [facility] and will let spouse know. Patient's granddaughter expressed being hopeful for patient to feel better soon. No further questions reported at this time. CSW to continue to follow and assist with discharge planning needs.   Expected Discharge Plan: Skilled Nursing Facility Barriers to Discharge: Continued Medical Work up   Patient Goals and CMS Choice Patient states their goals for this hospitalization and ongoing recovery are:: Rehab CMS Medicare.gov Compare Post Acute Care list provided to:: Other (Comment Required)(Spouse and granddaughter) Choice offered to / list presented to : Spouse  Expected Discharge Plan and Services Expected Discharge Plan: [place] In-house Referral: Clinical Social Work Discharge Planning Services: NA Post Acute Care Choice: [place] Living arrangements for the past 2 months: [place]                 DME Arranged:  N/A DME Agency: NA HH Arranged: NA [place] Agency: NA  Prior Living Arrangements/Services Living arrangements for the past 2 months: [place] Lives with:: Spouse Patient language and need for interpreter reviewed:: Yes Do you feel safe going back to the place where you live?: Yes      Need for Family Participation in Patient Care: Yes (Comment) Care giver support system in place?: Yes (comment)   Criminal Activity/Legal Involvement Pertinent to Current Situation/Hospitalization: No - Comment as needed  Activities of Daily Living      Permission Sought/Granted Permission sought to share information with : Facility Sport and exercise psychologist, Family Supports Permission granted to share information with : Yes, Verbal Permission Granted  Share Information with NAME: Marg Macmaster  Permission granted to share info w AGENCY: SNFs  Permission granted to share info w Relationship: Spouse  Permission granted to share info w Contact Information: [phone]  Emotional Assessment Appearance:: Appears stated age Attitude/Demeanor/Rapport: Unable to Assess Affect (typically observed): Unable to Assess Orientation: : Oriented to Self, Oriented to Place Alcohol / Substance Use: Not Applicable Psych Involvement: No (comment)  Admission diagnosis:  Hypoxia [R09.02] Acute respiratory failure with hypoxia ([place]) [J96.01] Respiratory failure ([place]) [J96.90] Patient Active Problem List   Diagnosis Date Noted  . Acute hypoxemic respiratory failure ([place]) 04/21/2018  . Suspected Covid-19 Virus Infection 04/21/2018  . Protein-calorie malnutrition, severe 03/31/2018  . Dehydration 03/29/2018  . Failure to thrive in adult 03/29/2018  . Acute blood loss anemia   . Occult blood in stools   . Gastric AVM   . STEMI (ST elevation myocardial infarction) (  [place]) 02/28/2018  . Acute inferior myocardial infarction ([place]) 02/28/2018  . Respiratory arrest ([place])   . Essential hypertension   .  Respiratory failure ([place])   . SIRS (systemic inflammatory response syndrome) ([place]) 08/11/2016  . Nausea vomiting and diarrhea 08/11/2016  . Renal failure (ARF), acute on chronic (HCC) 08/11/2016  . High risk medications (not anticoagulants) long-term use 04/20/2016  . Bilateral chronic knee pain 04/20/2016  . Pain in left elbow 04/20/2016  . Chronic kidney disease, stage III (moderate) ([place]) 08/03/2015  . Encounter for general adult medical examination without abnormal findings 08/03/2015  . 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 Other long term (current) drug therapy 08/03/2015  . Pain in right shoulder 08/03/2015  . Postmenopausal bleeding 08/03/2015  . Vitamin D deficiency 08/03/2015  . Localized pain of right shoulder joint 08/03/2015  . Primary hyperparathyroidism ([place]) 07/16/2015  . Hyperparathyroidism, primary ([place]) 07/14/2015  . Gait disturbance 03/24/2015  . Neck pain 03/24/2015  . Myalgia and myositis 03/24/2015  . Acute kidney injury superimposed on chronic kidney disease ([name]) 02/06/2015  . Leukocytosis 02/06/2015  . Seizure ([place]) 02/05/2015  . Acute encephalopathy 02/05/2015  . Abnormal MRI of head 02/02/2014  . Cognitive changes 02/02/2014  . Migraine without aura and without status migrainosus, not intractable 02/02/2014  . LOC (loss of consciousness) ([place])   . Syncope 01/12/2014  . Syncope and collapse 01/12/2014  . Hypertensive urgency 01/12/2014  . Rheumatoid arthritis ([place]) 01/12/2014  . Depression 01/12/2014  . Hyperlipidemia 01/12/2014   PCP:  Glendale Chard, MD Pharmacy:  No Pharmacies Listed    Social Determinants of Health (SDOH) Interventions    Readmission Risk Interventions Readmission Risk Prevention Plan 04/25/2018 03/30/2018  Transportation Screening Complete Complete  PCP or Specialist Appt within 5-7 Days - Not Complete  Not Complete  comments - [place] Screening - Not Complete  Medication Review (RN CM) - Not Complete  Medication Review ([place]) Complete -  PCP or Specialist appointment within 3-5 days of discharge Complete -  [place] or Home Care Consult Complete -  SW Recovery Care/Counseling Consult Complete -  Palliative Care Screening Not Applicable -  [place] Complete -  Some recent data might be hidden

## 2018-04-25 NOTE — Progress Notes (Signed)
1504 Foley catheter removed per order.

## 2018-04-25 NOTE — NC FL2 (Signed)
[place] LEVEL OF CARE SCREENING TOOL     IDENTIFICATION  Patient Name: [name] Birthdate: 1940-09-06 Sex: female Admission Date (Current Location): 04/21/2018  [facility] and [place] Number:  Herbalist and Address:  [facility] [place] [place] [address], [place], [place] [address]      Provider Number: [id number]  Attending Physician Name and Address:  Jonetta Osgood, MD  Relative Name and Phone Number:       Current Level of Care: Hospital Recommended Level of Care: [place] Prior Approval Number:    Date Approved/Denied:   PASRR Number: [id number]  Discharge Plan: SNF    Current Diagnoses: Patient Active Problem List   Diagnosis Date Noted  . Acute hypoxemic respiratory failure ([place]) 04/21/2018  . Suspected Covid-19 Virus Infection 04/21/2018  . Protein-calorie malnutrition, severe 03/31/2018  . Dehydration 03/29/2018  . Failure to thrive in adult 03/29/2018  . Acute blood loss anemia   . Occult blood in stools   . Gastric AVM   . STEMI (ST elevation myocardial infarction) ([place]) 02/28/2018  . Acute inferior myocardial infarction ([place]) 02/28/2018  . Respiratory arrest ([place])   . Essential hypertension   . Respiratory failure ([place])   . SIRS (systemic inflammatory response syndrome) ([place]) 08/11/2016  . Nausea vomiting and diarrhea 08/11/2016  . Renal failure (ARF), acute on chronic (HCC) 08/11/2016  . High risk medications (not anticoagulants) long-term use 04/20/2016  . Bilateral chronic knee pain 04/20/2016  . Pain in left elbow 04/20/2016  . Chronic kidney disease, stage III (moderate) ([place]) 08/03/2015  . Encounter for general adult medical examination without abnormal findings 08/03/2015  . 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 Other long term (current) drug therapy 08/03/2015  . Pain in right  shoulder 08/03/2015  . Postmenopausal bleeding 08/03/2015  . Vitamin D deficiency 08/03/2015  . Localized pain of right shoulder joint 08/03/2015  . Primary hyperparathyroidism ([place]) 07/16/2015  . Hyperparathyroidism, primary ([place]) 07/14/2015  . Gait disturbance 03/24/2015  . Neck pain 03/24/2015  . Myalgia and myositis 03/24/2015  . Acute kidney injury superimposed on chronic kidney disease ([place]) 02/06/2015  . Leukocytosis 02/06/2015  . Seizure ([place]) 02/05/2015  . Acute encephalopathy 02/05/2015  . Abnormal MRI of head 02/02/2014  . Cognitive changes 02/02/2014  . Migraine without aura and without status migrainosus, not intractable 02/02/2014  . LOC (loss of consciousness) ([place])   . Syncope 01/12/2014  . Syncope and collapse 01/12/2014  . Hypertensive urgency 01/12/2014  . Rheumatoid arthritis ([place]) 01/12/2014  . Depression 01/12/2014  . Hyperlipidemia 01/12/2014    Orientation RESPIRATION BLADDER Height & Weight     Self, Time  O2(Nasal cannula .5L) Incontinent, Indwelling catheter Weight: 99 lb 13.9 oz (45.3 kg) Height:  '5\' 1"'  (154.9 cm)  BEHAVIORAL SYMPTOMS/MOOD NEUROLOGICAL BOWEL NUTRITION STATUS    Convulsions/Seizures Continent Diet(Please see DC Summary)  AMBULATORY STATUS COMMUNICATION OF NEEDS Skin   Extensive Assist Verbally Normal                       Personal Care Assistance Level of Assistance  Bathing, Dressing, Feeding Bathing Assistance: Maximum assistance Feeding assistance: Limited assistance Dressing Assistance: Limited assistance     Functional Limitations Info  Hearing, Sight, Speech Sight Info: Adequate Hearing Info: Adequate Speech Info: Adequate    SPECIAL CARE FACTORS FREQUENCY  PT (By licensed PT),  OT (By licensed OT)     PT Frequency: 5x/week OT Frequency: 3x/week            Contractures Contractures Info: Not present    Additional Factors Info  Code Status, Allergies Code Status Info: Partial Allergies Info: NKA            Current Medications (04/25/2018):  This is the current hospital active medication list Current Facility-Administered Medications  Medication Dose Route Frequency Provider Last Rate Last Dose  . atorvastatin (LIPITOR) tablet 80 mg  80 mg Oral q1800 Wilhelmina Mcardle, MD   80 mg at 04/24/18 1730  . carvedilol (COREG) tablet 25 mg  25 mg Oral BID WC Jonetta Osgood, MD   25 mg at 04/25/18 0959  . chlorhexidine gluconate (MEDLINE KIT) (PERIDEX) 0.12 % solution 15 mL  15 mL Mouth Rinse BID Brand Males, MD   15 mL at 04/25/18 0959  . enoxaparin (LOVENOX) injection 30 mg  30 mg Subcutaneous Q24H Brand Males, MD   30 mg at 04/24/18 2115  . feeding supplement (ENSURE ENLIVE) (ENSURE ENLIVE) liquid 237 mL  237 mL Oral BID BM Icard, Bradley L, DO   237 mL at 04/25/18 0950  . furosemide (LASIX) tablet 40 mg  40 mg Oral Daily Jonetta Osgood, MD   40 mg at 04/25/18 0958  . hydroxychloroquine (PLAQUENIL) tablet 200 mg  200 mg Oral Daily Wilhelmina Mcardle, MD   200 mg at 04/25/18 5790  . iron polysaccharides (NIFEREX) capsule 150 mg  150 mg Oral Daily Jonetta Osgood, MD   150 mg at 04/25/18 0951  . labetalol (NORMODYNE,TRANDATE) injection 10 mg  10 mg Intravenous Q10 min PRN Wilhelmina Mcardle, MD   10 mg at 04/24/18 0835  . labetalol (NORMODYNE,TRANDATE) injection 10 mg  10 mg Intravenous Q2H PRN Wilhelmina Mcardle, MD   10 mg at 04/24/18 0415  . lactose free nutrition (BOOST PLUS) liquid 237 mL  237 mL Oral TID BM Ghimire, Henreitta Leber, MD   237 mL at 04/25/18 1000  . levETIRAcetam (KEPPRA) tablet 1,000 mg  1,000 mg Oral Daily Wilhelmina Mcardle, MD   1,000 mg at 04/25/18 0951  . LORazepam (ATIVAN) injection 0.5 mg  0.5 mg Intravenous Q6H PRN Wilhelmina Mcardle, MD      . MEDLINE mouth rinse  15 mL Mouth Rinse TID Icard, Bradley L, DO   15 mL at 04/25/18 0959  . mirtazapine (REMERON) tablet 15 mg  15 mg Oral QHS Wilhelmina Mcardle, MD   15 mg at 04/24/18 2116  . nitroGLYCERIN (NITROSTAT) SL  tablet 0.4 mg  0.4 mg Sublingual Q5 min PRN Jonetta Osgood, MD      . pantoprazole (PROTONIX) EC tablet 40 mg  40 mg Oral Q1200 Ghimire, Shanker M, MD      . predniSONE (DELTASONE) tablet 5 mg  5 mg Oral Q breakfast Wilhelmina Mcardle, MD   5 mg at 04/25/18 3833  . ticagrelor (BRILINTA) tablet 90 mg  90 mg Oral BID Wilhelmina Mcardle, MD   90 mg at 04/25/18 3832  . Vitamin D (Ergocalciferol) (DRISDOL) capsule 50,000 Units  50,000 Units Oral Q7 days Wilhelmina Mcardle, MD   50,000 Units at 04/25/18 9191     Discharge Medications: Please see discharge summary for a list of discharge medications.  Relevant Imaging Results:  Relevant Lab Results:   Additional Information SSn: [place], LCSW

## 2018-04-25 NOTE — Progress Notes (Signed)
PROGRESS NOTE        PATIENT DETAILS Name: [name] Age: 78 y.o. Sex: female Date of Birth: 13-Apr-1940 Admit Date: 04/21/2018 Admitting Physician Shellia Cleverly, MD [id number], Bailey Mech, MD  Brief Narrative: Patient is a 78 y.o. female with history of CAD s/p PCI February 2020, seizure disorder, CKD stage III, rheumatoid arthritis presented with acute hypoxic respiratory failure requiring intubation/mechanical ventilation secondary to pulmonary edema.  COVID-19 negative.  Managed in the ICU-extubated on 4/7-upon further stability-transfer to the triad hospitalist service on 4/9.  See below for further details.  Subjective: Awake-alert-lying comfortably in bed.  Very frail-no chest pain or shortness of breath.  Assessment/Plan: Acute hypoxemic respiratory failure: Secondary to pulmonary edema/decompensated diastolic heart failure-responded well to IV diuretics, extubated on 4/7.  Plans are to titrate off to room air.COVID-19 negative.  Decompensated diastolic heart failure: Causing above-improved--6.8 L so far-current volume status appears to be is stable.  Acute metabolic encephalopathy: Suspect secondary to respiratory failure/AKI-awake and alert at baseline.  Per prior PCCM notes-suspect some component of underlying dementia/cognitive dysfunction.  AKI on CKD stage III: AKI likely hemodynamically mediated-creatinine improved and now close to usual baseline.  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  No anginal symptoms-troponins negative.  Hypertension: Blood pressure on the higher side-Increase Coreg to 25 mg twice daily.  Anemia: Secondary to CKD at baseline-probably worsened by acute illness-stable for now-continue intermittent monitoring.  Rheumatoid arthritis: Appears stable-continue Plaquenil.  Seizure disorder: Stable-continue  Keppra  History of GI bleeding in February 2020 (post PCI) secondary to AVMs: Continue Protonix  Failure to thrive syndrome/deconditioning: Appears very frail at baseline-probably has worsening deconditioning due to acute/critical illness.  Have ordered PT/OT.  May require SNF on discharge.  DVT Prophylaxis: Prophylactic Lovenox   Code Status: Partial Code  Family Communication: None at bedside  Disposition Plan: Remain inpatient-but will plan on Home health vs SNF on discharge-over the next few days  Antimicrobial agents: Anti-infectives (From admission, onward)   Start     Dose/Rate Route Frequency Ordered Stop   04/24/18 1000  hydroxychloroquine (PLAQUENIL) tablet 200 mg     200 mg Oral Daily 04/24/18 0849     04/23/18 0900  vancomycin (VANCOCIN) IVPB 1000 mg/200 mL premix  Status:  Discontinued    Note to Pharmacy:  Pharmacy to dose   1,000 mg 200 mL/hr over 60 Minutes Intravenous Every 48 hours 04/21/18 1500 04/22/18 0853   04/22/18 0900  ceFEPIme (MAXIPIME) 1 g in sodium chloride 0.9 % 100 mL IVPB  Status:  Discontinued    Note to Pharmacy:  Pharmacy to dose   1 g 200 mL/hr over 30 Minutes Intravenous Every 24 hours 04/21/18 1508 04/22/18 0907   04/21/18 1515  ceFEPIme (MAXIPIME) 1 g in sodium chloride 0.9 % 100 mL IVPB  Status:  Discontinued    Note to Pharmacy:  Pharmacy to dose   1 g 200 mL/hr over 30 Minutes Intravenous Every 12 hours 04/21/18 1500 04/21/18 1503   04/21/18 1515  ceFEPIme (MAXIPIME) 1 g in sodium chloride 0.9 % 100 mL IVPB  Status:  Discontinued    Note to Pharmacy:  pahrmacy to dose   1 g 200 mL/hr over 30 Minutes Intravenous Every 24 hours 04/21/18 1505 04/21/18 1508  04/21/18 0700  vancomycin (VANCOCIN) 1,250 mg in sodium chloride 0.9 % 250 mL IVPB     1,250 mg 166.7 mL/hr over 90 Minutes Intravenous  Once 04/21/18 0651 04/21/18 1053   04/21/18 0700  ceFEPIme (MAXIPIME) 1 g in sodium chloride 0.9 % 100 mL IVPB     1 g 200 mL/hr over 30 Minutes  Intravenous  Once 04/21/18 0651 04/21/18 1021      Procedures: None  CONSULTS:  pulmonary/intensive care  Time spent: 25- minutes-Greater than 50% of this time was spent in counseling, explanation of diagnosis, planning of further management, and coordination of care.  MEDICATIONS: Scheduled Meds:  atorvastatin  80 mg Oral q1800   carvedilol  12.5 mg Oral BID WC   chlorhexidine gluconate (MEDLINE KIT)  15 mL Mouth Rinse BID   enoxaparin (LOVENOX) injection  30 mg Subcutaneous Q24H   feeding supplement (ENSURE ENLIVE)  237 mL Oral BID BM   hydroxychloroquine  200 mg Oral Daily   levETIRAcetam  1,000 mg Oral Daily   mouth rinse  15 mL Mouth Rinse TID   mirtazapine  15 mg Oral QHS   predniSONE  5 mg Oral Q breakfast   ticagrelor  90 mg Oral BID   Vitamin D (Ergocalciferol)  50,000 Units Oral Q7 days   Continuous Infusions: PRN Meds:.labetalol, labetalol, LORazepam   PHYSICAL EXAM: Vital signs: Vitals:   04/24/18 1458 04/24/18 2059 04/25/18 0500 04/25/18 0602  BP: (!) 152/83 (!) 161/79  (!) 160/83  Pulse: 98 77  82  Resp: 16     Temp: 98.3 F (36.8 C) 99.1 F (37.3 C)  98.1 F (36.7 C)  TempSrc: Oral Oral  Oral  SpO2: 97% 100%  100%  Weight:   45.3 kg   Height:       Filed Weights   04/23/18 0323 04/24/18 0422 04/25/18 0500  Weight: 52.7 kg 49.8 kg 45.3 kg   Body mass index is 18.87 kg/m.   General appearance :Awake, alert, not in any distress HEENT: Atraumatic and Normocephalic Neck: supple, no JVD Resp:Good air entry bilaterally, no added sounds  CVS: S1 S2 regular GI: Bowel sounds present, Non tender and not distended with no gaurding, rigidity or rebound.No organomegaly Extremities: B/L Lower Ext shows no edema, both legs are warm to touch Neurology: Non focal Musculoskeletal:No digital cyanosis Skin:No Rash, warm and dry Wounds:N/A  I have personally reviewed following labs and imaging studies  LABORATORY DATA: CBC: Recent Labs  Lab  04/21/18 0627  04/21/18 0754 04/22/18 0205 04/22/18 0324 04/23/18 0715 04/24/18 0717  WBC 19.8*  --   --   --  15.6* 17.8* 17.0*  NEUTROABS 12.1*  --   --   --  11.4* 14.4* 13.6*  HGB 9.8*   < > 10.2* 8.2* 8.5* 8.2* 8.3*  HCT 32.6*   < > 30.0* 24.0* 26.3* 26.7* 27.5*  MCV 89.6  --   --   --  83.5 82.9 84.4  PLT 414*  --   --   --  332 275 287   < > = values in this interval not displayed.    Basic Metabolic Panel: Recent Labs  Lab 04/21/18 0627  04/21/18 0754 04/22/18 0205 04/22/18 0324 04/22/18 1158 04/22/18 1602 04/23/18 0715 04/24/18 0717 04/25/18 0212  NA 142   < > 145 146* 144  --   --   --  147* 147*  K 5.1   < > 4.2 3.5 3.5  --   --   --  4.4 4.4  CL 114*  --   --   --  115*  --   --   --  116* 115*  CO2 16*  --   --   --  15*  --   --   --  19* 22  GLUCOSE 207*  --   --   --  127*  --   --   --  91 91  BUN 33*  --   --   --  38*  --   --   --  37* 32*  CREATININE 1.75*  --   --   --  2.07*  --   --   --  1.91* 1.57*  CALCIUM 7.9*  --   --   --  7.4*  --   --   --  8.4* 8.4*  MG  --   --   --   --  1.1* 1.9 2.5* 2.1  --   --   PHOS  --   --   --   --  5.0* 5.1* 5.3* 4.8*  --   --    < > = values in this interval not displayed.    GFR: Estimated Creatinine Clearance: 20.8 mL/min (A) (by C-G formula based on SCr of 1.57 mg/dL (H)).  Liver Function Tests: Recent Labs  Lab 04/21/18 0627 04/22/18 0324  AST 49* 23  ALT 23 22  ALKPHOS 110 83  BILITOT 0.6 0.5  PROT 6.7 6.4*  ALBUMIN 2.6* 2.6*   No results for input(s): LIPASE, AMYLASE in the last 168 hours. No results for input(s): AMMONIA in the last 168 hours.  Coagulation Profile: Recent Labs  Lab 04/22/18 0028 04/25/18 0212  INR 2.5* 1.3*    Cardiac Enzymes: Recent Labs  Lab 04/21/18 0627 04/22/18 0324  TROPONINI <0.03 <0.03    BNP (last 3 results) No results for input(s): PROBNP in the last 8760 hours.  HbA1C: No results for input(s): HGBA1C in the last 72 hours.  CBG: Recent Labs    Lab 04/23/18 1555 04/23/18 1948 04/23/18 2348 04/24/18 0421 04/24/18 0807  GLUCAP 113* 109* 99 88 83    Lipid Profile: No results for input(s): CHOL, HDL, LDLCALC, TRIG, CHOLHDL, LDLDIRECT in the last 72 hours.  Thyroid Function Tests: No results for input(s): TSH, T4TOTAL, FREET4, T3FREE, THYROIDAB in the last 72 hours.  Anemia Panel: No results for input(s): VITAMINB12, FOLATE, FERRITIN, TIBC, IRON, RETICCTPCT in the last 72 hours.  Urine analysis:    Component Value Date/Time   COLORURINE STRAW (A) 04/21/2018 0907   APPEARANCEUR CLEAR 04/21/2018 0907   LABSPEC 1.005 04/21/2018 0907   PHURINE 6.0 04/21/2018 0907   GLUCOSEU NEGATIVE 04/21/2018 0907   HGBUR SMALL (A) 04/21/2018 0907   BILIRUBINUR NEGATIVE 04/21/2018 0907   KETONESUR NEGATIVE 04/21/2018 0907   PROTEINUR NEGATIVE 04/21/2018 0907   UROBILINOGEN 1.0 01/12/2014 2237   NITRITE NEGATIVE 04/21/2018 0907   LEUKOCYTESUR TRACE (A) 04/21/2018 0907    Sepsis Labs: Lactic Acid, Venous    Component Value Date/Time   LATICACIDVEN 5.0 (HH) 04/21/2018 0627    MICROBIOLOGY: Recent Results (from the past 240 hour(s))  Blood culture (routine x 2)     Status: None (Preliminary result)   Collection Time: 04/21/18  6:00 AM  Result Value Ref Range Status   Specimen Description BLOOD RIGHT ARM  Final   Special Requests   Final    BOTTLES DRAWN AEROBIC AND ANAEROBIC Blood Culture results may not be  optimal due to an excessive volume of blood received in culture bottles   Culture   Final    NO GROWTH 4 DAYS Performed at [facility], [place] [address], [place], [place] [address]    Report Status PENDING  Incomplete  Blood culture (routine x 2)     Status: None (Preliminary result)   Collection Time: 04/21/18  6:30 AM  Result Value Ref Range Status   Specimen Description BLOOD LEFT ARM  Final   Special Requests   Final    BOTTLES DRAWN AEROBIC ONLY Blood Culture adequate volume   Culture   Final    NO GROWTH 4  DAYS Performed at [facility], [place] [address] [place], [place] [address]    Report Status PENDING  Incomplete  Urine culture     Status: None   Collection Time: 04/21/18  9:07 AM  Result Value Ref Range Status   Specimen Description URINE, CATHETERIZED  Final   Special Requests NONE  Final   Culture   Final    NO GROWTH Performed at [facility], [address] [place], [place] [address]    Report Status 04/22/2018 FINAL  Final  Respiratory Panel by PCR     Status: None   Collection Time: 04/21/18  9:54 AM  Result Value Ref Range Status   Adenovirus NOT DETECTED NOT DETECTED Final   Coronavirus 229E NOT DETECTED NOT DETECTED Final    Comment: (NOTE) The Coronavirus on the Respiratory Panel, DOES NOT test for the novel  Coronavirus (2019 nCoV)    Coronavirus HKU1 NOT DETECTED NOT DETECTED Final   Coronavirus NL63 NOT DETECTED NOT DETECTED Final   Coronavirus OC43 NOT DETECTED NOT DETECTED Final   Metapneumovirus NOT DETECTED NOT DETECTED Final   Rhinovirus / Enterovirus NOT DETECTED NOT DETECTED Final   Influenza A NOT DETECTED NOT DETECTED Final   Influenza B NOT DETECTED NOT DETECTED Final   Parainfluenza Virus 1 NOT DETECTED NOT DETECTED Final   Parainfluenza Virus 2 NOT DETECTED NOT DETECTED Final   Parainfluenza Virus 3 NOT DETECTED NOT DETECTED Final   Parainfluenza Virus 4 NOT DETECTED NOT DETECTED Final   Respiratory Syncytial Virus NOT DETECTED NOT DETECTED Final   Bordetella pertussis NOT DETECTED NOT DETECTED Final   Chlamydophila pneumoniae NOT DETECTED NOT DETECTED Final   Mycoplasma pneumoniae NOT DETECTED NOT DETECTED Final    Comment: Performed at [facility], [place]. [address], [place], [place] [address]  Novel Coronavirus, NAA (hospital order; send-out to ref lab)     Status: None   Collection Time: 04/21/18  9:54 AM  Result Value Ref Range Status   SARS-CoV-2, NAA NOT DETECTED NOT DETECTED Final    Comment: Negative (Not Detected)  results do not exclude infection caused by SARS CoV 2 and should not be used as the sole basis for treatment or other patient management decisions. Optimum specimen types and timing for peak viral levels during infections caused  by SARS CoV 2 have not been determined. Collection of multiple specimens (types and time points) from the same patient may be necessary to detect the virus. Improper specimen collection and handling, sequence variability underlying assay primers and or probes, or the presence of organisms in  quantities less than the limit of detection of the assay may lead to false negative results. Positive and negative predictive values of testing are highly dependent on prevalence. False negative results are more likely when prevalence of disease is  high. (NOTE) The expected result is Negative (Not Detected). The SARS CoV 2 test is intended for the presumptive qualitative  detection of nucleic acid from SARS CoV 2 in upper and lower  respir atory specimens. Testing methodology is real time RT PCR. Test results must be correlated with clinical presentation and  evaluated in the context of other laboratory and epidemiologic data.  Test performance can be affected because the epidemiology and  clinical spectrum of infection caused by SARS CoV 2 is not fully  known. For example, the optimum types of specimens to collect and  when during the course of infection these specimens are most likely  to contain detectable viral RNA may not be known. This test has not been Food and Drug Administration (FDA) cleared or  approved and has been authorized by FDA under an Emergency Use  Authorization (EUA). The test is only authorized for the duration of  the declaration that circumstances exist justifying the authorization  of emergency use of in vitro diagnostic tests for detection and or  diagnosis of SARS CoV 2 under Section 564(b)(1) of the Act, 21 U.S.C.  section [phone] 3(b)(1), unless the  authorization is terminated or   revoked sooner. [facility] is certified under the  Clinical Laboratory Improvement Amendments of 1988 (CLIA), 42 U.S.C.  section 7082024472, to perform high complexity tests. Performed at [facility] [id number], [place], [place] [address] Laboratory Director: Loleta Books, MD Fact Sheet for Healthcare Providers  BankingDealers.co.za Fact Sheet for Patients  StrictlyIdeas.no    Coronavirus Source NASOPHARYNGEAL  Final    Comment: Performed at [facility], [address] [place], [place] [address]  MRSA PCR Screening     Status: None   Collection Time: 04/21/18  4:49 PM  Result Value Ref Range Status   MRSA by PCR NEGATIVE NEGATIVE Final    Comment:        The GeneXpert MRSA Assay (FDA approved for NASAL specimens only), is one component of a comprehensive MRSA colonization surveillance program. It is not intended to diagnose MRSA infection nor to guide or monitor treatment for MRSA infections. Performed at [facility], [place] [address] [place], [place] [address]     RADIOLOGY STUDIES/RESULTS: Dg Abd 1 View  Result Date: 04/21/2018 CLINICAL DATA:  Confirm OG tube placement EXAM: ABDOMEN - 1 VIEW COMPARISON:  None. FINDINGS: Orogastric tube with the tip projecting over the stomach. There is no bowel dilatation to suggest obstruction. There is no evidence of pneumoperitoneum, portal venous gas or pneumatosis. There are no pathologic calcifications along the expected course of the ureters. The osseous structures are unremarkable. IMPRESSION: Orogastric tube with the tip projecting over the stomach. Electronically Signed   By: Kathreen Devoid   On: 04/21/2018 18:00   Dg Abd 1 View  Result Date: 04/02/2018 CLINICAL DATA:  Abdominal pain. EXAM: ABDOMEN - 1 VIEW COMPARISON:  CT of the abdomen and pelvis on 03/29/2018 and  abdominal film on 03/02/2018 FINDINGS: No evidence of bowel obstruction, ileus or signs of free air. Status post cholecystectomy. Endoscopic hemostatic clips are present in the left upper quadrant in the region of the stomach. No abnormal calcifications. Visualized bony structures are unremarkable. IMPRESSION: No acute findings. Electronically Signed   By: Aletta Edouard M.D.   On: 04/02/2018 17:39   Dg Chest Port 1 View  Result Date: 04/22/2018 CLINICAL DATA:  Endotracheal tube placement EXAM: PORTABLE CHEST  1 VIEW COMPARISON:  04/21/2018 FINDINGS: Endotracheal tube tip between the clavicular heads and carina. The orogastric tube at least reaches the stomach. Diffuse interstitial opacity that is improved. Probable trace layering pleural effusion. Borderline heart size, stable. IMPRESSION: 1. Stable hardware positioning. 2.  Improved edema. Electronically Signed   By: Monte Fantasia M.D.   On: 04/22/2018 06:50   Dg Chest Portable 1 View  Result Date: 04/21/2018 CLINICAL DATA:  Shortness of breath.  Respiratory distress. EXAM: PORTABLE CHEST 1 VIEW COMPARISON:  04/02/2018 FINDINGS: Endotracheal tube tip is just above the carina. Retraction by 5.5 cm would place it at the level of the clavicular heads. Orogastric tube tip and side port are beyond the field of view. Interstitial opacities in the right lung have worsened since the prior study. Left lung is clear. IMPRESSION: 1. Endotracheal tube tip just above the carina. Recommend retraction by 3-5 cm. 2. Markedly worsened interstitial opacities within the right lung which may indicate infection or asymmetric pulmonary edema. Electronically Signed   By: Ulyses Jarred M.D.   On: 04/21/2018 06:45   Dg Chest Port 1 View  Result Date: 04/02/2018 CLINICAL DATA:  Shortness of breath EXAM: PORTABLE CHEST 1 VIEW COMPARISON:  Four days ago FINDINGS: Cardiomegaly. Negative aortic contours. Interstitial coarsening similar to prior, with cephalized blood flow. Probable  small right pleural effusion creating asymmetric density at the right base. No pneumothorax. IMPRESSION: Cardiomegaly with pulmonary edema and small right pleural effusion. Electronically Signed   By: Monte Fantasia M.D.   On: 04/02/2018 09:24   Dg Chest Portable 1 View  Result Date: 03/29/2018 CLINICAL DATA:  Cough, shortness of breath. EXAM: PORTABLE CHEST 1 VIEW COMPARISON:  Radiograph of March 02, 2018. FINDINGS: The heart size and mediastinal contours are within normal limits. Both lungs are clear. No pneumothorax or pleural effusion is noted. The visualized skeletal structures are unremarkable. IMPRESSION: No active disease. Electronically Signed   By: Marijo Conception, M.D.   On: 03/29/2018 12:28   Ct Renal Stone Study  Result Date: 03/29/2018 CLINICAL DATA:  Right flank pain and dysuria since yesterday. History of renal calculi. 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 EXAM: CT ABDOMEN AND PELVIS WITHOUT CONTRAST TECHNIQUE: Multidetector CT imaging of the abdomen and pelvis was performed following the standard protocol without IV contrast. COMPARISON:  Abdomen and pelvis radiograph dated 03/02/2018 and abdomen and pelvis CT dated 02/08/2009. FINDINGS: Lower chest: Small bilateral pleural effusions, larger on the right. Minimal bilateral dependent atelectasis. Minimal pericardial effusion with a maximum thickness of 3 mm. Hepatobiliary: No focal liver abnormality is seen. Status post cholecystectomy. No biliary dilatation. Pancreas: Unremarkable. No pancreatic ductal dilatation or surrounding inflammatory changes. Spleen: Normal in size without focal abnormality. Adrenals/Urinary Tract: Normal appearing adrenal glands. 2.5 cm left renal cyst. Somewhat small right kidney. Unremarkable ureters and urinary bladder. No urinary tract calculi or hydronephrosis. Stomach/Bowel: 2 metallic clips in the  stomach. Similar-appearing metallic clip in the rectum posteriorly on the right, within a small amount of stool. Unremarkable small bowel and appendix. Vascular/Lymphatic: Atheromatous arterial calcifications without aneurysm. No enlarged lymph nodes. Reproductive: Status post hysterectomy. No adnexal masses. Other: Midline surgical scar. Musculoskeletal: Minimal lumbar and lower thoracic spine degenerative changes. IMPRESSION: 1. No urinary tract calculi or hydronephrosis. 2. Small bilateral pleural effusions, larger on the right. 3. Minimal pericardial effusion. 4. Mild to moderate right renal atrophy. 5. Two the previously placed  gastric clips are in the stomach and one is in the rectum within a small amount of stool. Electronically Signed   By: Claudie Revering M.D.   On: 03/29/2018 14:20     LOS: 4 days   Oren Binet, MD  [facility]  If 7PM-7AM, please contact night-coverage  Please page via [email or web address]  Go to [email or web address] and use [facility]'s universal password to access. If you do not have the password, please contact the hospital operator.  Locate the [facility] provider you are looking for under [facility] and page to a number that you can be directly reached. If you still have difficulty reaching the provider, please page the [facility] (Director on Call) for the Hospitalists listed on amion for assistance.  04/25/2018, 8:36 AM

## 2018-04-26 DIAGNOSIS — J90 Pleural effusion, not elsewhere classified: Secondary | ICD-10-CM | POA: Diagnosis not present

## 2018-04-26 DIAGNOSIS — Z7401 Bed confinement status: Secondary | ICD-10-CM | POA: Diagnosis not present

## 2018-04-26 DIAGNOSIS — M2550 Pain in unspecified joint: Secondary | ICD-10-CM | POA: Diagnosis not present

## 2018-04-26 DIAGNOSIS — G4089 Other seizures: Secondary | ICD-10-CM | POA: Diagnosis not present

## 2018-04-26 DIAGNOSIS — I2510 Atherosclerotic heart disease of native coronary artery without angina pectoris: Secondary | ICD-10-CM | POA: Diagnosis not present

## 2018-04-26 DIAGNOSIS — R5383 Other fatigue: Secondary | ICD-10-CM | POA: Diagnosis not present

## 2018-04-26 DIAGNOSIS — I5033 Acute on chronic diastolic (congestive) heart failure: Secondary | ICD-10-CM | POA: Diagnosis not present

## 2018-04-26 DIAGNOSIS — M79671 Pain in right foot: Secondary | ICD-10-CM | POA: Diagnosis not present

## 2018-04-26 DIAGNOSIS — D631 Anemia in chronic kidney disease: Secondary | ICD-10-CM | POA: Diagnosis not present

## 2018-04-26 DIAGNOSIS — F323 Major depressive disorder, single episode, severe with psychotic features: Secondary | ICD-10-CM | POA: Diagnosis not present

## 2018-04-26 DIAGNOSIS — I10 Essential (primary) hypertension: Secondary | ICD-10-CM | POA: Diagnosis not present

## 2018-04-26 DIAGNOSIS — N179 Acute kidney failure, unspecified: Secondary | ICD-10-CM | POA: Diagnosis not present

## 2018-04-26 DIAGNOSIS — I5030 Unspecified diastolic (congestive) heart failure: Secondary | ICD-10-CM | POA: Diagnosis not present

## 2018-04-26 DIAGNOSIS — E43 Unspecified severe protein-calorie malnutrition: Secondary | ICD-10-CM | POA: Diagnosis not present

## 2018-04-26 DIAGNOSIS — N390 Urinary tract infection, site not specified: Secondary | ICD-10-CM | POA: Diagnosis not present

## 2018-04-26 DIAGNOSIS — R627 Adult failure to thrive: Secondary | ICD-10-CM | POA: Diagnosis not present

## 2018-04-26 DIAGNOSIS — R600 Localized edema: Secondary | ICD-10-CM | POA: Diagnosis not present

## 2018-04-26 DIAGNOSIS — Z79899 Other long term (current) drug therapy: Secondary | ICD-10-CM | POA: Diagnosis not present

## 2018-04-26 DIAGNOSIS — D72829 Elevated white blood cell count, unspecified: Secondary | ICD-10-CM | POA: Diagnosis not present

## 2018-04-26 DIAGNOSIS — M79672 Pain in left foot: Secondary | ICD-10-CM | POA: Diagnosis not present

## 2018-04-26 DIAGNOSIS — I5031 Acute diastolic (congestive) heart failure: Secondary | ICD-10-CM | POA: Diagnosis not present

## 2018-04-26 DIAGNOSIS — F419 Anxiety disorder, unspecified: Secondary | ICD-10-CM | POA: Diagnosis not present

## 2018-04-26 DIAGNOSIS — J810 Acute pulmonary edema: Secondary | ICD-10-CM | POA: Diagnosis not present

## 2018-04-26 DIAGNOSIS — J9601 Acute respiratory failure with hypoxia: Secondary | ICD-10-CM | POA: Diagnosis not present

## 2018-04-26 DIAGNOSIS — M069 Rheumatoid arthritis, unspecified: Secondary | ICD-10-CM | POA: Diagnosis not present

## 2018-04-26 DIAGNOSIS — M6281 Muscle weakness (generalized): Secondary | ICD-10-CM | POA: Diagnosis not present

## 2018-04-26 DIAGNOSIS — R5381 Other malaise: Secondary | ICD-10-CM | POA: Diagnosis not present

## 2018-04-26 DIAGNOSIS — M10079 Idiopathic gout, unspecified ankle and foot: Secondary | ICD-10-CM | POA: Diagnosis not present

## 2018-04-26 DIAGNOSIS — N183 Chronic kidney disease, stage 3 (moderate): Secondary | ICD-10-CM | POA: Diagnosis not present

## 2018-04-26 DIAGNOSIS — E860 Dehydration: Secondary | ICD-10-CM | POA: Diagnosis not present

## 2018-04-26 DIAGNOSIS — R569 Unspecified convulsions: Secondary | ICD-10-CM | POA: Diagnosis not present

## 2018-04-26 LAB — BASIC METABOLIC PANEL
Anion gap: 10 (ref 5–15)
BUN: 40 mg/dL — ABNORMAL HIGH (ref 8–23)
CO2: 23 mmol/L (ref 22–32)
Calcium: 8.6 mg/dL — ABNORMAL LOW (ref 8.9–10.3)
Chloride: 109 mmol/L (ref 98–111)
Creatinine, Ser: 1.54 mg/dL — ABNORMAL HIGH (ref 0.44–1.00)
GFR calc Af Amer: 37 mL/min — ABNORMAL LOW (ref >60)
GFR calc non Af Amer: 32 mL/min — ABNORMAL LOW (ref >60)
Glucose, Bld: 129 mg/dL — ABNORMAL HIGH (ref 70–99)
Potassium: 4.8 mmol/L (ref 3.5–5.1)
Sodium: 142 mmol/L (ref 135–145)

## 2018-04-26 LAB — CULTURE, BLOOD (ROUTINE X 2)
Culture: NO GROWTH
Culture: NO GROWTH
Special Requests: ADEQUATE

## 2018-04-26 MED ORDER — CARVEDILOL 12.5 MG PO TABS: 25.0000 mg | ORAL_TABLET | Freq: Two times a day (BID) | ORAL | 6 refills | Status: AC

## 2018-04-26 MED ORDER — FUROSEMIDE 40 MG PO TABS: 40.0000 mg | ORAL_TABLET | Freq: Every day | ORAL | Status: DC

## 2018-04-26 NOTE — Progress Notes (Signed)
Occupational Therapy Treatment Patient Details Name: SHANE MELBY MRN: [id number] DOB: Nov 08, 1940 Today's Date: 04/26/2018    History of present illness Pt is a 78 y/o female admitted from SNF secondary to respiratory failure. Pt required intubation from 4/5-4/7. PMH including but not limited to CAD s/p PCI February 2020, seizure disorder, CKD stage III, rheumatoid arthritis.   OT comments  Pt. Seen for skilled co tx with PT for continued work towards increasing safety and independence with functional mobility and ADLs.    Follow Up Recommendations  SNF;Supervision/Assistance - 24 hour    Equipment Recommendations       Recommendations for Other Services      Precautions / Restrictions Precautions Precautions: Fall Restrictions Weight Bearing Restrictions: No       Mobility Bed Mobility Overal bed mobility: Needs Assistance Bed Mobility: Supine to Sit;Sit to Supine     Supine to sit: Mod assist Sit to supine: Mod assist;+2 for physical assistance   General bed mobility comments: increased time and effort, assistance needed for trunk management and for bilateral LE return to bed; cueing for sequencing   Transfers Overall transfer level: Needs assistance Equipment used: Rolling walker (2 wheeled) Transfers: Sit to/from Stand Sit to Stand: Min assist;Mod assist;+2 physical assistance;From elevated surface         General transfer comment: cueing for safe hand placement, bed in elevated position, assistance powering into standing and multimodal cueing for improved upright posture    Balance Overall balance assessment: Needs assistance Sitting-balance support: Feet supported;Single extremity supported;Bilateral upper extremity supported Sitting balance-Leahy Scale: Poor   Postural control: Posterior lean Standing balance support: Bilateral upper extremity supported Standing balance-Leahy Scale: Poor                             ADL either performed  or assessed with clinical judgement   ADL Overall ADL's : Needs assistance/impaired             Lower Body Bathing: Bed level;Set up Lower Body Bathing Details (indicate cue type and reason): able to perform pericare in supine            Toilet Transfer Details (indicate cue type and reason): bladder incontinence-has purewik         Functional mobility during ADLs: Moderate assistance;Rolling walker General ADL Comments: Mod A for transfers and increased assist for ADL     Vision       Perception     Praxis      Cognition Arousal/Alertness: Lethargic Behavior During Therapy: Flat affect Overall Cognitive Status: Impaired/Different from baseline Area of Impairment: Safety/judgement;Problem solving                         Safety/Judgement: Decreased awareness of deficits   Problem Solving: Slow processing;Decreased initiation;Difficulty sequencing;Requires verbal cues General Comments: slow processing. unsure what baselinse is.         Exercises     Shoulder Instructions       General Comments      Pertinent Vitals/ Pain       Pain Assessment: No/denies pain  Home Living                                          Prior Functioning/Environment  Frequency  Min 3X/week        Progress Toward Goals  OT Goals(current goals can now be found in the care plan section)  Progress towards OT goals: Progressing toward goals     Plan Discharge plan remains appropriate    Co-evaluation    PT/OT/SLP Co-Evaluation/Treatment: Yes Reason for Co-Treatment: For patient/therapist safety;To address functional/ADL transfers PT goals addressed during session: Mobility/safety with mobility;Balance;Strengthening/ROM OT goals addressed during session: ADL's and self-care      AM-PAC OT "6 Clicks" Daily Activity     Outcome Measure   Help from another person eating meals?: None Help from another person taking care  of personal grooming?: A Little Help from another person toileting, which includes using toliet, bedpan, or urinal?: A Little Help from another person bathing (including washing, rinsing, drying)?: A Lot Help from another person to put on and taking off regular upper body clothing?: A Little Help from another person to put on and taking off regular lower body clothing?: A Lot 6 Click Score: 17    End of Session Equipment Utilized During Treatment: Rolling walker;Gait belt  OT Visit Diagnosis: Unsteadiness on feet (R26.81);Other abnormalities of gait and mobility (R26.89);Muscle weakness (generalized) (M62.81);Adult, failure to thrive (R62.7)   Activity Tolerance Patient tolerated treatment well   Patient Left in bed;with call bell/phone within reach;with bed alarm set;Other (comment)(rn requests back to bed no chair due to pending d/c soon)   Nurse Communication Other (comment)(notified RN new purewick needed)        Time: 7972-8206 OT Time Calculation (min): 23 min  Charges: OT General Charges $OT Visit: 1 Visit OT Treatments $Self Care/Home Management : 8-22 mins  Janice Coffin, COTA/L 04/26/2018, 1:32 PM

## 2018-04-26 NOTE — Progress Notes (Signed)
Physical Therapy Treatment Patient Details Name: [name] MRN: [id number] DOB: 1940-01-19 Today's Date: 04/26/2018    History of Present Illness Pt is a 78 y/o female admitted from SNF secondary to respiratory failure. Pt required intubation from 4/5-4/7. PMH including but not limited to CAD s/p PCI February 2020, seizure disorder, CKD stage III, rheumatoid arthritis.    PT Comments    Pt remains very limited secondary to fatigue and weakness. Pt would continue to benefit from skilled physical therapy services at this time while admitted and after d/c to address the below listed limitations in order to improve overall safety and independence with functional mobility.    Follow Up Recommendations  SNF;Supervision for mobility/OOB     Equipment Recommendations  None recommended by PT    Recommendations for Other Services       Precautions / Restrictions Precautions Precautions: Fall Restrictions Weight Bearing Restrictions: No    Mobility  Bed Mobility Overal bed mobility: Needs Assistance Bed Mobility: Supine to Sit;Sit to Supine     Supine to sit: Mod assist Sit to supine: Mod assist;+2 for physical assistance   General bed mobility comments: increased time and effort, assistance needed for trunk management and for bilateral LE return to bed; cueing for sequencing   Transfers Overall transfer level: Needs assistance Equipment used: Rolling walker (2 wheeled) Transfers: Sit to/from Stand Sit to Stand: Min assist;Mod assist;+2 physical assistance;From elevated surface         General transfer comment: cueing for safe hand placement, bed in elevated position, assistance powering into standing and multimodal cueing for improved upright posture  Ambulation/Gait             General Gait Details: pt able to take very small, slow side steps at EOB, cueing for improved step size   Stairs             Wheelchair Mobility    Modified Rankin (Stroke  Patients Only)       Balance Overall balance assessment: Needs assistance Sitting-balance support: Feet supported;Single extremity supported;Bilateral upper extremity supported Sitting balance-Leahy Scale: Poor   Postural control: Posterior lean Standing balance support: Bilateral upper extremity supported Standing balance-Leahy Scale: Poor                              Cognition Arousal/Alertness: Awake/alert Behavior During Therapy: Flat affect Overall Cognitive Status: Impaired/Different from baseline Area of Impairment: Safety/judgement;Problem solving                         Safety/Judgement: Decreased awareness of deficits   Problem Solving: Slow processing;Decreased initiation;Difficulty sequencing;Requires verbal cues        Exercises      General Comments        Pertinent Vitals/Pain Pain Assessment: No/denies pain    Home Living                      Prior Function            PT Goals (current goals can now be found in the care plan section) Acute Rehab PT Goals PT Goal Formulation: With patient Time For Goal Achievement: 05/09/18 Potential to Achieve Goals: Fair Progress towards PT goals: Progressing toward goals    Frequency    Min 2X/week      PT Plan Current plan remains appropriate    Co-evaluation PT/OT/SLP Co-Evaluation/Treatment: Yes Reason for Co-Treatment:  To address functional/ADL transfers;For patient/therapist safety          AM-PAC PT "6 Clicks" Mobility   Outcome Measure  Help needed turning from your back to your side while in a flat bed without using bedrails?: A Lot Help needed moving from lying on your back to sitting on the side of a flat bed without using bedrails?: A Lot Help needed moving to and from a bed to a chair (including a wheelchair)?: A Lot Help needed standing up from a chair using your arms (e.g., wheelchair or bedside chair)?: A Lot Help needed to walk in hospital  room?: Total Help needed climbing 3-5 steps with a railing? : Total 6 Click Score: 10    End of Session Equipment Utilized During Treatment: Gait belt Activity Tolerance: Patient limited by fatigue Patient left: in bed;with call bell/phone within reach;with bed alarm set Nurse Communication: Mobility status PT Visit Diagnosis: Other abnormalities of gait and mobility (R26.89);Muscle weakness (generalized) (M62.81)     Time: 3794-3276 PT Time Calculation (min) (ACUTE ONLY): 23 min  Charges:  $Therapeutic Activity: 8-22 mins                     Sherie Don, [place], DPT  Acute Rehabilitation Services Pager [phone] Office [place] 04/26/2018, 12:51 PM

## 2018-04-26 NOTE — Progress Notes (Signed)
Pt seen by MD, orders written for discharge. RN went over discharge instructions with pt &  Careers adviser from [facility]. RN answered all questions. RN removed IV's. Pt transferred to [facility] by [facility].EMTs notified of pt's dentures in her patient belongings bag. Pt will follow up outpatient with MD.

## 2018-04-26 NOTE — TOC Progression Note (Addendum)
Transition of Care [facility]) - Progression Note    Patient Details  Name: ETTY ISAAC MRN: [id number] Date of Birth: 1941-01-04  Transition of Care [facility]) CM/SW [place], LCSW Phone Number: 04/26/2018, 11:07 AM  Clinical Narrative:    Insurance approval received for patient to discharge to [facility]. Spouse updated and aware that he will need to touch base with insurance to see when patient will be in copay days at [facility]. Patient will require [facility] for transport.    Expected Discharge Plan: [place] Barriers to Discharge: No Barriers Identified  Expected Discharge Plan and Services Expected Discharge Plan: [place] In-house Referral: Clinical Social Work Discharge Planning Services: NA Post Acute Care Choice: [place] Living arrangements for the past 2 months: [place] Expected Discharge Date: 04/26/18               DME Arranged: N/A DME Agency: NA HH Arranged: NA HH Agency: NA   Social Determinants of Health (SDOH) Interventions    Readmission Risk Interventions Readmission Risk Prevention Plan 04/25/2018 03/30/2018  Transportation Screening Complete Complete  PCP or Specialist Appt within 5-7 Days - Not Complete  Not Complete comments - [place] Screening - Not Complete  Medication Review (RN CM) - Not Complete  Medication Review (RN Care Manager) Complete -  PCP or Specialist appointment within 3-5 days of discharge Complete -  [place] or Home Care Consult Complete -  SW Recovery Care/Counseling Consult Complete -  Palliative Care Screening Not Applicable -  Skilled Nursing Facility Complete -  Some recent data might be hidden

## 2018-04-26 NOTE — TOC Transition Note (Signed)
Transition of Care [facility]) - CM/SW Discharge Note   Patient Details  Name: [name] MRN: [id number] Date of Birth: 1940/04/27  Transition of Care [facility]) CM/SW Contact:  Benard Halsted, LCSW Phone Number: 04/26/2018, 11:27 AM   Clinical Narrative:    Patient will DC to: [place] Anticipated DC date: 04/26/2018 Family notified: Spouse, John Transport by: V[facility] Miners   Per MD patient ready for DC to [facility]. RN, patient, patient's family, and facility notified of DC. Discharge Summary and FL2 sent to facility. RN to call report prior to discharge [phone] Room 122A). DC packet on chart. Ambulance transport requested for patient.   CSW will sign off for now as social work intervention is no longer needed. Please consult [place] again if new needs arise.  Cedric Fishman, LCSW Clinical Social Worker [phone]      Barriers to Discharge: No Barriers Identified   Patient Goals and CMS Choice Patient states their goals for this hospitalization and ongoing recovery are:: Rehab CMS Medicare.gov Compare Post Acute Care list provided to:: Other (Comment Required)(Spouse and granddaughter) Choice offered to / list presented to : Spouse  Discharge Placement   Existing PASRR number confirmed : 04/26/18          Patient chooses bed at: [facility] Patient to be transferred to facility by: [place] Name of family member notified: [name], spouse Patient and family notified of of transfer: 04/26/18  Discharge Plan and Services In-house Referral: Clinical Social Work Discharge Planning Services: NA Post Acute Care Choice: [place]          DME Arranged: N/A DME Agency: NA HH Arranged: NA [place] Agency: NA   Social Determinants of Health ([place]) Interventions     Readmission Risk Interventions Readmission Risk Prevention Plan 04/25/2018 03/30/2018  Transportation Screening Complete Complete  PCP or Specialist Appt within 5-7 Days - Not  Complete  Not Complete comments - [place] Screening - Not Complete  Medication Review (RN CM) - Not Complete  Medication Review (RN Care Manager) Complete -  PCP or Specialist appointment within 3-5 days of discharge Complete -  [place] or Home Care Consult Complete -  SW Recovery Care/Counseling Consult Complete -  Palliative Care Screening Not Applicable -  Skilled Nursing Facility Complete -  Some recent data might be hidden

## 2018-04-26 NOTE — Discharge Summary (Signed)
PATIENT DETAILS Name: [name] Age: 78 y.o. Sex: female Date of Birth: 1940/10/11 MRN: [id number]. Admitting Physician: Shellia Cleverly, MD [id number], Bailey Mech, MD  Admit Date: 04/21/2018 Discharge date: 04/26/2018  Recommendations for Outpatient Follow-up:  1. Follow up with PCP in 1-2 weeks 2. Please obtain BMP/CBC in one week  Admitted From:  SNF  Disposition: SNF   Home Health: No  Equipment/Devices: None  Discharge Condition: Stable  CODE STATUS: FULL CODE  Diet recommendation:  Heart Healthy   Brief Summary: See H&P, Labs, Consult and Test reports for all details in brief, Patient is a 78 y.o. female with history of CAD s/p PCI February 2020, seizure disorder, CKD stage III, rheumatoid arthritis presented with acute hypoxic respiratory failure requiring intubation/mechanical ventilation secondary to pulmonary edema.  COVID-19 negative.  Managed in the ICU-extubated on 4/7-upon further stability-transfer to the triad hospitalist service on 4/9.  See below for further details.  Brief Hospital Course: Acute hypoxemic respiratory failure: Secondary to pulmonary edema/decompensated diastolic heart failure-responded well to IV diuretics, extubated on 4/7. Titrated off to room air.COVID-19 negative.  Decompensated diastolic heart failure:compensated-received IV Lasix while in the ICU, -6.8 L so far-continue oral Lasix.  Acute metabolic encephalopathy: Suspect secondary to respiratory failure/AKI-awake and alert at baseline.  Per prior PCCM notes-suspect some component of underlying dementia/cognitive dysfunction.  AKI on CKD stage III: AKI likely hemodynamically mediated-creatinine improved and now close to usual baseline.  We will monitor electrolytes periodically.  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  No anginal symptoms-troponins  negative.  Hypertension: Blood pressure relatively well controlled-continue Coreg 25 mg twice daily and Lasix.  Further optimization can be done in the outpatient setting.  Anemia: Secondary to CKD at baseline-probably worsened by acute illness-stable for now-continue intermittent monitoring.  Rheumatoid arthritis: Appears stable-continue Plaquenil.  Seizure disorder: Stable-continue Keppra  History of GI bleeding in February 2020 (post PCI) secondary to AVMs: Continue Protonix  Failure to thrive syndrome/deconditioning: Appears very frail at baseline-probably has worsening deconditioning due to acute/critical illness. Evaluated by PT/OT-recommendations are for SNF on discharge.  Note-spoke with spouse over the phone-he is aware of the potential for discharge later today.  Procedures/Studies: None  Discharge Diagnoses:  Active Problems:   Respiratory failure (HCC)   Acute hypoxemic respiratory failure (HCC)   Suspected Covid-19 Virus Infection   Discharge Instructions:  Activity:  As tolerated with Full fall precautions use walker/cane & assistance as needed  Discharge Instructions    (HEART FAILURE PATIENTS) Call MD:  Anytime you have any of the following symptoms: 1) 3 pound weight gain in 24 hours or 5 pounds in 1 week 2) shortness of breath, with or without a dry hacking cough 3) swelling in the hands, feet or stomach 4) if you have to sleep on extra pillows at night in order to breathe.   Complete by:  As directed    Diet - low sodium heart healthy   Complete by:  As directed    Discharge instructions   Complete by:  As directed    Follow with Primary MD  Glendale Chard, MD in 1 week  Please get a complete blood count and chemistry panel checked by your Primary MD at your next visit, and again as instructed by your Primary MD.  Get Medicines reviewed and adjusted: Please take all your medications with you for your next visit with your Primary  MD  Laboratory/radiological data: Please request your Primary MD to go over all hospital tests and procedure/radiological results at the follow up, please ask your Primary MD to get all Hospital records sent to his/her office.  In some cases, they will be blood work, cultures and biopsy results pending at the time of your discharge. Please request that your primary care M.D. follows up on these results.  Also Note the following: If you experience worsening of your admission symptoms, develop shortness of breath, life threatening emergency, suicidal or homicidal thoughts you must seek medical attention immediately by calling 911 or calling your MD immediately  if symptoms less severe.  You must read complete instructions/literature along with all the possible adverse reactions/side effects for all the Medicines you take and that have been prescribed to you. Take any new Medicines after you have completely understood and accpet all the possible adverse reactions/side effects.   Do not drive when taking Pain medications or sleeping medications (Benzodaizepines)  Do not take more than prescribed Pain, Sleep and Anxiety Medications. It is not advisable to combine anxiety,sleep and pain medications without talking with your primary care practitioner  Special Instructions: If you have smoked or chewed Tobacco  in the last 2 yrs please stop smoking, stop any regular Alcohol  and or any Recreational drug use.  Wear Seat belts while driving.  Please note: You were cared for by a hospitalist during your hospital stay. Once you are discharged, your primary care physician will handle any further medical issues. Please note that NO REFILLS for any discharge medications will be authorized once you are discharged, as it is imperative that you return to your primary care physician (or establish a relationship with a primary care physician if you do not have one) for your post hospital discharge needs so that  they can reassess your need for medications and monitor your lab values.   Increase activity slowly   Complete by:  As directed      Allergies as of 04/26/2018   No Known Allergies     Medication List    STOP taking these medications   Vitamin D 125 MCG (5000 UT) Caps     TAKE these medications   acetaminophen 325 MG tablet Commonly known as:  TYLENOL Take 2 tablets (650 mg total) by mouth every 6 (six) hours as needed for mild pain (or Fever >/= 101).   atorvastatin 80 MG tablet Commonly known as:  LIPITOR Take 1 tablet (80 mg total) by mouth daily at 6 PM.   carvedilol 12.5 MG tablet Commonly known as:  COREG Take 2 tablets (25 mg total) by mouth 2 (two) times daily with a meal. What changed:  how much to take   furosemide 40 MG tablet Commonly known as:  LASIX Take 1 tablet (40 mg total) by mouth daily.   hydroxychloroquine 200 MG tablet Commonly known as:  PLAQUENIL Take 200 mg by mouth daily.   iron polysaccharides 150 MG capsule Commonly known as:  NIFEREX Take 1 capsule (150 mg total) by mouth daily.   lactose free nutrition Liqd Take 237 mLs by mouth 3 (three) times daily between meals.   levETIRAcetam 500 MG tablet Commonly known as:  KEPPRA TAKE 2 TABLETS BY MOUTH EVERY DAY   mirtazapine 15 MG tablet Commonly known as:  REMERON Take 15 mg by mouth at bedtime.   nitroGLYCERIN 0.4 MG SL tablet Commonly known as:  Nitrostat Place 1 tablet (0.4 mg total) under the tongue  every 5 (five) minutes as needed. What changed:  reasons to take this   ondansetron 4 MG tablet Commonly known as:  ZOFRAN Take 1 tablet (4 mg total) by mouth every 6 (six) hours as needed for nausea.   pantoprazole 40 MG tablet Commonly known as:  PROTONIX Take 1 tablet (40 mg total) by mouth daily.   predniSONE 5 MG tablet Commonly known as:  DELTASONE Take 1 tablet (5 mg total) by mouth daily with breakfast.   ticagrelor 90 MG Tabs tablet Commonly known as:  BRILINTA Take  1 tablet (90 mg total) by mouth 2 (two) times daily.   UNABLE TO FIND Take 120 mLs by mouth 3 (three) times daily. Med Name: Med Plus 2.0   Vitamin D (Ergocalciferol) 1.25 MG (50000 UT) Caps capsule Commonly known as:  DRISDOL Take 1 capsule (50,000 Units total) by mouth every 7 (seven) days.      Contact information for after-discharge care    Destination    [facility] Preferred SNF .   Service:  Skilled Nursing Contact information: [address] [place] [phone]             No Known Allergies   Consultations:   pulmonary/intensive care   Other Procedures/Studies: Dg Abd 1 View  Result Date: 04/21/2018 CLINICAL DATA:  Confirm OG tube placement EXAM: ABDOMEN - 1 VIEW COMPARISON:  None. FINDINGS: Orogastric tube with the tip projecting over the stomach. There is no bowel dilatation to suggest obstruction. There is no evidence of pneumoperitoneum, portal venous gas or pneumatosis. There are no pathologic calcifications along the expected course of the ureters. The osseous structures are unremarkable. IMPRESSION: Orogastric tube with the tip projecting over the stomach. Electronically Signed   By: Kathreen Devoid   On: 04/21/2018 18:00   Dg Abd 1 View  Result Date: 04/02/2018 CLINICAL DATA:  Abdominal pain. EXAM: ABDOMEN - 1 VIEW COMPARISON:  CT of the abdomen and pelvis on 03/29/2018 and abdominal film on 03/02/2018 FINDINGS: No evidence of bowel obstruction, ileus or signs of free air. Status post cholecystectomy. Endoscopic hemostatic clips are present in the left upper quadrant in the region of the stomach. No abnormal calcifications. Visualized bony structures are unremarkable. IMPRESSION: No acute findings. Electronically Signed   By: Aletta Edouard M.D.   On: 04/02/2018 17:39   Dg Chest Port 1 View  Result Date: 04/22/2018 CLINICAL DATA:  Endotracheal tube placement EXAM: PORTABLE CHEST 1 VIEW COMPARISON:  04/21/2018 FINDINGS:  Endotracheal tube tip between the clavicular heads and carina. The orogastric tube at least reaches the stomach. Diffuse interstitial opacity that is improved. Probable trace layering pleural effusion. Borderline heart size, stable. IMPRESSION: 1. Stable hardware positioning. 2.  Improved edema. Electronically Signed   By: Monte Fantasia M.D.   On: 04/22/2018 06:50   Dg Chest Portable 1 View  Result Date: 04/21/2018 CLINICAL DATA:  Shortness of breath.  Respiratory distress. EXAM: PORTABLE CHEST 1 VIEW COMPARISON:  04/02/2018 FINDINGS: Endotracheal tube tip is just above the carina. Retraction by 5.5 cm would place it at the level of the clavicular heads. Orogastric tube tip and side port are beyond the field of view. Interstitial opacities in the right lung have worsened since the prior study. Left lung is clear. IMPRESSION: 1. Endotracheal tube tip just above the carina. Recommend retraction by 3-5 cm. 2. Markedly worsened interstitial opacities within the right lung which may indicate infection or asymmetric pulmonary edema. Electronically Signed   By:  Ulyses Jarred M.D.   On: 04/21/2018 06:45   Dg Chest Port 1 View  Result Date: 04/02/2018 CLINICAL DATA:  Shortness of breath EXAM: PORTABLE CHEST 1 VIEW COMPARISON:  Four days ago FINDINGS: Cardiomegaly. Negative aortic contours. Interstitial coarsening similar to prior, with cephalized blood flow. Probable small right pleural effusion creating asymmetric density at the right base. No pneumothorax. IMPRESSION: Cardiomegaly with pulmonary edema and small right pleural effusion. Electronically Signed   By: Monte Fantasia M.D.   On: 04/02/2018 09:24   Dg Chest Portable 1 View  Result Date: 03/29/2018 CLINICAL DATA:  Cough, shortness of breath. EXAM: PORTABLE CHEST 1 VIEW COMPARISON:  Radiograph of March 02, 2018. FINDINGS: The heart size and mediastinal contours are within normal limits. Both lungs are clear. No pneumothorax or pleural effusion is  noted. The visualized skeletal structures are unremarkable. IMPRESSION: No active disease. Electronically Signed   By: Marijo Conception, M.D.   On: 03/29/2018 12:28   Ct Renal Stone Study  Result Date: 03/29/2018 CLINICAL DATA:  Right flank pain and dysuria since yesterday. History of renal calculi. 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 EXAM: CT ABDOMEN AND PELVIS WITHOUT CONTRAST TECHNIQUE: Multidetector CT imaging of the abdomen and pelvis was performed following the standard protocol without IV contrast. COMPARISON:  Abdomen and pelvis radiograph dated 03/02/2018 and abdomen and pelvis CT dated 02/08/2009. FINDINGS: Lower chest: Small bilateral pleural effusions, larger on the right. Minimal bilateral dependent atelectasis. Minimal pericardial effusion with a maximum thickness of 3 mm. Hepatobiliary: No focal liver abnormality is seen. Status post cholecystectomy. No biliary dilatation. Pancreas: Unremarkable. No pancreatic ductal dilatation or surrounding inflammatory changes. Spleen: Normal in size without focal abnormality. Adrenals/Urinary Tract: Normal appearing adrenal glands. 2.5 cm left renal cyst. Somewhat small right kidney. Unremarkable ureters and urinary bladder. No urinary tract calculi or hydronephrosis. Stomach/Bowel: 2 metallic clips in the stomach. Similar-appearing metallic clip in the rectum posteriorly on the right, within a small amount of stool. Unremarkable small bowel and appendix. Vascular/Lymphatic: Atheromatous arterial calcifications without aneurysm. No enlarged lymph nodes. Reproductive: Status post hysterectomy. No adnexal masses. Other: Midline surgical scar. Musculoskeletal: Minimal lumbar and lower thoracic spine degenerative changes. IMPRESSION: 1. No urinary tract calculi or hydronephrosis. 2. Small bilateral pleural effusions, larger on the right. 3. Minimal pericardial  effusion. 4. Mild to moderate right renal atrophy. 5. Two the previously placed gastric clips are in the stomach and one is in the rectum within a small amount of stool. Electronically Signed   By: Claudie Revering M.D.   On: 03/29/2018 14:20    TODAY-DAY OF DISCHARGE:  Subjective:   Kristeen Mans today has no headache,no chest abdominal pain,no new weakness tingling or numbness, feels much better wants to go home today.   Objective:   Blood pressure (!) 148/80, pulse 66, temperature 98.5 F (36.9 C), temperature source Oral, resp. rate 16, height 5\' 1"  (1.549 m), weight 48.1 kg, SpO2 100 %.  Intake/Output Summary (Last 24 hours) at 04/26/2018 0832 Last data filed at 04/25/2018 1700 Gross per 24 hour  Intake 360 ml  Output 350 ml  Net 10 ml   Filed Weights   04/24/18 0422 04/25/18 0500 04/26/18 0500  Weight: 49.8 kg 45.3 kg 48.1 kg    Exam: Awake Alert, Oriented *3, No new F.N deficits, Normal affect [place].AT,PERRAL Supple Neck,No JVD, No cervical lymphadenopathy appriciated.  Symmetrical Chest wall movement, Good  air movement bilaterally, CTAB RRR,No Gallops,Rubs or new Murmurs, No Parasternal Heave +ve B.Sounds, Abd Soft, Non tender, No organomegaly appriciated, No rebound -guarding or rigidity. No Cyanosis, Clubbing or edema, No new Rash or bruise   PERTINENT RADIOLOGIC STUDIES: Dg Abd 1 View  Result Date: 04/21/2018 CLINICAL DATA:  Confirm OG tube placement EXAM: ABDOMEN - 1 VIEW COMPARISON:  None. FINDINGS: Orogastric tube with the tip projecting over the stomach. There is no bowel dilatation to suggest obstruction. There is no evidence of pneumoperitoneum, portal venous gas or pneumatosis. There are no pathologic calcifications along the expected course of the ureters. The osseous structures are unremarkable. IMPRESSION: Orogastric tube with the tip projecting over the stomach. Electronically Signed   By: Kathreen Devoid   On: 04/21/2018 18:00   Dg Abd 1 View  Result Date:  04/02/2018 CLINICAL DATA:  Abdominal pain. EXAM: ABDOMEN - 1 VIEW COMPARISON:  CT of the abdomen and pelvis on 03/29/2018 and abdominal film on 03/02/2018 FINDINGS: No evidence of bowel obstruction, ileus or signs of free air. Status post cholecystectomy. Endoscopic hemostatic clips are present in the left upper quadrant in the region of the stomach. No abnormal calcifications. Visualized bony structures are unremarkable. IMPRESSION: No acute findings. Electronically Signed   By: Aletta Edouard M.D.   On: 04/02/2018 17:39   Dg Chest Port 1 View  Result Date: 04/22/2018 CLINICAL DATA:  Endotracheal tube placement EXAM: PORTABLE CHEST 1 VIEW COMPARISON:  04/21/2018 FINDINGS: Endotracheal tube tip between the clavicular heads and carina. The orogastric tube at least reaches the stomach. Diffuse interstitial opacity that is improved. Probable trace layering pleural effusion. Borderline heart size, stable. IMPRESSION: 1. Stable hardware positioning. 2.  Improved edema. Electronically Signed   By: Monte Fantasia M.D.   On: 04/22/2018 06:50   Dg Chest Portable 1 View  Result Date: 04/21/2018 CLINICAL DATA:  Shortness of breath.  Respiratory distress. EXAM: PORTABLE CHEST 1 VIEW COMPARISON:  04/02/2018 FINDINGS: Endotracheal tube tip is just above the carina. Retraction by 5.5 cm would place it at the level of the clavicular heads. Orogastric tube tip and side port are beyond the field of view. Interstitial opacities in the right lung have worsened since the prior study. Left lung is clear. IMPRESSION: 1. Endotracheal tube tip just above the carina. Recommend retraction by 3-5 cm. 2. Markedly worsened interstitial opacities within the right lung which may indicate infection or asymmetric pulmonary edema. Electronically Signed   By: Ulyses Jarred M.D.   On: 04/21/2018 06:45   Dg Chest Port 1 View  Result Date: 04/02/2018 CLINICAL DATA:  Shortness of breath EXAM: PORTABLE CHEST 1 VIEW COMPARISON:  Four days ago  FINDINGS: Cardiomegaly. Negative aortic contours. Interstitial coarsening similar to prior, with cephalized blood flow. Probable small right pleural effusion creating asymmetric density at the right base. No pneumothorax. IMPRESSION: Cardiomegaly with pulmonary edema and small right pleural effusion. Electronically Signed   By: Monte Fantasia M.D.   On: 04/02/2018 09:24   Dg Chest Portable 1 View  Result Date: 03/29/2018 CLINICAL DATA:  Cough, shortness of breath. EXAM: PORTABLE CHEST 1 VIEW COMPARISON:  Radiograph of March 02, 2018. FINDINGS: The heart size and mediastinal contours are within normal limits. Both lungs are clear. No pneumothorax or pleural effusion is noted. The visualized skeletal structures are unremarkable. IMPRESSION: No active disease. Electronically Signed   By: Marijo Conception, M.D.   On: 03/29/2018 12:28   Ct Renal Stone Study  Result Date: 03/29/2018 CLINICAL DATA:  Right flank pain and dysuria since yesterday. History of renal calculi. 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 EXAM: CT ABDOMEN AND PELVIS WITHOUT CONTRAST TECHNIQUE: Multidetector CT imaging of the abdomen and pelvis was performed following the standard protocol without IV contrast. COMPARISON:  Abdomen and pelvis radiograph dated 03/02/2018 and abdomen and pelvis CT dated 02/08/2009. FINDINGS: Lower chest: Small bilateral pleural effusions, larger on the right. Minimal bilateral dependent atelectasis. Minimal pericardial effusion with a maximum thickness of 3 mm. Hepatobiliary: No focal liver abnormality is seen. Status post cholecystectomy. No biliary dilatation. Pancreas: Unremarkable. No pancreatic ductal dilatation or surrounding inflammatory changes. Spleen: Normal in size without focal abnormality. Adrenals/Urinary Tract: Normal appearing adrenal glands. 2.5 cm left renal cyst. Somewhat small right kidney.  Unremarkable ureters and urinary bladder. No urinary tract calculi or hydronephrosis. Stomach/Bowel: 2 metallic clips in the stomach. Similar-appearing metallic clip in the rectum posteriorly on the right, within a small amount of stool. Unremarkable small bowel and appendix. Vascular/Lymphatic: Atheromatous arterial calcifications without aneurysm. No enlarged lymph nodes. Reproductive: Status post hysterectomy. No adnexal masses. Other: Midline surgical scar. Musculoskeletal: Minimal lumbar and lower thoracic spine degenerative changes. IMPRESSION: 1. No urinary tract calculi or hydronephrosis. 2. Small bilateral pleural effusions, larger on the right. 3. Minimal pericardial effusion. 4. Mild to moderate right renal atrophy. 5. Two the previously placed gastric clips are in the stomach and one is in the rectum within a small amount of stool. Electronically Signed   By: Claudie Revering M.D.   On: 03/29/2018 14:20     PERTINENT LAB RESULTS: CBC: Recent Labs    04/24/18 0717  WBC 17.0*  HGB 8.3*  HCT 27.5*  PLT 287   CMET CMP     Component Value Date/Time   NA 142 04/26/2018 0238   K 4.8 04/26/2018 0238   CL 109 04/26/2018 0238   CO2 23 04/26/2018 0238   GLUCOSE 129 (H) 04/26/2018 0238   BUN 40 (H) 04/26/2018 0238   CREATININE 1.54 (H) 04/26/2018 0238   CREATININE 1.20 10/08/2017 1329   CREATININE 1.23 (H) 04/24/2016 1220   CALCIUM 8.6 (L) 04/26/2018 0238   PROT 6.4 (L) 04/22/2018 0324   ALBUMIN 2.6 (L) 04/22/2018 0324   AST 23 04/22/2018 0324   AST 26 10/08/2017 1329   ALT 22 04/22/2018 0324   ALT 17 10/08/2017 1329   ALKPHOS 83 04/22/2018 0324   BILITOT 0.5 04/22/2018 0324   BILITOT 0.5 10/08/2017 1329   GFRNONAA 32 (L) 04/26/2018 0238   GFRNONAA 42 (L) 04/24/2016 1220   GFRAA 37 (L) 04/26/2018 0238   GFRAA 49 (L) 04/24/2016 1220    GFR Estimated Creatinine Clearance: 22.4 mL/min (A) (by C-G formula based on SCr of 1.54 mg/dL (H)). No results for input(s): LIPASE, AMYLASE  in the last 72 hours. No results for input(s): CKTOTAL, CKMB, CKMBINDEX, TROPONINI in the last 72 hours. Invalid input(s): POCBNP No results for input(s): DDIMER in the last 72 hours. No results for input(s): HGBA1C in the last 72 hours. No results for input(s): CHOL, HDL, LDLCALC, TRIG, CHOLHDL, LDLDIRECT in the last 72 hours. No results for input(s): TSH, T4TOTAL, T3FREE, THYROIDAB in the last 72 hours.  Invalid input(s): FREET3 No results for input(s): VITAMINB12, FOLATE, FERRITIN, TIBC, IRON, RETICCTPCT in the last 72 hours. Coags: Recent Labs    04/25/18 0212  INR 1.3*   Microbiology: Recent Results (from the past 240 hour(s))  Blood culture (routine x 2)     Status: None (Preliminary result)   Collection Time: 04/21/18  6:00 AM  Result Value Ref Range Status   Specimen Description BLOOD RIGHT ARM  Final   Special Requests   Final    BOTTLES DRAWN AEROBIC AND ANAEROBIC Blood Culture results may not be optimal due to an excessive volume of blood received in culture bottles   Culture   Final    NO GROWTH 4 DAYS Performed at [facility], [place] [address], [place], [place] [address]    Report Status PENDING  Incomplete  Blood culture (routine x 2)     Status: None (Preliminary result)   Collection Time: 04/21/18  6:30 AM  Result Value Ref Range Status   Specimen Description BLOOD LEFT ARM  Final   Special Requests   Final    BOTTLES DRAWN AEROBIC ONLY Blood Culture adequate volume   Culture   Final    NO GROWTH 4 DAYS Performed at [facility], [place] [address] [place], [place] [address]    Report Status PENDING  Incomplete  Urine culture     Status: None   Collection Time: 04/21/18  9:07 AM  Result Value Ref Range Status   Specimen Description URINE, CATHETERIZED  Final   Special Requests NONE  Final   Culture   Final    NO GROWTH Performed at [facility], [address] [place], [place] [address]    Report Status 04/22/2018 FINAL  Final   Respiratory Panel by PCR     Status: None   Collection Time: 04/21/18  9:54 AM  Result Value Ref Range Status   Adenovirus NOT DETECTED NOT DETECTED Final   Coronavirus 229E NOT DETECTED NOT DETECTED Final    Comment: (NOTE) The Coronavirus on the Respiratory Panel, DOES NOT test for the novel  Coronavirus (2019 nCoV)    Coronavirus HKU1 NOT DETECTED NOT DETECTED Final   Coronavirus NL63 NOT DETECTED NOT DETECTED Final   Coronavirus OC43 NOT DETECTED NOT DETECTED Final   Metapneumovirus NOT DETECTED NOT DETECTED Final   Rhinovirus / Enterovirus NOT DETECTED NOT DETECTED Final   Influenza A NOT DETECTED NOT DETECTED Final   Influenza B NOT DETECTED NOT DETECTED Final   Parainfluenza Virus 1 NOT DETECTED NOT DETECTED Final   Parainfluenza Virus 2 NOT DETECTED NOT DETECTED Final   Parainfluenza Virus 3 NOT DETECTED NOT DETECTED Final   Parainfluenza Virus 4 NOT DETECTED NOT DETECTED Final   Respiratory Syncytial Virus NOT DETECTED NOT DETECTED Final   Bordetella pertussis NOT DETECTED NOT DETECTED Final   Chlamydophila pneumoniae NOT DETECTED NOT DETECTED Final   Mycoplasma pneumoniae NOT DETECTED NOT DETECTED Final    Comment: Performed at [facility], [place], [place], [place] [address]  Novel Coronavirus, NAA (hospital order; send-out to ref lab)     Status: None   Collection Time: 04/21/18  9:54 AM  Result Value Ref Range Status   SARS-CoV-2, NAA NOT DETECTED NOT DETECTED Final    Comment: Negative (Not Detected) results do not exclude infection caused by SARS CoV 2 and should not be used as the sole basis for treatment or other patient management decisions. Optimum specimen types and timing for peak viral levels during infections caused  by SARS CoV 2 have not been determined. Collection of multiple specimens (types and time points) from the same patient may be necessary to detect the virus. Improper specimen collection  and handling, sequence variability underlying  assay primers and or probes, or the presence of organisms in  quantities less than the limit of detection of the assay may lead to false negative results. Positive and negative predictive values of testing are highly dependent on prevalence. False negative results are more likely when prevalence of disease is high. (NOTE) The expected result is Negative (Not Detected). The SARS CoV 2 test is intended for the presumptive qualitative  detection of nucleic acid from SARS CoV 2 in upper and lower  respir atory specimens. Testing methodology is real time RT PCR. Test results must be correlated with clinical presentation and  evaluated in the context of other laboratory and epidemiologic data.  Test performance can be affected because the epidemiology and  clinical spectrum of infection caused by SARS CoV 2 is not fully  known. For example, the optimum types of specimens to collect and  when during the course of infection these specimens are most likely  to contain detectable viral RNA may not be known. This test has not been Food and Drug Administration (FDA) cleared or  approved and has been authorized by FDA under an Emergency Use  Authorization (EUA). The test is only authorized for the duration of  the declaration that circumstances exist justifying the authorization  of emergency use of in vitro diagnostic tests for detection and or  diagnosis of SARS CoV 2 under Section 564(b)(1) of the Act, 21 U.S.C.  section [phone] 3(b)(1), unless the authorization is terminated or   revoked sooner. [facility] is certified under the  Clinical Laboratory Improvement Amendments of 1988 (CLIA), 42 U.S.C.  section [phone], to perform high complexity tests. Performed at [id number] [address], [address], [place], [place] [address] Laboratory Director: Loleta Books, MD Fact Sheet for Healthcare Providers   BankingDealers.co.za Fact Sheet for Patients  StrictlyIdeas.no    Coronavirus Source NASOPHARYNGEAL  Final    Comment: Performed at [facility], [address] [place], [place] [address]  MRSA PCR Screening     Status: None   Collection Time: 04/21/18  4:49 PM  Result Value Ref Range Status   MRSA by PCR NEGATIVE NEGATIVE Final    Comment:        The GeneXpert MRSA Assay (FDA approved for NASAL specimens only), is one component of a comprehensive MRSA colonization surveillance program. It is not intended to diagnose MRSA infection nor to guide or monitor treatment for MRSA infections. Performed at [facility], [place] [address], [place], [place] [address]     FURTHER DISCHARGE INSTRUCTIONS:  Get Medicines reviewed and adjusted: Please take all your medications with you for your next visit with your Primary MD  Laboratory/radiological data: Please request your Primary MD to go over all hospital tests and procedure/radiological results at the follow up, please ask your Primary MD to get all Hospital records sent to his/her office.  In some cases, they will be blood work, cultures and biopsy results pending at the time of your discharge. Please request that your primary care M.D. goes through all the records of your hospital data and follows up on these results.  Also Note the following: If you experience worsening of your admission symptoms, develop shortness of breath, life threatening emergency, suicidal or homicidal thoughts you must seek medical attention immediately by calling 911 or calling your MD immediately  if symptoms less severe.  You must read complete instructions/literature  along with all the possible adverse reactions/side effects for all the Medicines you take and that have been prescribed to you. Take any new Medicines after you have completely understood and accpet all the possible adverse reactions/side  effects.   Do not drive when taking Pain medications or sleeping medications (Benzodaizepines)  Do not take more than prescribed Pain, Sleep and Anxiety Medications. It is not advisable to combine anxiety,sleep and pain medications without talking with your primary care practitioner  Special Instructions: If you have smoked or chewed Tobacco  in the last 2 yrs please stop smoking, stop any regular Alcohol  and or any Recreational drug use.  Wear Seat belts while driving.  Please note: You were cared for by a hospitalist during your hospital stay. Once you are discharged, your primary care physician will handle any further medical issues. Please note that NO REFILLS for any discharge medications will be authorized once you are discharged, as it is imperative that you return to your primary care physician (or establish a relationship with a primary care physician if you do not have one) for your post hospital discharge needs so that they can reassess your need for medications and monitor your lab values.  Total Time spent coordinating discharge including counseling, education and face to face time equals 35 minutes.  SignedOren Binet 04/26/2018 8:32 AM

## 2018-04-29 ENCOUNTER — Telehealth: Payer: Self-pay

## 2018-04-29 ENCOUNTER — Ambulatory Visit: Payer: Self-pay

## 2018-04-29 ENCOUNTER — Ambulatory Visit: Payer: Self-pay | Admitting: Internal Medicine

## 2018-04-29 NOTE — Telephone Encounter (Signed)
I left the pt a message.  I called the pt and left a message that Dr. Baird Cancer wanted me to check on her and to schedule her an appt for virtual hospital f/u.

## 2018-04-29 NOTE — Chronic Care Management (AMB) (Signed)
  Chronic Care Management   Social Work Note  04/29/2018 Name: [name] MRN: [id number] DOB: 1940/10/11  [name] is a 78 y.o. year old female who sees Glendale Chard, MD for primary care. The CCM team was consulted for assistance with [employer].   I placed a follow up call to the patient on today's date in response to the patients discharge from an inpatient stay. The patient reports she is currently back at [facility] for rehab. Today's call was brief as the patient was participating in a therapy session during the time of my call.   Follow Up Plan: SW will follow up with patient by phone over the next 2 days.  [name], BSW, CDP TIM[facility] / [facility] Care Management Social Worker [phone]  Total time spent performing care coordination and/or care management activities with the patient by phone or face to face = 5 minutes.

## 2018-04-30 DIAGNOSIS — N183 Chronic kidney disease, stage 3 (moderate): Secondary | ICD-10-CM | POA: Diagnosis not present

## 2018-04-30 DIAGNOSIS — R569 Unspecified convulsions: Secondary | ICD-10-CM | POA: Diagnosis not present

## 2018-04-30 DIAGNOSIS — I5033 Acute on chronic diastolic (congestive) heart failure: Secondary | ICD-10-CM | POA: Diagnosis not present

## 2018-04-30 DIAGNOSIS — I10 Essential (primary) hypertension: Secondary | ICD-10-CM | POA: Diagnosis not present

## 2018-05-01 ENCOUNTER — Other Ambulatory Visit: Payer: Self-pay

## 2018-05-01 ENCOUNTER — Ambulatory Visit: Payer: Self-pay

## 2018-05-01 DIAGNOSIS — R4189 Other symptoms and signs involving cognitive functions and awareness: Secondary | ICD-10-CM

## 2018-05-01 DIAGNOSIS — M069 Rheumatoid arthritis, unspecified: Secondary | ICD-10-CM

## 2018-05-01 DIAGNOSIS — N183 Chronic kidney disease, stage 3 unspecified: Secondary | ICD-10-CM

## 2018-05-01 NOTE — Chronic Care Management (AMB) (Signed)
  Chronic Care Management   Social Work Note  05/01/2018 Name: EMOJEAN GERTZ MRN: [id number] DOB: June 20, 1940  [name] is a 78 y.o. year old female who sees Glendale Chard, MD for primary care. The CCM team was consulted for assistance with Food Insecurity, Community Resources and Level of Care Concerns.   I placed a call to both the patient and the discharge planner with [facility] to check on the status of the patient and discuss discharge plans. Both calls were unsuccessful. SW left a voice message for the patient as well as [place] Discharge Planning Director.  Follow Up Plan: SW will follow up with patient by phone over the next week.  [name], BSW, CDP TIM[facility] / [facility] Care Management Social Worker [phone]  Total time spent performing care coordination and/or care management activities with the patient by phone or face to face = 10 minutes.

## 2018-05-03 DIAGNOSIS — N179 Acute kidney failure, unspecified: Secondary | ICD-10-CM | POA: Diagnosis not present

## 2018-05-03 DIAGNOSIS — N183 Chronic kidney disease, stage 3 (moderate): Secondary | ICD-10-CM | POA: Diagnosis not present

## 2018-05-03 DIAGNOSIS — D72829 Elevated white blood cell count, unspecified: Secondary | ICD-10-CM | POA: Diagnosis not present

## 2018-05-03 DIAGNOSIS — E860 Dehydration: Secondary | ICD-10-CM | POA: Diagnosis not present

## 2018-05-03 DIAGNOSIS — R627 Adult failure to thrive: Secondary | ICD-10-CM | POA: Diagnosis not present

## 2018-05-06 ENCOUNTER — Telehealth: Payer: Self-pay

## 2018-05-06 DIAGNOSIS — E860 Dehydration: Secondary | ICD-10-CM | POA: Diagnosis not present

## 2018-05-06 DIAGNOSIS — R627 Adult failure to thrive: Secondary | ICD-10-CM | POA: Diagnosis not present

## 2018-05-06 DIAGNOSIS — N183 Chronic kidney disease, stage 3 (moderate): Secondary | ICD-10-CM | POA: Diagnosis not present

## 2018-05-06 DIAGNOSIS — I5030 Unspecified diastolic (congestive) heart failure: Secondary | ICD-10-CM | POA: Diagnosis not present

## 2018-05-06 DIAGNOSIS — N179 Acute kidney failure, unspecified: Secondary | ICD-10-CM | POA: Diagnosis not present

## 2018-05-06 NOTE — Telephone Encounter (Signed)
I called and notified pt that the [facility] form her husband requested to be filled out has been completed and he can come pick it up also pt stated she is in a nursing home. YRL,RMA

## 2018-05-07 DIAGNOSIS — M79671 Pain in right foot: Secondary | ICD-10-CM | POA: Diagnosis not present

## 2018-05-07 DIAGNOSIS — N183 Chronic kidney disease, stage 3 (moderate): Secondary | ICD-10-CM | POA: Diagnosis not present

## 2018-05-07 DIAGNOSIS — R600 Localized edema: Secondary | ICD-10-CM | POA: Diagnosis not present

## 2018-05-07 DIAGNOSIS — M79672 Pain in left foot: Secondary | ICD-10-CM | POA: Diagnosis not present

## 2018-05-07 DIAGNOSIS — N179 Acute kidney failure, unspecified: Secondary | ICD-10-CM | POA: Diagnosis not present

## 2018-05-07 DIAGNOSIS — E860 Dehydration: Secondary | ICD-10-CM | POA: Diagnosis not present

## 2018-05-08 DIAGNOSIS — E860 Dehydration: Secondary | ICD-10-CM | POA: Diagnosis not present

## 2018-05-08 DIAGNOSIS — N179 Acute kidney failure, unspecified: Secondary | ICD-10-CM | POA: Diagnosis not present

## 2018-05-08 DIAGNOSIS — M10079 Idiopathic gout, unspecified ankle and foot: Secondary | ICD-10-CM | POA: Diagnosis not present

## 2018-05-08 DIAGNOSIS — N183 Chronic kidney disease, stage 3 (moderate): Secondary | ICD-10-CM | POA: Diagnosis not present

## 2018-05-08 DIAGNOSIS — M79672 Pain in left foot: Secondary | ICD-10-CM | POA: Diagnosis not present

## 2018-05-08 DIAGNOSIS — M79671 Pain in right foot: Secondary | ICD-10-CM | POA: Diagnosis not present

## 2018-05-09 DIAGNOSIS — N390 Urinary tract infection, site not specified: Secondary | ICD-10-CM | POA: Diagnosis not present

## 2018-05-09 DIAGNOSIS — E860 Dehydration: Secondary | ICD-10-CM | POA: Diagnosis not present

## 2018-05-09 DIAGNOSIS — N179 Acute kidney failure, unspecified: Secondary | ICD-10-CM | POA: Diagnosis not present

## 2018-05-09 DIAGNOSIS — N183 Chronic kidney disease, stage 3 (moderate): Secondary | ICD-10-CM | POA: Diagnosis not present

## 2018-05-10 DIAGNOSIS — M069 Rheumatoid arthritis, unspecified: Secondary | ICD-10-CM | POA: Diagnosis not present

## 2018-05-10 DIAGNOSIS — R5383 Other fatigue: Secondary | ICD-10-CM | POA: Diagnosis not present

## 2018-05-10 DIAGNOSIS — N183 Chronic kidney disease, stage 3 (moderate): Secondary | ICD-10-CM | POA: Diagnosis not present

## 2018-05-10 DIAGNOSIS — N179 Acute kidney failure, unspecified: Secondary | ICD-10-CM | POA: Diagnosis not present

## 2018-05-10 DIAGNOSIS — N390 Urinary tract infection, site not specified: Secondary | ICD-10-CM | POA: Diagnosis not present

## 2018-05-13 ENCOUNTER — Ambulatory Visit: Payer: Self-pay

## 2018-05-13 DIAGNOSIS — M069 Rheumatoid arthritis, unspecified: Secondary | ICD-10-CM | POA: Diagnosis not present

## 2018-05-13 DIAGNOSIS — R627 Adult failure to thrive: Secondary | ICD-10-CM | POA: Diagnosis not present

## 2018-05-13 DIAGNOSIS — N183 Chronic kidney disease, stage 3 unspecified: Secondary | ICD-10-CM

## 2018-05-13 DIAGNOSIS — I5030 Unspecified diastolic (congestive) heart failure: Secondary | ICD-10-CM | POA: Diagnosis not present

## 2018-05-13 DIAGNOSIS — I2111 ST elevation (STEMI) myocardial infarction involving right coronary artery: Secondary | ICD-10-CM

## 2018-05-13 DIAGNOSIS — M6281 Muscle weakness (generalized): Secondary | ICD-10-CM | POA: Diagnosis not present

## 2018-05-13 DIAGNOSIS — E43 Unspecified severe protein-calorie malnutrition: Secondary | ICD-10-CM | POA: Diagnosis not present

## 2018-05-13 NOTE — Patient Instructions (Signed)
Social Worker Visit Information  Goals we discussed today:  Goals Addressed            This Visit's Progress   . Assist patient/spouse with care coordination of patient transition to home setting from SNF       Current Barriers:  . Financial constraints . Limited social support . Limited access to food . Level of care concerns . Inability to perform ADL's independently . Inability to perform IADL's independently  Clinical Social Work Clinical Goal(s):  [name] Over the next 30 days, patient will work with SW to address concerns related to patient transition home from SNF rehab stay  Interventions: . Collaboration with Sudie Bailey of discharge planning at [employer] who reports the patient's spouse would like the patient to discharge immediately due to concerns of co-pay costs. Mrs. Pearletha Furl reports the patient has not been medically stable to D/C due to a recent UTI and developing a prolonged QT interval as a results of adverse medication interaction.  . Discussed plan to determine when the patient will be medically stable to discharge- the patient will be seen by SNF NP today to determine the patients progress with recent condition . Determined the patients spouse has applied for LTC Medicaid but has yet to hear if the patients application is approved . Unsuccessful telephonic outreach to the patients spouse by [place] patient on SW schedule to follow up on status within the next week .   Patient Self Care Activities:  . Currently UNABLE TO independently care for self  Initial goal documentation;        Materials Provided: No. Patient not reached.  Follow Up Plan: SW will follow up with patient by phone over the next week.  [name], BSW, CDP [facility] / [facility] Care Management Social Worker [phone]

## 2018-05-13 NOTE — Chronic Care Management (AMB) (Signed)
  Chronic Care Management    Clinical Social Work Follow Up Note  05/13/2018 Name: DEVLYN PARISH MRN: [id number] DOB: 1940-09-15  [name] is a 78 y.o. year old female who is a primary care patient of Glendale Chard, MD. The CCM team was consulted for assistance with [employer].   Review of patient status, including review of consultants reports, other relevant assessments, and collaboration with appropriate care team members and the patient's provider was performed as part of comprehensive patient evaluation and provision of chronic care management services.    I received a return call from Eugenie Filler, Mudlogger of discharge planning at [facility].   Goals Addressed            This Visit's Progress   . Assist patient/spouse with care coordination of patient transition to home setting from SNF       Current Barriers:  . Financial constraints . Limited social support . Limited access to food . Level of care concerns . Inability to perform ADL's independently . Inability to perform IADL's independently  Clinical Social Work Clinical Goal(s):  [name] Over the next 30 days, patient will work with SW to address concerns related to patient transition home from SNF rehab stay  Interventions: . Collaboration with Sudie Bailey of discharge planning at [employer] who reports the patient's spouse would like the patient to discharge immediately due to concerns of co-pay costs. Mrs. Pearletha Furl reports the patient has not been medically stable to D/C due to a recent UTI and developing a prolonged QT interval as a results of adverse medication interaction.  . Discussed plan to determine when the patient will be medically stable to discharge- the patient will be seen by SNF NP today to determine the patients progress with recent condition . Determined the patients spouse has applied for LTC Medicaid but has yet to hear if the patients application is  approved . Unsuccessful telephonic outreach to the patients spouse by [place] patient on SW schedule to follow up on status within the next week .   Patient Self Care Activities:  . Currently UNABLE TO independently care for self  Initial goal documentation;        Follow Up Plan: SW will follow up with patient by phone over the next week.   [name], BSW, CDP [facility] / [facility] Care Management Social Worker [phone]  Total time spent performing care coordination and/or care management activities with the patient by phone or face to face = 15 minutes.

## 2018-05-13 NOTE — Chronic Care Management (AMB) (Signed)
  Chronic Care Management   Social Work Note  05/13/2018 Name: [name] MRN: [id number] DOB: Nov 21, 1940  [name] is a 78 y.o. year old female who sees Glendale Chard, MD for primary care. The CCM team was consulted for assistance with [employer].   Unsuccessful call to the patient and her spouse to discuss patients long term plan of care. SW left HIPAA compliant voice message for both parties requesting a return call.  SW also placed a call to [facility] and left a message for director of discharge planning, Eugenie Filler requesting a patient update.  Follow Up Plan: SW will follow up with patient by phone over the next 7-10 days.  [name], BSW, CDP TIMA / [facility] Care Management Social Worker [phone]  Total time spent performing care coordination and/or care management activities with the patient by phone or face to face = 5 minutes.

## 2018-05-14 ENCOUNTER — Ambulatory Visit: Payer: Self-pay

## 2018-05-14 DIAGNOSIS — M069 Rheumatoid arthritis, unspecified: Secondary | ICD-10-CM

## 2018-05-14 DIAGNOSIS — N179 Acute kidney failure, unspecified: Secondary | ICD-10-CM

## 2018-05-14 DIAGNOSIS — I5032 Chronic diastolic (congestive) heart failure: Secondary | ICD-10-CM | POA: Diagnosis not present

## 2018-05-14 DIAGNOSIS — Z9181 History of falling: Secondary | ICD-10-CM

## 2018-05-14 DIAGNOSIS — I130 Hypertensive heart and chronic kidney disease with heart failure and stage 1 through stage 4 chronic kidney disease, or unspecified chronic kidney disease: Secondary | ICD-10-CM | POA: Diagnosis not present

## 2018-05-14 DIAGNOSIS — G40909 Epilepsy, unspecified, not intractable, without status epilepticus: Secondary | ICD-10-CM

## 2018-05-14 DIAGNOSIS — N183 Chronic kidney disease, stage 3 unspecified: Secondary | ICD-10-CM

## 2018-05-14 DIAGNOSIS — Z7952 Long term (current) use of systemic steroids: Secondary | ICD-10-CM

## 2018-05-14 DIAGNOSIS — D631 Anemia in chronic kidney disease: Secondary | ICD-10-CM | POA: Diagnosis not present

## 2018-05-14 DIAGNOSIS — I2510 Atherosclerotic heart disease of native coronary artery without angina pectoris: Secondary | ICD-10-CM | POA: Diagnosis not present

## 2018-05-14 DIAGNOSIS — M6281 Muscle weakness (generalized): Secondary | ICD-10-CM | POA: Diagnosis not present

## 2018-05-14 DIAGNOSIS — R627 Adult failure to thrive: Secondary | ICD-10-CM

## 2018-05-14 DIAGNOSIS — I2111 ST elevation (STEMI) myocardial infarction involving right coronary artery: Secondary | ICD-10-CM

## 2018-05-14 DIAGNOSIS — Z90710 Acquired absence of both cervix and uterus: Secondary | ICD-10-CM

## 2018-05-14 DIAGNOSIS — Z9049 Acquired absence of other specified parts of digestive tract: Secondary | ICD-10-CM

## 2018-05-14 DIAGNOSIS — J810 Acute pulmonary edema: Secondary | ICD-10-CM | POA: Diagnosis not present

## 2018-05-14 DIAGNOSIS — Z8719 Personal history of other diseases of the digestive system: Secondary | ICD-10-CM

## 2018-05-14 DIAGNOSIS — Z955 Presence of coronary angioplasty implant and graft: Secondary | ICD-10-CM

## 2018-05-14 DIAGNOSIS — J9601 Acute respiratory failure with hypoxia: Secondary | ICD-10-CM | POA: Diagnosis not present

## 2018-05-14 DIAGNOSIS — M47894 Other spondylosis, thoracic region: Secondary | ICD-10-CM | POA: Diagnosis not present

## 2018-05-14 DIAGNOSIS — I252 Old myocardial infarction: Secondary | ICD-10-CM

## 2018-05-14 DIAGNOSIS — I5031 Acute diastolic (congestive) heart failure: Secondary | ICD-10-CM | POA: Diagnosis not present

## 2018-05-14 DIAGNOSIS — G9341 Metabolic encephalopathy: Secondary | ICD-10-CM | POA: Diagnosis not present

## 2018-05-14 NOTE — Chronic Care Management (AMB) (Signed)
Chronic Care Management    Clinical Social Work Follow Up Note  05/14/2018 Name: [name] MRN: [id number] DOB: 1940/09/05  [name] is a 78 y.o. year old female who is a primary care patient of Becky Chard, MD. The CCM team was consulted for assistance with Transportation Resources, Food Insecurity, [employer] and Level of Care Concerns.   Review of patient status, including review of consultants reports, other relevant assessments, and collaboration with appropriate care team members and the patient's provider was performed as part of comprehensive patient evaluation and provision of chronic care management services.    I placed an outreach call to the patients spouse/caregiver [name] whom the patient has authorized this Probation officer to speak with in response to the patients recent return home from SNF stay.   Goals Addressed            This Visit's Progress     Patient Stated   . "I need a ramp" (pt-stated)       Current Barriers:  . Financial constraints . Lacks knowledge of community resource: to assist with ramp installation  Clinical Social Work Clinical Goal(s):  [name] Over the next 30 days, client will work with SW to address concerns related to the ability to exit her home safely - . Over the next 60 days, the patient will follow up with community agencies, as directed by SW  Interventions:  Telephonic follow up by CCM SW to the patients spouse  The patient returned home from recent SNF stay on 05/13/18  [name] reports speaking to someone on the phone regarding a ramp but unable to recall the agency  Collaboration with [name] coordinator with [place] whom reports she is awaiting supporting evidence from patients physician regarding patients ability to qualify for the program  Collaboration with patients primary provider regarding information to be submitted to [name]  Follow up with [name]  e-mail to inquire status of patient referral to the [employer] - awaiting return response  Patient Self Care Activities:  . Currently UNABLE TO independently exit her home due to concerns with mobility. The patient relies on her spouse and grand-children for assistance   Please see past updates related to this goal by clicking on the "Past Updates" button in the selected goal     . "I want meals on wheels" (pt-stated)   On track    Current Barriers:  . Limited access to caregiver . Inability to perform ADL's independently . Inability to perform IADL's independently . Lacks knowledge of community resource: [employer] through [employer] [facility] [place]  Clinical Social Work Clinical Goal(s):  [name] Over the next 30 days, client will work with SW to address concerns related to mobile meals  Interventions:  Confirmed the patient has been placed on the wait list to receive mobile meals through senior resources of [employer] on wheels program  Collaboration with the patients payor plan to assess if the patients benefits cover [place] the patients benefits cover her ability to order 8 meals via "[employer]oms Meals" at the cost of $50.00  Collaboration with [place] Management assistance director regarding patient disposition in the home and multiple social determinants of health  Referred the patient to [place] 19 meal program  Patient Self Care Activities:  . Currently UNABLE TO independently prepare her own meals   Please see past updates related to this goal by clicking on  the "Past Updates" button in the selected goal     . COMPLETED: Assist patient/spouse with care coordination of patient transition to home setting from SNF (pt-stated)       Current Barriers:  . Financial constraints . Limited social support . Limited access to food . Level of care concerns . Inability to perform ADL's independently . Inability to perform IADL's  independently  Clinical Social Work Clinical Goal(s):  [name] Over the next 30 days, patient will work with SW to address concerns related to patient transition home from SNF rehab stay  Interventions: . Telephonic follow up by CCM SW to the patients spouse to determine the patient returned home the afternoon of 05/13/18 . The patient is relying on her grand-son to assist with caregiver needs while [name] works . Collaboration with [facility] to confirm disciplines ordered and start of care date- the patient will receive RN/PT/OT in the home. SW requested the team assess for eligibility to receive an aide in the home as well . Collaboration with patients primary provider to update on the patients disposition . Collaboration with CCM team including RNCM and pharmacist for planned follow up  Patient Self Care Activities:  . Currently UNABLE TO independently care for self  Please see past updates related to this goal by clicking on the "Past Updates" button in the selected goal ;        Follow Up Plan: SW will follow up with patient by phone over the next three days   [name], BSW, CDP TIM[facility] / [facility] Management Social Worker [phone]  Total time spent performing care coordination and/or care management activities with the patient by phone or face to face = 50 minutes.

## 2018-05-14 NOTE — Patient Instructions (Signed)
Social Worker Visit Information  Goals we discussed today:  Goals Addressed            This Visit's Progress     Patient Stated   . "I need a ramp" (pt-stated)       Current Barriers:  . Financial constraints . Lacks knowledge of community resource: to assist with ramp installation  Clinical Social Work Clinical Goal(s):  [name] Over the next 30 days, client will work with SW to address concerns related to the ability to exit her home safely - . Over the next 60 days, the patient will follow up with community agencies, as directed by SW  Interventions:  Telephonic follow up by CCM SW to the patients spouse  The patient returned home from recent SNF stay on 05/13/18  Mr Werst reports speaking to someone on the phone regarding a ramp but unable to recall the agency  Collaboration with Michel Harrow coordinator with [place] whom reports she is awaiting supporting evidence from patients physician regarding patients ability to qualify for the program  Collaboration with patients primary provider regarding information to be submitted to Mrs Luvenia Heller  Follow up with [place] e-mail to inquire status of patient referral to the [employer] - awaiting return response  Patient Self Care Activities:  . Currently UNABLE TO independently exit her home due to concerns with mobility. The patient relies on her spouse and grand-children for assistance   Please see past updates related to this goal by clicking on the "Past Updates" button in the selected goal      . "I want meals on wheels" (pt-stated)   On track    Current Barriers:  . Limited access to caregiver . Inability to perform ADL's independently . Inability to perform IADL's independently . Lacks knowledge of community resource: [employer] through [employer] [facility] [place]  Clinical Social Work Clinical Goal(s):  [name] Over the next 30 days, client will work with SW to  address concerns related to mobile meals  Interventions:  Confirmed the patient has been placed on the wait list to receive mobile meals through senior resources of [employer] on wheels program  Collaboration with the patients payor plan to assess if the patients benefits cover [place] the patients benefits cover her ability to order 8 meals via "[employer]oms [employer]" at the cost of $50.00  Collaboration with [place] Management assistance director regarding patient disposition in the home and multiple social determinants of health  Referred the patient to [place] 19 meal program  Patient Self Care Activities:  . Currently UNABLE TO independently prepare her own meals   Please see past updates related to this goal by clicking on the "Past Updates" button in the selected goal      . COMPLETED: Assist patient/spouse with care coordination of patient transition to home setting from SNF (pt-stated)       Current Barriers:  . Financial constraints . Limited social support . Limited access to food . Level of care concerns . Inability to perform ADL's independently . Inability to perform IADL's independently  Clinical Social Work Clinical Goal(s):  [name] Over the next 30 days, patient will work with SW to address concerns related to patient transition home from SNF rehab stay  Interventions: . Telephonic follow up by CCM SW to the patients spouse to determine the patient returned home the afternoon of 05/13/18 . The patient is relying on her grand-son  to assist with caregiver needs while Mr Haze[place] works . Collaboration with [facility] to confirm disciplines ordered and start of care date- the patient will receive RN/PT/OT in the home. SW requested the team assess for eligibility to receive an aide in the home as well . Collaboration with patients primary provider to update on the patients disposition . Collaboration with CCM team including RNCM and pharmacist for  planned follow up  Patient Self Care Activities:  . Currently UNABLE TO independently care for self  Please see past updates related to this goal by clicking on the "Past Updates" button in the selected goal ;        Materials Provided: Verbal education about community resources provided by phone  Follow Up Plan: SW will follow up with patient by phone over the next three days   [name], [place], CDP TIMA / [place] Management Social Worker [phone]

## 2018-05-15 ENCOUNTER — Ambulatory Visit: Payer: Self-pay

## 2018-05-15 DIAGNOSIS — N183 Chronic kidney disease, stage 3 unspecified: Secondary | ICD-10-CM

## 2018-05-15 DIAGNOSIS — I130 Hypertensive heart and chronic kidney disease with heart failure and stage 1 through stage 4 chronic kidney disease, or unspecified chronic kidney disease: Secondary | ICD-10-CM | POA: Diagnosis not present

## 2018-05-15 DIAGNOSIS — G9341 Metabolic encephalopathy: Secondary | ICD-10-CM | POA: Diagnosis not present

## 2018-05-15 DIAGNOSIS — M069 Rheumatoid arthritis, unspecified: Secondary | ICD-10-CM | POA: Diagnosis not present

## 2018-05-15 DIAGNOSIS — J9601 Acute respiratory failure with hypoxia: Secondary | ICD-10-CM | POA: Diagnosis not present

## 2018-05-15 DIAGNOSIS — I2510 Atherosclerotic heart disease of native coronary artery without angina pectoris: Secondary | ICD-10-CM | POA: Diagnosis not present

## 2018-05-15 DIAGNOSIS — M47894 Other spondylosis, thoracic region: Secondary | ICD-10-CM | POA: Diagnosis not present

## 2018-05-15 DIAGNOSIS — D631 Anemia in chronic kidney disease: Secondary | ICD-10-CM | POA: Diagnosis not present

## 2018-05-15 DIAGNOSIS — I2111 ST elevation (STEMI) myocardial infarction involving right coronary artery: Secondary | ICD-10-CM

## 2018-05-15 DIAGNOSIS — I5031 Acute diastolic (congestive) heart failure: Secondary | ICD-10-CM | POA: Diagnosis not present

## 2018-05-15 NOTE — Chronic Care Management (AMB) (Signed)
  Chronic Care Management    Clinical Social Work Follow Up Note  05/15/2018 Name: [name] MRN: [id number] DOB: [birth year]  [name] is a 78 y.o. year old female who is a primary care patient of Glendale Chard, MD. The CCM team was consulted for assistance with Level of Care Concerns.   Review of patient status, including review of consultants reports, other relevant assessments, and collaboration with appropriate care team members and the patient's provider was performed as part of comprehensive patient evaluation and provision of chronic care management services.     Goals Addressed            This Visit's Progress     Patient Stated   . "I need a ramp" (pt-stated)       Current Barriers:  . Financial constraints . Lacks knowledge of community resource: to assist with ramp installation  Clinical Social Work Clinical Goal(s):  [name] Over the next 30 days, client will work with SW to address concerns related to the ability to exit her home safely - . Over the next 60 days, the patient will follow up with community agencies, as directed by SW  Interventions:  Telephonic follow up by CCM SW to the patients spouse  Collaboration with Training and development officer via e-mail correspondence requesting assistance in obtaining documents to support patients need for a ramp  Patient Self Care Activities:  . Currently UNABLE TO independently exit her home due to concerns with mobility. The patient relies on her spouse and grand-children for assistance   Please see past updates related to this goal by clicking on the "Past Updates" button in the selected goal      . COMPLETED: "I want meals on wheels" (pt-stated)       Current Barriers:  . Limited access to caregiver . Inability to perform ADL's independently . Inability to perform IADL's independently . Lacks knowledge of community resource: [employer] through [employer] [place]  Clinical Social Work  Clinical Goal(s):  [name] Over the next 30 days, client will work with SW to address concerns related to mobile meals  Interventions:  Telephonic follow up by CCM SW to patients spouse [name]  Educated on the patients change in [place] [employer]ine benefit  Informed of patients referral to [place] meal program  Advised Mr Amesquita to expect a Friday delivery of patient meals - Mr Bosques will be at work but will alert his grandson of expected delivery  Patient Self Care Activities:  . Currently UNABLE TO independently prepare her own meals   Please see past updates related to this goal by clicking on the "Past Updates" button in the selected goal          Follow Up Plan: SW will follow up with patient by phone over the next 7 days   [name], [place], CDP [facility] / [place] Worker [phone]  Total time spent performing care coordination and/or care management activities with the patient by phone or face to face = 12 minutes.

## 2018-05-15 NOTE — Patient Instructions (Signed)
Social Worker Visit Information  Goals we discussed today:  Goals Addressed            This Visit's Progress     Patient Stated   . "I need a ramp" (pt-stated)       Current Barriers:  . Financial constraints . Lacks knowledge of community resource: to assist with ramp installation  Clinical Social Work Clinical Goal(s):  [name] Over the next 30 days, client will work with SW to address concerns related to the ability to exit her home safely - . Over the next 60 days, the patient will follow up with community agencies, as directed by SW  Interventions:  Telephonic follow up by CCM SW to the patients spouse  Collaboration with Training and development officer via e-mail correspondence requesting assistance in obtaining documents to support patients need for a ramp  Patient Self Care Activities:  . Currently UNABLE TO independently exit her home due to concerns with mobility. The patient relies on her spouse and grand-children for assistance   Please see past updates related to this goal by clicking on the "Past Updates" button in the selected goal      . COMPLETED: "I want meals on wheels" (pt-stated)       Current Barriers:  . Limited access to caregiver . Inability to perform ADL's independently . Inability to perform IADL's independently . Lacks knowledge of community resource: [employer] through [employer] [place]  Clinical Social Work Clinical Goal(s):  [name] Over the next 30 days, client will work with SW to address concerns related to mobile meals  Interventions:  Telephonic follow up by CCM SW to patients spouse [name]  Educated on the patients change in [place] Well Dine benefit  Informed of patients referral to [place] meal program  Advised Mr Illingworth to expect a Friday delivery of patient meals - Mr Ipock will be at work but will alert his grandson of expected delivery  Patient Self Care Activities:  . Currently UNABLE TO independently prepare  her own meals   Please see past updates related to this goal by clicking on the "Past Updates" button in the selected goal          Materials Provided: Verbal education about [facility] COVID meal program  provided by phone  Follow Up Plan: SW will follow up with patient by phone over the next week   [name], BSW, CDP [facility] / [place] Management Social Worker [phone]

## 2018-05-16 ENCOUNTER — Telehealth: Payer: Self-pay

## 2018-05-16 ENCOUNTER — Ambulatory Visit (INDEPENDENT_AMBULATORY_CARE_PROVIDER_SITE_OTHER): Payer: Medicare PPO

## 2018-05-16 DIAGNOSIS — N183 Chronic kidney disease, stage 3 unspecified: Secondary | ICD-10-CM

## 2018-05-16 DIAGNOSIS — M069 Rheumatoid arthritis, unspecified: Secondary | ICD-10-CM

## 2018-05-16 DIAGNOSIS — I130 Hypertensive heart and chronic kidney disease with heart failure and stage 1 through stage 4 chronic kidney disease, or unspecified chronic kidney disease: Secondary | ICD-10-CM | POA: Diagnosis not present

## 2018-05-16 DIAGNOSIS — I5031 Acute diastolic (congestive) heart failure: Secondary | ICD-10-CM | POA: Diagnosis not present

## 2018-05-16 DIAGNOSIS — G9341 Metabolic encephalopathy: Secondary | ICD-10-CM | POA: Diagnosis not present

## 2018-05-16 DIAGNOSIS — M47894 Other spondylosis, thoracic region: Secondary | ICD-10-CM | POA: Diagnosis not present

## 2018-05-16 DIAGNOSIS — D631 Anemia in chronic kidney disease: Secondary | ICD-10-CM | POA: Diagnosis not present

## 2018-05-16 DIAGNOSIS — J9601 Acute respiratory failure with hypoxia: Secondary | ICD-10-CM | POA: Diagnosis not present

## 2018-05-16 DIAGNOSIS — I2510 Atherosclerotic heart disease of native coronary artery without angina pectoris: Secondary | ICD-10-CM | POA: Diagnosis not present

## 2018-05-16 NOTE — Chronic Care Management (AMB) (Signed)
  Chronic Care Management   Outreach Note  05/16/2018 Name: [name] MRN: [id number] DOB: October 02, 1940  Referred by: Glendale Chard, MD Reason for referral : Chronic Care Management (INITIAL CCM RN TELEPHONE OUTREACH )   An unsuccessful telephone outreach was attempted today. I left a HIPAA compliant voice message for [name], requesting a return phone call. The patient was referred to the case management team by Glendale Chard MD for assistance with chronic disease management and care coordination.     Follow Up Plan: The CM team will reach out to the patient again over the next 5-7 days.    [name], RN,CCM Care Management Coordinator [place] [facility]  Direct Phone: [phone]

## 2018-05-17 ENCOUNTER — Ambulatory Visit: Payer: Medicare PPO | Admitting: Hematology & Oncology

## 2018-05-17 ENCOUNTER — Other Ambulatory Visit: Payer: Medicare PPO

## 2018-05-17 DIAGNOSIS — M069 Rheumatoid arthritis, unspecified: Secondary | ICD-10-CM | POA: Diagnosis not present

## 2018-05-17 DIAGNOSIS — N183 Chronic kidney disease, stage 3 (moderate): Secondary | ICD-10-CM | POA: Diagnosis not present

## 2018-05-17 DIAGNOSIS — J9601 Acute respiratory failure with hypoxia: Secondary | ICD-10-CM | POA: Diagnosis not present

## 2018-05-17 DIAGNOSIS — I2510 Atherosclerotic heart disease of native coronary artery without angina pectoris: Secondary | ICD-10-CM | POA: Diagnosis not present

## 2018-05-17 DIAGNOSIS — M47894 Other spondylosis, thoracic region: Secondary | ICD-10-CM | POA: Diagnosis not present

## 2018-05-17 DIAGNOSIS — D631 Anemia in chronic kidney disease: Secondary | ICD-10-CM | POA: Diagnosis not present

## 2018-05-17 DIAGNOSIS — G9341 Metabolic encephalopathy: Secondary | ICD-10-CM | POA: Diagnosis not present

## 2018-05-17 DIAGNOSIS — I5031 Acute diastolic (congestive) heart failure: Secondary | ICD-10-CM | POA: Diagnosis not present

## 2018-05-17 DIAGNOSIS — I130 Hypertensive heart and chronic kidney disease with heart failure and stage 1 through stage 4 chronic kidney disease, or unspecified chronic kidney disease: Secondary | ICD-10-CM | POA: Diagnosis not present

## 2018-05-20 ENCOUNTER — Other Ambulatory Visit: Payer: Medicare PPO

## 2018-05-20 ENCOUNTER — Telehealth: Payer: Self-pay

## 2018-05-20 ENCOUNTER — Ambulatory Visit: Payer: Self-pay

## 2018-05-20 ENCOUNTER — Ambulatory Visit: Payer: Medicare PPO | Admitting: Hematology & Oncology

## 2018-05-20 ENCOUNTER — Telehealth: Payer: Self-pay | Admitting: Internal Medicine

## 2018-05-20 DIAGNOSIS — M069 Rheumatoid arthritis, unspecified: Secondary | ICD-10-CM | POA: Diagnosis not present

## 2018-05-20 DIAGNOSIS — I2510 Atherosclerotic heart disease of native coronary artery without angina pectoris: Secondary | ICD-10-CM | POA: Diagnosis not present

## 2018-05-20 DIAGNOSIS — R4189 Other symptoms and signs involving cognitive functions and awareness: Secondary | ICD-10-CM

## 2018-05-20 DIAGNOSIS — J9601 Acute respiratory failure with hypoxia: Secondary | ICD-10-CM | POA: Diagnosis not present

## 2018-05-20 DIAGNOSIS — I5031 Acute diastolic (congestive) heart failure: Secondary | ICD-10-CM | POA: Diagnosis not present

## 2018-05-20 DIAGNOSIS — I130 Hypertensive heart and chronic kidney disease with heart failure and stage 1 through stage 4 chronic kidney disease, or unspecified chronic kidney disease: Secondary | ICD-10-CM | POA: Diagnosis not present

## 2018-05-20 DIAGNOSIS — N183 Chronic kidney disease, stage 3 unspecified: Secondary | ICD-10-CM

## 2018-05-20 DIAGNOSIS — I2111 ST elevation (STEMI) myocardial infarction involving right coronary artery: Secondary | ICD-10-CM

## 2018-05-20 DIAGNOSIS — D631 Anemia in chronic kidney disease: Secondary | ICD-10-CM | POA: Diagnosis not present

## 2018-05-20 DIAGNOSIS — M47894 Other spondylosis, thoracic region: Secondary | ICD-10-CM | POA: Diagnosis not present

## 2018-05-20 DIAGNOSIS — G9341 Metabolic encephalopathy: Secondary | ICD-10-CM | POA: Diagnosis not present

## 2018-05-20 NOTE — Telephone Encounter (Signed)
ATT TO CONTACT PT TO SCHEDULE VIRTUAL VISIT..CONTACTED ALL PARTIES ON PT DPR NO ANS LVM TO CALL OFC

## 2018-05-20 NOTE — Patient Instructions (Signed)
Social Worker Visit Information  Goals we discussed today:  Goals Addressed            This Visit's Progress     Patient Stated   . "I need a ramp" (pt-stated)       Current Barriers:  . Financial constraints . Lacks knowledge of community resource: to assist with ramp installation  Clinical Social Work Clinical Goal(s):  [name] Over the next 30 days, client will work with SW to address concerns related to the ability to exit her home safely - . Over the next 60 days, the patient will follow up with community agencies, as directed by SW  Interventions:  Telephonic follow up by CCM SW to the patients spouse  Advised Mr. Kandler that this CCM SW has yet to receive a response from the patients provider office regarding how to obtain necessary documentation for the patient to receive a ramp  Encouraged Mr. Mable to contact the patients provider office to discuss needed paperwork and schedule a virtual visit if needed  [place] to inquire if the patients therapy notes could be released to assist the patient in obtaining a ramp - CCM SW was instructed to fax an official request to [phone]  Collaboration with vocation rahab independent living coordinator Michel Harrow via secure e-mail correspondence requesting a fax be sent to [facility] for record request  Patient Self Care Activities:  . Currently UNABLE TO independently exit her home due to concerns with mobility. The patient relies on her spouse and grand-children for assistance   Please see past updates related to this goal by clicking on the "Past Updates" button in the selected goal          Materials Provided: No: Patient declined  Follow Up Plan: SW will follow up with patient by phone over the next 1-2 weeks   [name], [place], CDP [facility] / [place] Management Social Worker [phone]

## 2018-05-20 NOTE — Chronic Care Management (AMB) (Addendum)
  Chronic Care Management    Clinical Social Work Follow Up Note  05/20/2018 Name: ANTIA RAHAL MRN: [id number] DOB: 1940/07/11  [name] is a 78 y.o. year old female who is a primary care patient of Glendale Chard, MD. The CCM team was consulted for assistance with Community Resources and Level of Care Concerns.   Review of patient status, including review of consultants reports, other relevant assessments, and collaboration with appropriate care team members and the patient's provider was performed as part of comprehensive patient evaluation and provision of chronic care management services.      Goals Addressed            This Visit's Progress     Patient Stated   . "I need a ramp" (pt-stated)       Current Barriers:  . Financial constraints . Lacks knowledge of community resource: to assist with ramp installation  Clinical Social Work Clinical Goal(s):  [name] Over the next 30 days, client will work with SW to address concerns related to the ability to exit her home safely - . Over the next 60 days, the patient will follow up with community agencies, as directed by SW  Interventions:  Telephonic follow up by CCM SW to the patients spouse  Advised Mr. Hueber that this CCM SW has yet to receive a response from the patients provider office regarding how to obtain necessary documentation for the patient to receive a ramp  Encouraged Mr. Woloszyn to contact the patients provider office to discuss needed paperwork and schedule a virtual visit if needed  [place] to inquire if the patients therapy notes could be released to assist the patient in obtaining a ramp - CCM SW was instructed to fax an official request to [phone]  Collaboration with vocation rahab independent living coordinator Michel Harrow via secure e-mail correspondence requesting a fax be sent to [facility] for record request  Received response from Michel Harrow stating PT/OT notes would  not suffice for patient justification to receive ramp  Collaboration with CCM RN Case Manager [place] who has a future appointment with this patient. Requested patient be encouraged to set up a virtual visit for post SNF discharge follow up and to evaluate patient for wheel chair ramp needs  Patient Self Care Activities:  . Currently UNABLE TO independently exit her home due to concerns with mobility. The patient relies on her spouse and grand-children for assistance   Please see past updates related to this goal by clicking on the "Past Updates" button in the selected goal         Follow Up Plan: SW will follow up with patient by phone over the next 1-2 weeks   [name], [place], CDP [facility] / [place] Worker [phone]  Total time spent performing care coordination and/or care management activities with the patient by phone or face to face = 20 minutes.

## 2018-05-20 NOTE — Chronic Care Management (AMB) (Signed)
  Chronic Care Management   Outreach Note  05/20/2018 Name: [name] MRN: [id number] DOB: 1940/09/21  Referred by: Glendale Chard, MD Reason for referral : Chronic Care Management (#2 CCM TELEPHONE RN OUTREACH)   A second unsuccessful telephone outreach was attempted today to patient's spouse and caregiver, Mr. [name]. The patient was referred to the case management team for assistance with chronic care management and care coordination. I left a HIPAA compliant voice message with my contact number requesting a return phone call.   Follow Up Plan: Telephone follow up appointment with CCM team member scheduled for: 3-5 days  [name], [facility] Care Management Coordinator [place] Management/[facility]  Direct Phone: [phone]

## 2018-05-21 ENCOUNTER — Telehealth: Payer: Self-pay

## 2018-05-21 DIAGNOSIS — G9341 Metabolic encephalopathy: Secondary | ICD-10-CM | POA: Diagnosis not present

## 2018-05-21 DIAGNOSIS — J9601 Acute respiratory failure with hypoxia: Secondary | ICD-10-CM | POA: Diagnosis not present

## 2018-05-21 DIAGNOSIS — M47894 Other spondylosis, thoracic region: Secondary | ICD-10-CM | POA: Diagnosis not present

## 2018-05-21 DIAGNOSIS — D631 Anemia in chronic kidney disease: Secondary | ICD-10-CM | POA: Diagnosis not present

## 2018-05-21 DIAGNOSIS — M069 Rheumatoid arthritis, unspecified: Secondary | ICD-10-CM | POA: Diagnosis not present

## 2018-05-21 DIAGNOSIS — I2510 Atherosclerotic heart disease of native coronary artery without angina pectoris: Secondary | ICD-10-CM | POA: Diagnosis not present

## 2018-05-21 DIAGNOSIS — I130 Hypertensive heart and chronic kidney disease with heart failure and stage 1 through stage 4 chronic kidney disease, or unspecified chronic kidney disease: Secondary | ICD-10-CM | POA: Diagnosis not present

## 2018-05-21 DIAGNOSIS — I5031 Acute diastolic (congestive) heart failure: Secondary | ICD-10-CM | POA: Diagnosis not present

## 2018-05-21 DIAGNOSIS — N183 Chronic kidney disease, stage 3 (moderate): Secondary | ICD-10-CM | POA: Diagnosis not present

## 2018-05-21 NOTE — Telephone Encounter (Signed)
Please call to schedule pt for TCM f/u. Schedule for 30 minutes please. This week or next week is fine

## 2018-05-21 NOTE — Telephone Encounter (Signed)
[name] from [facility] called stating that the patient's blood pressure is 178/88 and she noticed that the patient missed her evening dose of her bp medication and she has not taken any of her meds this morning. She is having the patient take her meds now in hopes that her bp will come down. She called just to update you and if you have any questions we can call her back at [phone] [facility]

## 2018-05-21 NOTE — Telephone Encounter (Signed)
I have attempted to call patient twice no answer and unable to leave v/m. YRL,RMA

## 2018-05-22 ENCOUNTER — Encounter: Payer: Self-pay | Admitting: Internal Medicine

## 2018-05-22 ENCOUNTER — Other Ambulatory Visit: Payer: Self-pay

## 2018-05-22 ENCOUNTER — Ambulatory Visit (INDEPENDENT_AMBULATORY_CARE_PROVIDER_SITE_OTHER): Payer: Medicare PPO | Admitting: Internal Medicine

## 2018-05-22 VITALS — Ht 61.0 in

## 2018-05-22 DIAGNOSIS — I130 Hypertensive heart and chronic kidney disease with heart failure and stage 1 through stage 4 chronic kidney disease, or unspecified chronic kidney disease: Secondary | ICD-10-CM

## 2018-05-22 DIAGNOSIS — I5032 Chronic diastolic (congestive) heart failure: Secondary | ICD-10-CM

## 2018-05-22 DIAGNOSIS — R5381 Other malaise: Secondary | ICD-10-CM

## 2018-05-22 DIAGNOSIS — E46 Unspecified protein-calorie malnutrition: Secondary | ICD-10-CM

## 2018-05-22 DIAGNOSIS — I25119 Atherosclerotic heart disease of native coronary artery with unspecified angina pectoris: Secondary | ICD-10-CM | POA: Diagnosis not present

## 2018-05-22 DIAGNOSIS — M059 Rheumatoid arthritis with rheumatoid factor, unspecified: Secondary | ICD-10-CM

## 2018-05-23 ENCOUNTER — Telehealth: Payer: Self-pay

## 2018-05-23 ENCOUNTER — Other Ambulatory Visit: Payer: Self-pay

## 2018-05-23 ENCOUNTER — Ambulatory Visit: Payer: Self-pay

## 2018-05-23 ENCOUNTER — Ambulatory Visit (INDEPENDENT_AMBULATORY_CARE_PROVIDER_SITE_OTHER): Payer: Medicare PPO

## 2018-05-23 DIAGNOSIS — M059 Rheumatoid arthritis with rheumatoid factor, unspecified: Secondary | ICD-10-CM

## 2018-05-23 DIAGNOSIS — R5381 Other malaise: Secondary | ICD-10-CM

## 2018-05-23 DIAGNOSIS — I5032 Chronic diastolic (congestive) heart failure: Secondary | ICD-10-CM

## 2018-05-23 DIAGNOSIS — N183 Chronic kidney disease, stage 3 unspecified: Secondary | ICD-10-CM

## 2018-05-23 DIAGNOSIS — I25119 Atherosclerotic heart disease of native coronary artery with unspecified angina pectoris: Secondary | ICD-10-CM | POA: Diagnosis not present

## 2018-05-23 DIAGNOSIS — I130 Hypertensive heart and chronic kidney disease with heart failure and stage 1 through stage 4 chronic kidney disease, or unspecified chronic kidney disease: Secondary | ICD-10-CM

## 2018-05-23 DIAGNOSIS — I2510 Atherosclerotic heart disease of native coronary artery without angina pectoris: Secondary | ICD-10-CM | POA: Diagnosis not present

## 2018-05-23 NOTE — Progress Notes (Addendum)
Virtual Visit via Video   This visit type was conducted due to national recommendations for restrictions regarding the COVID-19 Pandemic (e.g. social distancing) in an effort to limit this patient's exposure and mitigate transmission in our community.  Due to her co-morbid illnesses, this patient is at least at moderate risk for complications without adequate follow up.  This format is felt to be most appropriate for this patient at this time.  All issues noted in this document were discussed and addressed.  A limited physical exam was performed with this format.    This visit type was conducted due to national recommendations for restrictions regarding the COVID-19 Pandemic (e.g. social distancing) in an effort to limit this patient's exposure and mitigate transmission in our community.  Patients identity confirmed using two different identifiers.  This format is felt to be most appropriate for this patient at this time.  All issues noted in this document were discussed and addressed.  No physical exam was performed (except for noted visual exam findings with Video Visits).    Date:  05/23/2018   ID:  [name], DOB 02-09-1940, MRN [id number]  Patient Location:  Home, accompanied by her husband  Provider location:   Office    Chief Complaint:  Blood pressure f/u, needs home equipment  History of Present Illness:    [name] is a 78 y.o. female who presents via video conferencing for a telehealth visit today.    The patient does not have symptoms concerning for COVID-19 infection (fever, chills, cough, or new shortness of breath).   She presents today for a virtual visit. She is accompanied by her husband. While this is their preferred mode of contact during this COVID-19 pandemic, it is also difficult for the patient to leave the home due to her debility. She has had a lot of medical issues occur in 2020. She was initially admitted Feb 2020 for acute MI - IWMI w/ resultant  coronary artery stenting. She was admitted in March 2020  For dehydration and failure to thrive. She was discharged to a skilled nursing facility. She was then taken to [facility] ER on 04/21/2018, from SNF, for further evaluation of hypoxia and change in mental status. She was found to have acute pulmonary edema, diastolic heart failure and required intubation. She was extubated on 4/7.  Due to the current state of her health, she requires SNF care. However, due to finances, the family chose to bring her home. Unfortunately, they currently lack the equipment needed to care for her properly.   Hypertension  This is a chronic problem. The current episode started more than 1 year ago. Associated symptoms include malaise/fatigue. Risk factors for coronary artery disease include sedentary lifestyle, smoking/tobacco exposure, stress and post-menopausal state.     Past Medical History:  Diagnosis Date  . Acute kidney injury superimposed on chronic kidney disease ([place]) 02/06/2015  . Anxiety   . CAD (coronary artery disease) 02/28/2018   s/p stent  . Depression   . DVT (deep venous thrombosis) (HCC) 1970s   LLE  . Heart murmur   . Hypercholesterolemia   . Hypertension   . Kidney stones   . Migraine    "a few/year" (07/16/2015)  . Multiple falls   . Pneumonia "several times"  . Rheumatoid arthritis(714.0)    "all over" (07/16/2015)  . Seizure [facility])    "last one was 1//2017" (07/16/2015)  . Syncope and collapse   . Vision abnormalities    Past Surgical  History:  Procedure Laterality Date  . ABDOMINAL HYSTERECTOMY    . CARPAL TUNNEL RELEASE Bilateral   . CATARACT EXTRACTION W/ INTRAOCULAR LENS  IMPLANT, BILATERAL Bilateral   . CORONARY/GRAFT ACUTE MI REVASCULARIZATION N/A 02/28/2018   Procedure: Coronary/Graft Acute MI Revascularization;  Surgeon: Jettie Booze, MD;  Location: [place] CV LAB;  Service: Cardiovascular;  Laterality: N/A;  . ESOPHAGOGASTRODUODENOSCOPY N/A 03/05/2018   Procedure:  ESOPHAGOGASTRODUODENOSCOPY (EGD);  Surgeon: Ladene Artist, MD;  Location: [facility] ENDOSCOPY;  Service: Endoscopy;  Laterality: N/A;  . LEFT HEART CATH AND CORONARY ANGIOGRAPHY N/A 02/28/2018   Procedure: LEFT HEART CATH AND CORONARY ANGIOGRAPHY;  Surgeon: Jettie Booze, MD;  Location: [place] CV LAB;  Service: Cardiovascular;  Laterality: N/A;  . NECK EXPLORATION  07/16/2015  . PARATHYROIDECTOMY Right 07/16/2015   superior  . PARATHYROIDECTOMY Right 07/16/2015   Procedure: RIGHT INFERIOR PARATHYROIDECTOMY;  Surgeon: Armandina Gemma, MD;  Location: [place];  Service: General;  Laterality: Right;  . SUBMUCOSAL INJECTION  03/05/2018   Procedure: SUBMUCOSAL INJECTION;  Surgeon: Ladene Artist, MD;  Location: [facility] ENDOSCOPY;  Service: Endoscopy;;  . TOENAIL AVULSION Bilateral    great toe     Current Meds  Medication Sig  . acetaminophen (TYLENOL) 325 MG tablet Take 2 tablets (650 mg total) by mouth every 6 (six) hours as needed for mild pain (or Fever >/= 101).  [name] atorvastatin (LIPITOR) 80 MG tablet Take 1 tablet (80 mg total) by mouth daily at 6 PM.  . carvedilol (COREG) 12.5 MG tablet Take 2 tablets (25 mg total) by mouth 2 (two) times daily with a meal.  . furosemide (LASIX) 40 MG tablet Take 1 tablet (40 mg total) by mouth daily.  . hydroxychloroquine (PLAQUENIL) 200 MG tablet Take 200 mg by mouth daily.   . iron polysaccharides (NIFEREX) 150 MG capsule Take 1 capsule (150 mg total) by mouth daily.  [name] lactose free nutrition (BOOST PLUS) LIQD Take 237 mLs by mouth 3 (three) times daily between meals.  . levETIRAcetam (KEPPRA) 500 MG tablet TAKE 2 TABLETS BY MOUTH EVERY DAY (Patient taking differently: Take 1,000 mg by mouth daily. )  . mirtazapine (REMERON) 15 MG tablet Take 15 mg by mouth at bedtime.  . nitroGLYCERIN (NITROSTAT) 0.4 MG SL tablet Place 1 tablet (0.4 mg total) under the tongue every 5 (five) minutes as needed. (Patient taking differently: Place 0.4 mg under the tongue every 5  (five) minutes as needed for chest pain. )  . ondansetron (ZOFRAN) 4 MG tablet Take 1 tablet (4 mg total) by mouth every 6 (six) hours as needed for nausea.  . pantoprazole (PROTONIX) 40 MG tablet Take 1 tablet (40 mg total) by mouth daily.  . predniSONE (DELTASONE) 5 MG tablet Take 1 tablet (5 mg total) by mouth daily with breakfast.  . ticagrelor (BRILINTA) 90 MG TABS tablet Take 1 tablet (90 mg total) by mouth 2 (two) times daily.  [name] UNABLE TO FIND Take 120 mLs by mouth 3 (three) times daily. Med Name: Med Plus 2.0  . Vitamin D, Ergocalciferol, (DRISDOL) 50000 units CAPS capsule Take 1 capsule (50,000 Units total) by mouth every 7 (seven) days.     Allergies:   Patient has no known allergies.   Social History   Tobacco Use  . Smoking status: Former Smoker    Packs/day: 1.00    Years: 62.00    Pack years: 62.00    Types: Cigarettes    Last attempt to quit: 01/2018  Years since quitting: 0.3  . Smokeless tobacco: Never Used  Substance Use Topics  . Alcohol use: No  . Drug use: No     Family Hx: The patient's family history includes Cancer in her brother and father; Heart disease in her brother and mother; Miscarriages / Stillbirths in her brother; Pneumonia in her mother; Prostate cancer in her father.  ROS:   Please see the history of present illness.    Review of Systems  Constitutional: Positive for malaise/fatigue.  Respiratory: Negative.   Cardiovascular: Negative.   Gastrointestinal: Negative.   Neurological: Negative.   Psychiatric/Behavioral: Negative.     All other systems reviewed and are negative.   Labs/Other Tests and Data Reviewed:    Recent Labs: 04/01/2018: TSH 0.571 04/22/2018: ALT 22; B Natriuretic Peptide 66.7 04/23/2018: Magnesium 2.1 04/24/2018: Hemoglobin 8.3; Platelets 287 04/26/2018: BUN 40; Creatinine, Ser 1.54; Potassium 4.8; Sodium 142   Recent Lipid Panel Lab Results  Component Value Date/Time   CHOL 168 02/28/2018 05:38 AM   TRIG 42  04/21/2018 07:26 PM   HDL 42 02/28/2018 05:38 AM   CHOLHDL 4.0 02/28/2018 05:38 AM   LDLCALC 120 (H) 02/28/2018 05:38 AM    Wt Readings from Last 3 Encounters:  04/26/18 106 lb 0.7 oz (48.1 kg)  03/29/18 99 lb 6.8 oz (45.1 kg)  03/08/18 108 lb 3.9 oz (49.1 kg)     Exam:    Vital Signs:  Ht 5\' 1"  (1.549 m)   BMI 20.04 kg/m     Physical Exam  Constitutional:  Lying in bed, responds slowly to questions. Does not appear to have any respiratory distress.   HENT:  Head: Normocephalic and atraumatic.  Pulmonary/Chest: Effort normal.  Nursing note and vitals reviewed.   ASSESSMENT & PLAN:     1. Hypertensive heart and renal disease with renal failure, stage 1 through stage 4 or unspecified chronic kidney disease, with heart failure ([place])  Her most recent discharge summary was reviewed in full detail. Importance of medication compliance was discussed with the patient and her husband. She will continue with [facility] home health.   2. Coronary artery disease involving native coronary artery of native heart with angina pectoris (HCC)  S/P RCA stent. Appears to be stable.   3. Chronic diastolic heart failure (HCC)  Chronic, will have home health nurse do some f/u bloodwork. Family denies LE edema.   4. Rheumatoid arthritis with positive rheumatoid factor, involving unspecified site (HCC)  Chronic. She has chronic pain from this condition. She would benefit from hospital bed because this would allow positioning of the body not feasible with ordinary bed, which may help in pain control.   6. Physical debility  She will likely need hospital bed with gel mattress - to prevent bedsores. She requires gel overlay because she has limited mobility along with impaired nutrition status. She will also benefit from hoyer lift and trapeze bar. Will contact [facility] to see if they are able to get this for her. Due to her frailty, wheelchair ramp for the home would be helpful to her husband as well. He  is having quite a bit of difficulty transporting her. She would be better suited in a SNF; however, this is not currently possible due to finances. Additionally, she requires a transport chair due to weakness that has resulted from her recent hospitalization. She is unable to complete ADLs on her own. She is currently too weak to self-propel a wheelchair and requires a transport chair.  COVID-19 Education: The signs and symptoms of COVID-19 were discussed with the patient and how to seek care for testing (follow up with PCP or arrange E-visit).  The importance of social distancing was discussed today.  Patient Risk:   After full review of this patients clinical status, I feel that they are at least moderate risk at this time.  Time:   Today, I have spent 15 minutes/ seconds with the patient with telehealth technology discussing above diagnoses.     Medication Adjustments/Labs and Tests Ordered: Current medicines are reviewed at length with the patient today.  Concerns regarding medicines are outlined above.   Tests Ordered: No orders of the defined types were placed in this encounter.   Medication Changes: No orders of the defined types were placed in this encounter.   Disposition:  Follow up in 3 month(s)  Signed, Maximino Greenland, MD

## 2018-05-23 NOTE — Patient Instructions (Signed)
Social Worker Visit Information  Goals we discussed today:  Goals Addressed            This Visit's Progress     Patient Stated   . "I need a ramp" (pt-stated)   On track    Current Barriers:  . Financial constraints . Lacks knowledge of community resource: to assist with ramp installation  Clinical Social Work Clinical Goal(s):  [name] Over the next 30 days, client will work with SW to address concerns related to the ability to exit her home safely - . Over the next 60 days, the patient will follow up with community agencies, as directed by SW  Interventions:  Collaboration with primary care provider regarding information needed by [facility]ational [facility]  Communication with Michel Harrow, independent living coordinator with vocational rehab updating her information has been sent  Patient Self Care Activities:  . Currently UNABLE TO independently exit her home due to concerns with mobility. The patient relies on her spouse and grand-children for assistance   Please see past updates related to this goal by clicking on the "Past Updates" button in the selected goal          Materials Provided: No. Patient not reached.  Follow Up Plan: SW will follow up with patient by phone over the next 2-3 weeks   [name], BSW, CDP [facility] / [place] Management Social Worker [phone]

## 2018-05-23 NOTE — Chronic Care Management (AMB) (Signed)
  Chronic Care Management    Clinical Social Work Follow Up Note  05/23/2018 Name: [name] MRN: [id number] DOB: 1940/09/07  [name] is a 78 y.o. year old female who is a primary care patient of Becky Chard, MD. The CCM team was consulted for assistance with Community Resources and Level of Care Concerns.   Review of patient status, including review of consultants reports, other relevant assessments, and collaboration with appropriate care team members and the patient's provider was performed as part of comprehensive patient evaluation and provision of chronic care management services.     Goals Addressed            This Visit's Progress     Patient Stated   . "I need a ramp" (pt-stated)   On track    Current Barriers:  . Financial constraints . Lacks knowledge of community resource: to assist with ramp installation  Clinical Social Work Clinical Goal(s):  [name] Over the next 30 days, client will work with SW to address concerns related to the ability to exit her home safely - . Over the next 60 days, the patient will follow up with community agencies, as directed by SW  Interventions:  Collaboration with primary care provider regarding information needed by Vocational Rehab  Communication with [name], independent living coordinator with vocational rehab updating her information has been sent  Patient Self Care Activities:  . Currently UNABLE TO independently exit her home due to concerns with mobility. The patient relies on her spouse and grand-children for assistance   Please see past updates related to this goal by clicking on the "Past Updates" button in the selected goal          Follow Up Plan: SW will follow up with patient by phone over the next 2-3 weeks   [name], BSW, CDP [facility] / [place] Worker [phone]  Total time spent performing care coordination and/or care management activities with the patient by phone or  face to face = 15 minutes.

## 2018-05-24 ENCOUNTER — Telehealth: Payer: Self-pay

## 2018-05-24 ENCOUNTER — Ambulatory Visit: Payer: Self-pay

## 2018-05-24 DIAGNOSIS — I2510 Atherosclerotic heart disease of native coronary artery without angina pectoris: Secondary | ICD-10-CM | POA: Diagnosis not present

## 2018-05-24 DIAGNOSIS — E785 Hyperlipidemia, unspecified: Secondary | ICD-10-CM | POA: Diagnosis not present

## 2018-05-24 DIAGNOSIS — I130 Hypertensive heart and chronic kidney disease with heart failure and stage 1 through stage 4 chronic kidney disease, or unspecified chronic kidney disease: Secondary | ICD-10-CM

## 2018-05-24 DIAGNOSIS — N183 Chronic kidney disease, stage 3 unspecified: Secondary | ICD-10-CM

## 2018-05-24 DIAGNOSIS — M170 Bilateral primary osteoarthritis of knee: Secondary | ICD-10-CM | POA: Diagnosis not present

## 2018-05-24 DIAGNOSIS — M109 Gout, unspecified: Secondary | ICD-10-CM | POA: Diagnosis not present

## 2018-05-24 DIAGNOSIS — M059 Rheumatoid arthritis with rheumatoid factor, unspecified: Secondary | ICD-10-CM

## 2018-05-24 DIAGNOSIS — I5032 Chronic diastolic (congestive) heart failure: Secondary | ICD-10-CM

## 2018-05-24 DIAGNOSIS — I25119 Atherosclerotic heart disease of native coronary artery with unspecified angina pectoris: Secondary | ICD-10-CM

## 2018-05-24 DIAGNOSIS — M069 Rheumatoid arthritis, unspecified: Secondary | ICD-10-CM | POA: Diagnosis not present

## 2018-05-24 DIAGNOSIS — R5381 Other malaise: Secondary | ICD-10-CM

## 2018-05-24 DIAGNOSIS — F331 Major depressive disorder, recurrent, moderate: Secondary | ICD-10-CM | POA: Diagnosis not present

## 2018-05-24 DIAGNOSIS — I252 Old myocardial infarction: Secondary | ICD-10-CM | POA: Diagnosis not present

## 2018-05-24 DIAGNOSIS — J9601 Acute respiratory failure with hypoxia: Secondary | ICD-10-CM | POA: Diagnosis not present

## 2018-05-24 DIAGNOSIS — D631 Anemia in chronic kidney disease: Secondary | ICD-10-CM | POA: Diagnosis not present

## 2018-05-24 DIAGNOSIS — G9341 Metabolic encephalopathy: Secondary | ICD-10-CM | POA: Diagnosis not present

## 2018-05-24 DIAGNOSIS — K219 Gastro-esophageal reflux disease without esophagitis: Secondary | ICD-10-CM | POA: Diagnosis not present

## 2018-05-24 DIAGNOSIS — I5031 Acute diastolic (congestive) heart failure: Secondary | ICD-10-CM | POA: Diagnosis not present

## 2018-05-24 DIAGNOSIS — I129 Hypertensive chronic kidney disease with stage 1 through stage 4 chronic kidney disease, or unspecified chronic kidney disease: Secondary | ICD-10-CM | POA: Diagnosis not present

## 2018-05-24 DIAGNOSIS — G40909 Epilepsy, unspecified, not intractable, without status epilepticus: Secondary | ICD-10-CM | POA: Diagnosis not present

## 2018-05-24 DIAGNOSIS — M47894 Other spondylosis, thoracic region: Secondary | ICD-10-CM | POA: Diagnosis not present

## 2018-05-24 NOTE — Patient Instructions (Signed)
Social Worker Visit Information  Goals we discussed today:  Goals Addressed            This Visit's Progress     Patient Stated   . "I need a ramp" (pt-stated)   On track    Current Barriers:  . Financial constraints . Lacks knowledge of community resource: to assist with ramp installation  Clinical Social Work Clinical Goal(s):  [name] Over the next 30 days, client will work with SW to address concerns related to the ability to exit her home safely - . Over the next 60 days, the patient will follow up with community agencies, as directed by SW  Interventions:  Received confirmation from Michel Harrow of receipt of medical documents via e-mail:  "I received the medical records. Thank you for your help. [place] will proceed with [place] today, and send a  referral to the rehab engineer to assess the property according to his schedule. He will inform of the date  after he schedules with the client.  Thank you so much!"   Patient Self Care Activities:  . Currently UNABLE TO independently exit her home due to concerns with mobility. The patient relies on her spouse and grand-children for assistance   Please see past updates related to this goal by clicking on the "Past Updates" button in the selected goal        Other   . "I need help caring for my wife"       Spouse states  Current Barriers:  [name] Knowledge Deficits related to potential complications secondary to immobility  . Lacks caregiver support  . Chronic Disease Management support and education needs related to how to provide total care, obtain DME and utilize the appropriate resources available  Nurse Case Manager Clinical Goal(s):  [name] Over the next 30 days, patient/spouse will verbalize understanding of plan for obtaining DME.  . Over the next 60 days, patient/spouse will verbalize understanding of Palliative Care Services.  . Over the next 90 days, patient will work with the CCM team to improve knowledge and understanding  about potential complications associated with patient's impaired physical mobility including complications related to skin breakdown, and pneumonia, malnutrition and DVT.   Interventions:   Inbound call received from [place] Worker [phone])  Discussed patient condition and [facility] concerns over patient caregiver availability  Provided [name] with goals CCM team is currently working on with the patient including mobile meals, obtaining a wheel chair ramp, obtaining needed DME, and a Palliative Care Consult  Determined the patients grandson is not as involved with the patients care as needed while the patients spouse is working . Collaboration with CCM RN Tourist information centre manager  . Collaboration with [place] [facility] assistance director Bary Castilla regarding patient situation and to determine if other options may be available for this patient  Patient Self Care Activities:   Mr. lane kjos understanding of the education/information provided today  . Currently UNABLE TO independently participate in [place]  Please see past updates related to this goal by clicking on the "Past Updates" button in the selected goal           Materials Provided: No. Patient not reached.  Follow Up Plan: SW will follow up with patient by phone over the next 3 days   [name], [place], CDP [facility] / [place] Management Social Worker [phone]

## 2018-05-24 NOTE — Patient Instructions (Signed)
Visit Information  Goals Addressed    . "I need help caring for my wife"       Spouse states  Current Barriers:  [name] Knowledge Deficits related to potential complications secondary to immobility  . Lacks caregiver support  . Chronic Disease Management support and education needs related to how to provide total care, obtain DME and utilize the appropriate resources available  Nurse Case Manager Clinical Goal(s):  [name] Over the next 30 days, patient/spouse will verbalize understanding of plan for obtaining DME.  . Over the next 60 days, patient/spouse will verbalize understanding of Palliative Care Services.  . Over the next 90 days, patient will work with the CCM team to improve knowledge and understanding about potential complications associated with patient's impaired physical mobility including complications related to skin breakdown, and pneumonia, malnutrition and DVT.   Interventions:   Completed Initial CCM RN telephone outreach with husband Arianna Haydon to establish plan of care . Evaluation of current treatment plan related to plan of care established by provider and spouse's ability to care for patient at home  . Assessed for current caregiver status; spouse is caring for patient after his work hours; has 56 yr old grandson who is caring for the patient during the daytime hours . Assessed for Medicaid application status; pt has not received a response from Medicaid as of yet . Provided education to Mr. Closson re: potential complications related to patient's immobility, including risk for skin breakdown, DVT, pneumonia, muscle atrophy and or contractures . Collaborated with [name] and Dr. Baird Cancer regarding discussion with spouse about DME needs, including a semi-electric hospital bed with trapeze bar and potentially a hoyer lift . Discussed plans with patient for ongoing care management follow up and provided patient with direct contact information for care management team    . Discussed DME availability that may help spouse lift and transfer patient including a semi-electric hospital bed with trapeze bar and potentially a hoyer lift-Mr. Heinzel is agreeable to DME however he would like to discuss with Mrs. Klippel before proceeding-Mr. Darnell will call me back to confirm-Dr. Baird Cancer aware . Placed an outbound call to [facility] [phone], left a message for return call from assigned PT Tomasita Crumble for collaboration re: patient's progress with in home therapy . Provided RN CM contact # and discussed hours of nurse availability . Scheduled a CCM RN telephone follow up call with spouse  Patient Self Care Activities:   Spouse verbalizes understanding of the education/information provided today  . Currently UNABLE TO independently participate in [place]  Initial goal documentation       The patient verbalized understanding of instructions provided today and declined a print copy of patient instruction materials.   Telephone follow up appointment with CCM team member scheduled for: 05/24/18  [name], [facility] Care Management Coordinator [place] Management/[facility]  Direct Phone: [phone]

## 2018-05-24 NOTE — Chronic Care Management (AMB) (Signed)
Chronic Care Adams   Follow Up Note   05/24/2018 Name: [name] MRN: [id number] DOB: 1940/08/22  Referred by: Becky Chard, MD Reason for referral : Chronic Care Adams (CCM RN Telephone Collaboration with [facility] )   [name] is a 78 y.o. year old female who is a primary care patient of Becky Chard, MD. The CCM team was consulted for assistance with chronic disease Adams and care coordination needs.    Review of patient status, including review of consultants reports, relevant laboratory and other test results, and collaboration with appropriate care team members and the patient's provider was performed as part of comprehensive patient evaluation and provision of chronic care Adams services.    I received an inbound call from [place] RN with a patient update.   Goals Addressed     "I need help caring for my wife"       Spouse states  Current Barriers:   Knowledge Deficits related to potential complications secondary to immobility   Lacks caregiver support   Chronic Disease Adams support and education needs related to how to provide total care, obtain DME and utilize the appropriate resources available  Nurse Case Manager Clinical Goal(s):   Over the next 30 days, patient/spouse will verbalize understanding of plan for obtaining DME.   Over the next 60 days, patient/spouse will verbalize understanding of Palliative Care Services.   Over the next 90 days, patient will work with the CCM team to improve knowledge and understanding about potential complications associated with patient's impaired physical mobility including complications related to skin breakdown, and pneumonia, malnutrition and DVT.   Interventions:   Inbound call received from [name] [phone])  Discussed patient condition and [name] concerns over patient caregiver availability  Provided [name] with goals  CCM team is currently working on with the patient including mobile meals, obtaining a wheel chair ramp, obtaining needed DME, and a Palliative Care Consult  Determined the patients grandson is not as involved with the patients care as needed while the patients spouse is working  Ambulance person with CCM RN Case Audiological scientist with [name] assistance director [name] regarding patient situation and to determine if other options may be available for this patient  Inbound call received from [place] RN with a patient update  Discussed concerns of patient neglect and missed medications  Discussed patient was left home alone in the previous week and was found to have been laying in urine and stool  Discussed patient has a pill box her daughter is filling but the patient is left to self administer her medications but is unable, therefor she is not taking her scheduled am meds  Discussed a [name] SW is scheduled to make a home visit with patient and husband for further evaluation   Discussed current DME orders hospital bed with trapeze and education provided to [name] today  Discussed [name] has agreed to a Palliative Care referral and Dr. Baird Adams has been notified  Collaboration with [name] BSW regarding information provided today by [name]  Scheduled a CCM RN follow up call with [name] for Monday, 05/27/18  Patient Self Care Activities:   [name] understanding of the education/information provided today   Currently UNABLE TO independently participate in Self Care  Please see past updates related to this goal by clicking on the "Past Updates" button in the selected goal  Telephone follow up appointment with CCM team member scheduled for: 05/27/18   [name], [facility] Care Adams Coordinator [place] Adams/[facility]  Direct Phone: [phone]

## 2018-05-24 NOTE — Chronic Care Management (AMB) (Signed)
Chronic Care Management   Follow Up Note   05/24/2018 Name: [name] MRN: [id number] DOB: 24-Jun-1940  Referred by: [place] Chard, MD Reason for referral : Chronic Care Management (CCM RN Telephone Collaboration )   [name] is a 78 y.o. year old female who is a primary care patient of Glendale Chard, MD. The CCM team was consulted for assistance with chronic disease management and care coordination needs.    Review of patient status, including review of consultants reports, relevant laboratory and other test results, and collaboration with appropriate care team members and the patient's provider was performed as part of comprehensive patient evaluation and provision of chronic care management services.   [name] returned my call from earlier today.    Goals Addressed    . "I need help caring for my wife"       Spouse states  Current Barriers:  [name] Knowledge Deficits related to potential complications secondary to immobility  . Lacks caregiver support  . Chronic Disease Management support and education needs related to how to provide total care, obtain DME and utilize the appropriate resources available  Nurse Case Manager Clinical Goal(s):  [name] Over the next 30 days, patient/spouse will verbalize understanding of plan for obtaining DME.  . Over the next 60 days, patient/spouse will verbalize understanding of Palliative Care Services.  . Over the next 90 days, patient will work with the CCM team to improve knowledge and understanding about potential complications associated with patient's impaired physical mobility including complications related to skin breakdown, and pneumonia, malnutrition and DVT.   Interventions:   Inbound call received from [name]  Discussed DME consideration; [name] has approved the DME and would like to proceed  Discussed the process involved and encouraged him to answer any/all calls from [facility] and or DME supplier  Discussed my  outreach attempt to collaborate with assigned PT-[name] reports the PTA visit today  Verbal education given about the importance of skin breakdown prevention (granddaughter is assisting with bathing)   Instructed that patient should be out of bed when able, instructed patient should be turned, weight shifted, and repositioned using pillows for support as needed, especially over bony prominences every 2 hours to help skin breakdown  Instructed on importance of inspecting skin daily for early signs of skin breakdown such as red or pink tinged areas and or other discoloration of skin and should be reported to nurse, CCM team and or provider as soon as discovered  Instructed that patient needs to cough and deep breath every hour to help avoid respiratory infection  Verbal education provided to [name] regarding Palliative Care Services-[name] verbalizes understanding and is agreeable to having a referral sent-instructed on process and encouraged [name] to expect a call to schedule a home visit  In basket message sent to Dr. Baird Cancer requesting the Palliative Care referral   Secure message sent to [place] and [place]ansisco Beau, requesting the orders for DME be faxed today if possible and to notify me once completed  Placed outbound call to [facility] [phone], spoke with CSR Deneise Lever, left a message for the entire health care team working with this patient, to return my call, provided my contact # and hours of availability  . Scheduled a CCM RN telephone follow up call with spouse for next week  Patient Self Care Activities:   [name] understanding of the education/information provided today  . Currently UNABLE TO independently participate in Self  Care  Please see past updates related to this goal by clicking on the "Past Updates" button in the selected goal         The CCM team will reach out to the patient again over the next 5-7 days.    [name], RN,CCM Care Management Coordinator [place] Management/[facility]  Direct Phone: [phone]

## 2018-05-24 NOTE — Patient Instructions (Signed)
Visit Information  Goals Addressed    . "I need help caring for my wife"       Spouse states  Current Barriers:  [name] Knowledge Deficits related to potential complications secondary to immobility  . Lacks caregiver support  . Chronic Disease Management support and education needs related to how to provide total care, obtain DME and utilize the appropriate resources available  Nurse Case Manager Clinical Goal(s):  [name] Over the next 30 days, patient/spouse will verbalize understanding of plan for obtaining DME.  . Over the next 60 days, patient/spouse will verbalize understanding of Palliative Care Services.  . Over the next 90 days, patient will work with the CCM team to improve knowledge and understanding about potential complications associated with patient's impaired physical mobility including complications related to skin breakdown, and pneumonia, malnutrition and DVT.   Interventions:   Inbound call received from [name]  Discussed DME consideration; [name] has approved the DME and would like to proceed  Discussed the process involved and encouraged him to answer any/all calls from [facility] and or DME supplier  Discussed my outreach attempt to collaborate with assigned PT-[name] reports the PTA visit today  Verbal education given about the importance of skin breakdown prevention (granddaughter is assisting with bathing)   Instructed that patient should be out of bed when able, instructed patient should be turned, weight shifted, and repositioned using pillows for support as needed, especially over bony prominences every 2 hours to help skin breakdown  Instructed on importance of inspecting skin daily for early signs of skin breakdown such as red or pink tinged areas and or other discoloration of skin and should be reported to nurse, CCM team and or provider as soon as discovered  Instructed that patient needs to cough and deep breath every hour to help avoid respiratory  infection  Verbal education provided to [name] regarding Palliative Care Services-[name] verbalizes understanding and is agreeable to having a referral sent-instructed on process and encouraged [name] to expect a call to schedule a home visit  In basket message sent to Dr. Baird Cancer requesting the Palliative Care referral   Secure message sent to [place] and [place] Beau, requesting the orders for DME be faxed today if possible and to notify me once completed  Placed outbound call to [facility] [phone], spoke with CSR Deneise Lever, left a message for the entire health care team working with this patient, to return my call, provided my contact # and hours of availability  . Scheduled a CCM RN telephone follow up call with spouse for next week  Patient Self Care Activities:   [name] understanding of the education/information provided today  . Currently UNABLE TO independently participate in [place]  Please see past updates related to this goal by clicking on the "Past Updates" button in the selected goal         The patient verbalized understanding of instructions provided today and declined a print copy of patient instruction materials.   The CM team will reach out to the patient again over the next 5-7 days.   Becky Merino, RN,CCM Care Management Coordinator [place] Management/[facility]  Direct Phone: [phone]

## 2018-05-24 NOTE — Chronic Care Management (AMB) (Signed)
Chronic Care Management    Clinical Social Work Follow Up Note  05/24/2018 Name: [name] MRN: [id number] DOB: [birth year]  [name] is a 78 y.o. year old female who is a primary care patient of Glendale Chard, MD. The CCM team was consulted for assistance with Community Resources and Level of Care Concerns.   Review of patient status, including review of consultants reports, other relevant assessments, and collaboration with appropriate care team members and the patient's provider was performed as part of comprehensive patient evaluation and provision of chronic care management services.     Goals Addressed            This Visit's Progress     Patient Stated   . "I need a ramp" (pt-stated)   On track    Current Barriers:  . Financial constraints . Lacks knowledge of community resource: to assist with ramp installation  Clinical Social Work Clinical Goal(s):  [name] Over the next 30 days, client will work with SW to address concerns related to the ability to exit her home safely - . Over the next 60 days, the patient will follow up with community agencies, as directed by SW  Interventions:  Received confirmation from Michel Harrow of receipt of medical documents via e-mail:  "I received the medical records. Thank you for your help. [place] will proceed with [place] today, and send a  referral to the rehab engineer to assess the property according to his schedule. He will inform of the date  after he schedules with the client.  Thank you so much!"   Patient Self Care Activities:  . Currently UNABLE TO independently exit her home due to concerns with mobility. The patient relies on her spouse and grand-children for assistance   Please see past updates related to this goal by clicking on the "Past Updates" button in the selected goal        Other   . "I need help caring for my wife"       Spouse states  Current Barriers:  [name] Knowledge Deficits related to potential  complications secondary to immobility  . Lacks caregiver support  . Chronic Disease Management support and education needs related to how to provide total care, obtain DME and utilize the appropriate resources available  Nurse Case Manager Clinical Goal(s):  [name] Over the next 30 days, patient/spouse will verbalize understanding of plan for obtaining DME.  . Over the next 60 days, patient/spouse will verbalize understanding of Palliative Care Services.  . Over the next 90 days, patient will work with the CCM team to improve knowledge and understanding about potential complications associated with patient's impaired physical mobility including complications related to skin breakdown, and pneumonia, malnutrition and DVT.   Interventions:   Inbound call received from [place] Worker [phone])  Discussed patient condition and [facility] concerns over patient caregiver availability  Provided [name] with goals CCM team is currently working on with the patient including mobile meals, obtaining a wheel chair ramp, obtaining needed DME, and a Palliative Care Consult  Determined the patients grandson is not as involved with the patients care as needed while the patients spouse is working . Collaboration with CCM RN Tourist information centre manager  . Collaboration with [place] Management assistance director Bary Castilla regarding patient situation and to determine if other options may be available for this patient  Patient Self Care Activities:   Mr. cielo arias understanding of the education/information provided today  .  Currently UNABLE TO independently participate in [place]  Please see past updates related to this goal by clicking on the "Past Updates" button in the selected goal           Follow Up Plan: SW will follow up with patient by phone over the next 3 days   [name], [place], CDP [facility] / [facility] Care Management Social Worker [phone]  Total  time spent performing care coordination and/or care management activities with the patient by phone or face to face = 35 minutes.

## 2018-05-24 NOTE — Chronic Care Management (AMB) (Signed)
  Chronic Care Management   Outreach Note  05/24/2018 Name: [name] MRN: [id number] DOB: 08-30-1940  Referred by: Glendale Chard, MD Reason for referral : Chronic Care Management (CCM RN Telephone Follow Up )  An unsuccessful telephone outreach was attempted today. The patient was referred to the case management team by Glendale Chard MD for assistance with chronic care management and care coordination. I left a HIPAA compliant voice message for [name], requesting a return phone call.    Follow Up Plan: The CCM team will reach out to the patient again over the next 3-5 days.     [name], RN,CCM Care Management Coordinator [place] Management/[facility]  Direct Phone: [phone]

## 2018-05-24 NOTE — Chronic Care Management (AMB) (Signed)
Chronic Care Management   Initial Visit Note  05/23/2018 Name: [name] MRN: [id number] DOB: [birth year]  Referred by: Glendale Chard, MD Reason for referral : Chronic Care Management (CCM RN Initial Telephone Follow Up )   [name] is a 78 y.o. year old female who is a primary care patient of Glendale Chard, MD. The CCM team was consulted for assistance with chronic disease management and care coordination needs.   Review of patient status, including review of consultants reports, relevant laboratory and other test results, and collaboration with appropriate care team members and the patient's provider was performed as part of comprehensive patient evaluation and provision of chronic care management services.    I spoke with Mr. Bergstresser by telephone today to assess for CCM needs and establish a plan of care for the patient.   Objective:  Lab Results  Component Value Date   HGBA1C 5.2 02/28/2018   HGBA1C 5.6 08/11/2016   HGBA1C 5.5 02/06/2015   Lab Results  Component Value Date   LDLCALC 120 (H) 02/28/2018   CREATININE 1.54 (H) 04/26/2018   BP Readings from Last 3 Encounters:  04/26/18 (!) 148/80  04/03/18 (!) 141/76  03/08/18 124/69    Goals Addressed    . "I need help caring for my wife"       Spouse states  Current Barriers:  [name] Knowledge Deficits related to potential complications secondary to immobility  . Lacks caregiver support  . Chronic Disease Management support and education needs related to how to provide total care, obtain DME and utilize the appropriate resources available  Nurse Case Manager Clinical Goal(s):  [name] Over the next 30 days, patient/spouse will verbalize understanding of plan for obtaining DME.  . Over the next 60 days, patient/spouse will verbalize understanding of Palliative Care Services.  . Over the next 90 days, patient will work with the CCM team to improve knowledge and understanding about potential complications associated with  patient's impaired physical mobility including complications related to skin breakdown, and pneumonia, malnutrition and DVT.   Interventions:   Completed Initial CCM RN telephone outreach with husband [name] to establish plan of care . Evaluation of current treatment plan related to plan of care established by provider and spouse's ability to care for patient at home  . Assessed for current caregiver status; spouse is caring for patient after his work hours; has 56 yr old grandson who is caring for the patient during the daytime hours . Assessed for Medicaid application status; pt has not received a response from Medicaid as of yet . Provided education to Mr. Beam re: potential complications related to patient's immobility, including risk for skin breakdown, DVT, pneumonia, muscle atrophy and or contractures . Collaborated with [name] and Dr. Baird Cancer regarding discussion with spouse about DME needs, including a semi-electric hospital bed with trapeze bar and potentially a hoyer lift . Discussed plans with patient for ongoing care management follow up and provided patient with direct contact information for care management team   . Discussed DME availability that may help spouse lift and transfer patient including a semi-electric hospital bed with trapeze bar and potentially a hoyer lift-Mr. Sheffer is agreeable to DME however he would like to discuss with Mrs. Swiatek before proceeding-Mr. Doan will call me back to confirm-Dr. Baird Cancer aware . Placed an outbound call to [facility] [phone], left a message for return call from assigned PT Tomasita Crumble for collaboration re: patient's progress with in home therapy .  Provided RN CM contact # and discussed hours of nurse availability . Scheduled a CCM RN telephone follow up call with spouse  Patient Self Care Activities:   Spouse verbalizes understanding of the education/information provided today  . Currently UNABLE TO  independently participate in [place]  Initial goal documentation        Telephone follow up appointment with CCM team member scheduled for: 05/24/18 [name], [facility] Care Management Coordinator [place] [facility]  Direct Phone: [phone]

## 2018-05-27 ENCOUNTER — Ambulatory Visit: Payer: Self-pay

## 2018-05-27 ENCOUNTER — Telehealth: Payer: Self-pay

## 2018-05-27 ENCOUNTER — Other Ambulatory Visit: Payer: Self-pay

## 2018-05-27 DIAGNOSIS — M059 Rheumatoid arthritis with rheumatoid factor, unspecified: Secondary | ICD-10-CM

## 2018-05-27 DIAGNOSIS — G9341 Metabolic encephalopathy: Secondary | ICD-10-CM | POA: Diagnosis not present

## 2018-05-27 DIAGNOSIS — R5381 Other malaise: Secondary | ICD-10-CM

## 2018-05-27 DIAGNOSIS — I2510 Atherosclerotic heart disease of native coronary artery without angina pectoris: Secondary | ICD-10-CM | POA: Diagnosis not present

## 2018-05-27 DIAGNOSIS — I25119 Atherosclerotic heart disease of native coronary artery with unspecified angina pectoris: Secondary | ICD-10-CM

## 2018-05-27 DIAGNOSIS — I5032 Chronic diastolic (congestive) heart failure: Secondary | ICD-10-CM | POA: Diagnosis not present

## 2018-05-27 DIAGNOSIS — M069 Rheumatoid arthritis, unspecified: Secondary | ICD-10-CM | POA: Diagnosis not present

## 2018-05-27 DIAGNOSIS — M47894 Other spondylosis, thoracic region: Secondary | ICD-10-CM | POA: Diagnosis not present

## 2018-05-27 DIAGNOSIS — I5031 Acute diastolic (congestive) heart failure: Secondary | ICD-10-CM | POA: Diagnosis not present

## 2018-05-27 DIAGNOSIS — N183 Chronic kidney disease, stage 3 (moderate): Secondary | ICD-10-CM | POA: Diagnosis not present

## 2018-05-27 DIAGNOSIS — I130 Hypertensive heart and chronic kidney disease with heart failure and stage 1 through stage 4 chronic kidney disease, or unspecified chronic kidney disease: Secondary | ICD-10-CM

## 2018-05-27 DIAGNOSIS — J9601 Acute respiratory failure with hypoxia: Secondary | ICD-10-CM | POA: Diagnosis not present

## 2018-05-27 DIAGNOSIS — D631 Anemia in chronic kidney disease: Secondary | ICD-10-CM | POA: Diagnosis not present

## 2018-05-27 NOTE — Chronic Care Management (AMB) (Signed)
  Chronic Care Management   Note  05/27/2018 Name: [name] MRN: [id number] DOB: 02/02/1940    Chronic Care Management   Outreach Note  05/27/2018 Name: [name] MRN: [id number] DOB: August 20, 1940  Referred by: Glendale Chard, MD Reason for referral : Chronic Care Management (CCM RN TELEPHONE OUTREACH )   An unsuccessful telephone outreach was attempted today. The patient was referred to the case management team by Glendale Chard MD for assistance with chronic care management and care coordination.   Follow Up Plan: Telephone follow up appointment with CCM team member scheduled for: 05/28/18   [name], [facility] Care Management Coordinator [place] Management/[facility]  Direct Phone: [phone]

## 2018-05-27 NOTE — Chronic Care Management (AMB) (Signed)
  Chronic Care Management    Clinical Social Work Follow Up Note  05/27/2018 Name: DONYELLE ENYEART MRN: [id number] DOB: 20-Aug-1940  [name] is a 78 y.o. year old female who is a primary care patient of Glendale Chard, MD. The CCM team was consulted for assistance with Community Resources and Level of Care Concerns.   Review of patient status, including review of consultants reports, other relevant assessments, and collaboration with appropriate care team members and the patient's provider was performed as part of comprehensive patient evaluation and provision of chronic care management services.    I placed a collaborative call to the patients [place].   Goals Addressed            This Visit's Progress   . "I need help caring for my wife"   On track    Spouse states  Current Barriers:  [name] Knowledge Deficits related to potential complications secondary to immobility  . Lacks caregiver support  . Chronic Disease Management support and education needs related to how to provide total care, obtain DME and utilize the appropriate resources available  Nurse Case Manager Clinical Goal(s):  [name] Over the next 30 days, patient/spouse will verbalize understanding of plan for obtaining DME.  . Over the next 60 days, patient/spouse will verbalize understanding of Palliative Care Services.  . Over the next 90 days, patient will work with the CCM team to improve knowledge and understanding about potential complications associated with patient's impaired physical mobility including complications related to skin breakdown, and pneumonia, malnutrition and DVT.   Interventions:  . Outbound call to [place] to assess outcome of Friday home visit . Determined [name] spoke with patient's spouse in depth about concerns the Home Health team has with patient care - [name] reports Mr. Dancer has been encouraged to look for alternate caregiver resources  throughout his coommunity . Collaboration with the patients primary care provider to request a home health bath aide be ordered . Collaboration with [place] Management Assistant Director to determine there are no other programs to assist the patient as this time with caregiver hours . Collaboration with CCM RN Case Manager to provide an update on goal progression  Patient Self Care Activities:   Mr. kodee drury understanding of the education/information provided today  . Currently UNABLE TO independently participate in [place]  Please see past updates related to this goal by clicking on the "Past Updates" button in the selected goal           Follow Up Plan: SW will follow up with patient by phone over the next 7 days   [name], [place], CDP [facility] / [facility] Care Management Social Worker [phone]  Total time spent performing care coordination and/or care management activities with the patient by phone or face to face = 37 minutes.

## 2018-05-27 NOTE — Patient Instructions (Signed)
Social Worker Visit Information  Goals we discussed today:  Goals Addressed            This Visit's Progress   . "I need help caring for my wife"   On track    Spouse states  Current Barriers:  Becky Kitchen Knowledge Deficits related to potential complications secondary to immobility  . Lacks caregiver support  . Chronic Disease Management support and education needs related to how to provide total care, obtain DME and utilize the appropriate resources available  Nurse Case Manager Clinical Goal(s):  Becky Kitchen Over the next 30 days, patient/spouse will verbalize understanding of plan for obtaining DME.  . Over the next 60 days, patient/spouse will verbalize understanding of Palliative Care Services.  . Over the next 90 days, patient will work with the CCM team to improve knowledge and understanding about potential complications associated with patient's impaired physical mobility including complications related to skin breakdown, and pneumonia, malnutrition and DVT.   Interventions:  . Outbound call to [place] to assess outcome of Friday home visit . Determined Mrs [name] spoke with patient's spouse in depth about concerns the Home Health team has with patient care - Mrs [name] reports [name] has been encouraged to look for alternate caregiver resources throughout his coommunity . Collaboration with the patients primary care provider to request a home health bath aide be ordered . Collaboration with [place] Management Assistant Director to determine there are no other programs to assist the patient as this time with caregiver hours . Collaboration with CCM RN Case Manager to provide an update on goal progression  Patient Self Care Activities:   Mr. [name] understanding of the education/information provided today  . Currently UNABLE TO independently participate in [place]  Please see past updates related to this goal by clicking on the "Past Updates" button in the  selected goal           Materials Provided: No. Patient not reached.  Follow Up Plan: SW will follow up with patient by phone over the next 7 days   [name], [place], CDP [facility] / [place] Management Social Worker [phone]

## 2018-05-28 ENCOUNTER — Telehealth: Payer: Self-pay

## 2018-05-28 ENCOUNTER — Ambulatory Visit: Payer: Self-pay

## 2018-05-28 DIAGNOSIS — G9341 Metabolic encephalopathy: Secondary | ICD-10-CM | POA: Diagnosis not present

## 2018-05-28 DIAGNOSIS — I130 Hypertensive heart and chronic kidney disease with heart failure and stage 1 through stage 4 chronic kidney disease, or unspecified chronic kidney disease: Secondary | ICD-10-CM | POA: Diagnosis not present

## 2018-05-28 DIAGNOSIS — I5032 Chronic diastolic (congestive) heart failure: Secondary | ICD-10-CM

## 2018-05-28 DIAGNOSIS — D631 Anemia in chronic kidney disease: Secondary | ICD-10-CM | POA: Diagnosis not present

## 2018-05-28 DIAGNOSIS — J9601 Acute respiratory failure with hypoxia: Secondary | ICD-10-CM | POA: Diagnosis not present

## 2018-05-28 DIAGNOSIS — I25119 Atherosclerotic heart disease of native coronary artery with unspecified angina pectoris: Secondary | ICD-10-CM | POA: Diagnosis not present

## 2018-05-28 DIAGNOSIS — I5031 Acute diastolic (congestive) heart failure: Secondary | ICD-10-CM | POA: Diagnosis not present

## 2018-05-28 DIAGNOSIS — N183 Chronic kidney disease, stage 3 (moderate): Secondary | ICD-10-CM | POA: Diagnosis not present

## 2018-05-28 DIAGNOSIS — R5381 Other malaise: Secondary | ICD-10-CM

## 2018-05-28 DIAGNOSIS — M059 Rheumatoid arthritis with rheumatoid factor, unspecified: Secondary | ICD-10-CM

## 2018-05-28 DIAGNOSIS — M47894 Other spondylosis, thoracic region: Secondary | ICD-10-CM | POA: Diagnosis not present

## 2018-05-28 DIAGNOSIS — I2510 Atherosclerotic heart disease of native coronary artery without angina pectoris: Secondary | ICD-10-CM | POA: Diagnosis not present

## 2018-05-28 DIAGNOSIS — M069 Rheumatoid arthritis, unspecified: Secondary | ICD-10-CM | POA: Diagnosis not present

## 2018-05-28 LAB — CBC AND DIFFERENTIAL
HCT: 37 (ref 36–46)
Hemoglobin: 11.4 — AB (ref 12.0–16.0)
Platelets: 301 (ref 150–399)
WBC: 8.2

## 2018-05-28 LAB — BASIC METABOLIC PANEL
BUN: 31 — AB (ref 4–21)
Creatinine: 1.6 — AB (ref 0.5–1.1)
Glucose: 123
Potassium: 5.1 (ref 3.4–5.3)
Sodium: 146 (ref 137–147)

## 2018-05-28 NOTE — Chronic Care Management (AMB) (Signed)
  Chronic Care Management    Clinical Social Work Follow Up Note  05/28/2018 Name: KAHLA RISDON MRN: [id number] DOB: Sep 07, 1940  [name] is a 78 y.o. year old female who is a primary care patient of Glendale Chard, MD. The CCM team was consulted for assistance with [employer], Level of Care Concerns and Hospice/Palliative Care Services Education.   Review of patient status, including review of consultants reports, other relevant assessments, and collaboration with appropriate care team members and the patient's provider was performed as part of comprehensive patient evaluation and provision of chronic care management services.    I placed a care coordination call to the patients home health social worker, [name].   Goals Addressed            This Visit's Progress   . "I need help caring for my wife"       Spouse states  Current Barriers:  [name] Knowledge Deficits related to potential complications secondary to immobility  . Lacks caregiver support  . Chronic Disease Management support and education needs related to how to provide total care, obtain DME and utilize the appropriate resources available  Nurse Case Manager Clinical Goal(s):  [name] Over the next 30 days, patient/spouse will verbalize understanding of plan for obtaining DME.  . Over the next 60 days, patient/spouse will verbalize understanding of Palliative Care Services.  . Over the next 90 days, patient will work with the CCM team to improve knowledge and understanding about potential complications associated with patient's impaired physical mobility including complications related to skin breakdown, and pneumonia, malnutrition and DVT.   Interventions:  . Collaboration with [place] to Mrs. Clapp that this SW has been unable to identify specific programs to assist with patient care . Confirmed the patient does have home health aide ordered - [name] reports  the aide started today (5/12) . Assessed for [facility] plan in relation to concerns of patient care - [name] reports that although the clinicians have been concerned with the care in the home for Mrs. Litle; both she and the home health RN no longer feel this patient is in need of an APS referral at this time as the patients spouse and primary caregiver is "really trying" . Determined that Mr. Peeters has been working with home health SW to come up with a long-term plan to assist with patient care - [name] reports speaking to Mr. Tarquinio this morning. Mr. Stringfield is in process of identifying persons to help care for the patient in the home . Requested [name] keep the CCM team updated on patient progress with home health . Collaboration with [employer] to provide an update of home health SW findings  Patient Self Care Activities:  . Currently UNABLE TO independently participate in [place]  Please see past updates related to this goal by clicking on the "Past Updates" button in the selected goal        Follow Up Plan: SW will follow up with Mr. Shackett over the next week to follow up on patient goals.   [name], BSW, CDP [facility] / [facility] Care Management Social Worker [phone]  Total time spent performing care coordination and/or care management activities with the patient by phone or face to face = 13 minutes.

## 2018-05-28 NOTE — Patient Instructions (Signed)
Social Worker Visit Information  Goals we discussed today:  Goals Addressed            This Visit's Progress   . "I need help caring for my wife"       Spouse states  Current Barriers:  [name] Knowledge Deficits related to potential complications secondary to immobility  . Lacks caregiver support  . Chronic Disease Management support and education needs related to how to provide total care, obtain DME and utilize the appropriate resources available  Nurse Case Manager Clinical Goal(s):  [name] Over the next 30 days, patient/spouse will verbalize understanding of plan for obtaining DME.  . Over the next 60 days, patient/spouse will verbalize understanding of Palliative Care Services.  . Over the next 90 days, patient will work with the CCM team to improve knowledge and understanding about potential complications associated with patient's impaired physical mobility including complications related to skin breakdown, and pneumonia, malnutrition and DVT.   Interventions:  . Collaboration with [place] to Mrs. Clapp that this SW has been unable to identify specific programs to assist with patient care . Confirmed the patient does have home health aide ordered - [name] reports the aide started today (5/12) . Assessed for [facility] plan in relation to concerns of patient care - [name] reports that although the clinicians have been concerned with the care in the home for Mrs. Teffeteller; both she and the home health RN no longer feel this patient is in need of an APS referral at this time as the patients spouse and primary caregiver is "really trying" . Determined that Mr. Fidalgo has been working with home health SW to come up with a long-term plan to assist with patient care - [name] reports speaking to Mr. Daus this morning. Mr. Makar is in process of identifying persons to help care for the patient in the home . Requested [name] keep the  CCM team updated on patient progress with home health . Collaboration with [place] to provide an update of home health SW findings  Patient Self Care Activities:  . Currently UNABLE TO independently participate in [place]  Please see past updates related to this goal by clicking on the "Past Updates" button in the selected goal           Materials Provided: No. Patient not reached.  Follow Up Plan: SW will follow up with patient by phone over the next week   [name], BSW, CDP [facility] / [place] Management Social Worker [phone]

## 2018-05-29 ENCOUNTER — Ambulatory Visit: Payer: Self-pay

## 2018-05-29 ENCOUNTER — Telehealth: Payer: Self-pay

## 2018-05-29 ENCOUNTER — Other Ambulatory Visit: Payer: Self-pay

## 2018-05-29 DIAGNOSIS — I130 Hypertensive heart and chronic kidney disease with heart failure and stage 1 through stage 4 chronic kidney disease, or unspecified chronic kidney disease: Secondary | ICD-10-CM

## 2018-05-29 DIAGNOSIS — M059 Rheumatoid arthritis with rheumatoid factor, unspecified: Secondary | ICD-10-CM

## 2018-05-29 DIAGNOSIS — I5032 Chronic diastolic (congestive) heart failure: Secondary | ICD-10-CM | POA: Diagnosis not present

## 2018-05-29 DIAGNOSIS — R5381 Other malaise: Secondary | ICD-10-CM

## 2018-05-29 DIAGNOSIS — I25119 Atherosclerotic heart disease of native coronary artery with unspecified angina pectoris: Secondary | ICD-10-CM | POA: Diagnosis not present

## 2018-05-29 DIAGNOSIS — I2510 Atherosclerotic heart disease of native coronary artery without angina pectoris: Secondary | ICD-10-CM | POA: Diagnosis not present

## 2018-05-29 DIAGNOSIS — N183 Chronic kidney disease, stage 3 (moderate): Secondary | ICD-10-CM | POA: Diagnosis not present

## 2018-05-29 NOTE — Chronic Care Management (AMB) (Signed)
  Chronic Care Management   Follow Up Note   05/29/2018 Name: [name] MRN: [id number] DOB: 02/10/1940  Referred by: Glendale Chard, MD Reason for referral : Chronic Care Management (CCM RN Telephone Follow Up )   MARGUERITTE GUTHRIDGE is a 79 y.o. year old female who is a primary care patient of Glendale Chard, MD. The CCM team was consulted for assistance with chronic disease management and care coordination needs.    Review of patient status, including review of consultants reports, relevant laboratory and other test results, and collaboration with appropriate care team members and the patient's provider was performed as part of comprehensive patient evaluation and provision of chronic care management services.    I spoke with Mr. Lasseigne today by telephone to follow up on patient's DME and long term care plan for Mrs. Garber.   Goals Addressed    . "I need help caring for my wife"       Spouse states  Current Barriers:  [name] Knowledge Deficits related to potential complications secondary to immobility  . Lacks caregiver support  . Chronic Disease Management support and education needs related to how to provide total care, obtain DME and utilize the appropriate resources available  Nurse Case Manager Clinical Goal(s):  [name] Over the next 30 days, patient/spouse will verbalize understanding of plan for obtaining DME.  . Over the next 60 days, patient/spouse will verbalize understanding of Palliative Care Services.  . Over the next 90 days, patient will work with the CCM team to improve knowledge and understanding about potential complications associated with patient's impaired physical mobility including complications related to skin breakdown, and pneumonia, malnutrition and DVT.   Interventions:   Completed CCM RN Telephone Follow Up call with spouse Kerrianne Jeng  Discussed orders were faxed to [facility] for hospital bed with trapeze bar and gel overlay   Discussed need for a new  W/C, discussed a transfer chair may work best   Secure message sent to Dr. Baird Cancer with request for Rx for transfer chair-order has been sent  Discussed long term care plan; Mr. Garlock confirmed he received notification that patients Medicaid has been approved  Secure message sent to embedded [name] requesting outreach to Mr. Calais to discuss long term care options and POA  Confirmed Mr. Woollard has contact #RN CM and discussed nurse availability  Scheduled a follow up call with Mr. Guin for 5-7 days  Patient Self Care Activities:  . Currently UNABLE TO independently participate in [place]  Please see past updates related to this goal by clicking on the "Past Updates" button in the selected goal         The CCM team will reach out to the patient again over the next 5-7 days.    [name], RN,CCM Care Management Coordinator [place] [facility]  Direct Phone: [phone]

## 2018-05-29 NOTE — Patient Instructions (Signed)
Visit Information  Goals Addressed    . "I need help caring for my wife"       Spouse states  Current Barriers:  [name] Knowledge Deficits related to potential complications secondary to immobility  . Lacks caregiver support  . Chronic Disease Management support and education needs related to how to provide total care, obtain DME and utilize the appropriate resources available  Nurse Case Manager Clinical Goal(s):  [name] Over the next 30 days, patient/spouse will verbalize understanding of plan for obtaining DME.  . Over the next 60 days, patient/spouse will verbalize understanding of Palliative Care Services.  . Over the next 90 days, patient will work with the CCM team to improve knowledge and understanding about potential complications associated with patient's impaired physical mobility including complications related to skin breakdown, and pneumonia, malnutrition and DVT.   Interventions:  . Inbound call received from PTA [facility] from [place] with patient update . Discussed DME orders sent to [facility] by Dr. Baird Cancer for hospital bed, with trapeze bar and gel overlay  . Discussed other DME needs, pt may benefit from having a new light weight W/C and or transfer chair . Discussed patients progress with PT and potential for meeting PT goals . Advised the CCM team is working with the patient/husband to provide education for chronic disease management, community resources, and care coordination . Encouraged a call back as needed for patient status updates and or concerns  Patient Self Care Activities:  . Currently UNABLE TO independently participate in [place]  Please see past updates related to this goal by clicking on the "Past Updates" button in the selected goal        The patient verbalized understanding of instructions provided today and declined a print copy of patient instruction materials.   The CCM team will reach out to the patient again over the next 5-7 days.   [name],  RN,CCM Care Management Coordinator [place] [facility]  Direct Phone: [phone]

## 2018-05-29 NOTE — Chronic Care Management (AMB) (Addendum)
  Chronic Care Management   Follow Up Note   05/28/2018 Name: [name] MRN: [id number] DOB: 1940-02-26  Referred by: Glendale Chard, MD Reason for referral : Chronic Care Management (CCM RN Telephone Follow Up )   SARANYA HARLIN is a 78 y.o. year old female who is a primary care patient of Glendale Chard, MD. The CCM team was consulted for assistance with chronic disease management and care coordination needs.    Review of patient status, including review of consultants reports, relevant laboratory and other test results, and collaboration with appropriate care team members and the patient's provider was performed as part of comprehensive patient evaluation and provision of chronic care management services.    Collaboration with [name] regarding patient's progress with in home PT.   Goals Addressed    . "I need help caring for my wife"       Spouse states  Current Barriers:  [name] Knowledge Deficits related to potential complications secondary to immobility  . Lacks caregiver support  . Chronic Disease Management support and education needs related to how to provide total care, obtain DME and utilize the appropriate resources available  Nurse Case Manager Clinical Goal(s):  [name] Over the next 30 days, patient/spouse will verbalize understanding of plan for obtaining DME.  . Over the next 60 days, patient/spouse will verbalize understanding of Palliative Care Services.  . Over the next 90 days, patient will work with the CCM team to improve knowledge and understanding about potential complications associated with patient's impaired physical mobility including complications related to skin breakdown, and pneumonia, malnutrition and DVT.   Interventions:  . Inbound call received from PTA [facility] from [place] with patient update . Discussed DME orders sent to [facility] by Dr. Baird Cancer for hospital bed, with trapeze bar and gel overlay  . Discussed other DME needs, pt may benefit  from having a new light weight W/C and or transfer chair . Discussed patients progress with PT and potential for meeting PT goals . Advised the CCM team is working with the patient/husband to provide education for chronic disease management, community resources, and care coordination . Encouraged a call back as needed for patient status updates and or concerns  Patient Self Care Activities:  . Currently UNABLE TO independently participate in [place]  Please see past updates related to this goal by clicking on the "Past Updates" button in the selected goal         The CCM team will reach out to the patient again over the next 7-10 days.   [name], RN,CCM Care Management Coordinator [place] [facility]  Direct Phone: [phone]

## 2018-05-29 NOTE — Patient Instructions (Signed)
Visit Information  Goals Addressed    . "I need help caring for my wife"       Spouse states  Current Barriers:  [name] Knowledge Deficits related to potential complications secondary to immobility  . Lacks caregiver support  . Chronic Disease Management support and education needs related to how to provide total care, obtain DME and utilize the appropriate resources available  Nurse Case Manager Clinical Goal(s):  [name] Over the next 30 days, patient/spouse will verbalize understanding of plan for obtaining DME.  . Over the next 60 days, patient/spouse will verbalize understanding of Palliative Care Services.  . Over the next 90 days, patient will work with the CCM team to improve knowledge and understanding about potential complications associated with patient's impaired physical mobility including complications related to skin breakdown, and pneumonia, malnutrition and DVT.   Interventions:   Completed CCM RN Telephone Follow Up call with spouse Chrystel Barefield  Discussed orders were faxed to [facility] for hospital bed with trapeze bar and gel overlay   Discussed need for a new W/C, discussed a transfer chair may work best   Secure message sent to Dr. Baird Cancer with request for Rx for transfer chair-order has been sent  Discussed long term care plan; Mr. Jutte confirmed he received notification that patients Medicaid has been approved  Secure message sent to embedded [name] requesting outreach to Mr. Byron to discuss long term care options and POA  Confirmed Mr. Phillips has contact #RN CM and discussed nurse availability  Scheduled a follow up call with Mr. Zeringue for 5-7 days  Patient Self Care Activities:  . Currently UNABLE TO independently participate in [place]  Please see past updates related to this goal by clicking on the "Past Updates" button in the selected goal        The patient verbalized understanding of instructions provided today and declined a print copy  of patient instruction materials.   The CCM team will reach out to the patient again over the next 5-7 days.   [name], RN,CCM Care Management Coordinator [place] [facility]  Direct Phone: [phone]

## 2018-05-29 NOTE — Chronic Care Management (AMB) (Signed)
Chronic Care Management    Clinical Social Work Follow Up Note  05/29/2018 Name: ROXANNA MCEVER MRN: [id number] DOB: [birth year]  [name] is a 78 y.o. year old female who is a primary care patient of Glendale Chard, MD. The CCM team was consulted for assistance with Community Resources and Level of Care Concerns.   Review of patient status, including review of consultants reports, other relevant assessments, and collaboration with appropriate care team members and the patient's provider was performed as part of comprehensive patient evaluation and provision of chronic care management services.     I placed a call to the patients spouse, Blaine Hamper, in response to collaboration with CCM RN Case Manager.  Goals Addressed            This Visit's Progress    "I need help caring for my wife"       Spouse states  Current Barriers:   Knowledge Deficits related to potential complications secondary to immobility   Lacks caregiver support   Chronic Disease Management support and education needs related to how to provide total care, obtain DME and utilize the appropriate resources available  Nurse Case Manager Clinical Goal(s):   Over the next 30 days, patient/spouse will verbalize understanding of plan for obtaining DME.   Over the next 60 days, patient/spouse will verbalize understanding of Palliative Care Services.   Over the next 90 days, patient will work with the CCM team to improve knowledge and understanding about potential complications associated with patient's impaired physical mobility including complications related to skin breakdown, and pneumonia, malnutrition and DVT.   Interventions:   Collaboration with Jola Baptist intake coordinator with [facility] to inquire PACE operating specifics in response to COVID 19. Determined PACE is enrolling new participants but the day center is currently closed. 07-25-22 cut-off has passed, now working on 08/03/2022  enrollments.  Completed CCM SW telephone follow up with patient spouse Hanako Tipping  Confirmed the patient received Medicaid approval letter on Monday May 11  Assessed for type of Medicaid - Mr Wesenberg reports he is not in the home and does not recall specifics of the letter  Educated Mr. Toelle on different types of Medicaid in reference to long-term care Medicaid versus community Medicaid coverage  Discussed opportunity to place the patient into a long term care facility. Mr. Mcdougall declines this option stating "I do not want her to go back there at all. I am doing everything I can to keep her out of places like that"  Educated Mr. Kozma on the PACE program and how the program serves its participants.   Encouraged Mr. Parlow to think about if he would be interested in this enrollment for the patient  Reviewed community Medicaid caregiver options with Mr. Dejarnette including PCS and CAP services  Informed Mr. Skalsky the CCM SW would collaborate with [place] to request a second home visit to review Medicaid letter with Mr Guerin and discuss caregiver options for the patient  Collaborative call placed to [name] who reported needing to contact CCM SW at a later time due to being in a home visit  Patient Self Care Activities:   Currently UNABLE TO independently participate in [place]  Please see past updates related to this goal by clicking on the "Past Updates" button in the selected goal           Follow Up Plan: SW will follow up with patient by  phone over the next 3 days   [name], [place], CDP [facility] Care Management Social Worker [phone]  Total time spent performing care coordination and/or care management activities with the patient by phone or face to face = 15 minutes.

## 2018-05-29 NOTE — Patient Instructions (Signed)
Social Worker Visit Information  Goals we discussed today:  Goals Addressed            This Visit's Progress   . "I need help caring for my wife"       Spouse states  Current Barriers:  [name] Knowledge Deficits related to potential complications secondary to immobility  . Lacks caregiver support  . Chronic Disease Management support and education needs related to how to provide total care, obtain DME and utilize the appropriate resources available  Nurse Case Manager Clinical Goal(s):  [name] Over the next 30 days, patient/spouse will verbalize understanding of plan for obtaining DME.  . Over the next 60 days, patient/spouse will verbalize understanding of Palliative Care Services.  . Over the next 90 days, patient will work with the CCM team to improve knowledge and understanding about potential complications associated with patient's impaired physical mobility including complications related to skin breakdown, and pneumonia, malnutrition and DVT.   Interventions:   Collaboration with Jola Baptist intake coordinator with [facility] to inquire PACE operating specifics in response to COVID 19. Determined PACE is enrolling new participants but the day center is currently closed. 11-Jul-2022 cut-off has passed, now working on Jul 20, 2022 enrollments.  Completed CCM SW telephone follow up with patient spouse Keesha Pellum  Confirmed the patient received Medicaid approval letter on Monday May 11  Assessed for type of Medicaid - Mr Chrisley reports he is not in the home and does not recall specifics of the letter  Educated Mr. Denes on different types of Medicaid in reference to long-term care Medicaid versus community Medicaid coverage  Discussed opportunity to place the patient into a long term care facility. Mr. Malanga declines this option stating "I do not want her to go back there at all. I am doing everything I can to keep her out of places like that"  Educated Mr. Diep on the PACE program  and how the program serves its participants.   Encouraged Mr. Nachtigal to think about if he would be interested in this enrollment for the patient  Reviewed community Medicaid caregiver options with Mr. Streed including PCS and CAP services  Informed Mr. Farone the CCM SW would collaborate with [place] to request a second home visit to review Medicaid letter with Mr Kujala and discuss caregiver options for the patient  Collaborative call placed to [name] who reported needing to contact CCM SW at a later time due to being in a home visit  Patient Self Care Activities:  . Currently UNABLE TO independently participate in [place]  Please see past updates related to this goal by clicking on the "Past Updates" button in the selected goal           Materials Provided: No: Patient declined  Follow Up Plan: SW will follow up with patient by phone over the next 3 days   [name], [place], CDP [facility] [place] Management Social Worker [phone]

## 2018-05-30 ENCOUNTER — Telehealth: Payer: Self-pay

## 2018-05-30 ENCOUNTER — Ambulatory Visit: Payer: Self-pay

## 2018-05-30 DIAGNOSIS — I2510 Atherosclerotic heart disease of native coronary artery without angina pectoris: Secondary | ICD-10-CM | POA: Diagnosis not present

## 2018-05-30 DIAGNOSIS — I5032 Chronic diastolic (congestive) heart failure: Secondary | ICD-10-CM

## 2018-05-30 DIAGNOSIS — N183 Chronic kidney disease, stage 3 (moderate): Secondary | ICD-10-CM | POA: Diagnosis not present

## 2018-05-30 DIAGNOSIS — M069 Rheumatoid arthritis, unspecified: Secondary | ICD-10-CM | POA: Diagnosis not present

## 2018-05-30 DIAGNOSIS — D631 Anemia in chronic kidney disease: Secondary | ICD-10-CM | POA: Diagnosis not present

## 2018-05-30 DIAGNOSIS — I5031 Acute diastolic (congestive) heart failure: Secondary | ICD-10-CM | POA: Diagnosis not present

## 2018-05-30 DIAGNOSIS — I130 Hypertensive heart and chronic kidney disease with heart failure and stage 1 through stage 4 chronic kidney disease, or unspecified chronic kidney disease: Secondary | ICD-10-CM | POA: Diagnosis not present

## 2018-05-30 DIAGNOSIS — M059 Rheumatoid arthritis with rheumatoid factor, unspecified: Secondary | ICD-10-CM | POA: Diagnosis not present

## 2018-05-30 DIAGNOSIS — R5381 Other malaise: Secondary | ICD-10-CM

## 2018-05-30 DIAGNOSIS — G9341 Metabolic encephalopathy: Secondary | ICD-10-CM | POA: Diagnosis not present

## 2018-05-30 DIAGNOSIS — I25119 Atherosclerotic heart disease of native coronary artery with unspecified angina pectoris: Secondary | ICD-10-CM | POA: Diagnosis not present

## 2018-05-30 DIAGNOSIS — M47894 Other spondylosis, thoracic region: Secondary | ICD-10-CM | POA: Diagnosis not present

## 2018-05-30 DIAGNOSIS — J9601 Acute respiratory failure with hypoxia: Secondary | ICD-10-CM | POA: Diagnosis not present

## 2018-05-30 NOTE — Progress Notes (Signed)
This encounter was created in error - please disregard.

## 2018-05-30 NOTE — Chronic Care Management (AMB) (Signed)
  Chronic Care Management    Clinical Social Work Follow Up Note  05/30/2018 Name: [name] MRN: [id number] DOB: 03/31/1940  [name] is a 78 y.o. year old female who is a primary care patient of Becky Chard, MD. The CCM team was consulted for assistance with Community Resources and Level of Care Concerns.   Review of patient status, including review of consultants reports, other relevant assessments, and collaboration with appropriate care team members and the patient's provider was performed as part of comprehensive patient evaluation and provision of chronic care management services.     I received an inbound call from [name] home health social worker.  Goals Addressed            This Visit's Progress   . "I need help caring for my wife"       Spouse states  Current Barriers:  [name] Kitchen Knowledge Deficits related to potential complications secondary to immobility  . Lacks caregiver support  . Chronic Disease Management support and education needs related to how to provide total care, obtain DME and utilize the appropriate resources available  Nurse Case Manager Clinical Goal(s):  [name] Kitchen Over the next 30 days, patient/spouse will verbalize understanding of plan for obtaining DME.  . Over the next 60 days, patient/spouse will verbalize understanding of Palliative Care Services.  . Over the next 90 days, patient will work with the CCM team to improve knowledge and understanding about potential complications associated with patient's impaired physical mobility including complications related to skin breakdown, and pneumonia, malnutrition and DVT.   Interventions:   Inbound call from [name] with [place] with [name] regarding patient Medicaid approval - [name] will plan a home visit to assist with navigation of benefits  Patient Self Care Activities:  . Currently UNABLE TO independently participate in [name]  Please see  past updates related to this goal by clicking on the "Past Updates" button in the selected goal           Follow Up Plan: SW will follow up with patient by phone over the next 7-10 days   [name], [place], CDP [facility] / [name] Care Management Social Worker [phone]  Total time spent performing care coordination and/or care management activities with the patient by phone or face to face = 18 minutes.

## 2018-05-30 NOTE — Patient Instructions (Signed)
Social Worker Visit Information  Goals we discussed today:  Goals Addressed            This Visit's Progress   . "I need help caring for my wife"       Spouse states  Current Barriers:  [name] Knowledge Deficits related to potential complications secondary to immobility  . Lacks caregiver support  . Chronic Disease Management support and education needs related to how to provide total care, obtain DME and utilize the appropriate resources available  Nurse Case Manager Clinical Goal(s):  [name] Over the next 30 days, patient/spouse will verbalize understanding of plan for obtaining DME.  . Over the next 60 days, patient/spouse will verbalize understanding of Palliative Care Services.  . Over the next 90 days, patient will work with the CCM team to improve knowledge and understanding about potential complications associated with patient's impaired physical mobility including complications related to skin breakdown, and pneumonia, malnutrition and DVT.   Interventions:   Inbound call from [place] with [place] with Mrs. Momeyer regarding patient Medicaid approval - [name] will plan a home visit to assist with navigation of benefits  Patient Self Care Activities:  . Currently UNABLE TO independently participate in [place]  Please see past updates related to this goal by clicking on the "Past Updates" button in the selected goal           Materials Provided: No. Patient not reached.  Follow Up Plan: SW will follow up with patient by phone over the next 7-10 days  [name], [place], CDP [facility] [place] Management Social Worker [phone]

## 2018-05-30 NOTE — Patient Instructions (Signed)
Visit Information  Goals Addressed    . "I need help caring for my wife"       Spouse states  Current Barriers:  [name] Knowledge Deficits related to potential complications secondary to immobility  . Lacks caregiver support  . Chronic Disease Management support and education needs related to how to provide total care, obtain DME and utilize the appropriate resources available  Nurse Case Manager Clinical Goal(s):  [name] Over the next 30 days, patient/spouse will verbalize understanding of plan for obtaining DME.  . Over the next 60 days, patient/spouse will verbalize understanding of Palliative Care Services.  . Over the next 90 days, patient will work with the CCM team to improve knowledge and understanding about potential complications associated with patient's impaired physical mobility including complications related to skin breakdown, and pneumonia, malnutrition and DVT.   Interventions:   Outbound call placed to [facility]; CSR states they have not received orders from PCP office for DME  Outbound call placed to [facility], spoke with Jacqlyn Larsen, discussed process for faxing DME orders to [facility]  Internal collaboration with CMA Michelle Nasuti re: confirmation of faxed orders for DME on 05/29/18- confirmation confirmed and orders have been re-faxed to [phone]  Inbound call from [name] requesting contact # for [name]; provided phone number as requested  Informed Mr. Azzaro the orders for patient's needed DME have been faxed to [facility]  Scheduled a follow up call with Mr. Bramlett to provide update on DME status when available   Patient Self Care Activities:  . Currently UNABLE TO independently participate in [place]  Please see past updates related to this goal by clicking on the "Past Updates" button in the selected goal        The patient verbalized understanding of instructions provided today and declined a print copy of patient instruction materials.    Telephone follow up appointment with CCM team member scheduled for: 3-5 days   [name], [facility] Care Management Coordinator [place] [facility]  Direct Phone: [phone]

## 2018-05-30 NOTE — Chronic Care Management (AMB) (Signed)
  Chronic Care Management   Follow Up Note   05/30/2018 Name: [name] MRN: [id number] DOB: [birth year]  Referred by: Glendale Chard, MD Reason for referral : Chronic Care Management (CCM RN provider Collaboration )   [name] is a 78 y.o. year old female who is a primary care patient of Glendale Chard, MD. The CCM team was consulted for assistance with chronic disease management and care coordination needs.    Review of patient status, including review of consultants reports, relevant laboratory and other test results, and collaboration with appropriate care team members and the patient's provider was performed as part of comprehensive patient evaluation and provision of chronic care management services.    Multidisciplinary collaboration re: DME follow up. Inbound call received from [name] today.   Goals Addressed    . "I need help caring for my wife"       Spouse states  Current Barriers:  [name] Knowledge Deficits related to potential complications secondary to immobility  . Lacks caregiver support  . Chronic Disease Management support and education needs related to how to provide total care, obtain DME and utilize the appropriate resources available  Nurse Case Manager Clinical Goal(s):  [name] Over the next 30 days, patient/spouse will verbalize understanding of plan for obtaining DME.  . Over the next 60 days, patient/spouse will verbalize understanding of Palliative Care Services.  . Over the next 90 days, patient will work with the CCM team to improve knowledge and understanding about potential complications associated with patient's impaired physical mobility including complications related to skin breakdown, and pneumonia, malnutrition and DVT.   Interventions:   Outbound call placed to [facility]; CSR states they have not received orders from PCP office for DME  Outbound call placed to [facility], spoke with Jacqlyn Larsen, discussed process for faxing DME orders  to [place]  Internal collaboration with CMA Michelle Nasuti re: confirmation of faxed orders for DME on 05/29/18- confirmation confirmed and orders have been re-faxed to [phone]  Inbound call from [name] requesting contact # for [name]; provided phone number as requested  Informed [name] the orders for patient's needed DME have been faxed to [place] [facility]  Scheduled a follow up call with [name] to provide update on DME status when available   Patient Self Care Activities:  . Currently UNABLE TO independently participate in [place]  Please see past updates related to this goal by clicking on the "Past Updates" button in the selected goal         Telephone follow up appointment with CCM team member scheduled for: 3-5 days or when DME update is available    [name], [facility] Care Management Coordinator [place] [facility]  Direct Phone: [phone]

## 2018-05-31 ENCOUNTER — Ambulatory Visit: Payer: Self-pay

## 2018-05-31 ENCOUNTER — Telehealth: Payer: Self-pay

## 2018-05-31 ENCOUNTER — Encounter: Payer: Self-pay | Admitting: Internal Medicine

## 2018-05-31 DIAGNOSIS — J9601 Acute respiratory failure with hypoxia: Secondary | ICD-10-CM | POA: Diagnosis not present

## 2018-05-31 DIAGNOSIS — I130 Hypertensive heart and chronic kidney disease with heart failure and stage 1 through stage 4 chronic kidney disease, or unspecified chronic kidney disease: Secondary | ICD-10-CM

## 2018-05-31 DIAGNOSIS — R5381 Other malaise: Secondary | ICD-10-CM

## 2018-05-31 DIAGNOSIS — M47894 Other spondylosis, thoracic region: Secondary | ICD-10-CM | POA: Diagnosis not present

## 2018-05-31 DIAGNOSIS — G9341 Metabolic encephalopathy: Secondary | ICD-10-CM | POA: Diagnosis not present

## 2018-05-31 DIAGNOSIS — N183 Chronic kidney disease, stage 3 (moderate): Secondary | ICD-10-CM | POA: Diagnosis not present

## 2018-05-31 DIAGNOSIS — I5032 Chronic diastolic (congestive) heart failure: Secondary | ICD-10-CM | POA: Diagnosis not present

## 2018-05-31 DIAGNOSIS — M059 Rheumatoid arthritis with rheumatoid factor, unspecified: Secondary | ICD-10-CM | POA: Diagnosis not present

## 2018-05-31 DIAGNOSIS — I25119 Atherosclerotic heart disease of native coronary artery with unspecified angina pectoris: Secondary | ICD-10-CM | POA: Diagnosis not present

## 2018-05-31 DIAGNOSIS — I2510 Atherosclerotic heart disease of native coronary artery without angina pectoris: Secondary | ICD-10-CM | POA: Diagnosis not present

## 2018-05-31 DIAGNOSIS — D631 Anemia in chronic kidney disease: Secondary | ICD-10-CM | POA: Diagnosis not present

## 2018-05-31 DIAGNOSIS — I5031 Acute diastolic (congestive) heart failure: Secondary | ICD-10-CM | POA: Diagnosis not present

## 2018-05-31 DIAGNOSIS — M069 Rheumatoid arthritis, unspecified: Secondary | ICD-10-CM | POA: Diagnosis not present

## 2018-05-31 NOTE — Chronic Care Management (AMB) (Signed)
  Chronic Care Management    Clinical Social Work Follow Up Note  05/31/2018 Name: [name] MRN: [id number] DOB: 1940-04-08  [name] is a 78 y.o. year old female who is a primary care patient of Glendale Chard, MD. The CCM team was consulted for assistance with Community Resources and Level of Care Concerns.   Review of patient status, including review of consultants reports, other relevant assessments, and collaboration with appropriate care team members and the patient's provider was performed as part of comprehensive patient evaluation and provision of chronic care management services.     Collaboration with home health SW to discuss concern of patient caregiver availability.  Goals Addressed            This Visit's Progress   . "I need help caring for my wife"       Spouse states  Current Barriers:  [name] Kitchen Knowledge Deficits related to potential complications secondary to immobility  . Lacks caregiver support  . Chronic Disease Management support and education needs related to how to provide total care, obtain DME and utilize the appropriate resources available  Nurse Case Manager Clinical Goal(s):  [name] Kitchen Over the next 30 days, patient/spouse will verbalize understanding of plan for obtaining DME.  . Over the next 60 days, patient/spouse will verbalize understanding of Palliative Care Services.  . Over the next 90 days, patient will work with the CCM team to improve knowledge and understanding about potential complications associated with patient's impaired physical mobility including complications related to skin breakdown, and pneumonia, malnutrition and DVT.   Interventions:   Inbound call from [name] who reports continued concerns over the patients care in the home and the plan to contact APS on behalf of the patient  Determined the patients home health team has visited the home on multiple occassions to find the patient soiled with no  caregiver to assist with medication administration, meal preparation, or incontinence care.   Collaboration with the patients primary care provider Dr Glendale Chard via in basket message to notify of home health concerns and referral to [name] with CCM RN Case Manager to communicate home health concerns and plan to contact APS  Patient Self Care Activities:  . Currently UNABLE TO independently participate in [name]  Please see past updates related to this goal by clicking on the "Past Updates" button in the selected goal           Follow Up Plan: SW will follow up with patient by phone over the next 5-7 days   [name], [place], CDP [facility] / [place] Worker [phone]  Total time spent performing care coordination and/or care management activities with the patient by phone or face to face = 15 minutes.

## 2018-05-31 NOTE — Patient Instructions (Signed)
Social Worker Visit Information  Goals we discussed today:  Goals Addressed            This Visit's Progress   . "I need help caring for my wife"       Spouse states  Current Barriers:  [name] Knowledge Deficits related to potential complications secondary to immobility  . Lacks caregiver support  . Chronic Disease Management support and education needs related to how to provide total care, obtain DME and utilize the appropriate resources available  Nurse Case Manager Clinical Goal(s):  [name] Over the next 30 days, patient/spouse will verbalize understanding of plan for obtaining DME.  . Over the next 60 days, patient/spouse will verbalize understanding of Palliative Care Services.  . Over the next 90 days, patient will work with the CCM team to improve knowledge and understanding about potential complications associated with patient's impaired physical mobility including complications related to skin breakdown, and pneumonia, malnutrition and DVT.   Interventions:   Inbound call from [place] who reports continued concerns over the patients care in the home and the plan to contact APS on behalf of the patient  Determined the patients home health team has visited the home on multiple occassions to find the patient soiled with no caregiver to assist with medication administration, meal preparation, or incontinence care.   Collaboration with the patients primary care provider Dr Glendale Chard via in basket message to notify of home health concerns and referral to [place] with CCM RN Case Manager to communicate home health concerns and plan to contact APS  Patient Self Care Activities:  . Currently UNABLE TO independently participate in [place]  Please see past updates related to this goal by clicking on the "Past Updates" button in the selected goal           Materials Provided: No. Patient not reached.  Follow Up Plan: SW will follow up with  patient by phone over the next 5-7 days   [name], BSW, CDP [facility] / [place] [facility] Social Worker [phone]

## 2018-06-04 ENCOUNTER — Telehealth: Payer: Self-pay

## 2018-06-04 DIAGNOSIS — M47894 Other spondylosis, thoracic region: Secondary | ICD-10-CM | POA: Diagnosis not present

## 2018-06-04 DIAGNOSIS — M069 Rheumatoid arthritis, unspecified: Secondary | ICD-10-CM | POA: Diagnosis not present

## 2018-06-04 DIAGNOSIS — N183 Chronic kidney disease, stage 3 (moderate): Secondary | ICD-10-CM | POA: Diagnosis not present

## 2018-06-04 DIAGNOSIS — I130 Hypertensive heart and chronic kidney disease with heart failure and stage 1 through stage 4 chronic kidney disease, or unspecified chronic kidney disease: Secondary | ICD-10-CM | POA: Diagnosis not present

## 2018-06-04 DIAGNOSIS — G9341 Metabolic encephalopathy: Secondary | ICD-10-CM | POA: Diagnosis not present

## 2018-06-04 DIAGNOSIS — D631 Anemia in chronic kidney disease: Secondary | ICD-10-CM | POA: Diagnosis not present

## 2018-06-04 DIAGNOSIS — J9601 Acute respiratory failure with hypoxia: Secondary | ICD-10-CM | POA: Diagnosis not present

## 2018-06-04 DIAGNOSIS — I5031 Acute diastolic (congestive) heart failure: Secondary | ICD-10-CM | POA: Diagnosis not present

## 2018-06-04 DIAGNOSIS — I2510 Atherosclerotic heart disease of native coronary artery without angina pectoris: Secondary | ICD-10-CM | POA: Diagnosis not present

## 2018-06-04 MED ORDER — PANTOPRAZOLE SODIUM 40 MG PO TBEC: 40.0000 mg | DELAYED_RELEASE_TABLET | Freq: Every day | ORAL | 1 refills | Status: AC

## 2018-06-04 MED ORDER — ATORVASTATIN CALCIUM 80 MG PO TABS: 80.0000 mg | ORAL_TABLET | Freq: Every day | ORAL | 1 refills | Status: AC

## 2018-06-04 NOTE — Telephone Encounter (Signed)
The pt's granddaughter [name] called and wanted to ask if the pt still needed to take the pantoprazole and the atorvastatin because the bottle says authorization need for refill.  The pt's refills were faxed to the pharmacy.

## 2018-06-05 ENCOUNTER — Telehealth: Payer: Self-pay

## 2018-06-05 ENCOUNTER — Ambulatory Visit: Payer: Self-pay

## 2018-06-05 DIAGNOSIS — I25119 Atherosclerotic heart disease of native coronary artery with unspecified angina pectoris: Secondary | ICD-10-CM | POA: Diagnosis not present

## 2018-06-05 DIAGNOSIS — M059 Rheumatoid arthritis with rheumatoid factor, unspecified: Secondary | ICD-10-CM

## 2018-06-05 DIAGNOSIS — I5031 Acute diastolic (congestive) heart failure: Secondary | ICD-10-CM | POA: Diagnosis not present

## 2018-06-05 DIAGNOSIS — I130 Hypertensive heart and chronic kidney disease with heart failure and stage 1 through stage 4 chronic kidney disease, or unspecified chronic kidney disease: Secondary | ICD-10-CM

## 2018-06-05 DIAGNOSIS — M069 Rheumatoid arthritis, unspecified: Secondary | ICD-10-CM | POA: Diagnosis not present

## 2018-06-05 DIAGNOSIS — M47894 Other spondylosis, thoracic region: Secondary | ICD-10-CM | POA: Diagnosis not present

## 2018-06-05 DIAGNOSIS — I5032 Chronic diastolic (congestive) heart failure: Secondary | ICD-10-CM | POA: Diagnosis not present

## 2018-06-05 DIAGNOSIS — R5381 Other malaise: Secondary | ICD-10-CM

## 2018-06-05 DIAGNOSIS — G9341 Metabolic encephalopathy: Secondary | ICD-10-CM | POA: Diagnosis not present

## 2018-06-05 DIAGNOSIS — J9601 Acute respiratory failure with hypoxia: Secondary | ICD-10-CM | POA: Diagnosis not present

## 2018-06-05 DIAGNOSIS — N183 Chronic kidney disease, stage 3 (moderate): Secondary | ICD-10-CM | POA: Diagnosis not present

## 2018-06-05 DIAGNOSIS — D631 Anemia in chronic kidney disease: Secondary | ICD-10-CM | POA: Diagnosis not present

## 2018-06-05 DIAGNOSIS — I2510 Atherosclerotic heart disease of native coronary artery without angina pectoris: Secondary | ICD-10-CM | POA: Diagnosis not present

## 2018-06-05 NOTE — Patient Instructions (Signed)
Social Worker Visit Information  Goals we discussed today:  Goals Addressed            This Visit's Progress   . "I need help caring for my wife"       Spouse states  Current Barriers:  [name] Knowledge Deficits related to potential complications secondary to immobility  . Lacks caregiver support  . Chronic Disease Management support and education needs related to how to provide total care, obtain DME and utilize the appropriate resources available  Nurse Case Manager Clinical Goal(s):  [name] Over the next 30 days, patient/spouse will verbalize understanding of plan for obtaining DME.  . Over the next 60 days, patient/spouse will verbalize understanding of Palliative Care Services.  . Over the next 90 days, patient will work with the CCM team to improve knowledge and understanding about potential complications associated with patient's impaired physical mobility including complications related to skin breakdown, and pneumonia, malnutrition and DVT.   CCM SW Interventions:  06/05/18  Inbound call from [place] who reports voice message left with APS referral line on Friday May 15th as previously planned but a return call was not received. Home Health PTA and RN have since visited the patient home and witnessed patients grand-daughter in the home assisting  Determined an APS referral has not been placed at the time of today's call based on home health team members seeing some improvement  Discussed plan for home health SW to continue to follow and visit with the patient to ensure an APS referral in fact is not needed- [name] reports being in contact with a case worker at [facility] to staff the patient case and request the patients needs be expedited via the block grant for an in home caregiver  Collaboration with CCM RN Case Manager to provide updates on patient goal progression  Second inbound call from [employer] with [facility] who reports initiating a pop in visit to  the patients home in hopes of be able to speak with the patient and her grand-daughter - it is indicated the patient is home alone without a caregiver. [name] reports contacting the patients spouse who reports the patient has only been home alone approximately 20 minutes and he would be home soon after work  Information obtained that after patients spouse returned home he found the patient on the floor in the kitchen. [name] reports contacting 911 to assist the patient off the floor and back into bed. The patient reports to [name] she was attempting to get jello as she has not eaten all day  CCM SW informed [name] has successfully opened a case with APS on behalf of the patient. It is further reported Mr Olden admit an inability to continue caring for the patient and being ready to place her into long term care  Determined [name] would contact DSS caseworker assigned to the patients case regarding Mr Fonte's desire for placement  Collaboration with patients primary care physician and CCM RN Case Manager to provide updates on goal progression  Patient Self Care Activities:  . Currently UNABLE TO independently participate in [place]  Please see past updates related to this goal by clicking on the "Past Updates" button in the selected goal           Materials Provided: No. Patient not reached.  Follow Up Plan: SW will follow up with patient by phone over the next 3-5 days   [name], BSW, CDP  [facility] / [place] [facility] Social Worker [phone]

## 2018-06-05 NOTE — Chronic Care Management (AMB) (Signed)
Chronic Care Management    Clinical Social Work Follow Up Note  06/05/2018 Name: [name] MRN: [id number] DOB: [birth year]  [name] is a 78 y.o. year old female who is a primary care patient of Glendale Chard, MD. The CCM team was consulted for assistance with Community Resources and Level of Care Concerns.   Review of patient status, including review of consultants reports, other relevant assessments, and collaboration with appropriate care team members and the patient's provider was performed as part of comprehensive patient evaluation and provision of chronic care management services.     Goals Addressed            This Visit's Progress   . "I need help caring for my wife"       Spouse states  Current Barriers:  [name] Knowledge Deficits related to potential complications secondary to immobility  . Lacks caregiver support  . Chronic Disease Management support and education needs related to how to provide total care, obtain DME and utilize the appropriate resources available  Nurse Case Manager Clinical Goal(s):  [name] Over the next 30 days, patient/spouse will verbalize understanding of plan for obtaining DME.  . Over the next 60 days, patient/spouse will verbalize understanding of Palliative Care Services.  . Over the next 90 days, patient will work with the CCM team to improve knowledge and understanding about potential complications associated with patient's impaired physical mobility including complications related to skin breakdown, and pneumonia, malnutrition and DVT.   CCM SW Interventions:  06/05/18  Inbound call from [facility] who reports voice message left with APS referral line on Friday May 15th as previously planned but a return call was not received. Home Health PTA and RN have since visited the patient home and witnessed patients grand-daughter in the home assisting  Determined an APS referral has not been placed at the time of today's  call based on home health team members seeing some improvement  Discussed plan for home health SW to continue to follow and visit with the patient to ensure an APS referral in fact is not needed- [name] reports being in contact with a case worker at [facility] to staff the patient case and request the patients needs be expedited via the block grant for an in home caregiver  Collaboration with CCM RN Case Manager to provide updates on patient goal progression  Second inbound call from [employer] with [facility] who reports initiating a pop in visit to the patients home in hopes of be able to speak with the patient and her grand-daughter - it is indicated the patient is home alone without a caregiver. Becky. [name] reports contacting the patients spouse who reports the patient has only been home alone approximately 20 minutes and he would be home soon after work  Information obtained that after patients spouse returned home he found the patient on the floor in the kitchen. [name] reports contacting 911 to assist the patient off the floor and back into bed. The patient reports to [name] she was attempting to get jello as she has not eaten all day  CCM SW informed [name] has successfully opened a case with APS on behalf of the patient. It is further reported Mr Torrens admit an inability to continue caring for the patient and being ready to place her into long term care  Determined [name] would contact DSS caseworker assigned to the patients case regarding Mr Wlodarczyk's desire for placement  Collaboration with patients primary care physician and CCM RN Case Manager to provide updates on goal progression  Patient Self Care Activities:  . Currently UNABLE TO independently participate in [place]  Please see past updates related to this goal by clicking on the "Past Updates" button in the selected goal           Follow Up Plan: SW will follow up with Mr Beam within the next 3-5 business days   [name], [place], CDP [facility] / [facility] Social Worker [phone]  Total time spent performing care coordination and/or care management activities with the patient by phone or face to face = 35 minutes.

## 2018-06-06 ENCOUNTER — Emergency Department (HOSPITAL_COMMUNITY): Payer: Medicare PPO

## 2018-06-06 ENCOUNTER — Telehealth: Payer: Self-pay

## 2018-06-06 ENCOUNTER — Ambulatory Visit: Payer: Self-pay

## 2018-06-06 ENCOUNTER — Other Ambulatory Visit: Payer: Self-pay

## 2018-06-06 ENCOUNTER — Encounter (HOSPITAL_COMMUNITY): Payer: Self-pay | Admitting: Emergency Medicine

## 2018-06-06 ENCOUNTER — Inpatient Hospital Stay (HOSPITAL_COMMUNITY)
Admission: EM | Admit: 2018-06-06 | Discharge: 2018-06-11 | DRG: 481 | Disposition: A | Discharge status: Home Health | Payer: Medicare PPO | Attending: Internal Medicine | Admitting: Internal Medicine

## 2018-06-06 DIAGNOSIS — W19XXXA Unspecified fall, initial encounter: Secondary | ICD-10-CM | POA: Diagnosis not present

## 2018-06-06 DIAGNOSIS — R5381 Other malaise: Secondary | ICD-10-CM

## 2018-06-06 DIAGNOSIS — N320 Bladder-neck obstruction: Secondary | ICD-10-CM | POA: Diagnosis not present

## 2018-06-06 DIAGNOSIS — N183 Chronic kidney disease, stage 3 (moderate): Secondary | ICD-10-CM | POA: Diagnosis present

## 2018-06-06 DIAGNOSIS — M059 Rheumatoid arthritis with rheumatoid factor, unspecified: Secondary | ICD-10-CM | POA: Diagnosis not present

## 2018-06-06 DIAGNOSIS — I10 Essential (primary) hypertension: Secondary | ICD-10-CM | POA: Diagnosis not present

## 2018-06-06 DIAGNOSIS — Z955 Presence of coronary angioplasty implant and graft: Secondary | ICD-10-CM | POA: Diagnosis not present

## 2018-06-06 DIAGNOSIS — S72142A Displaced intertrochanteric fracture of left femur, initial encounter for closed fracture: Secondary | ICD-10-CM | POA: Diagnosis not present

## 2018-06-06 DIAGNOSIS — Z419 Encounter for procedure for purposes other than remedying health state, unspecified: Secondary | ICD-10-CM

## 2018-06-06 DIAGNOSIS — Z9181 History of falling: Secondary | ICD-10-CM | POA: Diagnosis not present

## 2018-06-06 DIAGNOSIS — R0689 Other abnormalities of breathing: Secondary | ICD-10-CM | POA: Diagnosis not present

## 2018-06-06 DIAGNOSIS — S72145A Nondisplaced intertrochanteric fracture of left femur, initial encounter for closed fracture: Secondary | ICD-10-CM

## 2018-06-06 DIAGNOSIS — Z66 Do not resuscitate: Secondary | ICD-10-CM | POA: Diagnosis not present

## 2018-06-06 DIAGNOSIS — M25512 Pain in left shoulder: Secondary | ICD-10-CM | POA: Diagnosis present

## 2018-06-06 DIAGNOSIS — I130 Hypertensive heart and chronic kidney disease with heart failure and stage 1 through stage 4 chronic kidney disease, or unspecified chronic kidney disease: Secondary | ICD-10-CM | POA: Diagnosis not present

## 2018-06-06 DIAGNOSIS — Z86718 Personal history of other venous thrombosis and embolism: Secondary | ICD-10-CM | POA: Diagnosis not present

## 2018-06-06 DIAGNOSIS — I5032 Chronic diastolic (congestive) heart failure: Secondary | ICD-10-CM

## 2018-06-06 DIAGNOSIS — S299XXA Unspecified injury of thorax, initial encounter: Secondary | ICD-10-CM | POA: Diagnosis not present

## 2018-06-06 DIAGNOSIS — Y92000 Kitchen of unspecified non-institutional (private) residence as  the place of occurrence of the external cause: Secondary | ICD-10-CM | POA: Diagnosis not present

## 2018-06-06 DIAGNOSIS — S72142D Displaced intertrochanteric fracture of left femur, subsequent encounter for closed fracture with routine healing: Secondary | ICD-10-CM | POA: Diagnosis not present

## 2018-06-06 DIAGNOSIS — Z7902 Long term (current) use of antithrombotics/antiplatelets: Secondary | ICD-10-CM

## 2018-06-06 DIAGNOSIS — I2510 Atherosclerotic heart disease of native coronary artery without angina pectoris: Secondary | ICD-10-CM | POA: Diagnosis not present

## 2018-06-06 DIAGNOSIS — W01198A Fall on same level from slipping, tripping and stumbling with subsequent striking against other object, initial encounter: Secondary | ICD-10-CM | POA: Diagnosis present

## 2018-06-06 DIAGNOSIS — I422 Other hypertrophic cardiomyopathy: Secondary | ICD-10-CM | POA: Diagnosis not present

## 2018-06-06 DIAGNOSIS — G9341 Metabolic encephalopathy: Secondary | ICD-10-CM | POA: Diagnosis not present

## 2018-06-06 DIAGNOSIS — R54 Age-related physical debility: Secondary | ICD-10-CM | POA: Diagnosis not present

## 2018-06-06 DIAGNOSIS — I340 Nonrheumatic mitral (valve) insufficiency: Secondary | ICD-10-CM | POA: Diagnosis present

## 2018-06-06 DIAGNOSIS — N179 Acute kidney failure, unspecified: Secondary | ICD-10-CM | POA: Diagnosis not present

## 2018-06-06 DIAGNOSIS — G40909 Epilepsy, unspecified, not intractable, without status epilepticus: Secondary | ICD-10-CM | POA: Diagnosis present

## 2018-06-06 DIAGNOSIS — I5031 Acute diastolic (congestive) heart failure: Secondary | ICD-10-CM | POA: Diagnosis not present

## 2018-06-06 DIAGNOSIS — M47894 Other spondylosis, thoracic region: Secondary | ICD-10-CM | POA: Diagnosis not present

## 2018-06-06 DIAGNOSIS — E785 Hyperlipidemia, unspecified: Secondary | ICD-10-CM | POA: Diagnosis present

## 2018-06-06 DIAGNOSIS — S72002A Fracture of unspecified part of neck of left femur, initial encounter for closed fracture: Secondary | ICD-10-CM | POA: Diagnosis not present

## 2018-06-06 DIAGNOSIS — S72009A Fracture of unspecified part of neck of unspecified femur, initial encounter for closed fracture: Secondary | ICD-10-CM | POA: Diagnosis present

## 2018-06-06 DIAGNOSIS — Z8719 Personal history of other diseases of the digestive system: Secondary | ICD-10-CM | POA: Diagnosis not present

## 2018-06-06 DIAGNOSIS — Z79899 Other long term (current) drug therapy: Secondary | ICD-10-CM

## 2018-06-06 DIAGNOSIS — G43909 Migraine, unspecified, not intractable, without status migrainosus: Secondary | ICD-10-CM | POA: Diagnosis not present

## 2018-06-06 DIAGNOSIS — I252 Old myocardial infarction: Secondary | ICD-10-CM

## 2018-06-06 DIAGNOSIS — K31819 Angiodysplasia of stomach and duodenum without bleeding: Secondary | ICD-10-CM

## 2018-06-06 DIAGNOSIS — M069 Rheumatoid arthritis, unspecified: Secondary | ICD-10-CM | POA: Diagnosis not present

## 2018-06-06 DIAGNOSIS — I25119 Atherosclerotic heart disease of native coronary artery with unspecified angina pectoris: Secondary | ICD-10-CM | POA: Diagnosis not present

## 2018-06-06 DIAGNOSIS — Z1159 Encounter for screening for other viral diseases: Secondary | ICD-10-CM

## 2018-06-06 DIAGNOSIS — M25552 Pain in left hip: Secondary | ICD-10-CM | POA: Diagnosis present

## 2018-06-06 DIAGNOSIS — I25118 Atherosclerotic heart disease of native coronary artery with other forms of angina pectoris: Secondary | ICD-10-CM | POA: Diagnosis not present

## 2018-06-06 DIAGNOSIS — D631 Anemia in chronic kidney disease: Secondary | ICD-10-CM | POA: Diagnosis not present

## 2018-06-06 DIAGNOSIS — D649 Anemia, unspecified: Secondary | ICD-10-CM | POA: Diagnosis not present

## 2018-06-06 DIAGNOSIS — Z87891 Personal history of nicotine dependence: Secondary | ICD-10-CM | POA: Diagnosis not present

## 2018-06-06 DIAGNOSIS — Z20828 Contact with and (suspected) exposure to other viral communicable diseases: Secondary | ICD-10-CM | POA: Diagnosis not present

## 2018-06-06 DIAGNOSIS — E782 Mixed hyperlipidemia: Secondary | ICD-10-CM | POA: Diagnosis not present

## 2018-06-06 DIAGNOSIS — Z7952 Long term (current) use of systemic steroids: Secondary | ICD-10-CM

## 2018-06-06 DIAGNOSIS — J9601 Acute respiratory failure with hypoxia: Secondary | ICD-10-CM | POA: Diagnosis not present

## 2018-06-06 DIAGNOSIS — D62 Acute posthemorrhagic anemia: Secondary | ICD-10-CM | POA: Diagnosis not present

## 2018-06-06 HISTORY — DX: Chronic diastolic (congestive) heart failure: I50.32

## 2018-06-06 HISTORY — DX: Other hypertrophic cardiomyopathy: I42.2

## 2018-06-06 HISTORY — DX: Arteriovenous malformation, site unspecified: Q27.30

## 2018-06-06 HISTORY — DX: Other symptoms and signs involving the musculoskeletal system: R29.898

## 2018-06-06 HISTORY — DX: Nonrheumatic mitral (valve) insufficiency: I34.0

## 2018-06-06 LAB — CBC WITH DIFFERENTIAL/PLATELET
Abs Immature Granulocytes: 0.07 10*3/uL (ref 0.00–0.07)
Basophils Absolute: 0.1 10*3/uL (ref 0.0–0.1)
Basophils Relative: 1 %
Eosinophils Absolute: 0.1 10*3/uL (ref 0.0–0.5)
Eosinophils Relative: 1 %
HCT: 37.5 % (ref 36.0–46.0)
Hemoglobin: 11.6 g/dL — ABNORMAL LOW (ref 12.0–15.0)
Immature Granulocytes: 1 %
Lymphocytes Relative: 8 %
Lymphs Abs: 0.9 10*3/uL (ref 0.7–4.0)
MCH: 25.8 pg — ABNORMAL LOW (ref 26.0–34.0)
MCHC: 30.9 g/dL (ref 30.0–36.0)
MCV: 83.5 fL (ref 80.0–100.0)
Monocytes Absolute: 1.5 10*3/uL — ABNORMAL HIGH (ref 0.1–1.0)
Monocytes Relative: 12 %
Neutro Abs: 9.7 10*3/uL — ABNORMAL HIGH (ref 1.7–7.7)
Neutrophils Relative %: 77 %
Platelets: 311 10*3/uL (ref 150–400)
RBC: 4.49 MIL/uL (ref 3.87–5.11)
RDW: 18.9 % — ABNORMAL HIGH (ref 11.5–15.5)
WBC: 12.3 10*3/uL — ABNORMAL HIGH (ref 4.0–10.5)
nRBC: 0 % (ref 0.0–0.2)

## 2018-06-06 LAB — SARS CORONAVIRUS 2 BY RT PCR (HOSPITAL ORDER, PERFORMED IN ~~LOC~~ HOSPITAL LAB): SARS Coronavirus 2: NEGATIVE

## 2018-06-06 LAB — BASIC METABOLIC PANEL
Anion gap: 10 (ref 5–15)
BUN: 24 mg/dL — ABNORMAL HIGH (ref 8–23)
CO2: 22 mmol/L (ref 22–32)
Calcium: 8.9 mg/dL (ref 8.9–10.3)
Chloride: 110 mmol/L (ref 98–111)
Creatinine, Ser: 1.48 mg/dL — ABNORMAL HIGH (ref 0.44–1.00)
GFR calc Af Amer: 39 mL/min — ABNORMAL LOW (ref >60)
GFR calc non Af Amer: 33 mL/min — ABNORMAL LOW (ref >60)
Glucose, Bld: 96 mg/dL (ref 70–99)
Potassium: 4.8 mmol/L (ref 3.5–5.1)
Sodium: 142 mmol/L (ref 135–145)

## 2018-06-06 MED ORDER — SENNOSIDES-DOCUSATE SODIUM 8.6-50 MG PO TABS: 1.0000 | ORAL_TABLET | Freq: Every evening | ORAL | Status: DC | PRN

## 2018-06-06 MED ORDER — LEVETIRACETAM 500 MG PO TABS
1000.0000 mg | ORAL_TABLET | Freq: Every day | ORAL | Status: DC
  Administered 2018-06-07 – 2018-06-11 (×5): 1000 mg via ORAL
  Filled 2018-06-06 (×5): qty 2

## 2018-06-06 MED ORDER — ONDANSETRON HCL 4 MG/2ML IJ SOLN: 4.0000 mg | Freq: Four times a day (QID) | INTRAMUSCULAR | Status: DC | PRN

## 2018-06-06 MED ORDER — POLYSACCHARIDE IRON COMPLEX 150 MG PO CAPS
150.0000 mg | ORAL_CAPSULE | Freq: Every day | ORAL | Status: DC
  Administered 2018-06-07 – 2018-06-11 (×5): 150 mg via ORAL
  Filled 2018-06-06 (×5): qty 1

## 2018-06-06 MED ORDER — ATORVASTATIN CALCIUM 80 MG PO TABS
80.0000 mg | ORAL_TABLET | Freq: Every day | ORAL | Status: DC
  Administered 2018-06-07 – 2018-06-11 (×5): 80 mg via ORAL
  Filled 2018-06-06 (×5): qty 1

## 2018-06-06 MED ORDER — PANTOPRAZOLE SODIUM 40 MG PO TBEC
40.0000 mg | DELAYED_RELEASE_TABLET | Freq: Every day | ORAL | Status: DC
  Administered 2018-06-07 – 2018-06-11 (×5): 40 mg via ORAL
  Filled 2018-06-06 (×5): qty 1

## 2018-06-06 MED ORDER — MORPHINE SULFATE (PF) 2 MG/ML IV SOLN
0.5000 mg | INTRAVENOUS | Status: DC | PRN
  Administered 2018-06-06 – 2018-06-07 (×2): 0.5 mg via INTRAVENOUS
  Filled 2018-06-06 (×2): qty 1

## 2018-06-06 MED ORDER — MIRTAZAPINE 15 MG PO TABS
15.0000 mg | ORAL_TABLET | Freq: Every day | ORAL | Status: DC
  Administered 2018-06-07 – 2018-06-10 (×5): 15 mg via ORAL
  Filled 2018-06-06 (×5): qty 1

## 2018-06-06 MED ORDER — TICAGRELOR 90 MG PO TABS
90.0000 mg | ORAL_TABLET | Freq: Two times a day (BID) | ORAL | Status: DC
  Administered 2018-06-07 – 2018-06-11 (×8): 90 mg via ORAL
  Filled 2018-06-06 (×9): qty 1

## 2018-06-06 MED ORDER — CARVEDILOL 25 MG PO TABS
25.0000 mg | ORAL_TABLET | Freq: Two times a day (BID) | ORAL | Status: DC
  Administered 2018-06-07 – 2018-06-11 (×10): 25 mg via ORAL
  Filled 2018-06-06 (×10): qty 1

## 2018-06-06 MED ORDER — FUROSEMIDE 40 MG PO TABS
40.0000 mg | ORAL_TABLET | Freq: Every day | ORAL | Status: DC
  Administered 2018-06-07 – 2018-06-09 (×3): 40 mg via ORAL
  Filled 2018-06-06 (×3): qty 1

## 2018-06-06 MED ORDER — PREDNISONE 5 MG PO TABS
5.0000 mg | ORAL_TABLET | Freq: Every day | ORAL | Status: DC
  Administered 2018-06-08 – 2018-06-11 (×4): 5 mg via ORAL
  Filled 2018-06-06 (×4): qty 1

## 2018-06-06 MED ORDER — SODIUM CHLORIDE 0.9 % IV SOLN
INTRAVENOUS | Status: DC
  Administered 2018-06-07: 01:00:00 via INTRAVENOUS

## 2018-06-06 MED ORDER — HYDROCODONE-ACETAMINOPHEN 5-325 MG PO TABS
1.0000 | ORAL_TABLET | Freq: Four times a day (QID) | ORAL | Status: DC | PRN
  Administered 2018-06-07 – 2018-06-10 (×3): 1 via ORAL
  Filled 2018-06-06 (×4): qty 1

## 2018-06-06 NOTE — ED Provider Notes (Signed)
[facility] DEPT Provider Note   CSN: [id number] Arrival date & time: 06/06/18  1641    History   Chief Complaint Chief Complaint  Patient presents with  . Fall    HPI [name] is a 78 y.o. female.     HPI Patient presents to the emergency department with left hip and shoulder pain following a fall that occurred yesterday.  The patient states that EMS assisted her back into bed she did not want to be transported at that time.  Patient states that she did not have any other injuries.  The patient states that pain is worse with movement and palpation.  The patient denies chest pain, shortness of breath, headache,blurred vision, neck pain, fever, cough, weakness, numbness, dizziness, anorexia, edema, abdominal pain, nausea, vomiting, diarrhea, rash, back pain, dysuria, hematemesis, bloody stool, near syncope, or syncope. Past Medical History:  Diagnosis Date  . Acute kidney injury superimposed on chronic kidney disease ([place]) 02/06/2015  . Anxiety   . CAD (coronary artery disease) 02/28/2018   s/p stent  . Depression   . DVT (deep venous thrombosis) (HCC) 1970s   LLE  . Heart murmur   . Hypercholesterolemia   . Hypertension   . Kidney stones   . Migraine    "a few/year" (07/16/2015)  . Multiple falls   . Pneumonia "several times"  . Rheumatoid arthritis(714.0)    "all over" (07/16/2015)  . Seizure [facility])    "last one was 1//2017" (07/16/2015)  . Syncope and collapse   . Vision abnormalities     Patient Active Problem List   Diagnosis Date Noted  . Closed left hip fracture, initial encounter ([place]) 06/06/2018  . Acute hypoxemic respiratory failure ([place]) 04/21/2018  . Suspected Covid-19 Virus Infection 04/21/2018  . Protein-calorie malnutrition, severe 03/31/2018  . Dehydration 03/29/2018  . Failure to thrive in adult 03/29/2018  . Acute blood loss anemia   . Occult blood in stools   . Gastric AVM   . STEMI (ST elevation myocardial  infarction) ([place]) 02/28/2018  . Acute inferior myocardial infarction ([place]) 02/28/2018  . Respiratory arrest ([place])   . Essential hypertension   . Respiratory failure ([place])   . SIRS (systemic inflammatory response syndrome) ([place]) 08/11/2016  . Nausea vomiting and diarrhea 08/11/2016  . Renal failure (ARF), acute on chronic (HCC) 08/11/2016  . High risk medications (not anticoagulants) long-term use 04/20/2016  . Bilateral chronic knee pain 04/20/2016  . Pain in left elbow 04/20/2016  . Chronic kidney disease, stage III (moderate) ([place]) 08/03/2015  . Encounter for general adult medical examination without abnormal findings 08/03/2015  . 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 Other long term (current) drug therapy 08/03/2015  . Pain in right shoulder 08/03/2015  . Postmenopausal bleeding 08/03/2015  . Vitamin D deficiency 08/03/2015  . Localized pain of right shoulder joint 08/03/2015  . Primary hyperparathyroidism ([place]) 07/16/2015  . Hyperparathyroidism, primary ([place]) 07/14/2015  . Gait disturbance 03/24/2015  . Neck pain 03/24/2015  . Myalgia and myositis 03/24/2015  . Acute kidney injury superimposed on chronic kidney disease ([place]) 02/06/2015  . Leukocytosis 02/06/2015  . Seizure ([place]) 02/05/2015  . Acute encephalopathy 02/05/2015  . Abnormal MRI of head 02/02/2014  . Cognitive changes 02/02/2014  . Migraine without aura and without status migrainosus, not intractable 02/02/2014  . LOC (loss of consciousness) ([place])   . Syncope 01/12/2014  . Syncope and  collapse 01/12/2014  . Hypertensive urgency 01/12/2014  . Rheumatoid arthritis ([place]) 01/12/2014  . Depression 01/12/2014  . Hyperlipidemia 01/12/2014    Past Surgical History:  Procedure Laterality Date  . ABDOMINAL HYSTERECTOMY    . CARPAL TUNNEL RELEASE Bilateral   . CATARACT EXTRACTION W/ INTRAOCULAR LENS  IMPLANT,  BILATERAL Bilateral   . CORONARY/GRAFT ACUTE MI REVASCULARIZATION N/A 02/28/2018   Procedure: Coronary/Graft Acute MI Revascularization;  Surgeon: Jettie Booze, MD;  Location: [facility];  Service: Cardiovascular;  Laterality: N/A;  . ESOPHAGOGASTRODUODENOSCOPY N/A 03/05/2018   Procedure: ESOPHAGOGASTRODUODENOSCOPY (EGD);  Surgeon: Ladene Artist, MD;  Location: [facility] ENDOSCOPY;  Service: Endoscopy;  Laterality: N/A;  . LEFT HEART CATH AND CORONARY ANGIOGRAPHY N/A 02/28/2018   Procedure: LEFT HEART CATH AND CORONARY ANGIOGRAPHY;  Surgeon: Jettie Booze, MD;  Location: [facility];  Service: Cardiovascular;  Laterality: N/A;  . NECK EXPLORATION  07/16/2015  . PARATHYROIDECTOMY Right 07/16/2015   superior  . PARATHYROIDECTOMY Right 07/16/2015   Procedure: RIGHT INFERIOR PARATHYROIDECTOMY;  Surgeon: Armandina Gemma, MD;  Location: [facility];  Service: General;  Laterality: Right;  . SUBMUCOSAL INJECTION  03/05/2018   Procedure: SUBMUCOSAL INJECTION;  Surgeon: Ladene Artist, MD;  Location: [facility] ENDOSCOPY;  Service: Endoscopy;;  . TOENAIL AVULSION Bilateral    great toe     OB History   No obstetric history on file.      Home Medications    Prior to Admission medications   Medication Sig Start Date End Date Taking? Authorizing Provider  acetaminophen (TYLENOL) 325 MG tablet Take 2 tablets (650 mg total) by mouth every 6 (six) hours as needed for mild pain (or Fever >/= 101). 04/03/18   Debbe Odea, MD  atorvastatin (LIPITOR) 80 MG tablet Take 1 tablet (80 mg total) by mouth daily at 6 PM. 06/04/18   Glendale Chard, MD  carvedilol (COREG) 12.5 MG tablet Take 2 tablets (25 mg total) by mouth 2 (two) times daily with a meal. 04/26/18   Ghimire, Henreitta Leber, MD  furosemide (LASIX) 40 MG tablet Take 1 tablet (40 mg total) by mouth daily. 04/26/18   Ghimire, Henreitta Leber, MD  hydroxychloroquine (PLAQUENIL) 200 MG tablet Take 200 mg by mouth daily.  08/02/15   [provider]   iron polysaccharides (NIFEREX) 150 MG capsule Take 1 capsule (150 mg total) by mouth daily. 04/04/18   Debbe Odea, MD  lactose free nutrition (BOOST PLUS) LIQD Take 237 mLs by mouth 3 (three) times daily between meals. 04/03/18   Debbe Odea, MD  levETIRAcetam (KEPPRA) 500 MG tablet TAKE 2 TABLETS BY MOUTH EVERY DAY Patient taking differently: Take 1,000 mg by mouth daily.  03/22/18   Glendale Chard, MD  mirtazapine (REMERON) 15 MG tablet Take 15 mg by mouth at bedtime.    [provider]  nitroGLYCERIN (NITROSTAT) 0.4 MG SL tablet Place 1 tablet (0.4 mg total) under the tongue every 5 (five) minutes as needed. Patient taking differently: Place 0.4 mg under the tongue every 5 (five) minutes as needed for chest pain.  03/08/18   Jettie Booze, MD  ondansetron (ZOFRAN) 4 MG tablet Take 1 tablet (4 mg total) by mouth every 6 (six) hours as needed for nausea. 04/03/18   Debbe Odea, MD  pantoprazole (PROTONIX) 40 MG tablet Take 1 tablet (40 mg total) by mouth daily. 06/04/18   Glendale Chard, MD  predniSONE (DELTASONE) 5 MG tablet Take 1 tablet (5 mg total) by mouth daily with breakfast.  04/01/18   Bo Merino, MD  ticagrelor (BRILINTA) 90 MG TABS tablet Take 1 tablet (90 mg total) by mouth 2 (two) times daily. 03/08/18   Bhagat, Bhavinkumar, PA  UNABLE TO FIND Take 120 mLs by mouth 3 (three) times daily. Med Name: Med Plus 2.0    [provider]  Vitamin D, Ergocalciferol, (DRISDOL) 50000 units CAPS capsule Take 1 capsule (50,000 Units total) by mouth every 7 (seven) days. 08/14/16   Hongalgi, Lenis Dickinson, MD    Family History Family History  Problem Relation Age of Onset  . Cancer Father   . Prostate cancer Father   . Cancer Brother   . Heart disease Brother   . Miscarriages / [place] Brother   . Heart disease Mother   . Pneumonia Mother     Social History Social History   Tobacco Use  . Smoking status: Former Smoker    Packs/day: 1.00    Years: 62.00     Pack years: 62.00    Types: Cigarettes    Last attempt to quit: 01/2018    Years since quitting: 0.3  . Smokeless tobacco: Never Used  Substance Use Topics  . Alcohol use: No  . Drug use: No     Allergies   Patient has no known allergies.   Review of Systems Review of Systems All other systems negative except as documented in the HPI. All pertinent positives and negatives as reviewed in the HPI.  Physical Exam Updated Vital Signs BP 124/74   Pulse 77   Temp 98 F (36.7 C) (Oral)   Resp (!) 22   SpO2 100%   Physical Exam Vitals signs and nursing note reviewed.  Constitutional:      General: She is not in acute distress.    Appearance: She is well-developed.  HENT:     Head: Normocephalic and atraumatic.  Eyes:     Pupils: Pupils are equal, round, and reactive to light.  Neck:     Musculoskeletal: Normal range of motion and neck supple.  Cardiovascular:     Rate and Rhythm: Normal rate and regular rhythm.     Heart sounds: Normal heart sounds. No murmur. No friction rub. No gallop.   Pulmonary:     Effort: Pulmonary effort is normal. No respiratory distress.     Breath sounds: Normal breath sounds. No wheezing.  Musculoskeletal:     Left shoulder: She exhibits tenderness and pain. She exhibits no bony tenderness, no swelling, no deformity and normal pulse.     Left hip: She exhibits decreased range of motion, tenderness and bony tenderness. She exhibits no deformity.  Skin:    General: Skin is warm and dry.     Capillary Refill: Capillary refill takes less than 2 seconds.     Findings: No erythema or rash.  Neurological:     Mental Status: She is alert and oriented to person, place, and time.     Motor: No abnormal muscle tone.     Coordination: Coordination normal.  Psychiatric:        Behavior: Behavior normal.      ED Treatments / Results  Labs (all labs ordered are listed, but only abnormal results are displayed) Labs Reviewed  CBC WITH  DIFFERENTIAL/PLATELET - Abnormal; Notable for the following components:      Result Value   WBC 12.3 (*)    Hemoglobin 11.6 (*)    MCH 25.8 (*)    RDW 18.9 (*)    Neutro Abs  9.7 (*)    Monocytes Absolute 1.5 (*)    All other components within normal limits  SARS CORONAVIRUS 2 (HOSPITAL ORDER, [facility])  BASIC METABOLIC PANEL    EKG None  Radiology Dg Chest 1 View  Result Date: 06/06/2018 CLINICAL DATA:  Fall with hip fracture EXAM: CHEST  1 VIEW COMPARISON:  04/22/2018 FINDINGS: No acute opacity or pleural effusion. Stable cardiomediastinal silhouette with aortic atherosclerosis. No pneumothorax. IMPRESSION: No active disease. Electronically Signed   By: Donavan Foil M.D.   On: 06/06/2018 18:20   Dg Shoulder Left  Result Date: 06/06/2018 CLINICAL DATA:  With shoulder pain EXAM: LEFT SHOULDER - 2+ VIEW COMPARISON:  None. FINDINGS: AC joint is intact.  No fracture or dislocation. IMPRESSION: No acute osseous abnormality Electronically Signed   By: Donavan Foil M.D.   On: 06/06/2018 18:19   Dg Hip Unilat W Or Wo Pelvis 2-3 Views Left  Result Date: 06/06/2018 CLINICAL DATA:  Fall EXAM: DG HIP (WITH OR WITHOUT PELVIS) 2-3V LEFT COMPARISON:  CT 03/29/2018 FINDINGS: Acute mildly comminuted left intertrochanteric fracture. Left femoral head projects in joint. Mild arthritis of both hips. Pubic symphysis is intact IMPRESSION: Acute mildly comminuted left intertrochanteric fracture Electronically Signed   By: Donavan Foil M.D.   On: 06/06/2018 18:18    Procedures Procedures (including critical care time)  Medications Ordered in ED Medications - No data to display   Initial Impression / Assessment and Plan / ED Course  I have reviewed the triage vital signs and the nursing notes.  Pertinent labs & imaging results that were available during my care of the patient were reviewed by me and considered in my medical decision making (see chart for details).         Spoke with the [facility] along with orthopedics.  The patient will be admitted to the hospital for further evaluation care of her acute intratrochanteric fracture.  The patient is advised the plan and all questions were answered.  Patient has no other injuries noted on examination.  Final Clinical Impressions(s) / ED Diagnoses   Final diagnoses:  None    ED Discharge Orders    None       Dalia Heading, PA-C 06/06/18 1917    Gareth Morgan, MD 06/07/18 [phone]

## 2018-06-06 NOTE — ED Notes (Signed)
ED Provider at bedside. 

## 2018-06-06 NOTE — Chronic Care Management (AMB) (Signed)
Chronic Care Management   Follow Up Note   06/06/2018 Name: [name] MRN: [id number] DOB: 1940-11-20  Referred by: Glendale Chard, MD Reason for referral : Chronic Care Management (RNCM Telephone Follow Up )   [name] is a 78 y.o. year old female who is a primary care patient of Glendale Chard, MD. The CCM team was consulted for assistance with chronic disease management and care coordination needs.    Review of patient status, including review of consultants reports, relevant laboratory and other test results, and collaboration with appropriate care team members and the patient's provider was performed as part of comprehensive patient evaluation and provision of chronic care management services.    I spoke with [name] by telephone today to discuss his long term care plan and DME status.   Goals Addressed     "I need help caring for my wife"       Spouse states  Current Barriers:   Knowledge Deficits related to potential complications secondary to immobility   Lacks caregiver support   Chronic Disease Management support and education needs related to how to provide total care, obtain DME and utilize the appropriate resources available  Nurse Case Manager Clinical Goal(s):   Over the next 30 days, patient/spouse will verbalize understanding of plan for obtaining DME.   Over the next 60 days, patient/spouse will verbalize understanding of Palliative Care Services.   Over the next 90 days, patient will work with the CCM team to improve knowledge and understanding about potential complications associated with patient's impaired physical mobility including complications related to skin breakdown, and pneumonia, malnutrition and DVT.   CCM RN Interventions:  Completed 06/06/18  Completed call with spouse [name]  Assessed for long term care plan for patient - [name] states he plans to retire at the end of the month and will care for [name] full  time-he plans to continue to rely on family (grandson and daughter) until then- he states his grandson lives with him and is at the home with [name] daily   Confirmed grandson will be present until someone relieves him, confirms grandson will be able to provide patient meals, assist her to the restroom and ensure she is dry and clean and has all health care needs met including turning and shifting weight and provided fluids throughout the day in order to stay well hydrated  Discussed status of DME - [name] states he has communicated with [place] and the DME will be delivered to the home on Monday, 06/10/18  Discussed status of Palliative Care referral - [name] has not been contacted by Palliative Care  Discussed [name] impression of patient's Medicaid status - [name] states he would like BSW [name] to contact him next week to review the response he received -internal collaboration with [employer] re: update for long term care plan and need for Medicaid f/u w/[name]  Discussed plans for ongoing contact and scheduled a follow up call with [name] for next week  Placed outbound call to [name], spoke with Marzetta Board, she will send a high priority message to the intake nurse and request [name]. Baune be contacted re: the Palliative Care referral asap   Inbound call received from PTA [facility] from [facility];        she reports having to call 911 today to have the patient transported to [facility] for  evaluation and treatment of left lower hip and leg pain; when she arrived the patient was home              alone and she was unable to bend or move her left leg and or hip due to have excruating pain -            Claiborne Billings suspects a possible fracture from the fall patient had at home yesterday  Internal collaboration with BSW [name] re: patient's status and the complaint that was filed to APS by [place] Worker      CCM SW Interventions:  Completed 06/06/18  Collaboration with [place] who reports conversation with the patients spouse - [name] Westra has indicated he is no longer interested in placement and plans to quit working at the end of the month to care for the patient. [name] Tegethoff further discloses his family will care for the patient in the interim  Outbound call to [place] to provide an update on the families plan to no longer place the patient  Determined [name] would follow up on APS regarding the families current caregiver plans  Scheduled outreach call to the patient and her spouse within the next 1-3 business days  Inbound call from [place] who reports the home health PTA found the patient home alone and in severe pain to her L side.   Received information from [name] that the home health PTA has dialed 911 to assist with patient needs  Confirmed an APS referral has been completed on the patients behalf by Mrs Kennieth Francois with patients primary physician and CCM RN Case Manager  Patient Self Care Activities:   Currently UNABLE TO independently participate in [place]  Please see past updates related to this goal by clicking on the "Past Updates" button in the selected goal        Telephone follow up appointment with CCM team member scheduled for: 06/11/18   [name], [place] Management Coordinator [place] Management/[facility]  Direct Phone: [phone]

## 2018-06-06 NOTE — Patient Instructions (Signed)
Visit Information  Goals Addressed    . "I need help caring for my wife"       Spouse states  Current Barriers:  [name] Knowledge Deficits related to potential complications secondary to immobility  . Lacks caregiver support  . Chronic Disease Management support and education needs related to how to provide total care, obtain DME and utilize the appropriate resources available  Nurse Case Manager Clinical Goal(s):  [name] Over the next 30 days, patient/spouse will verbalize understanding of plan for obtaining DME.  . Over the next 60 days, patient/spouse will verbalize understanding of Palliative Care Services.  . Over the next 90 days, patient will work with the CCM team to improve knowledge and understanding about potential complications associated with patient's impaired physical mobility including complications related to skin breakdown, and pneumonia, malnutrition and DVT.   CCM RN Interventions:  Completed 06/06/18  Completed call with spouse Blaine Hamper  Assessed for long term care plan for patient - Mr. Prestridge states he plans to retire at the end of the month and will care for Mrs. Keena full time-he plans to continue to rely on family (grandson and daughter) until then- he states his grandson lives with him and is at the home with Mrs. Larsson daily   Confirmed grandson will be present until someone relieves him, confirms grandson will be able to provide patient meals, assist her to the restroom and ensure she is dry and clean and has all health care needs met including turning and shifting weight and provided fluids throughout the day in order to stay well hydrated  Discussed status of DME - Mr. Mowers states he has communicated with [place] and the DME will be delivered to the home on Monday, 06/10/18  Discussed status of Palliative Care referral - Mr. Suen has not been contacted by Palliative Care  Discussed Mr. Corney impression of patient's Medicaid status - Mr. Mcgowen  states he would like [name] to contact him next week to review the response he received -internal collaboration with [employer] re: update for long term care plan and need for Medicaid f/u w/Mr. Sleeper  Discussed plans for ongoing contact and scheduled a follow up call with Mr. Mazzoni for next week  Placed outbound call to [place], spoke with Marzetta Board, she will send a high priority message to the intake nurse and request Mr. Henneke be contacted re: the Palliative Care referral asap   Inbound call received from PTA [facility] from [facility];        she reports having to call 911 today to have the patient transported to [facility] for          evaluation and treatment of left lower hip and leg pain; when she arrived the patient was                        home alone and she was unable to bend or move her left leg and or hip due to have excruating            pain - Claiborne Billings suspects a possible fracture from the fall patient had at home yesterday  Internal collaboration with [name] re: patient's status and the complaint that was filed to APS by [place] Worker    CCM SW Interventions:  Completed 06/06/18  Collaboration with [place] who reports conversation with the patients spouse - Mr Shearman  has indicated he is no longer interested in placement and plans to quit working at the end of the month to care for the patient. Mr Hasten further discloses his family will care for the patient in the interim  Outbound call to [place] to provide an update on the families plan to no longer place the patient  Determined [name] would follow up on APS regarding the families current caregiver plans  Scheduled outreach call to the patient and her spouse within the next 1-3 business days  Inbound call from [place] who reports the home health PTA found the patient home alone and in severe  pain to her L side.   Received information from [name] that the home health PTA has dialed 911 to assist with patient needs  Confirmed an APS referral has been completed on the patients behalf by Mrs Kennieth Francois with patients primary physician and CCM RN Case Manager  Patient Self Care Activities:  . Currently UNABLE TO independently participate in [place]  Please see past updates related to this goal by clicking on the "Past Updates" button in the selected goal       The patient verbalized understanding of instructions provided today and declined a print copy of patient instruction materials.   Telephone follow up appointment with CCM team member scheduled for: 06/11/18  [name], [facility] Care Management Coordinator [place] Management/[facility]  Direct Phone: [phone]

## 2018-06-06 NOTE — ED Notes (Signed)
Patient transported to X-ray 

## 2018-06-06 NOTE — Chronic Care Management (AMB) (Signed)
  Chronic Care Management    Clinical Social Work Follow Up Note  06/06/2018 Name: [name] MRN: [id number] DOB: 03-23-1940  [name] is a 78 y.o. year old female who is a primary care patient of Glendale Chard, MD. The CCM team was consulted for assistance with Community Resources and Level of Care Concerns.   Review of patient status, including review of consultants reports, other relevant assessments, and collaboration with appropriate care team members and the patient's provider was performed as part of comprehensive patient evaluation and provision of chronic care management services.     Goals Addressed            This Visit's Progress   . "I need help caring for my wife"       Spouse states  Current Barriers:  [name] Knowledge Deficits related to potential complications secondary to immobility  . Lacks caregiver support  . Chronic Disease Management support and education needs related to how to provide total care, obtain DME and utilize the appropriate resources available  Nurse Case Manager Clinical Goal(s):  [name] Over the next 30 days, patient/spouse will verbalize understanding of plan for obtaining DME.  . Over the next 60 days, patient/spouse will verbalize understanding of Palliative Care Services.  . Over the next 90 days, patient will work with the CCM team to improve knowledge and understanding about potential complications associated with patient's impaired physical mobility including complications related to skin breakdown, and pneumonia, malnutrition and DVT.   CCM SW Interventions:  Completed 06/06/18  Collaboration with CCM RN Case Manager Glenard Haring Little who reports conversation with the patients spouse - [name] has indicated he is no longer interested in placement and plans to quit working at the end of the month to care for the patient. [name] further discloses his family will care for the patient in the interim  Outbound call to [place] to provide an update on the families plan to no longer place the patient  Determined [name] would follow up on APS regarding the families current caregiver plans  Scheduled outreach call to the patient and her spouse within the next 1-3 business days  Patient Self Care Activities:  . Currently UNABLE TO independently participate in [place]  Please see past updates related to this goal by clicking on the "Past Updates" button in the selected goal           Follow Up Plan: SW will follow up with patient by phone over the next 1-3 business days   [name], [place], CDP [facility] / [place] Worker [phone]  Total time spent performing care coordination and/or care management activities with the patient by phone or face to face = 15 minutes.

## 2018-06-06 NOTE — ED Notes (Signed)
Unsuccessful IV attempt x2.  

## 2018-06-06 NOTE — Telephone Encounter (Signed)
Spoke with patient's husband to offer to schedule visit with Palliative Care. Per husband, patient is on the way to ED for evaluation due to a fall she had yesterday and continued reports of pain.

## 2018-06-06 NOTE — Consult Note (Signed)
Orthopedic brief consult note Full consult note to follow  Was contacted by the emergency department for [name] a 78 year old female with a history of rheumatoid arthritis and recent myocardial infarction status post stenting who had a ground-level fall with resultant left hip and left shoulder pain.  X-rays in the emergency department revealed an intertrochanteric hip fracture.  Tract hospitalist team has been consulted for admission and orthopedics was consulted for evaluation for potential surgical planning.  After chart review and imaging review as well as conversation with the patient's husband she is indicated for cephalo-medullary nailing of left intertrochanteric hip fracture.  Patient is ambulatory at baseline at home with a walker and occasionally with a cane.  Due to her comorbid conditions she does have difficulty caring for herself and was recently considering hospice however now husband is planning to stay home to take care of his wife.  I requested that the patient be transferred to [facility] for definitive surgical intervention.  She should be n.p.o. per ERAS protocol, please see preoperative order set for details.  This will allow for enhanced recovery after anesthesia.

## 2018-06-06 NOTE — ED Notes (Signed)
Bed: WA02 Expected date:  Expected time:  Means of arrival:  Comments: EMS 77yo fall

## 2018-06-06 NOTE — Patient Instructions (Signed)
Social Worker Visit Information  Goals we discussed today:  Goals Addressed            This Visit's Progress   . "I need help caring for my wife"       Spouse states  Current Barriers:  [name] Knowledge Deficits related to potential complications secondary to immobility  . Lacks caregiver support  . Chronic Disease Management support and education needs related to how to provide total care, obtain DME and utilize the appropriate resources available  Nurse Case Manager Clinical Goal(s):  [name] Over the next 30 days, patient/spouse will verbalize understanding of plan for obtaining DME.  . Over the next 60 days, patient/spouse will verbalize understanding of Palliative Care Services.  . Over the next 90 days, patient will work with the CCM team to improve knowledge and understanding about potential complications associated with patient's impaired physical mobility including complications related to skin breakdown, and pneumonia, malnutrition and DVT.   CCM SW Interventions:  Completed 06/06/18  Collaboration with CCM RN Case Manager Glenard Haring Little who reports conversation with the patients spouse - Mr Bedingfield has indicated he is no longer interested in placement and plans to quit working at the end of the month to care for the patient. Mr Leoni further discloses his family will care for the patient in the interim  Outbound call to [place] to provide an update on the families plan to no longer place the patient  Determined [name] would follow up on APS regarding the families current caregiver plans  Scheduled outreach call to the patient and her spouse within the next 1-3 business days  Patient Self Care Activities:  . Currently UNABLE TO independently participate in [place]  Please see past updates related to this goal by clicking on the "Past Updates" button in the selected goal           Materials Provided: No. Patient not reached.  Follow Up Plan: SW  will follow up with patient by phone over the next 1-3 business days   [name], [place], CDP [facility] / [place] Management Social Worker [phone]

## 2018-06-06 NOTE — ED Triage Notes (Signed)
Per EMS, patient from home, reports fall yesterday. Patient as assisted into bed by EMS at that time. C/o left shoulder and left leg pain today. Pain worsens with movement.

## 2018-06-06 NOTE — ED Notes (Addendum)
[facility] contacted. Paperwork printed.

## 2018-06-06 NOTE — H&P (Addendum)
History and Physical    [name] NOB:[id number] DOB: 1941/01/10 DOA: 06/06/2018  PCP: Glendale Chard, MD  Patient coming from: Home  I have personally briefly reviewed patient's old medical records in [place]  Chief Complaint:   HPI: [name] is a 78 y.o. female with medical history significant of diastolic congestive heart failure, rheumatoid arthritis, CAD status post PCI/stent, HLD, history of GI bleed with gastric AVM who presents to [facility] long ED with complaints of left hip and shoulder pain.  Patient reports fell yesterday when she was trying to go to the trash can and fell against a refrigerator.  EMS was calledt, patient declined further evaluation at that time.  Patient continued with significant pain and immobility today which prompted her presentation.  Patient denies any syncopal episode and can recall event in its entirety.  No other complaints at this time.  Patient denies headache, no fever/chills/night sweats, no nausea/vomiting/diarrhea, no chest pain, palpitations, no abdominal pain, no cough/congestion, no shortness of breath, no paresthesias.  ED Course: Temperature 98.0, HR 81, RR 21, BP 116/63, SPO2 97% on room air.  WBC 12.3, hemoglobin 11.6, platelets 311, sodium 142, potassium 4.8, BUN 24, creatinine 1.48, glucose 96.  COVID-19 test negative.  Hip x-ray with acute mildly comminuted left intratrochanteric fracture.  Left shoulder x-ray negative for fracture.  Chest x-ray negative for acute cardiopulmonary disease process.  ED consulted orthopedics which recommended transfer to [facility] for operative management.  TRH was consulted for admission.  Review of Systems: As per HPI otherwise 10 point review of systems negative.    Past Medical History:  Diagnosis Date  . Acute kidney injury superimposed on chronic kidney disease ([place]) 02/06/2015  . Anxiety   . CAD (coronary artery disease) 02/28/2018   s/p stent  . Depression   . DVT (deep venous  thrombosis) (HCC) 1970s   LLE  . Heart murmur   . Hypercholesterolemia   . Hypertension   . Kidney stones   . Migraine    "a few/year" (07/16/2015)  . Multiple falls   . Pneumonia "several times"  . Rheumatoid arthritis(714.0)    "all over" (07/16/2015)  . Seizure [facility])    "last one was 1//2017" (07/16/2015)  . Syncope and collapse   . Vision abnormalities     Past Surgical History:  Procedure Laterality Date  . ABDOMINAL HYSTERECTOMY    . CARPAL TUNNEL RELEASE Bilateral   . CATARACT EXTRACTION W/ INTRAOCULAR LENS  IMPLANT, BILATERAL Bilateral   . CORONARY/GRAFT ACUTE MI REVASCULARIZATION N/A 02/28/2018   Procedure: Coronary/Graft Acute MI Revascularization;  Surgeon: Jettie Booze, MD;  Location: [place] CV LAB;  Service: Cardiovascular;  Laterality: N/A;  . ESOPHAGOGASTRODUODENOSCOPY N/A 03/05/2018   Procedure: ESOPHAGOGASTRODUODENOSCOPY (EGD);  Surgeon: Ladene Artist, MD;  Location[facility] ENDOSCOPY;  Service: Endoscopy;  Laterality: N/A;  . LEFT HEART CATH AND CORONARY ANGIOGRAPHY N/A 02/28/2018   Procedure: LEFT HEART CATH AND CORONARY ANGIOGRAPHY;  Surgeon: Jettie Booze, MD;  Location: [place] CV LAB;  Service: Cardiovascular;  Laterality: N/A;  . NECK EXPLORATION  07/16/2015  . PARATHYROIDECTOMY Right 07/16/2015   superior  . PARATHYROIDECTOMY Right 07/16/2015   Procedure: RIGHT INFERIOR PARATHYROIDECTOMY;  Surgeon: Armandina Gemma, MD;  Location: [place];  Service: General;  Laterality: Right;  . SUBMUCOSAL INJECTION  03/05/2018   Procedure: SUBMUCOSAL INJECTION;  Surgeon: Ladene Artist, MD;  Location[facility] ENDOSCOPY;  Service: Endoscopy;;  . TOENAIL AVULSION Bilateral    great toe  reports that she quit smoking about 4 months ago. Her smoking use included cigarettes. She has a 62.00 pack-year smoking history. She has never used smokeless tobacco. She reports that she does not drink alcohol or use drugs.  No Known Allergies  Family History  Problem  Relation Age of Onset  . Cancer Father   . Prostate cancer Father   . Cancer Brother   . Heart disease Brother   . Miscarriages / [place] Brother   . Heart disease Mother   . Pneumonia Mother      Prior to Admission medications   Medication Sig Start Date End Date Taking? Authorizing Provider  acetaminophen (TYLENOL) 325 MG tablet Take 2 tablets (650 mg total) by mouth every 6 (six) hours as needed for mild pain (or Fever >/= 101). 04/03/18   Debbe Odea, MD  atorvastatin (LIPITOR) 80 MG tablet Take 1 tablet (80 mg total) by mouth daily at 6 PM. 06/04/18   Glendale Chard, MD  carvedilol (COREG) 12.5 MG tablet Take 2 tablets (25 mg total) by mouth 2 (two) times daily with a meal. 04/26/18   Ghimire, Henreitta Leber, MD  furosemide (LASIX) 40 MG tablet Take 1 tablet (40 mg total) by mouth daily. 04/26/18   Ghimire, Henreitta Leber, MD  hydroxychloroquine (PLAQUENIL) 200 MG tablet Take 200 mg by mouth daily.  08/02/15   [provider]  iron polysaccharides (NIFEREX) 150 MG capsule Take 1 capsule (150 mg total) by mouth daily. 04/04/18   Debbe Odea, MD  lactose free nutrition (BOOST PLUS) LIQD Take 237 mLs by mouth 3 (three) times daily between meals. 04/03/18   Debbe Odea, MD  levETIRAcetam (KEPPRA) 500 MG tablet TAKE 2 TABLETS BY MOUTH EVERY DAY Patient taking differently: Take 1,000 mg by mouth daily.  03/22/18   Glendale Chard, MD  mirtazapine (REMERON) 15 MG tablet Take 15 mg by mouth at bedtime.    [provider]  nitroGLYCERIN (NITROSTAT) 0.4 MG SL tablet Place 1 tablet (0.4 mg total) under the tongue every 5 (five) minutes as needed. Patient taking differently: Place 0.4 mg under the tongue every 5 (five) minutes as needed for chest pain.  03/08/18   Jettie Booze, MD  ondansetron (ZOFRAN) 4 MG tablet Take 1 tablet (4 mg total) by mouth every 6 (six) hours as needed for nausea. 04/03/18   Debbe Odea, MD  pantoprazole (PROTONIX) 40 MG tablet Take 1 tablet (40 mg total)  by mouth daily. 06/04/18   Glendale Chard, MD  predniSONE (DELTASONE) 5 MG tablet Take 1 tablet (5 mg total) by mouth daily with breakfast. 04/01/18   Bo Merino, MD  ticagrelor (BRILINTA) 90 MG TABS tablet Take 1 tablet (90 mg total) by mouth 2 (two) times daily. 03/08/18   Bhagat, Bhavinkumar, PA  UNABLE TO FIND Take 120 mLs by mouth 3 (three) times daily. Med Name: Med Plus 2.0    [provider]  Vitamin D, Ergocalciferol, (DRISDOL) 50000 units CAPS capsule Take 1 capsule (50,000 Units total) by mouth every 7 (seven) days. 08/14/16   Modena Jansky, MD    Physical Exam: Vitals:   06/06/18 1701 06/06/18 1830  BP: 116/63 124/74  Pulse: 81 77  Resp: (!) 21 (!) 22  Temp: 98 F (36.7 C)   TempSrc: Oral   SpO2: 97% 100%    Constitutional: NAD, calm, comfortable Vitals:   06/06/18 1701 06/06/18 1830  BP: 116/63 124/74  Pulse: 81 77  Resp: (!) 21 (!) 22  Temp:  98 F (36.7 C)   TempSrc: Oral   SpO2: 97% 100%   Eyes: PERRL, lids and conjunctivae normal ENMT: Mucous membranes are moist. Posterior pharynx clear of any exudate or lesions.Normal dentition.  Neck: normal, supple, no masses, no thyromegaly Respiratory: clear to auscultation bilaterally, no wheezing, no crackles. Normal respiratory effort. No accessory muscle use.  Cardiovascular: Regular rate and rhythm, no murmurs / rubs / gallops. No extremity edema. 2+ pedal pulses. No carotid bruits.  Abdomen: no tenderness, no masses palpated. No hepatosplenomegaly. Bowel sounds positive.  Musculoskeletal: Significant pain to palpation left hip with decreased range of motion Skin: Red patch noted to nasal bridge, no other rashes/ulcers. No induration Neurologic: CN 2-12 grossly intact. Sensation intact, DTR normal. Strength 5/5 in all 4.  Psychiatric: Normal judgment and insight. Alert and oriented x 3. Normal mood.     Labs on Admission: I have personally reviewed following labs and imaging studies  CBC: Recent  Labs  Lab 06/06/18 1849  WBC 12.3*  NEUTROABS 9.7*  HGB 11.6*  HCT 37.5  MCV 83.5  PLT 665   Basic Metabolic Panel: No results for input(s): NA, K, CL, CO2, GLUCOSE, BUN, CREATININE, CALCIUM, MG, PHOS in the last 168 hours. GFR: CrCl cannot be calculated (Unknown ideal weight.). Liver Function Tests: No results for input(s): AST, ALT, ALKPHOS, BILITOT, PROT, ALBUMIN in the last 168 hours. No results for input(s): LIPASE, AMYLASE in the last 168 hours. No results for input(s): AMMONIA in the last 168 hours. Coagulation Profile: No results for input(s): INR, PROTIME in the last 168 hours. Cardiac Enzymes: No results for input(s): CKTOTAL, CKMB, CKMBINDEX, TROPONINI in the last 168 hours. BNP (last 3 results) No results for input(s): PROBNP in the last 8760 hours. HbA1C: No results for input(s): HGBA1C in the last 72 hours. CBG: No results for input(s): GLUCAP in the last 168 hours. Lipid Profile: No results for input(s): CHOL, HDL, LDLCALC, TRIG, CHOLHDL, LDLDIRECT in the last 72 hours. Thyroid Function Tests: No results for input(s): TSH, T4TOTAL, FREET4, T3FREE, THYROIDAB in the last 72 hours. Anemia Panel: No results for input(s): VITAMINB12, FOLATE, FERRITIN, TIBC, IRON, RETICCTPCT in the last 72 hours. Urine analysis:    Component Value Date/Time   COLORURINE STRAW (A) 04/21/2018 0907   APPEARANCEUR CLEAR 04/21/2018 0907   LABSPEC 1.005 04/21/2018 0907   PHURINE 6.0 04/21/2018 0907   GLUCOSEU NEGATIVE 04/21/2018 0907   HGBUR SMALL (A) 04/21/2018 0907   BILIRUBINUR NEGATIVE 04/21/2018 0907   KETONESUR NEGATIVE 04/21/2018 0907   PROTEINUR NEGATIVE 04/21/2018 0907   UROBILINOGEN 1.0 01/12/2014 2237   NITRITE NEGATIVE 04/21/2018 0907   LEUKOCYTESUR TRACE (A) 04/21/2018 0907    Radiological Exams on Admission: Dg Chest 1 View  Result Date: 06/06/2018 CLINICAL DATA:  Fall with hip fracture EXAM: CHEST  1 VIEW COMPARISON:  04/22/2018 FINDINGS: No acute opacity or  pleural effusion. Stable cardiomediastinal silhouette with aortic atherosclerosis. No pneumothorax. IMPRESSION: No active disease. Electronically Signed   By: Donavan Foil M.D.   On: 06/06/2018 18:20   Dg Shoulder Left  Result Date: 06/06/2018 CLINICAL DATA:  With shoulder pain EXAM: LEFT SHOULDER - 2+ VIEW COMPARISON:  None. FINDINGS: AC joint is intact.  No fracture or dislocation. IMPRESSION: No acute osseous abnormality Electronically Signed   By: Donavan Foil M.D.   On: 06/06/2018 18:19   Dg Hip Unilat W Or Wo Pelvis 2-3 Views Left  Result Date: 06/06/2018 CLINICAL DATA:  Fall EXAM: DG HIP (WITH OR WITHOUT PELVIS)  2-3V LEFT COMPARISON:  CT 03/29/2018 FINDINGS: Acute mildly comminuted left intertrochanteric fracture. Left femoral head projects in joint. Mild arthritis of both hips. Pubic symphysis is intact IMPRESSION: Acute mildly comminuted left intertrochanteric fracture Electronically Signed   By: Donavan Foil M.D.   On: 06/06/2018 18:18    EKG: Independently reviewed. EKG with normal sinus rhythm, LVH, rate 82,  QTc 443, no concerning T wave inversions or ST elevation/depressions.  Assessment/Plan Principal Problem:   Closed left hip fracture, initial encounter [facility]) Active Problems:   Rheumatoid arthritis ([place])   Hyperlipidemia   Seizure disorder (HCC)   Essential hypertension   Gastric AVM   Hip fracture (HCC)  comminuted left intertrochanteric hip fracture Patient presenting after a mechanical fall on 06/05/2018 with decreased range of motion and persistent left hip pain.  X-ray notable for mildly comminuted left intratrochanteric hip fracture.  --Tx to [facility] per Dr. Lucia Gaskins --Orthopedics consulted, plan surgical intervention with cephalo-medullary nailing --Pain control with Norco/morphine --Check INR --N.p.o. after midnight  Chronic diastolic congestive heart failure TTE with EF greater than 65%.  Compensated.  Chest x-ray without vascular congestion. --Continue  to monitor daily weights/strict I's and O's  CAD status post PCI/stent Patient underwent PCI/stenting of her RCA on 02/28/2018 by Dr. Irish Lack.  Was originally on dual antiplatelet therapy with Brilinta and aspirin but subsequently developed an acute GI bleed.  Currently on Brilinta 90 mg p.o. twice daily. --Discussed with cardiology, Dr. Verne Grain the on-call cardiology fellow at [facility], will continue Brilinta given PCI/stent placed less than 6 months ago, and patient will be evaluated by the interventional cardiology team tomorrow morning for discussion of perioperative antiplatelet therapy. --Continue Coreg 25 mg p.o. twice daily  Rheumatoid arthritis Home regimen includes Plaquenil 200 mg p.o. daily and prednisone 5 mg p.o. daily. --Hold Plaquenil --Continue home prednisone 5 mg p.o. daily, no indication for stress dose steroids as chronic prednisone dose less than 10 mg daily.  History of GI bleed Following PCI/stenting of her RCA in February 2020, patient was started on dual antiplatelet therapy with aspirin and Brilinta.  However, patient developed an acute GI bleed.  She underwent EGD on 03/05/2018 that was notable for gastric AVM, s/p Clip.  Now patient is only on Brilinta outpatient. --Hemoglobin stable 11.6 --Repeat CBC in the a.m.  HLD: Continue Lipitor 80 mg p.o. daily  Hx seizure disorder: Continue Keppra 1000 mg p.o. daily  Preoperative risk assessment Patient is a 78 year old female with history of diastolic congestive heart failure, rheumatoid arthritis, CAD with recent stent in February 2020, HLD and history of gastric AVM who presents with an acute left hip fracture.  In regards to her diastolic heart failure she is compensated.  No history of stroke, not currently on insulin, with a creatinine of 1.48.  In accordance with the revised cardiac risk index, patient is a class II risk correlating to a 6% 30-day risk of death, MI, or cardiac arrest.  Given her severe  debility with this hip fracture as she was previously ambulatory, it is reasonable to proceed with a minimal/moderate risk surgical intervention in this patient.  No further testing required at this time.  Cardiology will evaluate patient in the a.m. regarding antiplatelet therapy in the perioperative setting.   DVT prophylaxis: Holding chemical DVT prophylaxis with likely need for surgical intervention Code Status: DNR Family Communication: updated granddaughter Deeandra by telephone Disposition Plan:  Consults called: Orthopedics/[place] by EDP Admission status: inpatient, med/surg   Eric J [place] DO  [facility] Pager [phone]  If 7PM-7AM, please contact night-coverage [email or web address] Password [facility]  06/06/2018, 7:27 PM

## 2018-06-06 NOTE — Chronic Care Management (AMB) (Addendum)
  Chronic Care Management    Clinical Social Work Follow Up Note  06/06/2018 Name: [name] MRN: [id number] DOB: 12-30-1940  [name] is a 78 y.o. year old female who is a primary care patient of Glendale Chard, MD. The CCM team was consulted for assistance with Community Resources and Level of Care Concerns.   Review of patient status, including review of consultants reports, other relevant assessments, and collaboration with appropriate care team members and the patient's provider was performed as part of comprehensive patient evaluation and provision of chronic care management services.     Goals Addressed            This Visit's Progress   . "I need help caring for my wife"   Not on track    Spouse states  Current Barriers:  [name] Knowledge Deficits related to potential complications secondary to immobility  . Lacks caregiver support  . Chronic Disease Management support and education needs related to how to provide total care, obtain DME and utilize the appropriate resources available  Nurse Case Manager Clinical Goal(s):  [name] Over the next 30 days, patient/spouse will verbalize understanding of plan for obtaining DME.  . Over the next 60 days, patient/spouse will verbalize understanding of Palliative Care Services.  . Over the next 90 days, patient will work with the CCM team to improve knowledge and understanding about potential complications associated with patient's impaired physical mobility including complications related to skin breakdown, and pneumonia, malnutrition and DVT.   CCM SW Interventions:  Completed 06/06/18  Collaboration with CCM RN Case Manager Glenard Haring Little who reports conversation with the patients spouse - Mr Heinzelman has indicated he is no longer interested in placement and plans to quit working at the end of the month to care for the patient. Mr Tukes further discloses his family will care for the patient in the interim  Outbound call to [place] to provide an update on the families plan to no longer place the patient  Determined [name] would follow up on APS regarding the families current caregiver plans  Scheduled outreach call to the patient and her spouse within the next 1-3 business days  Inbound call from [place] who reports the home health PTA found the patient home alone and in severe pain to her L side.   Received information from [name] that the home health PTA has dialed [phone] to assist with patient needs  Confirmed an APS referral has been completed on the patients behalf by Mrs Kennieth Francois with patients primary provider and CCM RN Case Manager  Patient Self Care Activities:  . Currently UNABLE TO independently participate in [place]  Please see past updates related to this goal by clicking on the "Past Updates" button in the selected goal           Follow Up Plan: SW will continue to follow   [name], BSW, CDP [facility] Management Social Worker [phone]  Total time spent performing care coordination and/or care management activities with the patient by phone or face to face = 30 minutes.

## 2018-06-07 ENCOUNTER — Encounter (HOSPITAL_COMMUNITY): Payer: Self-pay

## 2018-06-07 ENCOUNTER — Encounter (HOSPITAL_COMMUNITY)
Admission: EM | Disposition: A | Discharge status: Home Health | Payer: Self-pay | Source: Home / Self Care | Attending: Internal Medicine

## 2018-06-07 ENCOUNTER — Inpatient Hospital Stay (HOSPITAL_COMMUNITY): Payer: Medicare PPO | Admitting: Registered Nurse

## 2018-06-07 ENCOUNTER — Inpatient Hospital Stay (HOSPITAL_COMMUNITY): Payer: Medicare PPO

## 2018-06-07 ENCOUNTER — Telehealth: Payer: Self-pay

## 2018-06-07 DIAGNOSIS — I10 Essential (primary) hypertension: Secondary | ICD-10-CM

## 2018-06-07 DIAGNOSIS — E782 Mixed hyperlipidemia: Secondary | ICD-10-CM

## 2018-06-07 HISTORY — PX: INTRAMEDULLARY (IM) NAIL INTERTROCHANTERIC: SHX5875

## 2018-06-07 LAB — CBC
HCT: 33.2 % — ABNORMAL LOW (ref 36.0–46.0)
Hemoglobin: 10.7 g/dL — ABNORMAL LOW (ref 12.0–15.0)
MCH: 25.9 pg — ABNORMAL LOW (ref 26.0–34.0)
MCHC: 32.2 g/dL (ref 30.0–36.0)
MCV: 80.4 fL (ref 80.0–100.0)
Platelets: 279 10*3/uL (ref 150–400)
RBC: 4.13 MIL/uL (ref 3.87–5.11)
RDW: 18.6 % — ABNORMAL HIGH (ref 11.5–15.5)
WBC: 12.2 10*3/uL — ABNORMAL HIGH (ref 4.0–10.5)
nRBC: 0 % (ref 0.0–0.2)

## 2018-06-07 LAB — BASIC METABOLIC PANEL
Anion gap: 11 (ref 5–15)
BUN: 22 mg/dL (ref 8–23)
CO2: 21 mmol/L — ABNORMAL LOW (ref 22–32)
Calcium: 8.6 mg/dL — ABNORMAL LOW (ref 8.9–10.3)
Chloride: 109 mmol/L (ref 98–111)
Creatinine, Ser: 1.63 mg/dL — ABNORMAL HIGH (ref 0.44–1.00)
GFR calc Af Amer: 34 mL/min — ABNORMAL LOW (ref >60)
GFR calc non Af Amer: 30 mL/min — ABNORMAL LOW (ref >60)
Glucose, Bld: 222 mg/dL — ABNORMAL HIGH (ref 70–99)
Potassium: 4.4 mmol/L (ref 3.5–5.1)
Sodium: 141 mmol/L (ref 135–145)

## 2018-06-07 LAB — TYPE AND SCREEN
ABO/RH(D): B NEG
Antibody Screen: NEGATIVE

## 2018-06-07 LAB — SURGICAL PCR SCREEN
MRSA, PCR: NEGATIVE
Staphylococcus aureus: NEGATIVE

## 2018-06-07 LAB — PROTIME-INR
INR: 1.3 — ABNORMAL HIGH (ref 0.8–1.2)
Prothrombin Time: 15.7 seconds — ABNORMAL HIGH (ref 11.4–15.2)

## 2018-06-07 SURGERY — FIXATION, FRACTURE, INTERTROCHANTERIC, WITH INTRAMEDULLARY ROD
Anesthesia: General | Site: Leg Upper | Laterality: Left

## 2018-06-07 MED ORDER — ONDANSETRON HCL 4 MG/2ML IJ SOLN
INTRAMUSCULAR | Status: AC
  Filled 2018-06-07: qty 2

## 2018-06-07 MED ORDER — POVIDONE-IODINE 10 % EX SWAB: 2.0000 "application " | Freq: Once | CUTANEOUS | Status: DC

## 2018-06-07 MED ORDER — EPHEDRINE SULFATE-NACL 50-0.9 MG/10ML-% IV SOSY
PREFILLED_SYRINGE | INTRAVENOUS | Status: DC | PRN
  Administered 2018-06-07: 10 mg via INTRAVENOUS

## 2018-06-07 MED ORDER — ONDANSETRON HCL 4 MG/2ML IJ SOLN: 4.0000 mg | Freq: Once | INTRAMUSCULAR | Status: DC | PRN

## 2018-06-07 MED ORDER — LIDOCAINE 2% (20 MG/ML) 5 ML SYRINGE
INTRAMUSCULAR | Status: AC
  Filled 2018-06-07: qty 5

## 2018-06-07 MED ORDER — LIDOCAINE 2% (20 MG/ML) 5 ML SYRINGE
INTRAMUSCULAR | Status: DC | PRN
  Administered 2018-06-07: 40 mg via INTRAVENOUS

## 2018-06-07 MED ORDER — MIDAZOLAM HCL 2 MG/2ML IJ SOLN
INTRAMUSCULAR | Status: AC
  Filled 2018-06-07: qty 2

## 2018-06-07 MED ORDER — DEXAMETHASONE SODIUM PHOSPHATE 10 MG/ML IJ SOLN
INTRAMUSCULAR | Status: DC | PRN
  Administered 2018-06-07: 5 mg via INTRAVENOUS

## 2018-06-07 MED ORDER — FENTANYL CITRATE (PF) 100 MCG/2ML IJ SOLN: 25.0000 ug | INTRAMUSCULAR | Status: DC | PRN

## 2018-06-07 MED ORDER — ACETAMINOPHEN 10 MG/ML IV SOLN
INTRAVENOUS | Status: DC | PRN
  Administered 2018-06-07: 1000 mg via INTRAVENOUS

## 2018-06-07 MED ORDER — PROPOFOL 10 MG/ML IV BOLUS
INTRAVENOUS | Status: AC
  Filled 2018-06-07: qty 20

## 2018-06-07 MED ORDER — ACETAMINOPHEN 10 MG/ML IV SOLN
INTRAVENOUS | Status: AC
  Filled 2018-06-07: qty 100

## 2018-06-07 MED ORDER — SODIUM CHLORIDE 0.9 % IV SOLN
INTRAVENOUS | Status: DC | PRN
  Administered 2018-06-07: 35 ug/min via INTRAVENOUS

## 2018-06-07 MED ORDER — FENTANYL CITRATE (PF) 250 MCG/5ML IJ SOLN
INTRAMUSCULAR | Status: AC
  Filled 2018-06-07: qty 5

## 2018-06-07 MED ORDER — FENTANYL CITRATE (PF) 100 MCG/2ML IJ SOLN
INTRAMUSCULAR | Status: DC | PRN
  Administered 2018-06-07: 50 ug via INTRAVENOUS

## 2018-06-07 MED ORDER — ONDANSETRON HCL 4 MG/2ML IJ SOLN
INTRAMUSCULAR | Status: DC | PRN
  Administered 2018-06-07: 4 mg via INTRAVENOUS

## 2018-06-07 MED ORDER — ENSURE ENLIVE PO LIQD
237.0000 mL | Freq: Every day | ORAL | Status: DC
  Administered 2018-06-07 – 2018-06-11 (×5): 237 mL via ORAL

## 2018-06-07 MED ORDER — PROPOFOL 10 MG/ML IV BOLUS
INTRAVENOUS | Status: DC | PRN
  Administered 2018-06-07: 60 mg via INTRAVENOUS

## 2018-06-07 MED ORDER — ENSURE PRE-SURGERY PO LIQD
296.0000 mL | Freq: Once | ORAL | Status: AC
  Administered 2018-06-07: 296 mL via ORAL
  Filled 2018-06-07: qty 296

## 2018-06-07 MED ORDER — CEFAZOLIN SODIUM-DEXTROSE 2-4 GM/100ML-% IV SOLN
2.0000 g | INTRAVENOUS | Status: AC
  Administered 2018-06-07: 2 g via INTRAVENOUS
  Filled 2018-06-07: qty 100

## 2018-06-07 MED ORDER — LACTATED RINGERS IV SOLN
INTRAVENOUS | Status: DC
  Administered 2018-06-07 – 2018-06-08 (×3): via INTRAVENOUS

## 2018-06-07 MED ORDER — OXYCODONE HCL 5 MG PO TABS: 5.0000 mg | ORAL_TABLET | Freq: Once | ORAL | Status: DC | PRN

## 2018-06-07 MED ORDER — PHENYLEPHRINE 40 MCG/ML (10ML) SYRINGE FOR IV PUSH (FOR BLOOD PRESSURE SUPPORT)
PREFILLED_SYRINGE | INTRAVENOUS | Status: AC
  Filled 2018-06-07: qty 10

## 2018-06-07 MED ORDER — ADULT MULTIVITAMIN W/MINERALS CH
1.0000 | ORAL_TABLET | Freq: Every day | ORAL | Status: DC
  Administered 2018-06-08 – 2018-06-11 (×4): 1 via ORAL
  Filled 2018-06-07 (×4): qty 1

## 2018-06-07 MED ORDER — OXYCODONE HCL 5 MG/5ML PO SOLN: 5.0000 mg | Freq: Once | ORAL | Status: DC | PRN

## 2018-06-07 MED ORDER — CEFAZOLIN SODIUM-DEXTROSE 1-4 GM/50ML-% IV SOLN
1.0000 g | Freq: Two times a day (BID) | INTRAVENOUS | Status: AC
  Administered 2018-06-07 – 2018-06-08 (×2): 1 g via INTRAVENOUS
  Filled 2018-06-07 (×2): qty 50

## 2018-06-07 MED ORDER — DEXAMETHASONE SODIUM PHOSPHATE 10 MG/ML IJ SOLN
INTRAMUSCULAR | Status: AC
  Filled 2018-06-07: qty 1

## 2018-06-07 MED ORDER — PHENYLEPHRINE 40 MCG/ML (10ML) SYRINGE FOR IV PUSH (FOR BLOOD PRESSURE SUPPORT)
PREFILLED_SYRINGE | INTRAVENOUS | Status: DC | PRN
  Administered 2018-06-07: 80 ug via INTRAVENOUS
  Administered 2018-06-07: 120 ug via INTRAVENOUS
  Administered 2018-06-07: 80 ug via INTRAVENOUS
  Administered 2018-06-07: 120 ug via INTRAVENOUS

## 2018-06-07 MED ORDER — 0.9 % SODIUM CHLORIDE (POUR BTL) OPTIME
TOPICAL | Status: DC | PRN
  Administered 2018-06-07: 10:00:00 1000 mL

## 2018-06-07 SURGICAL SUPPLY — 47 items
ADH SKN CLS APL DERMABOND .7 (GAUZE/BANDAGES/DRESSINGS) ×1
BIT DRILL 4.3MMS DISTAL GRDTED (BIT) ×1 IMPLANT
CHLORAPREP W/TINT 26ML (MISCELLANEOUS) ×3 IMPLANT
COVER PERINEAL POST (MISCELLANEOUS) ×3 IMPLANT
COVER SURGICAL LIGHT HANDLE (MISCELLANEOUS) ×3 IMPLANT
COVER WAND RF STERILE (DRAPES) IMPLANT
DERMABOND ADVANCED (GAUZE/BANDAGES/DRESSINGS) ×2
DERMABOND ADVANCED .7 DNX12 (GAUZE/BANDAGES/DRESSINGS) ×1 IMPLANT
DRAPE C-ARM 35X43 STRL (DRAPES) ×3 IMPLANT
DRAPE HALF SHEET 40X57 (DRAPES) ×3 IMPLANT
DRAPE IMP U-DRAPE 54X76 (DRAPES) ×6 IMPLANT
DRAPE INCISE IOBAN 66X45 STRL (DRAPES) ×3 IMPLANT
DRAPE STERI IOBAN 125X83 (DRAPES) ×3 IMPLANT
DRAPE SURG 17X23 STRL (DRAPES) ×6 IMPLANT
DRAPE U-SHAPE 47X51 STRL (DRAPES) ×3 IMPLANT
DRILL 4.3MMS DISTAL GRADUATED (BIT) ×3
DRSG MEPILEX BORDER 4X4 (GAUZE/BANDAGES/DRESSINGS) ×12 IMPLANT
DRSG MEPILEX BORDER 4X8 (GAUZE/BANDAGES/DRESSINGS) ×3 IMPLANT
ELECT REM PT RETURN 9FT ADLT (ELECTROSURGICAL) ×3
ELECTRODE REM PT RTRN 9FT ADLT (ELECTROSURGICAL) ×1 IMPLANT
GLOVE BIO SURGEON STRL SZ 6.5 (GLOVE) ×6 IMPLANT
GLOVE BIO SURGEON STRL SZ7.5 (GLOVE) ×12 IMPLANT
GLOVE BIO SURGEONS STRL SZ 6.5 (GLOVE) ×3
GLOVE BIOGEL PI IND STRL 6.5 (GLOVE) ×1 IMPLANT
GLOVE BIOGEL PI IND STRL 7.5 (GLOVE) ×1 IMPLANT
GLOVE BIOGEL PI INDICATOR 6.5 (GLOVE) ×2
GLOVE BIOGEL PI INDICATOR 7.5 (GLOVE) ×2
GOWN STRL REUS W/ TWL LRG LVL3 (GOWN DISPOSABLE) ×1 IMPLANT
GOWN STRL REUS W/TWL LRG LVL3 (GOWN DISPOSABLE) ×3
GUIDEPIN 3.2X17.5 THRD DISP (PIN) ×3 IMPLANT
GUIDEWIRE BALL NOSE 80CM (WIRE) ×3 IMPLANT
KIT BASIN OR (CUSTOM PROCEDURE TRAY) ×3 IMPLANT
KIT TURNOVER KIT B (KITS) ×3 IMPLANT
MANIFOLD NEPTUNE II (INSTRUMENTS) ×3 IMPLANT
NAIL HIP FRA AFFIX 130X9X340 L (Nail) ×3 IMPLANT
NS IRRIG 1000ML POUR BTL (IV SOLUTION) ×3 IMPLANT
PACK GENERAL/GYN (CUSTOM PROCEDURE TRAY) ×3 IMPLANT
PAD ARMBOARD 7.5X6 YLW CONV (MISCELLANEOUS) ×6 IMPLANT
SCREW BONE CORTICAL 5.0X38 (Screw) ×3 IMPLANT
SCREW LAG HIP NAIL 10.5X95 (Screw) ×3 IMPLANT
SUT MNCRL AB 3-0 PS2 18 (SUTURE) ×3 IMPLANT
SUT VIC AB 0 CT1 27 (SUTURE)
SUT VIC AB 0 CT1 27XBRD ANBCTR (SUTURE) IMPLANT
SUT VIC AB 2-0 CT1 27 (SUTURE) ×6
SUT VIC AB 2-0 CT1 TAPERPNT 27 (SUTURE) ×2 IMPLANT
TOWEL OR 17X26 10 PK STRL BLUE (TOWEL DISPOSABLE) ×6 IMPLANT
WATER STERILE IRR 1000ML POUR (IV SOLUTION) ×3 IMPLANT

## 2018-06-07 NOTE — Evaluation (Signed)
Physical Therapy Evaluation Patient Details Name: [name] MRN: [id number] DOB: [birth year] Today's Date: 06/07/2018   History of Present Illness  Pt is a 78 y.o. F with significant PMH of diastolic congestive heart failure, rheumatoid arthritis, CAD s/p PCI/stent, history of GI bleed who presents with complaints of left hip and shoulder pain following a fall. Found to have comminuted left intertrochanteric hip fx, no s/p cephalo medullary nail.  Clinical Impression  Pt admitted with above. Prior to admission, pt lives with her spouse who works, requires assist for ADL's, and is a limited household ambulator using a walker. On PT evaluation, pt extremely limited by left hip pain (pt premedicated just prior to session). Requiring total assist for all aspects of bed mobility. Pt is unable to stand. Presents with decreased mobility secondary to frailty, left leg weakness, pain, decreased range of motion, decreased cognition, and balance impairments. Presents as high risk for falls based on above deficits and history of falls. Recommending SNF to maximize functional independence and decrease caregiver burden.    Follow Up Recommendations SNF;Supervision/Assistance - 24 hour    Equipment Recommendations  Other (comment)(defer)    Recommendations for Other Services OT consult     Precautions / Restrictions Precautions Precautions: Fall Restrictions Weight Bearing Restrictions: No      Mobility  Bed Mobility Overal bed mobility: Needs Assistance Bed Mobility: Supine to Sit;Sit to Supine     Supine to sit: Total assist Sit to supine: Total assist   General bed mobility comments: TotalA for all aspects of bed mobility, no initiation noted from patient  Transfers                 General transfer comment: Unable  Ambulation/Gait                Stairs            Wheelchair Mobility    Modified Rankin (Stroke Patients Only)       Balance Overall  balance assessment: Needs assistance;History of Falls Sitting-balance support: Feet supported Sitting balance-Leahy Scale: Fair Sitting balance - Comments: Supervision-min guard assist for static sitting balance                                     Pertinent Vitals/Pain Pain Assessment: Faces Faces Pain Scale: Hurts whole lot Pain Location: left hip Pain Descriptors / Indicators: Grimacing;Operative site guarding;Moaning Pain Intervention(s): Limited activity within patient's tolerance;Monitored during session;Premedicated before session    Home Living Family/patient expects to be discharged to:: Private residence Living Arrangements: Spouse/significant other Available Help at Discharge: Family;Available PRN/intermittently Type of Home: House Home Access: Stairs to enter Entrance Stairs-Rails: Right Entrance Stairs-Number of Steps: 3 Home Layout: One level Home Equipment: Walker - 2 wheels;Cane - single point;Bedside commode Additional Comments: spouse works until 3 everyday    Prior Function Level of Independence: Needs assistance   Gait / Transfers Assistance Needed: ambulates with RW  ADL's / Homemaking Assistance Needed: requires assistance for ADLs/IADLs        Hand Dominance   Dominant Hand: Right    Extremity/Trunk Assessment   Upper Extremity Assessment Upper Extremity Assessment: Generalized weakness    Lower Extremity Assessment Lower Extremity Assessment: LLE deficits/detail;Generalized weakness LLE Deficits / Details: s/p nail of left intertrochanteric hip fx. Grossly ~1/5, able to wiggle toes, limited by pain       Communication   Communication: No difficulties  Cognition Arousal/Alertness: Awake/alert Behavior During Therapy: Flat affect Overall Cognitive Status: Impaired/Different from baseline Area of Impairment: Orientation;Memory;Following commands;Awareness                 Orientation Level: Disoriented to;Situation    Memory: Decreased short-term memory Following Commands: Follows one step commands inconsistently   Awareness: Intellectual   General Comments: Pt not able to recall she had surgery, following one step commands ~75% of the time. Very quiet with minimal interaction or verbalizations with therapist      General Comments      Exercises     Assessment/Plan    PT Assessment Patient needs continued PT services  PT Problem List Decreased range of motion;Decreased strength;Decreased activity tolerance;Decreased balance;Decreased mobility;Decreased cognition;Pain       PT Treatment Interventions DME instruction;Gait training;Functional mobility training;Therapeutic activities;Therapeutic exercise;Balance training;Patient/family education;Wheelchair mobility training    PT Goals (Current goals can be found in the Care Plan section)  Acute Rehab PT Goals Patient Stated Goal: none stated PT Goal Formulation: With patient Time For Goal Achievement: 06/21/18 Potential to Achieve Goals: Fair    Frequency Min 3X/week   Barriers to discharge Decreased caregiver support      Co-evaluation               AM-PAC PT "6 Clicks" Mobility  Outcome Measure Help needed turning from your back to your side while in a flat bed without using bedrails?: Total Help needed moving from lying on your back to sitting on the side of a flat bed without using bedrails?: Total Help needed moving to and from a bed to a chair (including a wheelchair)?: Total Help needed standing up from a chair using your arms (e.g., wheelchair or bedside chair)?: Total Help needed to walk in hospital room?: Total Help needed climbing 3-5 steps with a railing? : Total 6 Click Score: 6    End of Session   Activity Tolerance: Patient limited by pain Patient left: in bed;with call bell/phone within reach;with bed alarm set Nurse Communication: Mobility status PT Visit Diagnosis: Other abnormalities of gait and mobility  (R26.89);Muscle weakness (generalized) (M62.81);History of falling (Z91.81);Pain Pain - Right/Left: Left Pain - part of body: Hip    Time: 0301-3143 PT Time Calculation (min) (ACUTE ONLY): 32 min   Charges:   PT Evaluation $PT Eval Moderate Complexity: 1 Mod PT Treatments $Therapeutic Activity: 8-22 mins        [name], PT, DPT Acute Rehabilitation Services Pager [phone] Office [phone]   [name] 06/07/2018, 4:46 PM

## 2018-06-07 NOTE — Consult Note (Addendum)
The patient has been seen in conjunction with Melina Copa, PA-C. All aspects of care have been considered and discussed. The patient has been personally interviewed, examined, and all clinical data has been reviewed.   Reviewed digital images from intervention in February.  Unfortunately, the right coronary stent was quite long.  We should continue antiplatelet therapy.  Would prefer to continue Brilinta, and if no obvious evidence of bleeding currently, we will resume this evening.  Monotherapy with Brilinta seems to be working well at this time and therefore, I will not make any drastic changes.  Monitor hemoglobin and hip closely for evidence of bleeding problems.  If bleeding, will need to switch to Plavix.   Cardiology Consultation:   Patient ID: [name]; [id number]; [birth year]   Admit date: 06/06/2018 Date of Consult: 06/07/2018  Primary Care Provider: Glendale Chard, MD Primary Cardiologist: Larae Grooms, MD Primary Electrophysiologist:  None  Chief Complaint: fall  Patient Profile:   [name] is a 78 y.o. 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 [place]  History of Present Illness:   In 02/2018 she presented to [facility] with episode of fall, vomiting and unresponsiveness with hypotension. She was intubated in the ED due to vomiting and AMS. She was found to have an inferior STEMI and underwent cath with significant RCA disease treated with PCI/DES. LVEF was >65%, with severe basal septal hypertrophy of the septal wall, dynamic mid cavity LV obstruction at rest, moderate MR (cannot r/o  myxomatous degeneration), mild TR. Hospitalization was complicated by AKI on CKD as well as ABL anemia/GIB requiring transfusion and endoscopy showing gastric AVM s/p clipping. She was discharged on Brilinta monotherapy. SNF was suggested but she declined. She has had failure to thrive since that time. She was in the hospital in 04/2018 with acute on chronic diastolic CHF/encephalopathy and hypoxia requiring diuresis. She has not been to our office before - appointment had been scheduled at one point in March, but family cancelled due to pt being recurrently readmitted and does not appear they called back.  She presented back to the hospital yesterday having sustained a mechanical fall. Patient is currently in OR, so chart prepped remotely by APP to decrease contact exposure during Covid pandemic. Chart review indicates patient was trying to go to the trash can and fell against a refrigerator. It was clearly outlined this was not syncope. Hip film showed acute mildly comminuted left intratrochanteric fracture. Covid testing was negative. Admitting team spoke with overnight fellow regarding antiplatelet therapy who indicated since she was <6 months out from stenting that Casey would need to be continued. Cardiology consulted for input on antiplatelet therapy perioperatively. She has been taken to the OR prior to our consultation.      Past Medical History:  Diagnosis Date  . Acute kidney injury superimposed on chronic kidney disease ([place]) 02/06/2015  . Anxiety   . AVM (arteriovenous malformation)   . CAD (coronary artery disease) 02/28/2018   a. 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  . Chronic diastolic CHF (congestive heart failure) ([place])   . Depression   . DVT (deep venous thrombosis) ([place])  1970s   LLE  . GI bleed 02/2018   a. 02/2018 - ABL anemia/GIB - > endoscopy with gastric AVMs s/p clipping.  [name] Heart murmur   . Hypercholesterolemia   . Hypertension   . Hypertrophic cardiomyopathy  ([place])    a. severe basal septal hypertrophy by echo 02/2018 with dynamic mid cavity gradient at rest.  . Kidney stones   . Migraine    "a few/year" (07/16/2015)  . Moderate mitral regurgitation   . Multiple falls   . Muscular deconditioning   . Pneumonia "several times"  . Rheumatoid arthritis(714.0)    "all over" (07/16/2015)  . Seizure [facility])    "last one was 1//2017" (07/16/2015)  . Syncope and collapse   . Vision abnormalities     Past Surgical History:  Procedure Laterality Date  . ABDOMINAL HYSTERECTOMY    . CARPAL TUNNEL RELEASE Bilateral   . CATARACT EXTRACTION W/ INTRAOCULAR LENS  IMPLANT, BILATERAL Bilateral   . CORONARY/GRAFT ACUTE MI REVASCULARIZATION N/A 02/28/2018   Procedure: Coronary/Graft Acute MI Revascularization;  Surgeon: Jettie Booze, MD;  Location: [place] CV LAB;  Service: Cardiovascular;  Laterality: N/A;  . ESOPHAGOGASTRODUODENOSCOPY N/A 03/05/2018   Procedure: ESOPHAGOGASTRODUODENOSCOPY (EGD);  Surgeon: Ladene Artist, MD;  Location: [facility] ENDOSCOPY;  Service: Endoscopy;  Laterality: N/A;  . LEFT HEART CATH AND CORONARY ANGIOGRAPHY N/A 02/28/2018   Procedure: LEFT HEART CATH AND CORONARY ANGIOGRAPHY;  Surgeon: Jettie Booze, MD;  Location: [place] CV LAB;  Service: Cardiovascular;  Laterality: N/A;  . NECK EXPLORATION  07/16/2015  . PARATHYROIDECTOMY Right 07/16/2015   superior  . PARATHYROIDECTOMY Right 07/16/2015   Procedure: RIGHT INFERIOR PARATHYROIDECTOMY;  Surgeon: Armandina Gemma, MD;  Location: [place];  Service: General;  Laterality: Right;  . SUBMUCOSAL INJECTION  03/05/2018   Procedure: SUBMUCOSAL INJECTION;  Surgeon: Ladene Artist, MD;  Location: [facility] ENDOSCOPY;  Service: Endoscopy;;  . TOENAIL AVULSION Bilateral    great toe     Inpatient Medications: Scheduled Meds: . [MAR Hold] atorvastatin  80 mg Oral q1800  . [MAR Hold] carvedilol  25 mg Oral BID WC  . feeding supplement (ENSURE ENLIVE)  237 mL Oral Q1500  . [MAR Hold]  furosemide  40 mg Oral Daily  . [MAR Hold] iron polysaccharides  150 mg Oral Daily  . [MAR Hold] levETIRAcetam  1,000 mg Oral Daily  . [MAR Hold] mirtazapine  15 mg Oral QHS  . [START ON 06/08/2018] multivitamin with minerals  1 tablet Oral Daily  . [MAR Hold] pantoprazole  40 mg Oral Daily  . povidone-iodine  2 application Topical Once  . [MAR Hold] predniSONE  5 mg Oral Q breakfast  . [MAR Hold] ticagrelor  90 mg Oral BID   Continuous Infusions: . sodium chloride 75 mL/hr at 06/07/18 0039  . lactated ringers 50 mL/hr at 06/07/18 0839   PRN Meds: 0.9 % irrigation (POUR BTL), [MAR Hold] HYDROcodone-acetaminophen, [MAR Hold]  morphine injection, [MAR Hold] ondansetron (ZOFRAN) IV, [MAR Hold] senna-docusate  Home Meds: Prior to Admission medications   Medication Sig Start Date End Date Taking? Authorizing Provider  acetaminophen (TYLENOL) 325 MG tablet Take 2 tablets (650 mg total) by mouth every 6 (six) hours as needed for mild pain (or Fever >/= 101). 04/03/18  Yes Debbe Odea, MD  atorvastatin (LIPITOR) 80 MG tablet Take 1 tablet (80 mg total) by mouth daily at 6 PM. 06/04/18  Yes Glendale Chard, MD  carvedilol (COREG) 12.5 MG tablet Take 2 tablets (25 mg total)  by mouth 2 (two) times daily with a meal. 04/26/18  Yes Ghimire, Henreitta Leber, MD  Cholecalciferol (VITAMIN D) 125 MCG (5000 UT) CAPS Take 1 capsule by mouth daily.   Yes [provider]  hydroxychloroquine (PLAQUENIL) 200 MG tablet Take 200 mg by mouth daily.   Yes [provider]  lactose free nutrition (BOOST PLUS) LIQD Take 237 mLs by mouth 3 (three) times daily between meals. Patient taking differently: Take 237 mLs by mouth daily.  04/03/18  Yes Debbe Odea, MD  levETIRAcetam (KEPPRA) 500 MG tablet TAKE 2 TABLETS BY MOUTH EVERY DAY Patient taking differently: Take 1,000 mg by mouth daily.  03/22/18  Yes Glendale Chard, MD  ondansetron (ZOFRAN) 4 MG tablet Take 1 tablet (4 mg total) by mouth every 6 (six) hours  as needed for nausea. 04/03/18  Yes Debbe Odea, MD  pantoprazole (PROTONIX) 40 MG tablet Take 1 tablet (40 mg total) by mouth daily. 06/04/18  Yes Glendale Chard, MD  ticagrelor (BRILINTA) 90 MG TABS tablet Take 1 tablet (90 mg total) by mouth 2 (two) times daily. 03/08/18  Yes Bhagat, Bhavinkumar, PA  furosemide (LASIX) 40 MG tablet Take 1 tablet (40 mg total) by mouth daily. Patient not taking: Reported on 06/06/2018 04/26/18   Jonetta Osgood, MD  iron polysaccharides (NIFEREX) 150 MG capsule Take 1 capsule (150 mg total) by mouth daily. Patient not taking: Reported on 06/06/2018 04/04/18   Debbe Odea, MD  nitroGLYCERIN (NITROSTAT) 0.4 MG SL tablet Place 1 tablet (0.4 mg total) under the tongue every 5 (five) minutes as needed. Patient taking differently: Place 0.4 mg under the tongue every 5 (five) minutes as needed for chest pain.  03/08/18   Jettie Booze, MD  predniSONE (DELTASONE) 5 MG tablet Take 1 tablet (5 mg total) by mouth daily with breakfast. Patient not taking: Reported on 06/06/2018 04/01/18   Bo Merino, MD  Vitamin D, Ergocalciferol, (DRISDOL) 50000 units CAPS capsule Take 1 capsule (50,000 Units total) by mouth every 7 (seven) days. Patient not taking: Reported on 06/06/2018 08/14/16   Modena Jansky, MD    Allergies:   No Known Allergies  Social History:   Social History   Socioeconomic History  . Marital status: Married    Spouse name: Not on file  . Number of children: Not on file  . Years of education: Not on file  . Highest education level: Not on file  Occupational History  . Occupation: retired  Scientific laboratory technician  . Financial resource strain: Somewhat hard  . Food insecurity:    Worry: Never true    Inability: Never true  . Transportation needs:    Medical: Yes    Non-medical: No  Tobacco Use  . Smoking status: Former Smoker    Packs/day: 1.00    Years: 62.00    Pack years: 62.00    Types: Cigarettes    Last attempt to quit: 01/2018     Years since quitting: 0.3  . Smokeless tobacco: Never Used  Substance and Sexual Activity  . Alcohol use: No  . Drug use: No  . Sexual activity: Never  Lifestyle  . Physical activity:    Days per week: Not on file    Minutes per session: Not on file  . Stress: Not on file  Relationships  . Social connections:    Talks on phone: Not on file    Gets together: Not on file    Attends religious service: Not on file  Active member of club or organization: Not on file    Attends meetings of clubs or organizations: Not on file    Relationship status: Not on file  . Intimate partner violence:    Fear of current or ex partner: Not on file    Emotionally abused: Not on file    Physically abused: Not on file    Forced sexual activity: Not on file  Other Topics Concern  . Not on file  Social History Narrative  . Not on file    Family History:   The patient's family history includes Cancer in her brother and father; Heart disease in her brother and mother; Miscarriages / [place] in her brother; Pneumonia in her mother; Prostate cancer in her father.  ROS:  Please see the history of present illness. All other ROS reviewed and negative - pending MD review.     Physical Exam/Data:   Vitals:   06/06/18 2120 06/06/18 2300 06/07/18 0048 06/07/18 0812  BP: 125/76 126/68  (!) 179/89  Pulse: 80 78  86  Resp: 16 14    Temp:  98.4 F (36.9 C)    TempSrc:  Oral    SpO2: 98% 99%  100%  Weight:   48.4 kg   Height:   5\' 1"  (1.549 m)     Intake/Output Summary (Last 24 hours) at 06/07/2018 1027 Last data filed at 06/07/2018 0423 Gross per 24 hour  Intake 486 ml  Output 0 ml  Net 486 ml   Last 3 Weights 06/07/2018 05/22/2018 04/26/2018  Weight (lbs) 106 lb 11.2 oz (No Data) 106 lb 0.7 oz  Weight (kg) 48.4 kg (No Data) 48.1 kg    Body mass index is 20.16 kg/m.   EXAM:  [name] African-American female.  Mild pallor is noted. No spontaneous conversation. No jaundice is noted  on HEENT exam. Neck veins are flat. Chest is clear to auscultation and percussion. 2/6 to 3/6 crescendo decrescendo systolic murmur compatible with patient's known diagnosis of LV outflow obstruction/dynamic. Abdomen is soft. Left hip does not reveal evidence of hematoma, bruising, or bleeding. Neurological exam reveals no focal motor deficits.  Decreased memory.   EKG:  The EKG was personally reviewed and demonstrates NSR 82bpm, LVH, nonspecific ST upsloping I, II, III, avF, anterior Q waves possibly due to LVH  Relevant CV Studies: Cath 02/2018 Conclusion     Prox RCA lesion is 100% stenosed.  A drug-eluting stent was successfully placed using a STENT SYNERGY DES 2.5X38, postdilated to > 3.0 mm.  Post intervention, there is a 0% residual stenosis.  2nd Diag lesion is 40% stenosed.  Ost 2nd Diag lesion is 50% stenosed.  The left ventricular systolic function is normal.  LV end diastolic pressure is normal.  The left ventricular ejection fraction is 55-65% by visual estimate.  There is no aortic valve stenosis.  A drug-eluting stent was successfully placed using a STENT SYNERGY DES 2.5X38.   Continue dual antiplatelet therapy along with aggressive secondary prevention.  Hopefully, she can be extubated later today.    I anticipate she will be in the hospital for 2-3 days.  Check echocardiogram to assess for mitral regurgitation.   Hematoma noted in the right wrist/forearm.  All anticoagulation has been stopped.  Continue compression.        Laboratory Data:  Chemistry Recent Labs  Lab 06/06/18 1849 06/07/18 0143  NA 142 141  K 4.8 4.4  CL 110 109  CO2 22 21*  GLUCOSE 96 222*  BUN 24* 22  CREATININE 1.48* 1.63*  CALCIUM 8.9 8.6*  GFRNONAA 33* 30*  GFRAA 39* 34*  ANIONGAP 10 11  Hematology Recent Labs  Lab 06/06/18 1849 06/07/18 0143  WBC 12.3* 12.2*  RBC 4.49 4.13  HGB 11.6* 10.7*  HCT 37.5 33.2*  MCV 83.5 80.4  MCH 25.8* 25.9*  MCHC 30.9  32.2  RDW 18.9* 18.6*  PLT 311 279    Radiology/Studies:  Dg Chest 1 View  Result Date: 06/06/2018 CLINICAL DATA:  Fall with hip fracture EXAM: CHEST  1 VIEW COMPARISON:  04/22/2018 FINDINGS: No acute opacity or pleural effusion. Stable cardiomediastinal silhouette with aortic atherosclerosis. No pneumothorax. IMPRESSION: No active disease. Electronically Signed   By: Donavan Foil M.D.   On: 06/06/2018 18:20   Dg Shoulder Left  Result Date: 06/06/2018 CLINICAL DATA:  With shoulder pain EXAM: LEFT SHOULDER - 2+ VIEW COMPARISON:  None. FINDINGS: AC joint is intact.  No fracture or dislocation. IMPRESSION: No acute osseous abnormality Electronically Signed   By: Donavan Foil M.D.   On: 06/06/2018 18:19   Dg Hip Unilat W Or Wo Pelvis 2-3 Views Left  Result Date: 06/06/2018 CLINICAL DATA:  Fall EXAM: DG HIP (WITH OR WITHOUT PELVIS) 2-3V LEFT COMPARISON:  CT 03/29/2018 FINDINGS: Acute mildly comminuted left intertrochanteric fracture. Left femoral head projects in joint. Mild arthritis of both hips. Pubic symphysis is intact IMPRESSION: Acute mildly comminuted left intertrochanteric fracture Electronically Signed   By: Donavan Foil M.D.   On: 06/06/2018 18:18    Assessment and Plan:   1. Fall with intertrochanteric fracture - fall reported as mechanical, no syncope. She has hx of several falls. SNF previously recommended but patient declined. Need to reconsider going forward.  2. CAD with recent inferior MI, PCI- will review recommendations for antiplatelet therapy with interventionalist Dr. Tamala Julian.  3. Anemia - previous GIB, s/p clipping of AVM. Slight downtrend of Hgb noted today, possibly related to acute injury. Will need to be trended. Overall, fortunately improved since 04/2018.  4. CKD stage III - appears at baseline.  5. Chronic diastolic CHF in the context of hypertrophic cardiomyopathy - continue home med regimen and follow clinically. Judicious use recommended with IV fluids  post-operatively. Would suggest daily weights and strict I/O's.  For questions or updates, please contact [place] Please consult [email or web address] for contact info under Cardiology/STEMI.   Patient is currently in OR, so chart prepped remotely by APP to decrease contact exposure during Covid pandemic. MD will follow to converse/examine patient once out of surgery.  Signed, Charlie Pitter, PA-C  06/07/2018 10:27 AM

## 2018-06-07 NOTE — Social Work (Signed)
CSW acknowledging consult for SNF placement. Will follow for therapy recommendations. Spoke with [name], CSW that works with pt in the community. Pt has an open APS report from [facility] who has concerns with pt being left alone as pt spouse works until [employer]. Medicaid application is said to have been applied for but there is no documentation that pt has been approved.   Will follow.    Westley Hummer, MSW, [place] [employer] [phone]

## 2018-06-07 NOTE — Progress Notes (Signed)
Initial Nutrition Assessment  RD working remotely.  DOCUMENTATION CODES:   Not applicable  INTERVENTION:   -Once diet is advanced, add:   -Ensure Enlive po daily, each supplement provides 350 kcal and 20 grams of protein -MVI with minerals daily  NUTRITION DIAGNOSIS:   Increased nutrient needs related to post-op healing as evidenced by estimated needs.  GOAL:   Patient will meet greater than or equal to 90% of their needs  MONITOR:   PO intake, Supplement acceptance, Diet advancement, Labs, Weight trends, Skin, I & O's  REASON FOR ASSESSMENT:   Malnutrition Screening Tool, Consult Assessment of nutrition requirement/status, Hip fracture protocol  ASSESSMENT:  [name] is a 78 y.o. female with medical history significant of diastolic congestive heart failure, rheumatoid arthritis, CAD status post PCI/stent, HLD, history of GI bleed with gastric AVM who presents to [facility] long ED with complaints of left hip and shoulder pain.  Patient reports fell yesterday when she was trying to go to the trash can and fell against a refrigerator.  EMS was calledt, patient declined further evaluation at that time.  Patient continued with significant pain and immobility today which prompted her presentation.  Patient denies any syncopal episode and can recall event in its entirety.  No other complaints at this time.  Pt admitted with comminuted lt intertochanteric hip fracture.   Reviewed I/O's: +486 ml x2 4 hours  UOP: 0 ml x 24 hours  Per orthopedics notes, plan for cephalo-medullary nailing of lt hip.   Pt down in OR at this time. Unable to obtain further nutrition-related history at this time.   Per meal completion records, PO 100% yesterday. Per MST report, pt reports unitentional weight loss and eating poorly due to a decreased appetite PTA. Reviewed wt records; pt wt has been stable over the past 8 months.   Pt with increased nutrient needs due to post-operative healing and  would benefit from addition of nutritional supplements.   Medications reviewed and include prednisone.  Labs reviewed.   NUTRITION - FOCUSED PHYSICAL EXAM:    Most Recent Value  Orbital Region  Unable to assess  Upper Arm Region  Unable to assess  Thoracic and Lumbar Region  Unable to assess  Buccal Region  Unable to assess  Temple Region  Unable to assess  Clavicle Bone Region  Unable to assess  Clavicle and Acromion Bone Region  Unable to assess  Scapular Bone Region  Unable to assess  Dorsal Hand  Unable to assess  Patellar Region  Unable to assess  Anterior Thigh Region  Unable to assess  Posterior Calf Region  Unable to assess  Edema (RD Assessment)  Unable to assess  Hair  Unable to assess  Eyes  Unable to assess  Mouth  Unable to assess  Skin  Unable to assess  Nails  Unable to assess       Diet Order:   Diet Order            Diet NPO time specified  Diet effective midnight              EDUCATION NEEDS:   No education needs have been identified at this time  Skin:  Skin Assessment: Reviewed RN Assessment  Last BM:  Unknown  Height:   Ht Readings from Last 1 Encounters:  06/07/18 5\' 1"  (1.549 m)    Weight:   Wt Readings from Last 1 Encounters:  06/07/18 48.4 kg    Ideal Body Weight:  47.7  kg  BMI:  Body mass index is 20.16 kg/m.  Estimated Nutritional Needs:   Kcal:  1450-1650  Protein:  65-80 grams  Fluid:  > 1.4 L    Ireene Ballowe A. Jimmye Norman, RD, LDN, [place] Registered Dietitian II Certified Diabetes Care and Education Specialist Pager: [phone] After hours Pager: [phone]

## 2018-06-07 NOTE — Anesthesia Postprocedure Evaluation (Signed)
Anesthesia Post Note  Patient: [name]  Procedure(s) Performed: INTRAMEDULLARY (IM) NAIL INTERTROCHANTRIC (Left Leg Upper)     Patient location during evaluation: PACU Anesthesia Type: General Level of consciousness: awake Pain management: pain level controlled Vital Signs Assessment: post-procedure vital signs reviewed and stable Respiratory status: spontaneous breathing, nonlabored ventilation and respiratory function stable Cardiovascular status: blood pressure returned to baseline and stable Postop Assessment: no apparent nausea or vomiting Anesthetic complications: no    Last Vitals:  Vitals:   06/07/18 1149 06/07/18 1215  BP: 138/78 140/84  Pulse: 75 76  Resp: 16 16  Temp: (!) 36.3 C (!) 36.4 C  SpO2: 98% 97%    Last Pain:  Vitals:   06/07/18 1215  TempSrc: Oral  PainSc:                  Audry Pili

## 2018-06-07 NOTE — Anesthesia Procedure Notes (Signed)
Procedure Name: LMA Insertion Date/Time: 06/07/2018 9:45 AM Performed by: Trinna Post., CRNA Pre-anesthesia Checklist: Patient identified, Emergency Drugs available, Suction available, Patient being monitored and Timeout performed Patient Re-evaluated:Patient Re-evaluated prior to induction Oxygen Delivery Method: Circle system utilized Preoxygenation: Pre-oxygenation with 100% oxygen Induction Type: IV induction LMA: LMA inserted LMA Size: 4.0 Number of attempts: 1 Placement Confirmation: positive ETCO2 and breath sounds checked- equal and bilateral Tube secured with: Tape Dental Injury: Teeth and Oropharynx as per pre-operative assessment

## 2018-06-07 NOTE — Progress Notes (Signed)
PHARMACY NOTE:  ANTIMICROBIAL RENAL DOSAGE ADJUSTMENT  Current antimicrobial regimen includes a mismatch between antimicrobial dosage and estimated renal function.  As per policy approved by the Pharmacy & Therapeutics and Medical Executive Committees, the antimicrobial dosage will be adjusted accordingly.  Current antimicrobial dosage:  Cefazolin 1gm IV q8h x 3 doses  Indication: surgical prophylaxis  Renal Function:  Estimated Creatinine Clearance: 21.1 mL/min (A) (by C-G formula based on SCr of 1.63 mg/dL (H)). []      On intermittent HD, scheduled: []      On CRRT    Antimicrobial dosage has been changed to:  Cefazolin 1gm IV q12h x 2 doses  Additional comments:    Cefazolin 2gm IV given pre-op at 0947am  Thank you for allowing pharmacy to be a part of this patient's care.  Arty Baumgartner, [facility]  Pager: [phone] 06/07/2018 12:35 PM

## 2018-06-07 NOTE — Progress Notes (Signed)
3 rings placed in patient belongings bag in room

## 2018-06-07 NOTE — Anesthesia Preprocedure Evaluation (Addendum)
Anesthesia Evaluation  Patient identified by MRN, date of birth, ID band Patient awake    Reviewed: Allergy & Precautions, NPO status , Patient's Chart, lab work & pertinent test results, reviewed documented beta blocker date and time   History of Anesthesia Complications Negative for: history of anesthetic complications  Airway Mallampati: III  TM Distance: >3 FB Neck ROM: Limited    Dental  (+) Dental Advisory Given, Edentulous Upper, Edentulous Lower   Pulmonary neg pulmonary ROS, former smoker,    breath sounds clear to auscultation       Cardiovascular hypertension, Pt. on medications and Pt. on home beta blockers + CAD, + Past MI, + Cardiac Stents and + DVT  + Valvular Problems/Murmurs AS and MR  Rhythm:Regular Rate:Normal   '20 TTE - EF >65%. Severe basal septal hypertrophy of the septal wall. Left ventricular diastolic Doppler parameters are consistent with impaired relaxation. Elevated left ventricular end-diastolic pressure. There is dynamic mid cavitary LV obstruction at rest up to 48mmHg. Mitral valve regurgitation is moderate by color flow Doppler. Mild stenosis of the aortic valve.  '20 Cath - Prox RCA lesion is 100% stenosed. A drug-eluting stent was successfully placed using a STENT SYNERGY DES 2.5X38, postdilated to > 3.0 mm. Post intervention, there is a 0% residual stenosis. 2nd Diag lesion is 40% stenosed. Ost 2nd Diag lesion is 50% stenosed. The left ventricular systolic function is normal. LV end diastolic pressure is normal. The left ventricular ejection fraction is 55-65% by visual estimate. There is no aortic valve stenosis. A drug-eluting stent was successfully placed using a STENT SYNERGY DES 2.5X38    Neuro/Psych  Headaches, Seizures -, Well Controlled,  PSYCHIATRIC DISORDERS Anxiety Depression    GI/Hepatic negative GI ROS, Neg liver ROS,   Endo/Other  negative endocrine ROS   Renal/GU CRFRenal disease     Musculoskeletal  (+) Arthritis , Rheumatoid disorders,    Abdominal   Peds  Hematology  (+) anemia ,   Anesthesia Other Findings   Reproductive/Obstetrics                            Anesthesia Physical Anesthesia Plan  ASA: III  Anesthesia Plan: General   Post-op Pain Management:    Induction: Intravenous  PONV Risk Score and Plan: 3 and Treatment may vary due to age or medical condition and Ondansetron  Airway Management Planned: LMA  Additional Equipment: None  Intra-op Plan:   Post-operative Plan: Extubation in OR  Informed Consent: I have reviewed the patients History and Physical, chart, labs and discussed the procedure including the risks, benefits and alternatives for the proposed anesthesia with the patient or authorized representative who has indicated his/her understanding and acceptance.   Patient has DNR.  Discussed DNR with patient and Suspend DNR.   Dental advisory given  Plan Discussed with: CRNA and Anesthesiologist  Anesthesia Plan Comments:        Anesthesia Quick Evaluation

## 2018-06-07 NOTE — Op Note (Signed)
[name] female 78 y.o. 06/07/2018  PreOperative Diagnosis: Left intertrochanteric femur fracture  PostOperative Diagnosis: Same  PROCEDURE: Cephalo-medullary nail of left intertrochanteric hip fracture  SURGEON: Melony Overly, MD  ASSISTANT: None  ANESTHESIA: General LMA anesthesia  FINDINGS: Left displaced intertrochanteric hip fracture  IMPLANTS: Biomet affixus cephalo-medullary nail 9 x 340 mm 95 mm cephalo-medullary screw  INDICATIONS:77 y.o. female fell at home 2 days ago and had pain in her left hip.  EMS was called and helped her back into her bed but she did not wish to go to the emergency department at that time.  She has continued pain in her left hip and therefore was brought to the emergency department by her husband and found to have a left displaced intertrochanteric hip fracture.  At baseline patient is ambulatory within the home with a walker and a cane occasionally.  She has a history of myocardial infarction with stent placement.  She also has a history of rheumatoid arthritis and takes Plaquenil and prednisone.  Given her ambulatory status she was indicated for cephalo-medullary nailing of her intertrochanteric hip fracture.  We discussed the risk benefits and alternatives with her and her husband which included but were not limited to wound healing complications, infection, nonunion, malunion, need for further surgery, demonstrating structures.  We also discussed the perioperative and anesthetic risk which include death.  Patient had an active DNR order.  Patient was admitted to the internal medicine team and cardiology was consulted for perioperative DVT prophylaxis given that she takes Brilinta.  PROCEDURE:Patient was identified in the preoperative holding area and the left hip was marked by myself.  The consent was signed by myself and the patient.  This identified the left lower extremity as the appropriate operative extremity and the surgeries to be  performed was operative treatment of left hip fracture. They  were taken to the operating room where general endotracheal anesthesia was induced without difficulty and then was moved supine on a Hana table.  The operative leg was placed into the traction boot.  The non-operative leg was well-padded and affixed to the right leg holder with coban.  The arm was crossed over the chest and well padded. 2 g of Ancef was given.  Surgical timeout was performed.  Using fluoroscopy appropriate AP and lateral x-rays of the left hip were obtained.  Then using traction through the Hana table was able to reduce the fracture fragments in a closed fashion.  Acceptable reduction was confirmed on AP and lateral x-rays.  Then the left hip was prepped and draped in usual sterile fashion.  A shower curtain drape was placed.  We began the surgery by placing the guidepin percutaneously at the appropriate starting point at the tip of the greater trochanter.  The appropriate starting point was confirmed on AP and lateral radiograph.  Then a 3 cm incision was made about the site of the percutaneous placement of the pin and the opening reamer with a soft tissue guide was placed into the wound down to the tip of the greater trochanter and the bone was entered with the opening reamer down to the level of lesser trochanter.  Then the openning reamer and guide pin were removed and a bead tip guidewire was placed into the opening down to the level of the knee and confirmed on fluoroscopy.  Then the length was confirmed using a measuring stick.  Then we chose an 340 mm cephalo-medullary nail.  This was placed without difficulty and appropriate positioning  was confirmed on fluoroscopy.  Then using the blade insertion guide a second incision was made distally down the thigh to allow for placement of the cephalo-medullary blade.  The guidepin for the blade was placed up the femoral neck in the appropriate position center center in the femoral head.   Then the reamer was used to ream for the blade and the blade was placed without difficulty.  The length of the blade was a 95 mm.  Then the set screw was tightened.  Then using perfect circle technique the distal interlock screw was placed without difficulty.  Final films were taken.  The wounds were irrigated with normal saline and the incisions were closed in a layered fashion using 2-0 PDS, 2-0 Vicryl and staples.  Mepilex dressings were used.  He was then transferred to the hospital bed and awakened from anesthesia without difficulty.  Counts were correct at the end the case.  There were no complications.  POST OPERATIVE INSTRUCTIONS: Weightbearing as tolerated left lower extremity Keep dressing in place and reinforce as needed She will mobilize with physical therapy for ultimate disposition planning. DVT prophylaxis per cardiology recommendations postoperatively She will follow-up in 2 weeks with x-rays, wound check and staple removal.  TOURNIQUET TIME:none  BLOOD LOSS:  200 mL         DRAINS: none         SPECIMEN: none       COMPLICATIONS:  * No complications entered in OR log *         Disposition: PACU - hemodynamically stable.         Condition: stable

## 2018-06-07 NOTE — Consult Note (Signed)
Reason for Consult: Left intertrochanteric hip fracture Referring Physician: Emergency department, [place] [facility]  [name] is an 78 y.o. female.  HPI: 2 days ago the patient was going to the trashcan and had a fall onto her left hip.  EMS was initially called and she was helped into her bed but did not request any further treatment.  She continued to have left hip pain and her husband brought her to the emergency department where x-rays revealed a left intertrochanteric hip fracture.  Orthopedics was consulted for evaluation.  On my evaluation patient complains of pain in the left hip.  She denies any other joint or extremity pain today.  Prior to her injury she was able to ambulate within the house using a walker and a cane.  She denies any hip pain prior to the injury.  Currently denies any numbness or tingling in bilateral upper or lower extremities.  Pain in the left hip is sharp and worsened with movement.  Is better with rest and pain medicine.  Of note patient has a history of heart failure and status post stenting for MI.  She also has a history of rheumatoid arthritis and takes Plaquenil and prednisone.  She takes Brilinta at baseline and cardiology has been consulted by the hospitalist team for perioperative anticoagulation therapy.  Past Medical History:  Diagnosis Date  . Acute kidney injury superimposed on chronic kidney disease ([facility]) 02/06/2015  . Anxiety   . CAD (coronary artery disease) 02/28/2018   s/p stent  . Depression   . DVT (deep venous thrombosis) ([facility]) 1970s   LLE  . Heart murmur   . Hypercholesterolemia   . Hypertension   . Kidney stones   . Migraine    "a few/year" (07/16/2015)  . Multiple falls   . Pneumonia "several times"  . Rheumatoid arthritis(714.0)    "all over" (07/16/2015)  . Seizure [facility])    "last one was 1//2017" (07/16/2015)  . Syncope and collapse   . Vision abnormalities     Past Surgical History:  Procedure Laterality Date  . ABDOMINAL  HYSTERECTOMY    . CARPAL TUNNEL RELEASE Bilateral   . CATARACT EXTRACTION W/ INTRAOCULAR LENS  IMPLANT, BILATERAL Bilateral   . CORONARY/GRAFT ACUTE MI REVASCULARIZATION N/A 02/28/2018   Procedure: Coronary/Graft Acute MI Revascularization;  Surgeon: Jettie Booze, MD;  Location: [facility];  Service: Cardiovascular;  Laterality: N/A;  . ESOPHAGOGASTRODUODENOSCOPY N/A 03/05/2018   Procedure: ESOPHAGOGASTRODUODENOSCOPY (EGD);  Surgeon: Ladene Artist, MD;  Location: [facility] ENDOSCOPY;  Service: Endoscopy;  Laterality: N/A;  . LEFT HEART CATH AND CORONARY ANGIOGRAPHY N/A 02/28/2018   Procedure: LEFT HEART CATH AND CORONARY ANGIOGRAPHY;  Surgeon: Jettie Booze, MD;  Location: [facility];  Service: Cardiovascular;  Laterality: N/A;  . NECK EXPLORATION  07/16/2015  . PARATHYROIDECTOMY Right 07/16/2015   superior  . PARATHYROIDECTOMY Right 07/16/2015   Procedure: RIGHT INFERIOR PARATHYROIDECTOMY;  Surgeon: Armandina Gemma, MD;  Location: [facility];  Service: General;  Laterality: Right;  . SUBMUCOSAL INJECTION  03/05/2018   Procedure: SUBMUCOSAL INJECTION;  Surgeon: Ladene Artist, MD;  Location: [facility] ENDOSCOPY;  Service: Endoscopy;;  . TOENAIL AVULSION Bilateral    great toe    Family History  Problem Relation Age of Onset  . Cancer Father   . Prostate cancer Father   . Cancer Brother   . Heart disease Brother   . Miscarriages / [place] Brother   . Heart disease Mother   . Pneumonia Mother  Social History:  reports that she quit smoking about 4 months ago. Her smoking use included cigarettes. She has a 62.00 pack-year smoking history. She has never used smokeless tobacco. She reports that she does not drink alcohol or use drugs.  Allergies: No Known Allergies  Medications: I have reviewed the patient's current medications.  Results for orders placed or performed during the hospital encounter of 06/06/18 (from the past 48 hour(s))  SARS Coronavirus 2 (CEPHEID -  Performed in [facility] hospital lab), Hosp Order     Status: None   Collection Time: 06/06/18  6:39 PM  Result Value Ref Range   SARS Coronavirus 2 NEGATIVE NEGATIVE    Comment: (NOTE) If result is NEGATIVE SARS-CoV-2 target nucleic acids are NOT DETECTED. The SARS-CoV-2 RNA is generally detectable in upper and lower  respiratory specimens during the acute phase of infection. The lowest  concentration of SARS-CoV-2 viral copies this assay can detect is 250  copies / mL. A negative result does not preclude SARS-CoV-2 infection  and should not be used as the sole basis for treatment or other  patient management decisions.  A negative result may occur with  improper specimen collection / handling, submission of specimen other  than nasopharyngeal swab, presence of viral mutation(s) within the  areas targeted by this assay, and inadequate number of viral copies  (<250 copies / mL). A negative result must be combined with clinical  observations, patient history, and epidemiological information. If result is POSITIVE SARS-CoV-2 target nucleic acids are DETECTED. The SARS-CoV-2 RNA is generally detectable in upper and lower  respiratory specimens dur ing the acute phase of infection.  Positive  results are indicative of active infection with SARS-CoV-2.  Clinical  correlation with patient history and other diagnostic information is  necessary to determine patient infection status.  Positive results do  not rule out bacterial infection or co-infection with other viruses. If result is PRESUMPTIVE POSTIVE SARS-CoV-2 nucleic acids MAY BE PRESENT.   A presumptive positive result was obtained on the submitted specimen  and confirmed on repeat testing.  While 2019 novel coronavirus  (SARS-CoV-2) nucleic acids may be present in the submitted sample  additional confirmatory testing may be necessary for epidemiological  and / or clinical management purposes  to differentiate between  SARS-CoV-2  and other Sarbecovirus currently known to infect humans.  If clinically indicated additional testing with an alternate test  methodology [phone]) is advised. The SARS-CoV-2 RNA is generally  detectable in upper and lower respiratory sp ecimens during the acute  phase of infection. The expected result is Negative. Fact Sheet for Patients:  StrictlyIdeas.no Fact Sheet for Healthcare Providers: BankingDealers.co.za This test is not yet approved or cleared by the [place] FDA and has been authorized for detection and/or diagnosis of SARS-CoV-2 by FDA under an Emergency Use Authorization (EUA).  This EUA will remain in effect (meaning this test can be used) for the duration of the COVID-19 declaration under Section 564(b)(1) of the Act, 21 U.S.C. section 360bbb-3(b)(1), unless the authorization is terminated or revoked sooner. Performed at [facility], [place] [address], [place], [place] [address]   Basic metabolic panel     Status: Abnormal   Collection Time: 06/06/18  6:49 PM  Result Value Ref Range   Sodium 142 135 - 145 mmol/L   Potassium 4.8 3.5 - 5.1 mmol/L   Chloride 110 98 - 111 mmol/L   CO2 22 22 - 32 mmol/L   Glucose, Bld 96 70 -  99 mg/dL   BUN 24 (H) 8 - 23 mg/dL   Creatinine, Ser 1.48 (H) 0.44 - 1.00 mg/dL   Calcium 8.9 8.9 - 10.3 mg/dL   GFR calc non Af Amer 33 (L) >60 mL/min   GFR calc Af Amer 39 (L) >60 mL/min   Anion gap 10 5 - 15    Comment: Performed at [facility], [place] [address] [place], [place] [address]  CBC with Differential     Status: Abnormal   Collection Time: 06/06/18  6:49 PM  Result Value Ref Range   WBC 12.3 (H) 4.0 - 10.5 K/uL   RBC 4.49 3.87 - 5.11 MIL/uL   Hemoglobin 11.6 (L) 12.0 - 15.0 g/dL   HCT 37.5 36.0 - 46.0 %   MCV 83.5 80.0 - 100.0 fL   MCH 25.8 (L) 26.0 - 34.0 pg   MCHC 30.9 30.0 - 36.0 g/dL   RDW 18.9 (H) 11.5 - 15.5 %   Platelets 311 150 -  400 K/uL   nRBC 0.0 0.0 - 0.2 %   Neutrophils Relative % 77 %   Neutro Abs 9.7 (H) 1.7 - 7.7 K/uL   Lymphocytes Relative 8 %   Lymphs Abs 0.9 0.7 - 4.0 K/uL   Monocytes Relative 12 %   Monocytes Absolute 1.5 (H) 0.1 - 1.0 K/uL   Eosinophils Relative 1 %   Eosinophils Absolute 0.1 0.0 - 0.5 K/uL   Basophils Relative 1 %   Basophils Absolute 0.1 0.0 - 0.1 K/uL   Immature Granulocytes 1 %   Abs Immature Granulocytes 0.07 0.00 - 0.07 K/uL    Comment: Performed at [facility], [place] [address] [place], [place] [address]  Surgical pcr screen     Status: None   Collection Time: 06/06/18 11:26 PM  Result Value Ref Range   MRSA, PCR NEGATIVE NEGATIVE   Staphylococcus aureus NEGATIVE NEGATIVE    Comment: (NOTE) The Xpert SA Assay (FDA approved for NASAL specimens in patients 80 years of age and older), is one component of a comprehensive surveillance program. It is not intended to diagnose infection nor to guide or monitor treatment. Performed at [facility], [place] [address], [place], [place] [address]   Protime-INR     Status: Abnormal   Collection Time: 06/06/18 11:45 PM  Result Value Ref Range   Prothrombin Time 15.7 (H) 11.4 - 15.2 seconds   INR 1.3 (H) 0.8 - 1.2    Comment: (NOTE) INR goal varies based on device and disease states. Performed at [facility], [place] [address], [place], [place] [address]   Type and screen [place]     Status: None   Collection Time: 06/06/18 11:45 PM  Result Value Ref Range   ABO/RH(D) B NEG    Antibody Screen NEG    Sample Expiration      06/09/2018,2359 Performed at [facility], [place] [address], [place], [place] [address]   CBC     Status: Abnormal   Collection Time: 06/07/18  1:43 AM  Result Value Ref Range   WBC 12.2 (H) 4.0 - 10.5 K/uL   RBC 4.13 3.87 - 5.11 MIL/uL   Hemoglobin 10.7 (L) 12.0 - 15.0 g/dL   HCT 33.2 (L) 36.0 - 46.0 %   MCV 80.4 80.0 - 100.0 fL   MCH 25.9 (L)  26.0 - 34.0 pg   MCHC 32.2 30.0 - 36.0 g/dL   RDW 18.6 (H)  11.5 - 15.5 %   Platelets 279 150 - 400 K/uL   nRBC 0.0 0.0 - 0.2 %    Comment: Performed at [facility], [place] [address], [place], [place] [address]  Basic metabolic panel     Status: Abnormal   Collection Time: 06/07/18  1:43 AM  Result Value Ref Range   Sodium 141 135 - 145 mmol/L   Potassium 4.4 3.5 - 5.1 mmol/L   Chloride 109 98 - 111 mmol/L   CO2 21 (L) 22 - 32 mmol/L   Glucose, Bld 222 (H) 70 - 99 mg/dL   BUN 22 8 - 23 mg/dL   Creatinine, Ser 1.63 (H) 0.44 - 1.00 mg/dL   Calcium 8.6 (L) 8.9 - 10.3 mg/dL   GFR calc non Af Amer 30 (L) >60 mL/min   GFR calc Af Amer 34 (L) >60 mL/min   Anion gap 11 5 - 15    Comment: Performed at [place] [address] [place], [place] [address]    Dg Chest 1 View  Result Date: 06/06/2018 CLINICAL DATA:  Fall with hip fracture EXAM: CHEST  1 VIEW COMPARISON:  04/22/2018 FINDINGS: No acute opacity or pleural effusion. Stable cardiomediastinal silhouette with aortic atherosclerosis. No pneumothorax. IMPRESSION: No active disease. Electronically Signed   By: Donavan Foil M.D.   On: 06/06/2018 18:20   Dg Shoulder Left  Result Date: 06/06/2018 CLINICAL DATA:  With shoulder pain EXAM: LEFT SHOULDER - 2+ VIEW COMPARISON:  None. FINDINGS: AC joint is intact.  No fracture or dislocation. IMPRESSION: No acute osseous abnormality Electronically Signed   By: Donavan Foil M.D.   On: 06/06/2018 18:19   Dg Hip Unilat W Or Wo Pelvis 2-3 Views Left  Result Date: 06/06/2018 CLINICAL DATA:  Fall EXAM: DG HIP (WITH OR WITHOUT PELVIS) 2-3V LEFT COMPARISON:  CT 03/29/2018 FINDINGS: Acute mildly comminuted left intertrochanteric fracture. Left femoral head projects in joint. Mild arthritis of both hips. Pubic symphysis is intact IMPRESSION: Acute mildly comminuted left intertrochanteric fracture Electronically Signed   By: Donavan Foil M.D.   On: 06/06/2018 18:18    Review of Systems   Constitutional: Negative.   HENT: Negative.   Eyes: Negative.   Cardiovascular: Negative.   Gastrointestinal: Negative.   Musculoskeletal:       Left hip pain  Skin: Negative.   Neurological: Negative.   Psychiatric/Behavioral: Negative.    Blood pressure (!) 179/89, pulse 86, temperature 98.4 F (36.9 C), temperature source Oral, resp. rate 14, height 5\' 1"  (1.549 m), weight 48.4 kg, SpO2 100 %. Physical Exam  Constitutional: She appears well-developed.  HENT:  Head: Normocephalic.  Eyes: Conjunctivae are normal.  Neck: Neck supple.  Cardiovascular: Normal rate.  HTN  Respiratory: Effort normal.  GI: Soft.  Musculoskeletal:     Comments: Patient has hips flexed and externally rotated.  She has tenderness palpation of the left hip.  No tenderness about the right hip.  Pain with attempted motion of the left hip.  No tenderness to palpation of bilateral knees.  No tenderness palpation of bilateral ankles or feet.  No skin issues that I can appreciate about the hip.  Patient has no evidence of bilateral upper extremity injury.  She is able to move her shoulders, elbows, wrists and hands without pain or difficulty.  Patient endorses sensation light touch median, ulnar, radial nerve distribution as well as bilaterally feet dorsally and plantarly.  Feet are warm and well-perfused.  Neurological: She is  alert.  Skin: Skin is warm.  Psychiatric: She has a normal mood and affect.    Assessment/Plan: Patient previously was ambulatory about the house with a walker and cane and therefore was indicated for fixation of her left intertrochanteric hip fracture.  I had a long discussion with her and her husband about the risk, benefits and alternatives to surgery and after weighing these risks they opted to proceed with surgery.  Risks we discussed included but were not limited to wound healing complications, infection, need for further surgery, nonunion, malunion, continued pain or damage  surrounding structures.  We also discussed the perioperative and anesthetic risk which include death.  She was given a mild to moderate risk by the hospitalist team to proceed with surgery without further testing.  She currently has a DNR order.  Her preoperative coronavirus test was negative.  She has a baseline anemia with a hemoglobin of 10.7.    Erle Crocker 06/07/2018, 9:09 AM

## 2018-06-07 NOTE — Transfer of Care (Signed)
Immediate Anesthesia Transfer of Care Note  Patient: [name]  Procedure(s) Performed: INTRAMEDULLARY (IM) NAIL INTERTROCHANTRIC (Left Leg Upper)  Patient Location: PACU  Anesthesia Type:General  Level of Consciousness: awake and drowsy  Airway & Oxygen Therapy: Patient Spontanous Breathing and Patient connected to nasal cannula oxygen  Post-op Assessment: Report given to RN and Post -op Vital signs reviewed and stable  Post vital signs: Reviewed and stable  Last Vitals:  Vitals Value Taken Time  BP 154/90 06/07/2018 10:53 AM  Temp    Pulse    Resp 18 06/07/2018 10:54 AM  SpO2    Vitals shown include unvalidated device data.  Last Pain:  Vitals:   06/07/18 0812  TempSrc:   PainSc: 2       Patients Stated Pain Goal: 2 (29/79/89 2119)  Complications: No apparent anesthesia complications

## 2018-06-07 NOTE — Progress Notes (Signed)
PROGRESS NOTE    [name]  XVQ:[id number] DOB: Dec 16, 1940 DOA: 06/06/2018 PCP: Glendale Chard, MD    Brief Narrative:  78 y.o. female with medical history significant of diastolic congestive heart failure, rheumatoid arthritis, CAD status post PCI/stent, HLD, history of GI bleed with gastric AVM who presents to [facility] long ED with complaints of left hip and shoulder pain.  Patient reports fell yesterday when she was trying to go to the trash can and fell against a refrigerator.  EMS was calledt, patient declined further evaluation at that time.  Patient continued with significant pain and immobility today which prompted her presentation.  Patient denies any syncopal episode and can recall event in its entirety.  No other complaints at this time.  Patient denies headache, no fever/chills/night sweats, no nausea/vomiting/diarrhea, no chest pain, palpitations, no abdominal pain, no cough/congestion, no shortness of breath, no paresthesias.  Assessment & Plan:   Principal Problem:   Closed left hip fracture, initial encounter [facility]) Active Problems:   Rheumatoid arthritis ([place])   Hyperlipidemia   Seizure disorder ([place])   Essential hypertension   Gastric AVM   Hip fracture ([facility])   comminuted left intertrochanteric hip fracture Patient presenting after a mechanical fall on 06/05/2018 with decreased range of motion and persistent left hip pain.  X-ray notable for mildly comminuted left intratrochanteric hip fracture.  --Orthopedics consulted, pt now /sp surgery 5/22 --continue with analgesic as needed --Follow up PT eval  Chronic diastolic congestive heart failure TTE with EF greater than 65%.  Compensated.  Chest x-ray without vascular congestion. --Continue to monitor daily weights/strict I's and O's -Seems stable at present  CAD status post PCI/stent Patient underwent PCI/stenting of her RCA on 02/28/2018 by Dr. Irish Lack.  Was originally on dual antiplatelet therapy with  Brilinta and aspirin but subsequently developed an acute GI bleed.  Currently on Brilinta 90 mg p.o. twice daily. -Pt had been continued on Brilinta given PCI/stent placed less than 6 months ago, -Cardiology following to give updated recommendation on antiplatelet regimen --Continue Coreg 25 mg p.o. twice daily  Rheumatoid arthritis Home regimen includes Plaquenil 200 mg p.o. daily and prednisone 5 mg p.o. daily. --Hold Plaquenil --Continue home prednisone 5 mg p.o. daily, no indication for stress dose steroids as chronic prednisone dose less than 10 mg daily. -Seems stable currently  History of GI bleed Following PCI/stenting of her RCA in February 2020, patient was started on dual antiplatelet therapy with aspirin and Brilinta.  However, patient developed an acute GI bleed.  She underwent EGD on 03/05/2018 that was notable for gastric AVM, s/p Clip.  Now patient is only on Brilinta outpatient. --Hemoglobin stable 10.7 --Repeat CBC in the a.m.  HLD: Continue Lipitor 80 mg p.o. daily -Stable at this time  Hx seizure disorder: Continue Keppra 1000 mg p.o. daily -No evidence of recurrent seizure   DVT prophylaxis: Will order SCD's Code Status: DNR Family Communication: Pt in room, family not at bedside Disposition Plan: Uncertain at this time  Consultants:   Orthopedic Surgery  Cardiology  Procedures:  Cephalo-medullary nail of left intertrochanteric hip fracture on 5/22  Antimicrobials: Anti-infectives (From admission, onward)   Start     Dose/Rate Route Frequency Ordered Stop   06/07/18 2000  ceFAZolin (ANCEF) IVPB 1 g/50 mL premix     1 g 100 mL/hr over 30 Minutes Intravenous Every 12 hours 06/07/18 1209 06/08/18 1959   06/07/18 0800  ceFAZolin (ANCEF) IVPB 2g/100 mL premix     2 g 200  mL/hr over 30 Minutes Intravenous To Short Stay 06/07/18 0203 06/07/18 1017       Subjective: Without complaints. Still tired post-op  Objective: Vitals:   06/07/18 1123  06/07/18 1138 06/07/18 1149 06/07/18 1215  BP: 134/75 133/81 138/78 140/84  Pulse: 77 76 75 76  Resp: 15 14 16 16   Temp:   (!) 97.3 F (36.3 C) (!) 97.5 F (36.4 C)  TempSrc:    Oral  SpO2: 95% 94% 98% 97%  Weight:      Height:        Intake/Output Summary (Last 24 hours) at 06/07/2018 1258 Last data filed at 06/07/2018 1032 Gross per 24 hour  Intake 1186 ml  Output 200 ml  Net 986 ml   Filed Weights   06/07/18 0048  Weight: 48.4 kg    Examination:  General exam: Appears calm and comfortable  Respiratory system: Clear to auscultation. Respiratory effort normal. Cardiovascular system: S1 & S2 heard, RRR Gastrointestinal system: Abdomen is nondistended, soft and nontender. No organomegaly or masses felt. Normal bowel sounds heard. Central nervous system: No focal neurological deficits. No tremors Extremities: Symmetric 5 x 5 power. Skin: No rashes, lesions Psychiatry: Conversant, alert Mood & affect appropriate.   Data Reviewed: I have personally reviewed following labs and imaging studies  CBC: Recent Labs  Lab 06/06/18 1849 06/07/18 0143  WBC 12.3* 12.2*  NEUTROABS 9.7*  --   HGB 11.6* 10.7*  HCT 37.5 33.2*  MCV 83.5 80.4  PLT 311 761   Basic Metabolic Panel: Recent Labs  Lab 06/06/18 1849 06/07/18 0143  NA 142 141  K 4.8 4.4  CL 110 109  CO2 22 21*  GLUCOSE 96 222*  BUN 24* 22  CREATININE 1.48* 1.63*  CALCIUM 8.9 8.6*   GFR: Estimated Creatinine Clearance: 21.1 mL/min (A) (by C-G formula based on SCr of 1.63 mg/dL (H)). Liver Function Tests: No results for input(s): AST, ALT, ALKPHOS, BILITOT, PROT, ALBUMIN in the last 168 hours. No results for input(s): LIPASE, AMYLASE in the last 168 hours. No results for input(s): AMMONIA in the last 168 hours. Coagulation Profile: Recent Labs  Lab 06/06/18 2345  INR 1.3*   Cardiac Enzymes: No results for input(s): CKTOTAL, CKMB, CKMBINDEX, TROPONINI in the last 168 hours. BNP (last 3 results) No  results for input(s): PROBNP in the last 8760 hours. HbA1C: No results for input(s): HGBA1C in the last 72 hours. CBG: No results for input(s): GLUCAP in the last 168 hours. Lipid Profile: No results for input(s): CHOL, HDL, LDLCALC, TRIG, CHOLHDL, LDLDIRECT in the last 72 hours. Thyroid Function Tests: No results for input(s): TSH, T4TOTAL, FREET4, T3FREE, THYROIDAB in the last 72 hours. Anemia Panel: No results for input(s): VITAMINB12, FOLATE, FERRITIN, TIBC, IRON, RETICCTPCT in the last 72 hours. Sepsis Labs: No results for input(s): PROCALCITON, LATICACIDVEN in the last 168 hours.  Recent Results (from the past 240 hour(s))  SARS Coronavirus 2 (CEPHEID - Performed in [place] hospital lab), Hosp Order     Status: None   Collection Time: 06/06/18  6:39 PM  Result Value Ref Range Status   SARS Coronavirus 2 NEGATIVE NEGATIVE Final    Comment: (NOTE) If result is NEGATIVE SARS-CoV-2 target nucleic acids are NOT DETECTED. The SARS-CoV-2 RNA is generally detectable in upper and lower  respiratory specimens during the acute phase of infection. The lowest  concentration of SARS-CoV-2 viral copies this assay can detect is 250  copies / mL. A negative result does not preclude SARS-CoV-2  infection  and should not be used as the sole basis for treatment or other  patient management decisions.  A negative result may occur with  improper specimen collection / handling, submission of specimen other  than nasopharyngeal swab, presence of viral mutation(s) within the  areas targeted by this assay, and inadequate number of viral copies  (<250 copies / mL). A negative result must be combined with clinical  observations, patient history, and epidemiological information. If result is POSITIVE SARS-CoV-2 target nucleic acids are DETECTED. The SARS-CoV-2 RNA is generally detectable in upper and lower  respiratory specimens dur ing the acute phase of infection.  Positive  results are  indicative of active infection with SARS-CoV-2.  Clinical  correlation with patient history and other diagnostic information is  necessary to determine patient infection status.  Positive results do  not rule out bacterial infection or co-infection with other viruses. If result is PRESUMPTIVE POSTIVE SARS-CoV-2 nucleic acids MAY BE PRESENT.   A presumptive positive result was obtained on the submitted specimen  and confirmed on repeat testing.  While 2019 novel coronavirus  (SARS-CoV-2) nucleic acids may be present in the submitted sample  additional confirmatory testing may be necessary for epidemiological  and / or clinical management purposes  to differentiate between  SARS-CoV-2 and other Sarbecovirus currently known to infect humans.  If clinically indicated additional testing with an alternate test  methodology [phone]) is advised. The SARS-CoV-2 RNA is generally  detectable in upper and lower respiratory sp ecimens during the acute  phase of infection. The expected result is Negative. Fact Sheet for Patients:  StrictlyIdeas.no Fact Sheet for Healthcare Providers: BankingDealers.co.za This test is not yet approved or cleared by the [place] FDA and has been authorized for detection and/or diagnosis of SARS-CoV-2 by FDA under an Emergency Use Authorization (EUA).  This EUA will remain in effect (meaning this test can be used) for the duration of the COVID-19 declaration under Section 564(b)(1) of the Act, 21 U.S.C. section 360bbb-3(b)(1), unless the authorization is terminated or revoked sooner. Performed at [facility], [place] [address] [place], [place] [address]   Surgical pcr screen     Status: None   Collection Time: 06/06/18 11:26 PM  Result Value Ref Range Status   MRSA, PCR NEGATIVE NEGATIVE Final   Staphylococcus aureus NEGATIVE NEGATIVE Final    Comment: (NOTE) The Xpert SA Assay (FDA approved  for NASAL specimens in patients 57 years of age and older), is one component of a comprehensive surveillance program. It is not intended to diagnose infection nor to guide or monitor treatment. Performed at [facility], [place] [address] [place], [place] [address]      Radiology Studies: Dg Chest 1 View  Result Date: 06/06/2018 CLINICAL DATA:  Fall with hip fracture EXAM: CHEST  1 VIEW COMPARISON:  04/22/2018 FINDINGS: No acute opacity or pleural effusion. Stable cardiomediastinal silhouette with aortic atherosclerosis. No pneumothorax. IMPRESSION: No active disease. Electronically Signed   By: Donavan Foil M.D.   On: 06/06/2018 18:20   Dg Shoulder Left  Result Date: 06/06/2018 CLINICAL DATA:  With shoulder pain EXAM: LEFT SHOULDER - 2+ VIEW COMPARISON:  None. FINDINGS: AC joint is intact.  No fracture or dislocation. IMPRESSION: No acute osseous abnormality Electronically Signed   By: Donavan Foil M.D.   On: 06/06/2018 18:19   Dg Hip Unilat W Or Wo Pelvis 2-3 Views Left  Result Date: 06/06/2018 CLINICAL DATA:  Fall EXAM: DG HIP (WITH OR  WITHOUT PELVIS) 2-3V LEFT COMPARISON:  CT 03/29/2018 FINDINGS: Acute mildly comminuted left intertrochanteric fracture. Left femoral head projects in joint. Mild arthritis of both hips. Pubic symphysis is intact IMPRESSION: Acute mildly comminuted left intertrochanteric fracture Electronically Signed   By: Donavan Foil M.D.   On: 06/06/2018 18:18    Scheduled Meds: . atorvastatin  80 mg Oral q1800  . carvedilol  25 mg Oral BID WC  . feeding supplement (ENSURE ENLIVE)  237 mL Oral Q1500  . furosemide  40 mg Oral Daily  . iron polysaccharides  150 mg Oral Daily  . levETIRAcetam  1,000 mg Oral Daily  . mirtazapine  15 mg Oral QHS  . [START ON 06/08/2018] multivitamin with minerals  1 tablet Oral Daily  . pantoprazole  40 mg Oral Daily  . predniSONE  5 mg Oral Q breakfast  . ticagrelor  90 mg Oral BID   Continuous Infusions: . sodium chloride  75 mL/hr at 06/07/18 0039  .  ceFAZolin (ANCEF) IV    . lactated ringers 50 mL/hr at 06/07/18 2297     LOS: 1 day   Marylu Lund, MD [facility] Pager On Amion  If 7PM-7AM, please contact night-coverage 06/07/2018, 12:58 PM

## 2018-06-08 DIAGNOSIS — M069 Rheumatoid arthritis, unspecified: Secondary | ICD-10-CM

## 2018-06-08 DIAGNOSIS — I2510 Atherosclerotic heart disease of native coronary artery without angina pectoris: Secondary | ICD-10-CM

## 2018-06-08 NOTE — Progress Notes (Signed)
PATIENT ID: [name]  MRN: [id number]  DOB/AGE:  [birth year] / 78 y.o.  1 Day Post-Op Procedure(s) (LRB): INTRAMEDULLARY (IM) NAIL INTERTROCHANTRIC (Left)    PROGRESS NOTE Subjective:   Patient is alert, oriented, no Nausea, no Vomiting, yes passing gas, no Bowel Movement. Taking PO well, with pt up in bed eating. Denies SOB, Chest or Calf Pain. Using Incentive Spirometer, PAS in place. Ambulate WBAT with walker and therapy assist, Patient reports pain as mild and moderate, Mild at rest, moderate with activity    Objective: Vital signs in last 24 hours: Temp:  [97.2 F (36.2 C)-98.3 F (36.8 C)] 97.6 F (36.4 C) (05/23 0437) Pulse Rate:  [72-82] 72 (05/23 0437) Resp:  [13-18] 13 (05/23 0437) BP: (124-154)/(66-90) 131/66 (05/23 0437) SpO2:  [94 %-100 %] 96 % (05/23 0437)    Intake/Output from previous day: I/O last 3 completed shifts: In: 2539.7 [P.O.:966; I.V.:1523.7; IV Piggyback:50] Out: 400 [Urine:200; Blood:200]   Intake/Output this shift: No intake/output data recorded.   LABORATORY DATA: Recent Labs    06/06/18 1849 06/06/18 2345 06/07/18 0143  WBC 12.3*  --  12.2*  HGB 11.6*  --  10.7*  HCT 37.5  --  33.2*  PLT 311  --  279  NA 142  --  141  K 4.8  --  4.4  CL 110  --  109  CO2 22  --  21*  BUN 24*  --  22  CREATININE 1.48*  --  1.63*  GLUCOSE 96  --  222*  INR  --  1.3*  --   CALCIUM 8.9  --  8.6*    Examination: Neurologically intact Neurovascular intact Sensation intact distally Intact pulses distally Dorsiflexion/Plantar flexion intact Incision: dressing C/D/I and no drainage No cellulitis present Compartment soft}  Assessment:   1 Day Post-Op Procedure(s) (LRB): INTRAMEDULLARY (IM) NAIL INTERTROCHANTRIC (Left) ADDITIONAL DIAGNOSIS:  Hypertension and RA, seizure disorder  Plan:  Weight Bearing as Tolerated (WBAT) with walker  DVT Prophylaxis:  Ok to restart brilinta from an ortho standpoint   DISCHARGE PLAN: Pt would like to go  home, though therapy is recommending SNF  DISCHARGE NEEDS: HHPT, Walker and 3-in-1 comode seat     Joanell Rising 06/08/2018, 10:02 AM

## 2018-06-08 NOTE — Progress Notes (Signed)
Progress Note  Patient Name: [name] Date of Encounter: 06/08/2018  Primary Cardiologist: Larae Grooms, MD   Subjective   No CP or dyspnea  Inpatient Medications    Scheduled Meds: . atorvastatin  80 mg Oral q1800  . carvedilol  25 mg Oral BID WC  . feeding supplement (ENSURE ENLIVE)  237 mL Oral Q1500  . furosemide  40 mg Oral Daily  . iron polysaccharides  150 mg Oral Daily  . levETIRAcetam  1,000 mg Oral Daily  . mirtazapine  15 mg Oral QHS  . multivitamin with minerals  1 tablet Oral Daily  . pantoprazole  40 mg Oral Daily  . predniSONE  5 mg Oral Q breakfast  . ticagrelor  90 mg Oral BID   Continuous Infusions: . lactated ringers 50 mL/hr at 06/08/18 0851   PRN Meds: HYDROcodone-acetaminophen, morphine injection, ondansetron (ZOFRAN) IV, senna-docusate   Vital Signs    Vitals:   06/07/18 1729 06/07/18 2030 06/08/18 0017 06/08/18 0437  BP:  (!) 148/73 131/74 131/66  Pulse: 80 79 76 72  Resp:  13 14 13   Temp:  98.3 F (36.8 C) 98.1 F (36.7 C) 97.6 F (36.4 C)  TempSrc:  Oral Oral Oral  SpO2:  98% 98% 96%  Weight:      Height:        Intake/Output Summary (Last 24 hours) at 06/08/2018 1051 Last data filed at 06/08/2018 0900 Gross per 24 hour  Intake 1593.65 ml  Output 500 ml  Net 1093.65 ml   Last 3 Weights 06/07/2018 05/22/2018 04/26/2018  Weight (lbs) 106 lb 11.2 oz (No Data) 106 lb 0.7 oz  Weight (kg) 48.4 kg (No Data) 48.1 kg       Physical Exam   GEN: No acute distress.   Neck: No JVD Cardiac: RRR, 2-6/8 systolic murmur Respiratory: Clear to auscultation bilaterally. GI: Soft, nontender, non-distended  MS: No edema Neuro:  Nonfocal  Psych: Normal affect   Labs    Chemistry Recent Labs  Lab 06/06/18 1849 06/07/18 0143  NA 142 141  K 4.8 4.4  CL 110 109  CO2 22 21*  GLUCOSE 96 222*  BUN 24* 22  CREATININE 1.48* 1.63*  CALCIUM 8.9 8.6*  GFRNONAA 33* 30*  GFRAA 39* 34*  ANIONGAP 10 11     Hematology Recent  Labs  Lab 06/06/18 1849 06/07/18 0143  WBC 12.3* 12.2*  RBC 4.49 4.13  HGB 11.6* 10.7*  HCT 37.5 33.2*  MCV 83.5 80.4  MCH 25.8* 25.9*  MCHC 30.9 32.2  RDW 18.9* 18.6*  PLT 311 279    Radiology    Dg Chest 1 View  Result Date: 06/06/2018 CLINICAL DATA:  Fall with hip fracture EXAM: CHEST  1 VIEW COMPARISON:  04/22/2018 FINDINGS: No acute opacity or pleural effusion. Stable cardiomediastinal silhouette with aortic atherosclerosis. No pneumothorax. IMPRESSION: No active disease. Electronically Signed   By: Donavan Foil M.D.   On: 06/06/2018 18:20   Dg Shoulder Left  Result Date: 06/06/2018 CLINICAL DATA:  With shoulder pain EXAM: LEFT SHOULDER - 2+ VIEW COMPARISON:  None. FINDINGS: AC joint is intact.  No fracture or dislocation. IMPRESSION: No acute osseous abnormality Electronically Signed   By: Donavan Foil M.D.   On: 06/06/2018 18:19   Dg C-arm 1-60 Min  Result Date: 06/07/2018 CLINICAL DATA:  ORIF of the left femur. EXAM: LEFT FEMUR 2 VIEWS; DG C-ARM 61-120 MIN COMPARISON:  Left hip radiographs 04/06/2018 FINDINGS: Six intraoperative views are  submitted. The intratrochanteric fracture is reduced. Intramedullary rod with dynamic hip screw is placed. A single distal interlocking screws in place. Alignment is anatomic. IMPRESSION: 1. Left femur ORIF without radiographic evidence for complication. Electronically Signed   By: San Morelle M.D.   On: 06/07/2018 13:28   Dg Hip Unilat W Or Wo Pelvis 2-3 Views Left  Result Date: 06/06/2018 CLINICAL DATA:  Fall EXAM: DG HIP (WITH OR WITHOUT PELVIS) 2-3V LEFT COMPARISON:  CT 03/29/2018 FINDINGS: Acute mildly comminuted left intertrochanteric fracture. Left femoral head projects in joint. Mild arthritis of both hips. Pubic symphysis is intact IMPRESSION: Acute mildly comminuted left intertrochanteric fracture Electronically Signed   By: Donavan Foil M.D.   On: 06/06/2018 18:18   Dg Femur Min 2 Views Left  Result Date: 06/07/2018  CLINICAL DATA:  ORIF of the left femur. EXAM: LEFT FEMUR 2 VIEWS; DG C-ARM 61-120 MIN COMPARISON:  Left hip radiographs 04/06/2018 FINDINGS: Six intraoperative views are submitted. The intratrochanteric fracture is reduced. Intramedullary rod with dynamic hip screw is placed. A single distal interlocking screws in place. Alignment is anatomic. IMPRESSION: 1. Left femur ORIF without radiographic evidence for complication. Electronically Signed   By: San Morelle M.D.   On: 06/07/2018 13:28    Patient Profile     78 y.o. 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  Assessment & Plan    1 coronary artery disease-continue Brilinta at present dose.  Continue statin and carvedilol.  Hemoglobin is relatively stable today.  2 status post hip fracture-management per orthopedics.  We will see again on Tuesday.  Please call prior to then with questions.  Otherwise continue present medications.  For questions or updates, please contact [place] Please consult [email or web address] for contact info under        Signed, Kirk Ruths, MD  06/08/2018, 10:51 AM

## 2018-06-08 NOTE — Progress Notes (Signed)
PROGRESS NOTE    [name]  GDJ:[id number] DOB: 1941-01-13 DOA: 06/06/2018 PCP: Glendale Chard, MD    Brief Narrative:  78 y.o. female with medical history significant of diastolic congestive heart failure, rheumatoid arthritis, CAD status post PCI/stent, HLD, history of GI bleed with gastric AVM who presents to [facility] long ED with complaints of left hip and shoulder pain.  Patient reports fell yesterday when she was trying to go to the trash can and fell against a refrigerator.  EMS was calledt, patient declined further evaluation at that time.  Patient continued with significant pain and immobility today which prompted her presentation.  Patient denies any syncopal episode and can recall event in its entirety.  No other complaints at this time.  Patient denies headache, no fever/chills/night sweats, no nausea/vomiting/diarrhea, no chest pain, palpitations, no abdominal pain, no cough/congestion, no shortness of breath, no paresthesias.  Assessment & Plan:   Principal Problem:   Closed left hip fracture, initial encounter [facility]) Active Problems:   Rheumatoid arthritis ([place])   Hyperlipidemia   Seizure disorder ([place])   Essential hypertension   Gastric AVM   Hip fracture (HCC)   comminuted left intertrochanteric hip fracture Patient presenting after a mechanical fall on 06/05/2018 with decreased range of motion and persistent left hip pain.  X-ray notable for mildly comminuted left intratrochanteric hip fracture.  --Orthopedics consulted, pt now /sp surgery 5/22 --continue with analgesic as needed --PT following, thus far rec for SNF, SW following  Chronic diastolic congestive heart failure TTE with EF greater than 65%.  Compensated.  Chest x-ray without vascular congestion. --Continue to monitor daily weights/strict I's and O's -Presently stable  CAD status post PCI/stent Patient underwent PCI/stenting of her RCA on 02/28/2018 by Dr. Irish Lack.  Was originally on dual  antiplatelet therapy with Brilinta and aspirin but subsequently developed an acute GI bleed.  Currently on Brilinta 90 mg p.o. twice daily. -Pt had been continued on Brilinta given PCI/stent placed less than 6 months ago, -Cardiology following, recommends to continue current antiplatelet, Brilinta --Continuing on Coreg 25 mg p.o. twice daily, Cardiology recommends to continue -Seen at bedside with Cardiologist  Rheumatoid arthritis Home regimen includes Plaquenil 200 mg p.o. daily and prednisone 5 mg p.o. daily. --Hold Plaquenil --Continue home prednisone 5 mg p.o. daily, no indication for stress dose steroids as chronic prednisone dose less than 10 mg daily. -Remains stable at this time  History of GI bleed Following PCI/stenting of her RCA in February 2020, patient was started on dual antiplatelet therapy with aspirin and Brilinta.  However, patient developed an acute GI bleed.  She underwent EGD on 03/05/2018 that was notable for gastric AVM, s/p Clip.  Now patient is only on Brilinta outpatient. --Hemoglobin had been stable at 10.7. Labs reviewed  HLD: Continue Lipitor 80 mg p.o. daily -Presently stable at this time  Hx seizure disorder: Continue Keppra 1000 mg p.o. daily -No evidence of recurrent seizure -Remains stable this AM  DVT prophylaxis: Will order SCD's Code Status: DNR Family Communication: Pt in room, family not at bedside Disposition Plan: Uncertain at this time  Consultants:   Orthopedic Surgery  Cardiology  Procedures:  Cephalo-medullary nail of left intertrochanteric hip fracture on 5/22  Antimicrobials: Anti-infectives (From admission, onward)   Start     Dose/Rate Route Frequency Ordered Stop   06/07/18 2000  ceFAZolin (ANCEF) IVPB 1 g/50 mL premix     1 g 100 mL/hr over 30 Minutes Intravenous Every 12 hours 06/07/18 1209 06/08/18 6222  06/07/18 0800  ceFAZolin (ANCEF) IVPB 2g/100 mL premix     2 g 200 mL/hr over 30 Minutes Intravenous To Short  Stay 06/07/18 0203 06/07/18 1017      Subjective: No complaints this AM  Objective: Vitals:   06/07/18 2030 06/08/18 0017 06/08/18 0437 06/08/18 1312  BP: (!) 148/73 131/74 131/66 139/78  Pulse: 79 76 72 92  Resp: 13 14 13 18   Temp: 98.3 F (36.8 C) 98.1 F (36.7 C) 97.6 F (36.4 C) 98.6 F (37 C)  TempSrc: Oral Oral Oral Oral  SpO2: 98% 98% 96% 95%  Weight:      Height:        Intake/Output Summary (Last 24 hours) at 06/08/2018 1350 Last data filed at 06/08/2018 0900 Gross per 24 hour  Intake 1593.65 ml  Output 500 ml  Net 1093.65 ml   Filed Weights   06/07/18 0048  Weight: 48.4 kg    Examination: General exam: Awake, laying in bed, in nad Respiratory system: Normal respiratory effort, no wheezing Cardiovascular system: regular rate, s1, s2 Gastrointestinal system: Soft, nondistended, positive BS Central nervous system: CN2-12 grossly intact, strength intact Extremities: Perfused, no clubbing Skin: Normal skin turgor, no notable skin lesions seen Psychiatry: Mood normal // no visual hallucinations   Data Reviewed: I have personally reviewed following labs and imaging studies  CBC: Recent Labs  Lab 06/06/18 1849 06/07/18 0143  WBC 12.3* 12.2*  NEUTROABS 9.7*  --   HGB 11.6* 10.7*  HCT 37.5 33.2*  MCV 83.5 80.4  PLT 311 527   Basic Metabolic Panel: Recent Labs  Lab 06/06/18 1849 06/07/18 0143  NA 142 141  K 4.8 4.4  CL 110 109  CO2 22 21*  GLUCOSE 96 222*  BUN 24* 22  CREATININE 1.48* 1.63*  CALCIUM 8.9 8.6*   GFR: Estimated Creatinine Clearance: 21.1 mL/min (A) (by C-G formula based on SCr of 1.63 mg/dL (H)). Liver Function Tests: No results for input(s): AST, ALT, ALKPHOS, BILITOT, PROT, ALBUMIN in the last 168 hours. No results for input(s): LIPASE, AMYLASE in the last 168 hours. No results for input(s): AMMONIA in the last 168 hours. Coagulation Profile: Recent Labs  Lab 06/06/18 2345  INR 1.3*   Cardiac Enzymes: No results for  input(s): CKTOTAL, CKMB, CKMBINDEX, TROPONINI in the last 168 hours. BNP (last 3 results) No results for input(s): PROBNP in the last 8760 hours. HbA1C: No results for input(s): HGBA1C in the last 72 hours. CBG: No results for input(s): GLUCAP in the last 168 hours. Lipid Profile: No results for input(s): CHOL, HDL, LDLCALC, TRIG, CHOLHDL, LDLDIRECT in the last 72 hours. Thyroid Function Tests: No results for input(s): TSH, T4TOTAL, FREET4, T3FREE, THYROIDAB in the last 72 hours. Anemia Panel: No results for input(s): VITAMINB12, FOLATE, FERRITIN, TIBC, IRON, RETICCTPCT in the last 72 hours. Sepsis Labs: No results for input(s): PROCALCITON, LATICACIDVEN in the last 168 hours.  Recent Results (from the past 240 hour(s))  SARS Coronavirus 2 (CEPHEID - Performed in [place] hospital lab), Hosp Order     Status: None   Collection Time: 06/06/18  6:39 PM  Result Value Ref Range Status   SARS Coronavirus 2 NEGATIVE NEGATIVE Final    Comment: (NOTE) If result is NEGATIVE SARS-CoV-2 target nucleic acids are NOT DETECTED. The SARS-CoV-2 RNA is generally detectable in upper and lower  respiratory specimens during the acute phase of infection. The lowest  concentration of SARS-CoV-2 viral copies this assay can detect is 250  copies /  mL. A negative result does not preclude SARS-CoV-2 infection  and should not be used as the sole basis for treatment or other  patient management decisions.  A negative result may occur with  improper specimen collection / handling, submission of specimen other  than nasopharyngeal swab, presence of viral mutation(s) within the  areas targeted by this assay, and inadequate number of viral copies  (<250 copies / mL). A negative result must be combined with clinical  observations, patient history, and epidemiological information. If result is POSITIVE SARS-CoV-2 target nucleic acids are DETECTED. The SARS-CoV-2 RNA is generally detectable in upper and lower   respiratory specimens dur ing the acute phase of infection.  Positive  results are indicative of active infection with SARS-CoV-2.  Clinical  correlation with patient history and other diagnostic information is  necessary to determine patient infection status.  Positive results do  not rule out bacterial infection or co-infection with other viruses. If result is PRESUMPTIVE POSTIVE SARS-CoV-2 nucleic acids MAY BE PRESENT.   A presumptive positive result was obtained on the submitted specimen  and confirmed on repeat testing.  While 2019 novel coronavirus  (SARS-CoV-2) nucleic acids may be present in the submitted sample  additional confirmatory testing may be necessary for epidemiological  and / or clinical management purposes  to differentiate between  SARS-CoV-2 and other Sarbecovirus currently known to infect humans.  If clinically indicated additional testing with an alternate test  methodology [phone]) is advised. The SARS-CoV-2 RNA is generally  detectable in upper and lower respiratory sp ecimens during the acute  phase of infection. The expected result is Negative. Fact Sheet for Patients:  StrictlyIdeas.no Fact Sheet for Healthcare Providers: BankingDealers.co.za This test is not yet approved or cleared by the [place] FDA and has been authorized for detection and/or diagnosis of SARS-CoV-2 by FDA under an Emergency Use Authorization (EUA).  This EUA will remain in effect (meaning this test can be used) for the duration of the COVID-19 declaration under Section 564(b)(1) of the Act, 21 U.S.C. section 360bbb-3(b)(1), unless the authorization is terminated or revoked sooner. Performed at [facility], [place] [address] [place], [place] [address]   Surgical pcr screen     Status: None   Collection Time: 06/06/18 11:26 PM  Result Value Ref Range Status   MRSA, PCR NEGATIVE NEGATIVE Final   Staphylococcus  aureus NEGATIVE NEGATIVE Final    Comment: (NOTE) The Xpert SA Assay (FDA approved for NASAL specimens in patients 43 years of age and older), is one component of a comprehensive surveillance program. It is not intended to diagnose infection nor to guide or monitor treatment. Performed at [facility], [place] [address], [place], [place] [address]      Radiology Studies: Dg Chest 1 View  Result Date: 06/06/2018 CLINICAL DATA:  Fall with hip fracture EXAM: CHEST  1 VIEW COMPARISON:  04/22/2018 FINDINGS: No acute opacity or pleural effusion. Stable cardiomediastinal silhouette with aortic atherosclerosis. No pneumothorax. IMPRESSION: No active disease. Electronically Signed   By: Donavan Foil M.D.   On: 06/06/2018 18:20   Dg Shoulder Left  Result Date: 06/06/2018 CLINICAL DATA:  With shoulder pain EXAM: LEFT SHOULDER - 2+ VIEW COMPARISON:  None. FINDINGS: AC joint is intact.  No fracture or dislocation. IMPRESSION: No acute osseous abnormality Electronically Signed   By: Donavan Foil M.D.   On: 06/06/2018 18:19   Dg C-arm 1-60 Min  Result Date: 06/07/2018 CLINICAL DATA:  ORIF of the left  femur. EXAM: LEFT FEMUR 2 VIEWS; DG C-ARM 61-120 MIN COMPARISON:  Left hip radiographs 04/06/2018 FINDINGS: Six intraoperative views are submitted. The intratrochanteric fracture is reduced. Intramedullary rod with dynamic hip screw is placed. A single distal interlocking screws in place. Alignment is anatomic. IMPRESSION: 1. Left femur ORIF without radiographic evidence for complication. Electronically Signed   By: San Morelle M.D.   On: 06/07/2018 13:28   Dg Hip Unilat W Or Wo Pelvis 2-3 Views Left  Result Date: 06/06/2018 CLINICAL DATA:  Fall EXAM: DG HIP (WITH OR WITHOUT PELVIS) 2-3V LEFT COMPARISON:  CT 03/29/2018 FINDINGS: Acute mildly comminuted left intertrochanteric fracture. Left femoral head projects in joint. Mild arthritis of both hips. Pubic symphysis is intact IMPRESSION: Acute  mildly comminuted left intertrochanteric fracture Electronically Signed   By: Donavan Foil M.D.   On: 06/06/2018 18:18   Dg Femur Min 2 Views Left  Result Date: 06/07/2018 CLINICAL DATA:  ORIF of the left femur. EXAM: LEFT FEMUR 2 VIEWS; DG C-ARM 61-120 MIN COMPARISON:  Left hip radiographs 04/06/2018 FINDINGS: Six intraoperative views are submitted. The intratrochanteric fracture is reduced. Intramedullary rod with dynamic hip screw is placed. A single distal interlocking screws in place. Alignment is anatomic. IMPRESSION: 1. Left femur ORIF without radiographic evidence for complication. Electronically Signed   By: San Morelle M.D.   On: 06/07/2018 13:28    Scheduled Meds: . atorvastatin  80 mg Oral q1800  . carvedilol  25 mg Oral BID WC  . feeding supplement (ENSURE ENLIVE)  237 mL Oral Q1500  . furosemide  40 mg Oral Daily  . iron polysaccharides  150 mg Oral Daily  . levETIRAcetam  1,000 mg Oral Daily  . mirtazapine  15 mg Oral QHS  . multivitamin with minerals  1 tablet Oral Daily  . pantoprazole  40 mg Oral Daily  . predniSONE  5 mg Oral Q breakfast  . ticagrelor  90 mg Oral BID   Continuous Infusions: . lactated ringers 50 mL/hr at 06/08/18 0851     LOS: 2 days   Marylu Lund, MD [facility] Pager On Amion  If 7PM-7AM, please contact night-coverage 06/08/2018, 1:50 PM

## 2018-06-09 DIAGNOSIS — K31819 Angiodysplasia of stomach and duodenum without bleeding: Secondary | ICD-10-CM

## 2018-06-09 LAB — CBC
HCT: 26.4 % — ABNORMAL LOW (ref 36.0–46.0)
Hemoglobin: 8.7 g/dL — ABNORMAL LOW (ref 12.0–15.0)
MCH: 26.4 pg (ref 26.0–34.0)
MCHC: 33 g/dL (ref 30.0–36.0)
MCV: 80 fL (ref 80.0–100.0)
Platelets: 287 10*3/uL (ref 150–400)
RBC: 3.3 MIL/uL — ABNORMAL LOW (ref 3.87–5.11)
RDW: 18 % — ABNORMAL HIGH (ref 11.5–15.5)
WBC: 13.5 10*3/uL — ABNORMAL HIGH (ref 4.0–10.5)
nRBC: 0 % (ref 0.0–0.2)

## 2018-06-09 LAB — BASIC METABOLIC PANEL
Anion gap: 12 (ref 5–15)
BUN: 41 mg/dL — ABNORMAL HIGH (ref 8–23)
CO2: 24 mmol/L (ref 22–32)
Calcium: 7.9 mg/dL — ABNORMAL LOW (ref 8.9–10.3)
Chloride: 101 mmol/L (ref 98–111)
Creatinine, Ser: 2.14 mg/dL — ABNORMAL HIGH (ref 0.44–1.00)
GFR calc Af Amer: 25 mL/min — ABNORMAL LOW (ref >60)
GFR calc non Af Amer: 21 mL/min — ABNORMAL LOW (ref >60)
Glucose, Bld: 143 mg/dL — ABNORMAL HIGH (ref 70–99)
Potassium: 4.5 mmol/L (ref 3.5–5.1)
Sodium: 137 mmol/L (ref 135–145)

## 2018-06-09 MED ORDER — SODIUM CHLORIDE 0.9 % IV SOLN
INTRAVENOUS | Status: AC
  Administered 2018-06-09: 21:00:00 via INTRAVENOUS

## 2018-06-09 NOTE — TOC Initial Note (Signed)
Transition of Care [facility]) - Initial/Assessment Note    Patient Details  Name: [name] MRN: [id number] Date of Birth: [birth year]  [facility] CM/SW Contact:    Glendale Chard, LCSW Phone Number: 06/09/2018, 12:07 PM  Clinical Narrative:                  CSW was able to contact patients spouse to discuss discharge plan. Patient's husband agrees with PT/OT recommendation for SNF rehab. Patient's husband aware of co-pay requirements and did state he will be unable to afford. Patient's husband did confirm having approval letter for Medicaid LTC. Patient's spouse agrees to referrals to SNF rehab for placement. CSW acknowledged and will begin referral process. Patient's husband acknowledged as well and agrees with plan.   Expected Discharge Plan: Skilled Nursing Facility Barriers to Discharge: SNF Pending Medicaid   Patient Goals and CMS Choice     Choice offered to / list presented to : Spouse  Expected Discharge Plan and Services Expected Discharge Plan: [place] arrangements for the past 2 months: Apartment                                      Prior Living Arrangements/Services Living arrangements for the past 2 months: Apartment Lives with:: Significant Other Patient language and need for interpreter reviewed:: No Do you feel safe going back to the place where you live?: No      Need for Family Participation in Patient Care: Yes (Comment)     Criminal Activity/Legal Involvement Pertinent to Current Situation/Hospitalization: No - Comment as needed  Activities of Daily Living Home Assistive Devices/Equipment: Cane (specify quad or straight), Wheelchair, Environmental consultant (specify type) ADL Screening (condition at time of admission) Patient's cognitive ability adequate to safely complete daily activities?: Yes Is the patient deaf or have difficulty hearing?: No Does the patient have difficulty seeing, even when wearing  glasses/contacts?: No Does the patient have difficulty concentrating, remembering, or making decisions?: No Patient able to express need for assistance with ADLs?: Yes Does the patient have difficulty dressing or bathing?: Yes Independently performs ADLs?: Yes (appropriate for developmental age) Does the patient have difficulty walking or climbing stairs?: Yes Weakness of Legs: Both Weakness of Arms/Hands: Both  Permission Sought/Granted Permission sought to share information with : Facility Sport and exercise psychologist Permission granted to share information with : Yes, Verbal Permission Granted        Permission granted to share info w Relationship: Christabella Alvira [phone]     Emotional Assessment Appearance:: Appears stated age Attitude/Demeanor/Rapport: Unable to Assess Affect (typically observed): Unable to Assess Orientation: : Oriented to Self Alcohol / Substance Use: Not Applicable Psych Involvement: No (comment)  Admission diagnosis:  Closed nondisplaced intertrochanteric fracture of left femur, initial encounter [facility]) [S72.145A] Patient Active Problem List   Diagnosis Date Noted  . Closed left hip fracture, initial encounter ([place]) 06/06/2018  . Hip fracture ([place]) 06/06/2018  . Acute hypoxemic respiratory failure ([place]) 04/21/2018  . Suspected Covid-19 Virus Infection 04/21/2018  . Protein-calorie malnutrition, severe 03/31/2018  . Dehydration 03/29/2018  . Failure to thrive in adult 03/29/2018  . Acute blood loss anemia   . Occult blood in stools   . Gastric AVM   . STEMI (ST elevation myocardial infarction) ([place]) 02/28/2018  . Acute inferior myocardial infarction ([place]) 02/28/2018  . Respiratory arrest ([place])   . Essential  hypertension   . Respiratory failure ([place])   . SIRS (systemic inflammatory response syndrome) ([place]) 08/11/2016  . Nausea vomiting and diarrhea 08/11/2016  . Renal failure (ARF), acute on chronic (HCC) 08/11/2016  . High risk medications (not  anticoagulants) long-term use 04/20/2016  . Bilateral chronic knee pain 04/20/2016  . Pain in left elbow 04/20/2016  . Chronic kidney disease, stage III (moderate) ([place]) 08/03/2015  . Encounter for general adult medical examination without abnormal findings 08/03/2015  . 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 Other long term (current) drug therapy 08/03/2015  . Pain in right shoulder 08/03/2015  . Postmenopausal bleeding 08/03/2015  . Vitamin D deficiency 08/03/2015  . Localized pain of right shoulder joint 08/03/2015  . Primary hyperparathyroidism ([place]) 07/16/2015  . Hyperparathyroidism, primary ([place]) 07/14/2015  . Gait disturbance 03/24/2015  . Neck pain 03/24/2015  . Myalgia and myositis 03/24/2015  . Acute kidney injury superimposed on chronic kidney disease ([place]) 02/06/2015  . Leukocytosis 02/06/2015  . Seizure ([place]) 02/05/2015  . Seizure disorder ([place]) 02/05/2015  . Abnormal MRI of head 02/02/2014  . Cognitive changes 02/02/2014  . Migraine without aura and without status migrainosus, not intractable 02/02/2014  . LOC (loss of consciousness) ([place])   . Syncope 01/12/2014  . Syncope and collapse 01/12/2014  . Hypertensive urgency 01/12/2014  . Rheumatoid arthritis ([place]) 01/12/2014  . Depression 01/12/2014  . Hyperlipidemia 01/12/2014   PCP:  Glendale Chard, MD Pharmacy:   [facility] [place], [place] - [address] [place] [address] Phone: [phone] Fax: [phone]     Social Determinants of Health (SDOH) Interventions    Readmission Risk Interventions Readmission Risk Prevention Plan 04/25/2018 03/30/2018  Transportation Screening Complete Complete  PCP or Specialist Appt within 5-7 Days - Not Complete  Not Complete comments - [place] Screening - Not Complete   Medication Review (RN CM) - Not Complete  Medication Review (RN Care Manager) Complete -  PCP or Specialist appointment within 3-5 days of discharge Complete -  [place] or Home Care Consult Complete -  SW Recovery Care/Counseling Consult Complete -  Palliative Care Screening Not Applicable -  Skilled Nursing Facility Complete -  Some recent data might be hidden

## 2018-06-09 NOTE — Patient Care Conference (Signed)
Tried calling patient's son at number listed at [phone]. Person who answer said that was wrong number. Also tried patient's son via cell listed. No answer. Will try again later.

## 2018-06-09 NOTE — Progress Notes (Signed)
PATIENT ID: [name]  MRN: [id number]  DOB/AGE:  May 15, 1940 / 78 y.o.  2 Days Post-Op Procedure(s) (LRB): INTRAMEDULLARY (IM) NAIL INTERTROCHANTRIC (Left)    PROGRESS NOTE Subjective:   Patient is alert, oriented, no Nausea, no Vomiting, yes passing gas, no Bowel Movement. Taking PO well with pt up and eating in bed. Denies SOB, Chest or Calf Pain. Using Incentive Spirometer, PAS in place. Ambulate WBAT with therapy assist, Patient reports pain as 5 on 0-10 scale    Objective: Vital signs in last 24 hours: Temp:  [98.3 F (36.8 C)-98.7 F (37.1 C)] 98.3 F (36.8 C) (05/24 0529) Pulse Rate:  [85-93] 85 (05/24 0529) Resp:  [18] 18 (05/23 1312) BP: (133-161)/(69-78) 133/69 (05/24 0529) SpO2:  [95 %-97 %] 96 % (05/24 0529) Weight:  [46.9 kg] 46.9 kg (05/24 0500)    Intake/Output from previous day: I/O last 3 completed shifts: In: 1365.8 [P.O.:720; I.V.:595.8; IV Piggyback:50] Out: 1900 [Urine:1900]   Intake/Output this shift: No intake/output data recorded.   LABORATORY DATA: Recent Labs    06/06/18 2345 06/07/18 0143 06/09/18 0330  WBC  --  12.2* 13.5*  HGB  --  10.7* 8.7*  HCT  --  33.2* 26.4*  PLT  --  279 287  NA  --  141 137  K  --  4.4 4.5  CL  --  109 101  CO2  --  21* 24  BUN  --  22 41*  CREATININE  --  1.63* 2.14*  GLUCOSE  --  222* 143*  INR 1.3*  --   --   CALCIUM  --  8.6* 7.9*    Examination: Neurologically intact Neurovascular intact Sensation intact distally Intact pulses distally Dorsiflexion/Plantar flexion intact Incision: dressing C/D/I and no drainage No cellulitis present Compartment soft}  Assessment:   2 Days Post-Op Procedure(s) (LRB): INTRAMEDULLARY (IM) NAIL INTERTROCHANTRIC (Left) ADDITIONAL DIAGNOSIS:  Acute Blood Loss Anemia and CHF, CAD, RA, Seizure disorder  Plan:  Weight Bearing as Tolerated (WBAT) with therapy assist and walker  DVT Prophylaxis:  Brilinta  DISCHARGE PLAN: Skilled Nursing Facility/Rehab when pt  medically stable and bed available  DISCHARGE NEEDS: HHPT, Walker and 3-in-1 comode seat     Joanell Rising 06/09/2018, 9:09 AM

## 2018-06-09 NOTE — Plan of Care (Signed)
  Problem: Education: Goal: Knowledge of General Education information will improve Description: Including pain rating scale, medication(s)/side effects and non-pharmacologic comfort measures Outcome: Progressing   Problem: Clinical Measurements: Goal: Will remain free from infection Outcome: Progressing   

## 2018-06-09 NOTE — Progress Notes (Signed)
Contacted spouse to complete assessment. Left voicemail for callback. Awaiting callback from spouse to assist with discharge planning.

## 2018-06-09 NOTE — Progress Notes (Signed)
Spoke with son Eliot Ford via telepone and he would like for the doctor to call him @ [phone] with updates about mother.

## 2018-06-09 NOTE — Plan of Care (Signed)

## 2018-06-09 NOTE — Progress Notes (Signed)
PROGRESS NOTE    [name]  PIR:[id number] DOB: 09-03-1940 DOA: 06/06/2018 PCP: Glendale Chard, MD    Brief Narrative:  78 y.o. female with medical history significant of diastolic congestive heart failure, rheumatoid arthritis, CAD status post PCI/stent, HLD, history of GI bleed with gastric AVM who presents to [facility] long ED with complaints of left hip and shoulder pain.  Patient reports fell yesterday when she was trying to go to the trash can and fell against a refrigerator.  EMS was calledt, patient declined further evaluation at that time.  Patient continued with significant pain and immobility today which prompted her presentation.  Patient denies any syncopal episode and can recall event in its entirety.  No other complaints at this time.  Patient denies headache, no fever/chills/night sweats, no nausea/vomiting/diarrhea, no chest pain, palpitations, no abdominal pain, no cough/congestion, no shortness of breath, no paresthesias.  Assessment & Plan:   Principal Problem:   Closed left hip fracture, initial encounter [facility]) Active Problems:   Rheumatoid arthritis ([place])   Hyperlipidemia   Seizure disorder ([place])   Essential hypertension   Gastric AVM   Hip fracture ([facility])   comminuted left intertrochanteric hip fracture Patient presenting after a mechanical fall on 06/05/2018 with decreased range of motion and persistent left hip pain.  X-ray notable for mildly comminuted left intratrochanteric hip fracture.  --Orthopedics consulted, pt now /sp surgery 5/22 --continue with analgesic as needed --PT following, thus far rec for SNF, Discussed with SW, following  Chronic diastolic congestive heart failure TTE with EF greater than 65%.  Compensated.  Chest x-ray without vascular congestion. --Continue to monitor daily weights/strict I's and O's -Presently stable  CAD status post PCI/stent Patient underwent PCI/stenting of her RCA on 02/28/2018 by Dr. Irish Lack.  Was  originally on dual antiplatelet therapy with Brilinta and aspirin but subsequently developed an acute GI bleed.  Currently on Brilinta 90 mg p.o. twice daily. -Pt had been continued on Brilinta given PCI/stent placed less than 6 months ago, -Cardiology following, recommends to continue current antiplatelet, Brilinta --Continuing on Coreg 25 mg p.o. twice daily, Cardiology recommends to continue current regimen -stable at this time, pt denies chest pains  Rheumatoid arthritis Home regimen includes Plaquenil 200 mg p.o. daily and prednisone 5 mg p.o. daily. --Hold Plaquenil --Continue home prednisone 5 mg p.o. daily, no indication for stress dose steroids as chronic prednisone dose less than 10 mg daily. -Currently stable at this time  History of GI bleed, acute blood loss anemia Following PCI/stenting of her RCA in February 2020, patient was started on dual antiplatelet therapy with aspirin and Brilinta.  However, patient developed an acute GI bleed.  She underwent EGD on 03/05/2018 that was notable for gastric AVM, s/p Clip.  Now patient is only on Brilinta outpatient. --Hemoglobin down to 8.7 this AM. Suspect possible post-op anemia. Of note, prior hgb baseline had been around 8.2-8.5 one month ago -Will repeat CBC, transfuse if hgb <7.0  HLD: Continue Lipitor 80 mg p.o. daily -Remains stable at this time  Hx seizure disorder: Continue Keppra 1000 mg p.o. daily -No evidence of recurrent seizure -Currently stable  DVT prophylaxis: Will order SCD's Code Status: DNR Family Communication: Pt in room, family not at bedside Disposition Plan: SNF when bed available and pending medicaid  Consultants:   Orthopedic Surgery  Cardiology  Procedures:  Cephalo-medullary nail of left intertrochanteric hip fracture on 5/22  Antimicrobials: Anti-infectives (From admission, onward)   Start     Dose/Rate Route Frequency  Ordered Stop   06/07/18 2000  ceFAZolin (ANCEF) IVPB 1 g/50 mL premix      1 g 100 mL/hr over 30 Minutes Intravenous Every 12 hours 06/07/18 1209 06/08/18 0922   06/07/18 0800  ceFAZolin (ANCEF) IVPB 2g/100 mL premix     2 g 200 mL/hr over 30 Minutes Intravenous To Short Stay 06/07/18 0203 06/07/18 1017      Subjective: Without complaints at this time  Objective: Vitals:   06/08/18 1312 06/08/18 2101 06/09/18 0500 06/09/18 0529  BP: 139/78 (!) 161/76  133/69  Pulse: 92 93  85  Resp: 18     Temp: 98.6 F (37 C) 98.7 F (37.1 C)  98.3 F (36.8 C)  TempSrc: Oral Oral  Oral  SpO2: 95% 97%  96%  Weight:   46.9 kg   Height:        Intake/Output Summary (Last 24 hours) at 06/09/2018 1414 Last data filed at 06/09/2018 1300 Gross per 24 hour  Intake 480 ml  Output 2100 ml  Net -1620 ml   Filed Weights   06/07/18 0048 06/09/18 0500  Weight: 48.4 kg 46.9 kg    Examination: General exam: Conversant, in no acute distress Respiratory system: normal chest rise, clear, no audible wheezing Cardiovascular system: regular rhythm, s1-s2 Gastrointestinal system: Nondistended, nontender, pos BS Central nervous system: No seizures, no tremors Extremities: No cyanosis, no joint deformities Skin: No rashes, no pallor Psychiatry: Affect normal // no auditory hallucinations   Data Reviewed: I have personally reviewed following labs and imaging studies  CBC: Recent Labs  Lab 06/06/18 1849 06/07/18 0143 06/09/18 0330  WBC 12.3* 12.2* 13.5*  NEUTROABS 9.7*  --   --   HGB 11.6* 10.7* 8.7*  HCT 37.5 33.2* 26.4*  MCV 83.5 80.4 80.0  PLT 311 279 937   Basic Metabolic Panel: Recent Labs  Lab 06/06/18 1849 06/07/18 0143 06/09/18 0330  NA 142 141 137  K 4.8 4.4 4.5  CL 110 109 101  CO2 22 21* 24  GLUCOSE 96 222* 143*  BUN 24* 22 41*  CREATININE 1.48* 1.63* 2.14*  CALCIUM 8.9 8.6* 7.9*   GFR: Estimated Creatinine Clearance: 15.8 mL/min (A) (by C-G formula based on SCr of 2.14 mg/dL (H)). Liver Function Tests: No results for input(s): AST, ALT,  ALKPHOS, BILITOT, PROT, ALBUMIN in the last 168 hours. No results for input(s): LIPASE, AMYLASE in the last 168 hours. No results for input(s): AMMONIA in the last 168 hours. Coagulation Profile: Recent Labs  Lab 06/06/18 2345  INR 1.3*   Cardiac Enzymes: No results for input(s): CKTOTAL, CKMB, CKMBINDEX, TROPONINI in the last 168 hours. BNP (last 3 results) No results for input(s): PROBNP in the last 8760 hours. HbA1C: No results for input(s): HGBA1C in the last 72 hours. CBG: No results for input(s): GLUCAP in the last 168 hours. Lipid Profile: No results for input(s): CHOL, HDL, LDLCALC, TRIG, CHOLHDL, LDLDIRECT in the last 72 hours. Thyroid Function Tests: No results for input(s): TSH, T4TOTAL, FREET4, T3FREE, THYROIDAB in the last 72 hours. Anemia Panel: No results for input(s): VITAMINB12, FOLATE, FERRITIN, TIBC, IRON, RETICCTPCT in the last 72 hours. Sepsis Labs: No results for input(s): PROCALCITON, LATICACIDVEN in the last 168 hours.  Recent Results (from the past 240 hour(s))  SARS Coronavirus 2 (CEPHEID - Performed in [place] hospital lab), Hosp Order     Status: None   Collection Time: 06/06/18  6:39 PM  Result Value Ref Range Status   SARS Coronavirus 2  NEGATIVE NEGATIVE Final    Comment: (NOTE) If result is NEGATIVE SARS-CoV-2 target nucleic acids are NOT DETECTED. The SARS-CoV-2 RNA is generally detectable in upper and lower  respiratory specimens during the acute phase of infection. The lowest  concentration of SARS-CoV-2 viral copies this assay can detect is 250  copies / mL. A negative result does not preclude SARS-CoV-2 infection  and should not be used as the sole basis for treatment or other  patient management decisions.  A negative result may occur with  improper specimen collection / handling, submission of specimen other  than nasopharyngeal swab, presence of viral mutation(s) within the  areas targeted by this assay, and inadequate number of  viral copies  (<250 copies / mL). A negative result must be combined with clinical  observations, patient history, and epidemiological information. If result is POSITIVE SARS-CoV-2 target nucleic acids are DETECTED. The SARS-CoV-2 RNA is generally detectable in upper and lower  respiratory specimens dur ing the acute phase of infection.  Positive  results are indicative of active infection with SARS-CoV-2.  Clinical  correlation with patient history and other diagnostic information is  necessary to determine patient infection status.  Positive results do  not rule out bacterial infection or co-infection with other viruses. If result is PRESUMPTIVE POSTIVE SARS-CoV-2 nucleic acids MAY BE PRESENT.   A presumptive positive result was obtained on the submitted specimen  and confirmed on repeat testing.  While 2019 novel coronavirus  (SARS-CoV-2) nucleic acids may be present in the submitted sample  additional confirmatory testing may be necessary for epidemiological  and / or clinical management purposes  to differentiate between  SARS-CoV-2 and other Sarbecovirus currently known to infect humans.  If clinically indicated additional testing with an alternate test  methodology [phone]) is advised. The SARS-CoV-2 RNA is generally  detectable in upper and lower respiratory sp ecimens during the acute  phase of infection. The expected result is Negative. Fact Sheet for Patients:  StrictlyIdeas.no Fact Sheet for Healthcare Providers: BankingDealers.co.za This test is not yet approved or cleared by the [place] FDA and has been authorized for detection and/or diagnosis of SARS-CoV-2 by FDA under an Emergency Use Authorization (EUA).  This EUA will remain in effect (meaning this test can be used) for the duration of the COVID-19 declaration under Section 564(b)(1) of the Act, 21 U.S.C. section 360bbb-3(b)(1), unless the authorization is  terminated or revoked sooner. Performed at [facility], [place] [address], [place], [place] [address]   Surgical pcr screen     Status: None   Collection Time: 06/06/18 11:26 PM  Result Value Ref Range Status   MRSA, PCR NEGATIVE NEGATIVE Final   Staphylococcus aureus NEGATIVE NEGATIVE Final    Comment: (NOTE) The Xpert SA Assay (FDA approved for NASAL specimens in patients 69 years of age and older), is one component of a comprehensive surveillance program. It is not intended to diagnose infection nor to guide or monitor treatment. Performed at [facility], [place] [address] [place], [place] [address]      Radiology Studies: No results found.  Scheduled Meds: . atorvastatin  80 mg Oral q1800  . carvedilol  25 mg Oral BID WC  . feeding supplement (ENSURE ENLIVE)  237 mL Oral Q1500  . furosemide  40 mg Oral Daily  . iron polysaccharides  150 mg Oral Daily  . levETIRAcetam  1,000 mg Oral Daily  . mirtazapine  15 mg Oral QHS  . multivitamin with minerals  1 tablet Oral Daily  . pantoprazole  40 mg Oral Daily  . predniSONE  5 mg Oral Q breakfast  . ticagrelor  90 mg Oral BID   Continuous Infusions:    LOS: 3 days   Marylu Lund, MD [facility] Pager On Amion  If 7PM-7AM, please contact night-coverage 06/09/2018, 2:14 PM

## 2018-06-10 LAB — COMPREHENSIVE METABOLIC PANEL
ALT: 6 U/L (ref 0–44)
AST: 17 U/L (ref 15–41)
Albumin: 2.7 g/dL — ABNORMAL LOW (ref 3.5–5.0)
Alkaline Phosphatase: 91 U/L (ref 38–126)
Anion gap: 11 (ref 5–15)
BUN: 47 mg/dL — ABNORMAL HIGH (ref 8–23)
CO2: 26 mmol/L (ref 22–32)
Calcium: 8.1 mg/dL — ABNORMAL LOW (ref 8.9–10.3)
Chloride: 102 mmol/L (ref 98–111)
Creatinine, Ser: 2.31 mg/dL — ABNORMAL HIGH (ref 0.44–1.00)
GFR calc Af Amer: 23 mL/min — ABNORMAL LOW (ref >60)
GFR calc non Af Amer: 19 mL/min — ABNORMAL LOW (ref >60)
Glucose, Bld: 110 mg/dL — ABNORMAL HIGH (ref 70–99)
Potassium: 4.4 mmol/L (ref 3.5–5.1)
Sodium: 139 mmol/L (ref 135–145)
Total Bilirubin: 0.7 mg/dL (ref 0.3–1.2)
Total Protein: 6.7 g/dL (ref 6.5–8.1)

## 2018-06-10 LAB — CBC
HCT: 27.6 % — ABNORMAL LOW (ref 36.0–46.0)
Hemoglobin: 8.9 g/dL — ABNORMAL LOW (ref 12.0–15.0)
MCH: 25.9 pg — ABNORMAL LOW (ref 26.0–34.0)
MCHC: 32.2 g/dL (ref 30.0–36.0)
MCV: 80.5 fL (ref 80.0–100.0)
Platelets: 310 10*3/uL (ref 150–400)
RBC: 3.43 MIL/uL — ABNORMAL LOW (ref 3.87–5.11)
RDW: 18.2 % — ABNORMAL HIGH (ref 11.5–15.5)
WBC: 13.2 10*3/uL — ABNORMAL HIGH (ref 4.0–10.5)
nRBC: 0.2 % (ref 0.0–0.2)

## 2018-06-10 MED ORDER — SODIUM CHLORIDE 0.9 % IV SOLN: INTRAVENOUS | Status: DC

## 2018-06-10 MED ORDER — TAMSULOSIN HCL 0.4 MG PO CAPS
0.4000 mg | ORAL_CAPSULE | Freq: Every day | ORAL | Status: DC
  Administered 2018-06-10 – 2018-06-11 (×2): 0.4 mg via ORAL
  Filled 2018-06-10 (×2): qty 1

## 2018-06-10 MED ORDER — SODIUM CHLORIDE 0.9 % IV SOLN: INTRAVENOUS | Status: AC

## 2018-06-10 NOTE — Care Management Important Message (Signed)
Important Message  Patient Details  Name: [name] MRN: [id number] Date of Birth: 1941/01/16   Medicare Important Message Given:  Yes    Orbie Pyo 06/10/2018, 2:43 PM

## 2018-06-10 NOTE — Progress Notes (Signed)
Occupational Therapy Evaluation Patient Details Name: [name] MRN: [id number] DOB: February 06, 1940 Today's Date: 06/10/2018    History of Present Illness Pt is a 78 y.o. F with significant PMH of diastolic congestive heart failure, rheumatoid arthritis, CAD s/p PCI/stent, history of GI bleed who presents with complaints of left hip and shoulder pain following a fall. Found to have comminuted left intertrochanteric hip fx, no s/p cephalo medullary nail.   Clinical Impression   PTA, pt lived at home with husband and ambulated with RW. Pt states she completed her own basic ADL tasks but had assistance with IADL activities. Mobilized pt to the [facility] with use of Stedy and +2 Max A. Pt sat on BSC for increased amount of time and had very large BM. Pt became less responsive and required Total A +2 to transfer back to bed. Pt more responsive once in bed. BP sitting on BSC 154/123. Supine 128/71; SpO2 99 RA. Nsg made aware. Pt will need rehab at SNF to return to PLOF. Will follow acutely.     Follow Up Recommendations  SNF;Supervision/Assistance - 24 hour    Equipment Recommendations  None recommended by OT    Recommendations for Other Services       Precautions / Restrictions Precautions Precautions: Fall Restrictions Weight Bearing Restrictions: No Other Position/Activity Restrictions: WBAT      Mobility Bed Mobility Overal bed mobility: Needs Assistance Bed Mobility: Supine to Sit     Supine to sit: Mod assist        Transfers Overall transfer level: Needs assistance   Transfers: Sit to/from Stand Sit to Stand: Max assist;+2 physical assistance              Balance Overall balance assessment: Needs assistance   Sitting balance-Leahy Scale: Fair       Standing balance-Leahy Scale: Poor                             ADL either performed or assessed with clinical judgement   ADL Overall ADL's : Needs assistance/impaired     Grooming: Set  up;Sitting;Supervision/safety   Upper Body Bathing: Minimal assistance;Sitting   Lower Body Bathing: Maximal assistance;Sit to/from stand   Upper Body Dressing : Moderate assistance;Sitting   Lower Body Dressing: Maximal assistance;Sit to/from stand   Toilet Transfer: +2 for physical assistance;Maximal assistance;BSC(Stedy)   Toileting- Clothing Manipulation and Hygiene: Total assistance;Sit to/from stand       Functional mobility during ADLs: Maximal assistance;+2 for physical assistance       Vision Baseline Vision/History: Wears glasses Wears Glasses: Reading only       Perception     Praxis      Pertinent Vitals/Pain Pain Assessment: 0-10 Pain Score: 8  Pain Location: left hip Pain Descriptors / Indicators: Grimacing;Operative site guarding;Moaning Pain Intervention(s): Limited activity within patient's tolerance     Hand Dominance Right   Extremity/Trunk Assessment Upper Extremity Assessment Upper Extremity Assessment: Generalized weakness(L UE weaker than R)   Lower Extremity Assessment Lower Extremity Assessment: Defer to PT evaluation   Cervical / Trunk Assessment Cervical / Trunk Assessment: Kyphotic   Communication Communication Communication: No difficulties   Cognition Arousal/Alertness: Awake/alert Behavior During Therapy: Flat affect Overall Cognitive Status: No family/caregiver present to determine baseline cognitive functioning Area of Impairment: Orientation;Memory;Awareness;Problem solving                 Orientation Level: Disoriented to;Time;Place   Memory: Decreased short-term memory Following  Commands: Follows one step commands with increased time   Awareness: Emergent Problem Solving: Slow processing;Decreased initiation;Difficulty sequencing     General Comments       Exercises     Shoulder Instructions      Home Living Family/patient expects to be discharged to:: Skilled nursing facility                                         Prior Functioning/Environment Level of Independence: Needs assistance  Gait / Transfers Assistance Needed: ambulates with RW ADL's / Homemaking Assistance Needed: Pt staes she was comleting her own bathing and dressing            OT Problem List: Decreased strength;Decreased activity tolerance;Decreased range of motion;Impaired balance (sitting and/or standing);Decreased cognition;Decreased safety awareness;Decreased knowledge of use of DME or AE;Pain      OT Treatment/Interventions: Self-care/ADL training;Therapeutic exercise;DME and/or AE instruction;Therapeutic activities;Cognitive remediation/compensation;Patient/family education;Balance training    OT Goals(Current goals can be found in the care plan section) Acute Rehab OT Goals Patient Stated Goal: to get stronger OT Goal Formulation: With patient Time For Goal Achievement: 06/24/18 Potential to Achieve Goals: Good  OT Frequency: Min 2X/week   Barriers to D/C:            Co-evaluation PT/OT/SLP Co-Evaluation/Treatment: Yes Reason for Co-Treatment: To address functional/ADL transfers;For patient/therapist safety   OT goals addressed during session: ADL's and self-care      AM-PAC OT "6 Clicks" Daily Activity     Outcome Measure Help from another person eating meals?: None Help from another person taking care of personal grooming?: A Little Help from another person toileting, which includes using toliet, bedpan, or urinal?: A Lot Help from another person bathing (including washing, rinsing, drying)?: A Lot Help from another person to put on and taking off regular upper body clothing?: A Lot Help from another person to put on and taking off regular lower body clothing?: A Lot 6 Click Score: 15   End of Session Equipment Utilized During Treatment: Gait belt Nurse Communication: Mobility status  Activity Tolerance: Patient tolerated treatment well Patient left: in chair;with call  bell/phone within reach;with chair alarm set  OT Visit Diagnosis: Unsteadiness on feet (R26.81);Other abnormalities of gait and mobility (R26.89);History of falling (Z91.81);Muscle weakness (generalized) (M62.81);Other symptoms and signs involving cognitive function;Pain Pain - Right/Left: Left Pain - part of body: Hip                Time: 7510-2585 OT Time Calculation (min): 57 min Charges:  OT General Charges $OT Visit: 1 Visit OT Evaluation $OT Eval Moderate Complexity: 1 Mod OT Treatments $Self Care/Home Management : 8-22 mins  Maurie Boettcher, OT/L   Acute OT Clinical Specialist [place] Pager [phone] Office [phone]   [facility] 06/10/2018, 2:32 PM

## 2018-06-10 NOTE — TOC Progression Note (Addendum)
Transition of Care [facility]) - Progression Note    Patient Details  Name: [name] MRN: [id number] Date of Birth: 11/08/1940  Transition of Care [facility]) CM/SW [place], [place] Phone Number: 06/10/2018, 9:42 AM  Clinical Narrative:    CSW f/u with pt spouse via telephone, introduced self, role, reason for call. Pt spouse remembers speaking with weekend CSW. CSW reviewed pt needs and confirmed that pt spouse is interested in SNF. However pt spouse currently does NOT have an approval letter for Medicaid. He states that he received approval and then a follow up denial which has confused him. Pt spouse still works at [employer] in [place] and cannot provide 24/7 assistance with pt. [name] was active with pt but there is concern with her being home alone. At this time pt spouse cannot afford copays for SNF.  Pt spouse expresses difficulty getting in touch with DSS worker Sherita/Cherita Lovena Le regarding pt status. CSW has reached out to Rayetta Humphrey with [facility] regarding Medicaid status to see if she appears active in their system. At current time pt has no Medicaid offers, will continue to f/u with referrals.    Expected Discharge Plan: [place] Barriers to Discharge: SNF Pending Medicaid  Expected Discharge Plan and Services Expected Discharge Plan: [place] In-house Referral: Clinical Social Work Discharge Planning Services: CM Consult Post Acute Care Choice: [place] arrangements for the past 2 months: [place] Determinants of Health (SDOH) Interventions    Readmission Risk Interventions Readmission Risk Prevention Plan 06/10/2018 04/25/2018 03/30/2018  Transportation Screening Complete Complete Complete  PCP or Specialist Appt within 5-7 Days - - Not Complete  Not Complete comments - - [place] Screening - - Not Complete  Medication  Review (RN CM) - - Not Complete  Medication Review ([place]) Complete Complete -  PCP or Specialist appointment within 3-5 days of discharge Complete Complete -  [place] or Home Care Consult Complete Complete -  SW Recovery Care/Counseling Consult Complete Complete -  Palliative Care Screening Not Applicable Not Applicable -  Skilled Nursing Facility Complete Complete -  Some recent data might be hidden

## 2018-06-10 NOTE — Progress Notes (Signed)
PROGRESS NOTE    [name]  ATF:[id number] DOB: 19-Jul-1940 DOA: 06/06/2018 PCP: Glendale Chard, MD    Brief Narrative:  78 y.o. female with medical history significant of diastolic congestive heart failure, rheumatoid arthritis, CAD status post PCI/stent, HLD, history of GI bleed with gastric AVM who presents to [facility] long ED with complaints of left hip and shoulder pain.  Patient reports fell yesterday when she was trying to go to the trash can and fell against a refrigerator.  EMS was calledt, patient declined further evaluation at that time.  Patient continued with significant pain and immobility today which prompted her presentation.  Patient denies any syncopal episode and can recall event in its entirety.  No other complaints at this time.  Patient denies headache, no fever/chills/night sweats, no nausea/vomiting/diarrhea, no chest pain, palpitations, no abdominal pain, no cough/congestion, no shortness of breath, no paresthesias.  Assessment & Plan:   Principal Problem:   Closed left hip fracture, initial encounter [facility]) Active Problems:   Rheumatoid arthritis ([place])   Hyperlipidemia   Seizure disorder ([place])   Essential hypertension   Gastric AVM   Hip fracture (HCC)   comminuted left intertrochanteric hip fracture Patient presenting after a mechanical fall on 06/05/2018 with decreased range of motion and persistent left hip pain.  X-ray notable for mildly comminuted left intratrochanteric hip fracture.  --Orthopedics consulted, pt now /sp surgery 5/22 --continue with analgesic as needed --PT following, thus far rec for SNF, SW following  Chronic diastolic congestive heart failure TTE with EF greater than 65%.  Compensated.  Chest x-ray without vascular congestion. --Stable at present  CAD status post PCI/stent Patient underwent PCI/stenting of her RCA on 02/28/2018 by Dr. Irish Lack.  Was originally on dual antiplatelet therapy with Brilinta and aspirin but  subsequently developed an acute GI bleed.  Currently on Brilinta 90 mg p.o. twice daily. -Pt had been continued on Brilinta given PCI/stent placed less than 6 months ago, -Cardiology following, recommends to continue current antiplatelet, Brilinta --Continuing on Coreg 25 mg p.o. twice daily, Cardiology recommends to continue current regimen -remains stable without chest pain  Rheumatoid arthritis Home regimen includes Plaquenil 200 mg p.o. daily and prednisone 5 mg p.o. daily. --Hold Plaquenil --Continue home prednisone 5 mg p.o. daily, no indication for stress dose steroids as chronic prednisone dose less than 10 mg daily. -presently stable  History of GI bleed, acute blood loss anemia Following PCI/stenting of her RCA in February 2020, patient was started on dual antiplatelet therapy with aspirin and Brilinta.  However, patient developed an acute GI bleed.  She underwent EGD on 03/05/2018 that was notable for gastric AVM, s/p Clip.  Now patient is only on Brilinta outpatient. --Suspect possible post-op anemia. Of note, prior hgb baseline had been around 8.2-8.5 one month ago -Hgb stable at 8.9  HLD: Continue Lipitor 80 mg p.o. daily -Remains stable at this time  Hx seizure disorder: Continue Keppra 1000 mg p.o. daily -No evidence of recurrent seizure -Currently stable  Acute renal failure secondary to bladder out let obstruction -Cr up to 2.3 despite IVF overnight -Have bladder scanned patient, found to have over 700cc on scan improved with I/o cath -Repeat bladder scan in 6-8hrs per protocol. If patient continues to retain urine (>300cc on scan), then would place indwelling cath -repeat bmet in AM  DVT prophylaxis: Will order SCD's Code Status: DNR Family Communication: Pt in room, family not at bedside Disposition Plan: SNF when bed available and pending medicaid  Consultants:  Orthopedic Surgery  Cardiology  Procedures:  Cephalo-medullary nail of left  intertrochanteric hip fracture on 5/22  Antimicrobials: Anti-infectives (From admission, onward)   Start     Dose/Rate Route Frequency Ordered Stop   06/07/18 2000  ceFAZolin (ANCEF) IVPB 1 g/50 mL premix     1 g 100 mL/hr over 30 Minutes Intravenous Every 12 hours 06/07/18 1209 06/08/18 0922   06/07/18 0800  ceFAZolin (ANCEF) IVPB 2g/100 mL premix     2 g 200 mL/hr over 30 Minutes Intravenous To Short Stay 06/07/18 0203 06/07/18 1017      Subjective: No complaints  Objective: Vitals:   06/09/18 1300 06/09/18 1947 06/10/18 0425 06/10/18 0500  BP: 135/72 119/70 137/67   Pulse: 84 78 70   Resp:      Temp: 98 F (36.7 C) 98.9 F (37.2 C) 98.3 F (36.8 C)   TempSrc: Oral Oral Oral   SpO2: 95% 98% 96%   Weight:    46.1 kg  Height:        Intake/Output Summary (Last 24 hours) at 06/10/2018 1443 Last data filed at 06/10/2018 1019 Gross per 24 hour  Intake 724.29 ml  Output 1100 ml  Net -375.71 ml   Filed Weights   06/07/18 0048 06/09/18 0500 06/10/18 0500  Weight: 48.4 kg 46.9 kg 46.1 kg    Examination: General exam: Awake, laying in bed, in nad Respiratory system: Normal respiratory effort, no wheezing Cardiovascular system: regular rate, s1, s2 Gastrointestinal system: Soft, nondistended, positive BS Central nervous system: CN2-12 grossly intact, strength intact Extremities: Perfused, no clubbing Skin: Normal skin turgor, no notable skin lesions seen Psychiatry: Mood normal // no visual hallucinations   Data Reviewed: I have personally reviewed following labs and imaging studies  CBC: Recent Labs  Lab 06/06/18 1849 06/07/18 0143 06/09/18 0330 06/10/18 0327  WBC 12.3* 12.2* 13.5* 13.2*  NEUTROABS 9.7*  --   --   --   HGB 11.6* 10.7* 8.7* 8.9*  HCT 37.5 33.2* 26.4* 27.6*  MCV 83.5 80.4 80.0 80.5  PLT 311 279 287 476   Basic Metabolic Panel: Recent Labs  Lab 06/06/18 1849 06/07/18 0143 06/09/18 0330 06/10/18 0327  NA 142 141 137 139  K 4.8 4.4 4.5  4.4  CL 110 109 101 102  CO2 22 21* 24 26  GLUCOSE 96 222* 143* 110*  BUN 24* 22 41* 47*  CREATININE 1.48* 1.63* 2.14* 2.31*  CALCIUM 8.9 8.6* 7.9* 8.1*   GFR: Estimated Creatinine Clearance: 14.4 mL/min (A) (by C-G formula based on SCr of 2.31 mg/dL (H)). Liver Function Tests: Recent Labs  Lab 06/10/18 0327  AST 17  ALT 6  ALKPHOS 91  BILITOT 0.7  PROT 6.7  ALBUMIN 2.7*   No results for input(s): LIPASE, AMYLASE in the last 168 hours. No results for input(s): AMMONIA in the last 168 hours. Coagulation Profile: Recent Labs  Lab 06/06/18 2345  INR 1.3*   Cardiac Enzymes: No results for input(s): CKTOTAL, CKMB, CKMBINDEX, TROPONINI in the last 168 hours. BNP (last 3 results) No results for input(s): PROBNP in the last 8760 hours. HbA1C: No results for input(s): HGBA1C in the last 72 hours. CBG: No results for input(s): GLUCAP in the last 168 hours. Lipid Profile: No results for input(s): CHOL, HDL, LDLCALC, TRIG, CHOLHDL, LDLDIRECT in the last 72 hours. Thyroid Function Tests: No results for input(s): TSH, T4TOTAL, FREET4, T3FREE, THYROIDAB in the last 72 hours. Anemia Panel: No results for input(s): VITAMINB12, FOLATE, FERRITIN, TIBC, IRON,  RETICCTPCT in the last 72 hours. Sepsis Labs: No results for input(s): PROCALCITON, LATICACIDVEN in the last 168 hours.  Recent Results (from the past 240 hour(s))  SARS Coronavirus 2 (CEPHEID - Performed in [place] hospital lab), Hosp Order     Status: None   Collection Time: 06/06/18  6:39 PM  Result Value Ref Range Status   SARS Coronavirus 2 NEGATIVE NEGATIVE Final    Comment: (NOTE) If result is NEGATIVE SARS-CoV-2 target nucleic acids are NOT DETECTED. The SARS-CoV-2 RNA is generally detectable in upper and lower  respiratory specimens during the acute phase of infection. The lowest  concentration of SARS-CoV-2 viral copies this assay can detect is 250  copies / mL. A negative result does not preclude SARS-CoV-2  infection  and should not be used as the sole basis for treatment or other  patient management decisions.  A negative result may occur with  improper specimen collection / handling, submission of specimen other  than nasopharyngeal swab, presence of viral mutation(s) within the  areas targeted by this assay, and inadequate number of viral copies  (<250 copies / mL). A negative result must be combined with clinical  observations, patient history, and epidemiological information. If result is POSITIVE SARS-CoV-2 target nucleic acids are DETECTED. The SARS-CoV-2 RNA is generally detectable in upper and lower  respiratory specimens dur ing the acute phase of infection.  Positive  results are indicative of active infection with SARS-CoV-2.  Clinical  correlation with patient history and other diagnostic information is  necessary to determine patient infection status.  Positive results do  not rule out bacterial infection or co-infection with other viruses. If result is PRESUMPTIVE POSTIVE SARS-CoV-2 nucleic acids MAY BE PRESENT.   A presumptive positive result was obtained on the submitted specimen  and confirmed on repeat testing.  While 2019 novel coronavirus  (SARS-CoV-2) nucleic acids may be present in the submitted sample  additional confirmatory testing may be necessary for epidemiological  and / or clinical management purposes  to differentiate between  SARS-CoV-2 and other Sarbecovirus currently known to infect humans.  If clinically indicated additional testing with an alternate test  methodology [id number]) is advised. The SARS-CoV-2 RNA is generally  detectable in upper and lower respiratory sp ecimens during the acute  phase of infection. The expected result is Negative. Fact Sheet for Patients:  StrictlyIdeas.no Fact Sheet for Healthcare Providers: BankingDealers.co.za This test is not yet approved or cleared by the [place]  FDA and has been authorized for detection and/or diagnosis of SARS-CoV-2 by FDA under an Emergency Use Authorization (EUA).  This EUA will remain in effect (meaning this test can be used) for the duration of the COVID-19 declaration under Section 564(b)(1) of the Act, 21 U.S.C. section 360bbb-3(b)(1), unless the authorization is terminated or revoked sooner. Performed at [facility], [place] [address] [place], [place] [address]   Surgical pcr screen     Status: None   Collection Time: 06/06/18 11:26 PM  Result Value Ref Range Status   MRSA, PCR NEGATIVE NEGATIVE Final   Staphylococcus aureus NEGATIVE NEGATIVE Final    Comment: (NOTE) The Xpert SA Assay (FDA approved for NASAL specimens in patients 107 years of age and older), is one component of a comprehensive surveillance program. It is not intended to diagnose infection nor to guide or monitor treatment. Performed at [facility], [place] [address] [place], [place] [address]      Radiology Studies: No results found.  Scheduled  Meds: . atorvastatin  80 mg Oral q1800  . carvedilol  25 mg Oral BID WC  . feeding supplement (ENSURE ENLIVE)  237 mL Oral Q1500  . iron polysaccharides  150 mg Oral Daily  . levETIRAcetam  1,000 mg Oral Daily  . mirtazapine  15 mg Oral QHS  . multivitamin with minerals  1 tablet Oral Daily  . pantoprazole  40 mg Oral Daily  . predniSONE  5 mg Oral Q breakfast  . ticagrelor  90 mg Oral BID   Continuous Infusions: . sodium chloride 75 mL/hr at 06/10/18 1149     LOS: 4 days   Marylu Lund, MD [facility] Pager On Amion  If 7PM-7AM, please contact night-coverage 06/10/2018, 2:43 PM

## 2018-06-10 NOTE — Patient Care Conference (Signed)
Called and updated patient's son at previous number listed. All questions answered. Of note, patient's son states best way to contact him would be to call the "office number" [phone] then ask for [facility]

## 2018-06-10 NOTE — Progress Notes (Signed)
Physical Therapy Treatment Patient Details Name: [name] MRN: [id number] DOB: 01-03-1941 Today's Date: 06/10/2018    History of Present Illness Pt is a 78 y.o. F with significant PMH of diastolic congestive heart failure, rheumatoid arthritis, CAD s/p PCI/stent, history of GI bleed who presents with complaints of left hip and shoulder pain following a fall. Found to have comminuted left intertrochanteric hip fx, no s/p cephalo medullary nail.    PT Comments    Patient seen for mobility progression. Sit to stand at bedside x2 with Max/Total A +2 with reduced ability to weight shift to L LE. Use of St[place] to transfer to [facility] - due to increased time and effort by patient for BM, patient becoming less responsive. Once cleaned up and supine BP 128/71, SpO2 99% on RA, patient able to sit up and eat lunch. Will continue to recommend SNF at discharge. PT to continue to follow.    Follow Up Recommendations  SNF;Supervision/Assistance - 24 hour     Equipment Recommendations  Other (comment)(defer)    Recommendations for Other Services OT consult     Precautions / Restrictions Precautions Precautions: Fall Restrictions Weight Bearing Restrictions: No Other Position/Activity Restrictions: WBAT    Mobility  Bed Mobility Overal bed mobility: Needs Assistance Bed Mobility: Supine to Sit     Supine to sit: Mod assist        Transfers Overall transfer level: Needs assistance   Transfers: Sit to/from Stand Sit to Stand: Max assist;+2 physical assistance            Ambulation/Gait             General Gait Details: unable   Stairs             Wheelchair Mobility    Modified Rankin (Stroke Patients Only)       Balance Overall balance assessment: Needs assistance   Sitting balance-Leahy Scale: Fair       Standing balance-Leahy Scale: Poor                              Cognition Arousal/Alertness: Awake/alert Behavior During Therapy:  Flat affect Overall Cognitive Status: No family/caregiver present to determine baseline cognitive functioning Area of Impairment: Orientation;Memory;Awareness;Problem solving                 Orientation Level: Disoriented to;Time;Place   Memory: Decreased short-term memory Following Commands: Follows one step commands with increased time   Awareness: Emergent Problem Solving: Slow processing;Decreased initiation;Difficulty sequencing        Exercises      General Comments        Pertinent Vitals/Pain Pain Assessment: 0-10 Pain Score: 8  Pain Location: left hip Pain Descriptors / Indicators: Grimacing;Operative site guarding;Moaning Pain Intervention(s): Limited activity within patient's tolerance;Monitored during session;Repositioned    Home Living Family/patient expects to be discharged to:: Skilled nursing facility                    Prior Function Level of Independence: Needs assistance  Gait / Transfers Assistance Needed: ambulates with RW ADL's / Homemaking Assistance Needed: Pt staes she was comleting her own bathing and dressing     PT Goals (current goals can now be found in the care plan section) Acute Rehab PT Goals Patient Stated Goal: to get stronger PT Goal Formulation: With patient Time For Goal Achievement: 06/21/18 Potential to Achieve Goals: Fair Progress towards PT goals: Progressing toward  goals    Frequency    Min 3X/week      PT Plan Current plan remains appropriate    Co-evaluation PT/OT/SLP Co-Evaluation/Treatment: Yes Reason for Co-Treatment: For patient/therapist safety;To address functional/ADL transfers PT goals addressed during session: Mobility/safety with mobility;Balance;Proper use of DME;Strengthening/ROM OT goals addressed during session: ADL's and self-care      AM-PAC PT "6 Clicks" Mobility   Outcome Measure  Help needed turning from your back to your side while in a flat bed without using bedrails?:  Total Help needed moving from lying on your back to sitting on the side of a flat bed without using bedrails?: Total Help needed moving to and from a bed to a chair (including a wheelchair)?: Total Help needed standing up from a chair using your arms (e.g., wheelchair or bedside chair)?: Total Help needed to walk in hospital room?: Total Help needed climbing 3-5 steps with a railing? : Total 6 Click Score: 6    End of Session Equipment Utilized During Treatment: Gait belt Activity Tolerance: Patient tolerated treatment well;Patient limited by lethargy Patient left: in bed;with call bell/phone within reach;with bed alarm set Nurse Communication: Mobility status PT Visit Diagnosis: Other abnormalities of gait and mobility (R26.89);Muscle weakness (generalized) (M62.81);History of falling (Z91.81);Pain Pain - Right/Left: Left Pain - part of body: Hip     Time: 5885-0277 PT Time Calculation (min) (ACUTE ONLY): 58 min  Charges:  $Therapeutic Activity: 23-37 mins                     Lanney Gins, PT, DPT Supplemental Physical Therapist 06/10/18 3:51 PM Pager: [phone] Office: [phone]

## 2018-06-10 NOTE — NC FL2 (Signed)
[place] LEVEL OF CARE SCREENING TOOL     IDENTIFICATION  Patient Name: [name] Birthdate: [birth year] Sex: female Admission Date (Current Location): 06/06/2018  [facility] and [place] Number:  Herbalist and Address:  [facility], [place] [address] [place], [place] [address]      Provider Number: [id number]  Attending Physician Name and Address:  Donne Hazel, MD  Relative Name and Phone Number:  [name]; husband; [phone]    Current Level of Care: Hospital Recommended Level of Care: [place] Prior Approval Number:    Date Approved/Denied:   PASRR Number: [id number]  Discharge Plan: SNF    Current Diagnoses: Patient Active Problem List   Diagnosis Date Noted  . Closed left hip fracture, initial encounter ([place]) 06/06/2018  . Hip fracture ([place]) 06/06/2018  . Acute hypoxemic respiratory failure ([place]) 04/21/2018  . Suspected Covid-19 Virus Infection 04/21/2018  . Protein-calorie malnutrition, severe 03/31/2018  . Dehydration 03/29/2018  . Failure to thrive in adult 03/29/2018  . Acute blood loss anemia   . Occult blood in stools   . Gastric AVM   . STEMI (ST elevation myocardial infarction) ([place]) 02/28/2018  . Acute inferior myocardial infarction ([place]) 02/28/2018  . Respiratory arrest ([place])   . Essential hypertension   . Respiratory failure ([place])   . SIRS (systemic inflammatory response syndrome) ([place]) 08/11/2016  . Nausea vomiting and diarrhea 08/11/2016  . Renal failure (ARF), acute on chronic (HCC) 08/11/2016  . High risk medications (not anticoagulants) long-term use 04/20/2016  . Bilateral chronic knee pain 04/20/2016  . Pain in left elbow 04/20/2016  . Chronic kidney disease, stage III (moderate) ([place]) 08/03/2015  . Encounter for general adult medical examination without abnormal findings 08/03/2015  . Hypertensive chronic kidney disease with stage 1 through stage 4 chronic kidney  disease, or unspecified chronic kidney disease 08/03/2015  . Other long term (current) drug therapy 08/03/2015  . Pain in right shoulder 08/03/2015  . Postmenopausal bleeding 08/03/2015  . Vitamin D deficiency 08/03/2015  . Localized pain of right shoulder joint 08/03/2015  . Primary hyperparathyroidism ([place]) 07/16/2015  . Hyperparathyroidism, primary ([place]) 07/14/2015  . Gait disturbance 03/24/2015  . Neck pain 03/24/2015  . Myalgia and myositis 03/24/2015  . Acute kidney injury superimposed on chronic kidney disease ([place]) 02/06/2015  . Leukocytosis 02/06/2015  . Seizure ([place]) 02/05/2015  . Seizure disorder ([place]) 02/05/2015  . Abnormal MRI of head 02/02/2014  . Cognitive changes 02/02/2014  . Migraine without aura and without status migrainosus, not intractable 02/02/2014  . LOC (loss of consciousness) ([place])   . Syncope 01/12/2014  . Syncope and collapse 01/12/2014  . Hypertensive urgency 01/12/2014  . Rheumatoid arthritis ([place]) 01/12/2014  . Depression 01/12/2014  . Hyperlipidemia 01/12/2014    Orientation RESPIRATION BLADDER Height & Weight     Self, Place  Normal Incontinent, External catheter Weight: 101 lb 10.1 oz (46.1 kg) Height:  5\' 1"  (154.9 cm)  BEHAVIORAL SYMPTOMS/MOOD NEUROLOGICAL BOWEL NUTRITION STATUS      Continent Diet(see discharge summary)  AMBULATORY STATUS COMMUNICATION OF NEEDS Skin   Extensive Assist Verbally Surgical wounds(incision on left hip)                       Personal Care Assistance Level of Assistance  Bathing, Feeding, Dressing Bathing Assistance: Maximum assistance Feeding assistance: Limited assistance Dressing Assistance: Maximum assistance     Functional Limitations Info  Sight, Hearing, Speech  Sight Info: Adequate Hearing Info: Adequate Speech Info: Adequate    SPECIAL CARE FACTORS FREQUENCY  PT (By licensed PT), OT (By licensed OT)     PT Frequency: 5x week OT Frequency: 5x week            Contractures  Contractures Info: Not present    Additional Factors Info  Allergies, Code Status, Psychotropic Code Status Info: DNR Allergies Info: No Known Allergies  Psychotropic Info: levETIRAcetam (KEPPRA) tablet 1,000 mg daily PO; mirtazapine (REMERON) tablet 15 mg daily at bedtime PO         Current Medications (06/10/2018):  This is the current hospital active medication list Current Facility-Administered Medications  Medication Dose Route Frequency Provider Last Rate Last Dose  . atorvastatin (LIPITOR) tablet 80 mg  80 mg Oral q1800 Erle Crocker, MD   80 mg at 06/09/18 1828  . carvedilol (COREG) tablet 25 mg  25 mg Oral BID WC Erle Crocker, MD   25 mg at 06/09/18 1829  . feeding supplement (ENSURE ENLIVE) (ENSURE ENLIVE) liquid 237 mL  237 mL Oral Q1500 Erle Crocker, MD   237 mL at 06/09/18 1516  . HYDROcodone-acetaminophen (NORCO/VICODIN) 5-325 MG per tablet 1-2 tablet  1-2 tablet Oral Q6H PRN Erle Crocker, MD   1 tablet at 06/07/18 2318  . iron polysaccharides (NIFEREX) capsule 150 mg  150 mg Oral Daily Erle Crocker, MD   150 mg at 06/09/18 2482  . levETIRAcetam (KEPPRA) tablet 1,000 mg  1,000 mg Oral Daily Erle Crocker, MD   1,000 mg at 06/09/18 5003  . mirtazapine (REMERON) tablet 15 mg  15 mg Oral QHS Erle Crocker, MD   15 mg at 06/09/18 2114  . morphine 2 MG/ML injection 0.5 mg  0.5 mg Intravenous Q2H PRN Erle Crocker, MD   0.5 mg at 06/07/18 1447  . multivitamin with minerals tablet 1 tablet  1 tablet Oral Daily Erle Crocker, MD   1 tablet at 06/09/18 [phone]  . ondansetron (ZOFRAN) injection 4 mg  4 mg Intravenous Q6H PRN Erle Crocker, MD      . pantoprazole (PROTONIX) EC tablet 40 mg  40 mg Oral Daily Erle Crocker, MD   40 mg at 06/09/18 8891  . predniSONE (DELTASONE) tablet 5 mg  5 mg Oral Q breakfast Erle Crocker, MD   5 mg at 06/09/18 6945  . senna-docusate (Senokot-S) tablet 1 tablet  1 tablet  Oral QHS PRN Erle Crocker, MD      . ticagrelor [facility]) tablet 90 mg  90 mg Oral BID Erle Crocker, MD   90 mg at 06/09/18 2114     Discharge Medications: Please see discharge summary for a list of discharge medications.  Relevant Imaging Results:  Relevant Lab Results:   Additional Information SSN: [place], [place]

## 2018-06-10 NOTE — Progress Notes (Signed)
    Patient resting comfortably.  No cardiac issues per the nurse.  No bleeding issues.    BP 137/67 (BP Location: Left Arm)   Pulse 70   Temp 98.3 F (36.8 C) (Oral)   Resp 18   Ht 5\' 1"  (1.549 m)   Wt 46.1 kg   SpO2 96%   BMI 19.20 kg/m   TOlerating Brilinta.  CONtinue for now.  If bleeding becomes an issue, consider change to Plavix. COntinue to hold aspirin.   Jettie Booze, MD

## 2018-06-10 NOTE — Progress Notes (Signed)
PATIENT ID: [name]  MRN: [id number]  DOB/AGE:  Jul 22, 1940 / 78 y.o.  3 Days Post-Op Procedure(s) (LRB): INTRAMEDULLARY (IM) NAIL INTERTROCHANTRIC (Left)    PROGRESS NOTE Subjective:   Patient is alert, oriented, no Nausea, no Vomiting, yes passing gas, no Bowel Movement. Taking PO. Denies SOB, Chest or Calf Pain. Using Incentive Spirometer, PAS in place. Ambulate WBAT with therapy assist and walker, Patient reports pain as 8 on 0-10 scale,     Objective: Vital signs in last 24 hours: Temp:  [98 F (36.7 C)-98.9 F (37.2 C)] 98.3 F (36.8 C) (05/25 0425) Pulse Rate:  [70-84] 70 (05/25 0425) BP: (119-137)/(67-72) 137/67 (05/25 0425) SpO2:  [95 %-98 %] 96 % (05/25 0425)    Intake/Output from previous day: I/O last 3 completed shifts: In: 1024.3 [P.O.:600; I.V.:424.3] Out: 2000 [Urine:2000]   Intake/Output this shift: No intake/output data recorded.   LABORATORY DATA: Recent Labs    06/09/18 0330 06/10/18 0327  WBC 13.5* 13.2*  HGB 8.7* 8.9*  HCT 26.4* 27.6*  PLT 287 310  NA 137 139  K 4.5 4.4  CL 101 102  CO2 24 26  BUN 41* 47*  CREATININE 2.14* 2.31*  GLUCOSE 143* 110*  CALCIUM 7.9* 8.1*    Examination: Neurologically intact Neurovascular intact Sensation intact distally Intact pulses distally Dorsiflexion/Plantar flexion intact Incision: dressing C/D/I and no drainage No cellulitis present Compartment soft}  Assessment:   3 Days Post-Op Procedure(s) (LRB): INTRAMEDULLARY (IM) NAIL INTERTROCHANTRIC (Left) ADDITIONAL DIAGNOSIS:  Acute Blood Loss Anemia and CHF, CAD, RA, Seizure disorder  Plan:   Weight Bearing as Tolerated (WBAT) with therapy assist and walker  DVT Prophylaxis:  Brilinta  DISCHARGE PLAN: Skilled Nursing Facility/Rehab when pt medically stable and bed available  DISCHARGE NEEDS: HHPT, Walker and 3-in-1 comode seat    Joanell Rising 06/10/2018, 7:26 AM

## 2018-06-11 ENCOUNTER — Telehealth: Payer: Self-pay

## 2018-06-11 ENCOUNTER — Encounter (HOSPITAL_COMMUNITY): Payer: Self-pay | Admitting: Orthopaedic Surgery

## 2018-06-11 DIAGNOSIS — G40909 Epilepsy, unspecified, not intractable, without status epilepticus: Secondary | ICD-10-CM

## 2018-06-11 DIAGNOSIS — I25118 Atherosclerotic heart disease of native coronary artery with other forms of angina pectoris: Secondary | ICD-10-CM

## 2018-06-11 LAB — BASIC METABOLIC PANEL
Anion gap: 10 (ref 5–15)
BUN: 44 mg/dL — ABNORMAL HIGH (ref 8–23)
CO2: 24 mmol/L (ref 22–32)
Calcium: 8.2 mg/dL — ABNORMAL LOW (ref 8.9–10.3)
Chloride: 107 mmol/L (ref 98–111)
Creatinine, Ser: 1.73 mg/dL — ABNORMAL HIGH (ref 0.44–1.00)
GFR calc Af Amer: 32 mL/min — ABNORMAL LOW (ref >60)
GFR calc non Af Amer: 28 mL/min — ABNORMAL LOW (ref >60)
Glucose, Bld: 107 mg/dL — ABNORMAL HIGH (ref 70–99)
Potassium: 4.4 mmol/L (ref 3.5–5.1)
Sodium: 141 mmol/L (ref 135–145)

## 2018-06-11 LAB — CBC
HCT: 25.6 % — ABNORMAL LOW (ref 36.0–46.0)
Hemoglobin: 8.2 g/dL — ABNORMAL LOW (ref 12.0–15.0)
MCH: 25.9 pg — ABNORMAL LOW (ref 26.0–34.0)
MCHC: 32 g/dL (ref 30.0–36.0)
MCV: 80.8 fL (ref 80.0–100.0)
Platelets: 308 10*3/uL (ref 150–400)
RBC: 3.17 MIL/uL — ABNORMAL LOW (ref 3.87–5.11)
RDW: 18 % — ABNORMAL HIGH (ref 11.5–15.5)
WBC: 12.1 10*3/uL — ABNORMAL HIGH (ref 4.0–10.5)
nRBC: 0 % (ref 0.0–0.2)

## 2018-06-11 MED ORDER — HYDROCODONE-ACETAMINOPHEN 5-325 MG PO TABS: 1.0000 | ORAL_TABLET | Freq: Four times a day (QID) | ORAL | 0 refills | Status: AC | PRN

## 2018-06-11 MED ORDER — TAMSULOSIN HCL 0.4 MG PO CAPS: 0.4000 mg | ORAL_CAPSULE | Freq: Every day | ORAL | 0 refills | Status: DC

## 2018-06-11 MED ORDER — MIRTAZAPINE 15 MG PO TABS: 15.0000 mg | ORAL_TABLET | Freq: Every day | ORAL | 0 refills | Status: AC

## 2018-06-11 MED FILL — MIRTAZAPINE 15 MG TABLET: 15 | 30 days supply | Qty: 30 | Fill #0

## 2018-06-11 MED FILL — TAMSULOSIN HCL 0.4 MG CAP: 0.4 | 30 days supply | Qty: 30 | Fill #0

## 2018-06-11 NOTE — Progress Notes (Signed)
No PIV access, MD notified.

## 2018-06-11 NOTE — Progress Notes (Signed)
Provided discharge education/instructions to husband and Pt, all questions and concerns addressed, Pt not in distress, discharged with foley per MD's order, handed 4 rings to husband along with other belongings.

## 2018-06-11 NOTE — TOC Progression Note (Signed)
Transition of Care [facility]) - Progression Note    Patient Details  Name: [name] MRN: [id number] Date of Birth: [birth year]  Transition of Care [facility]) CM/SW [place], [place] Phone Number: 06/11/2018, 12:46 PM  Clinical Narrative:    Damaris Schooner with Assistant CSW director Nathaniel Man, he cannot authorize LOG at this time. Pt husband unable to afford copays, Medicaid was not approved for this pt. Spoke with RNCM and she is discussing with [name], previous [facility] agency to see if they can accept.   Expected Discharge Plan: [place] Barriers to Discharge: SNF Pending Medicaid  Expected Discharge Plan and Services Expected Discharge Plan: [place] In-house Referral: Clinical Social Work Discharge Planning Services: CM Consult Post Acute Care Choice: [place] arrangements for the past 2 months: [place] Determinants of Health (SDOH) Interventions    Readmission Risk Interventions Readmission Risk Prevention Plan 06/10/2018 04/25/2018 03/30/2018  Transportation Screening Complete Complete Complete  PCP or Specialist Appt within 5-7 Days - - Not Complete  Not Complete comments - - [place] Screening - - Not Complete  Medication Review (RN CM) - - Not Complete  Medication Review ([place]) Complete Complete -  PCP or Specialist appointment within 3-5 days of discharge Complete Complete -  [place] or Home Care Consult Complete Complete -  SW Recovery Care/Counseling Consult Complete Complete -  Palliative Care Screening Not Applicable Not Applicable -  Skilled Nursing Facility Complete Complete -  Some recent data might be hidden

## 2018-06-11 NOTE — Progress Notes (Signed)
Subjective: 4 Days Post-Op Procedure(s) (LRB): INTRAMEDULLARY (IM) NAIL INTERTROCHANTRIC (Left) Patient reports pain as minimal.  Patient was able to mobilize from the bed to the chair today.  She is working some with physical therapy.  They are recommending SNF.  Patient is doing well.  Denies significant pain in her left hip.  Some soreness with standing.  Denies any numbness or tingling in the left lower extremity.  Objective: Vital signs in last 24 hours: Temp:  [97.9 F (36.6 C)-99.3 F (37.4 C)] 98.8 F (37.1 C) (05/26 0409) Pulse Rate:  [72-85] 83 (05/26 0951) Resp:  [16-18] 18 (05/26 0409) BP: (140-162)/(65-72) 162/72 (05/26 0951) SpO2:  [97 %-100 %] 100 % (05/26 0409) Weight:  [36.6 kg] 36.6 kg (05/26 0409)  Intake/Output from previous day: 05/25 0701 - 05/26 0700 In: 477 [P.O.:477] Out: 1900 [Urine:1450] Intake/Output this shift: Total I/O In: 120 [P.O.:120] Out: -   Recent Labs    06/09/18 0330 06/10/18 0327 06/11/18 0230  HGB 8.7* 8.9* 8.2*   Recent Labs    06/10/18 0327 06/11/18 0230  WBC 13.2* 12.1*  RBC 3.43* 3.17*  HCT 27.6* 25.6*  PLT 310 308   Recent Labs    06/10/18 0327 06/11/18 0230  NA 139 141  K 4.4 4.4  CL 102 107  CO2 26 24  BUN 47* 44*  CREATININE 2.31* 1.73*  GLUCOSE 110* 107*  CALCIUM 8.1* 8.2*   Patient is awake and alert.  She is sitting up in a chair. Left hip dressings in place without saturation.  No significant pain with hip logroll.  No tenderness palpation about the knee.  Patient has active ankle dorsiflexion plantarflexion intact.  Endorses sensation grossly to light touch about the dorsal plantar foot.  Foot is warm and well-perfused.   Assessment/Plan: 4 Days Post-Op Procedure(s) (LRB): INTRAMEDULLARY (IM) NAIL INTERTROCHANTRIC (Left)  She is doing well this far.  She is able to mobilize the chair.  It appears that she has some acute blood loss anemia from surgery and her hemoglobin today is 8.2.  Her dressings  do not have any saturation. She will continue to work with physical therapy for gait and strengthening. Awaiting placement. She will continue Brilinta for DVT prophylaxis per cardiology. She will follow-up with me 2 weeks out from surgery for staple removal.  If she remains in the hospital remove staples at that time.   Erle Crocker 06/11/2018, 12:11 PM

## 2018-06-11 NOTE — Plan of Care (Signed)

## 2018-06-11 NOTE — Consult Note (Signed)
   [facility] CM Inpatient Consult   06/11/2018  [name] 01/06/1941 [id number]    Patient screened for33% extreme high risk score for unplanned readmission with 4 hospitalizations in thepast 6 months, and for possible [place] Management services as benefit [place] plan . Patient is currently active with [facility] community care management coordinator and [facility] social worker in the embedded practice ([facility]).   Per chart review and MD note dated 06/11/18 show as follows: Patient is a 78 y.o. 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 She was admitted with intertrochanteric fracture after a fall. She presented to [facility] long ED with complaints of left hip and shoulder pain after falling while trying to go to the trash can and fell against a refrigerator. EMS was called but patient declined further evaluation at that time. Patient continued with significant pain and immobility which prompted her presentation. She is now status post repair (IM nailing left intertrochanteric fracture) and for management of antiplatelet therapy surrounding surgery.   Transition of care  CM/ SW note reviewed stating that patient's husband agrees with PT/OT recommendation for SNF rehab (skilled nursing facility) at discharge. Expected discharge disposition is likely SNF- skilled nursing facility pending Medicaid approval.  Will followforprogress anddisposition.Ifthere are anydisposition changes,and needs for appropriate community follow-up,please referto [facility] care management.  Of note, [facility] Care Management services does not replace or interfere with any services that are arranged by transition of care case management or social  work.   For questions and additional information, please call:  Tenna Lacko A. Ksean Vale, BSN, RN-BC [facility] Liaison Cell: [phone]

## 2018-06-11 NOTE — TOC Transition Note (Signed)
Transition of Care [facility]) - CM/SW Discharge Note   Patient Details  Name: [name] MRN: [id number] Date of Birth: 06/22/1940  [facility] of [facility]) CM/SW Contact:  Marilu Favre, RN Phone Number: 06/11/2018, 2:34 PM   Clinical Narrative:       Final next level of care: [place] Barriers to Discharge: No Barriers Identified   Patient Goals and CMS Choice Patient states their goals for this hospitalization and ongoing recovery are:: for her to go to rehab if possible CMS Medicare.gov Compare Post Acute Care list provided to:: Patient Represenative (must comment)(spouse) Choice offered to / list presented to : Spouse  Discharge Placement                       Discharge Plan and Services In-house Referral: Clinical Social Work Discharge Planning Services: CM Consult Post Acute Care Choice: Home Health                    HH Arranged: RN, PT, OT, Nurse's Aide, Social Work [employer] Agency: [place] Date [place]: 06/11/18 Time [place]: [id number] Representative spoke with at [place]: [place] ([place]) Interventions     Readmission Risk Interventions Readmission Risk Prevention Plan 06/10/2018 04/25/2018 03/30/2018  Transportation Screening Complete Complete Complete  PCP or Specialist Appt within 5-7 Days - - Not Complete  Not Complete comments - - [place] Screening - - Not Complete  Medication Review (RN CM) - - Not Complete  Medication Review (RN Care Manager) Complete Complete -  PCP or Specialist appointment within 3-5 days of discharge Complete Complete -  [place] or Home Care Consult Complete Complete -  SW Recovery Care/Counseling Consult Complete Complete -  Palliative Care Screening Not Applicable Not Applicable -  Skilled Nursing Facility Complete Complete -  Some recent data might be hidden

## 2018-06-11 NOTE — Discharge Summary (Signed)
Physician Discharge Summary  [name] RCV:[id number] DOB: 02-02-1940 DOA: 06/06/2018  PCP: Glendale Chard, MD  Admit date: 06/06/2018 Discharge date: 06/11/2018  Admitted From: Home Disposition:  Home  Recommendations for Outpatient Follow-up:  1. Follow up with PCP in 2-3 weeks 2. Follow up with Cardiology as scheduled 3. Follow up with Orthopedic Surgery in 2 weeks 4. Follow up with Urology as scheduled  Home Health:PT, OT, RN, SW   Discharge Condition:Improved CODE STATUS:DNR Diet recommendation: Heart healthy   Brief/Interim Summary: 78 y.o.femalewith medical history significant ofdiastolic congestive heart failure, rheumatoid arthritis, CAD status post PCI/stent, HLD, history of GI bleed with gastric AVM who presents to [facility] long ED with complaints of left hip and shoulder pain. Patient reports fell yesterday when she was trying to go to the trash can and fell against a refrigerator. EMS was calledt, patient declined further evaluation at that time. Patient continued with significant pain and immobility today which prompted her presentation. Patient denies any syncopal episode and can recall event in its entirety. No other complaints at this time.  Patient denies headache, no fever/chills/night sweats, no nausea/vomiting/diarrhea, no chest pain, palpitations, no abdominal pain, no cough/congestion, no shortness of breath, no paresthesias.   Discharge Diagnoses:  Principal Problem:   Closed left hip fracture, initial encounter [facility]) Active Problems:   Rheumatoid arthritis ([place])   Hyperlipidemia   Seizure disorder ([place])   Essential hypertension   Gastric AVM   Hip fracture ([facility])  comminuted left intertrochanteric hip fracture Patient presenting after a mechanical fall on 06/05/2018 with decreased range of motion and persistent left hip pain. X-ray notable for mildly comminuted left intratrochanteric hip fracture.  --Orthopedics consulted, pt now /sp surgery  5/22 --continue with analgesic as needed --PT following, thus far rec for SNF, unfortunately patient unable to go to SNF given financial constraints. Will d/c home with home health  Chronic diastolic congestive heart failure TTEwith EF greater than 65%. Compensated. Chest x-ray without vascular congestion. --Currently stable  CAD status post PCI/stent Patient underwent PCI/stenting of her RCA on 2/13/2020by Dr. Irish Lack. Was originally on dual antiplatelet therapy with Brilinta and aspirin but subsequently developed an acute GI bleed. Currently on Brilinta 90 mg p.o. twice daily. -Pt had been continued on Brilinta given PCI/stent placed less than 6 months ago, -Cardiology following, recommends to continue current antiplatelet, Brilinta --Continuing on Coreg 25 mg p.o. twice daily as tolerated -remains stable without chest pain  Rheumatoid arthritis Home regimen includes Plaquenil 200 mg p.o. daily and prednisone 5 mg p.o. daily. --Hold Plaquenil --Continue home prednisone 5 mg p.o. daily, no indication for stress dose steroids as chronic prednisone dose less than 10 mg daily. -Currently stable  History of GI bleed, acute blood loss anemia Following PCI/stenting of her RCA in February 2020, patientwas started on dual antiplatelet therapy with aspirin and Brilinta. However,patient developed an acute GI bleed.She underwent EGD on 03/05/2018 that was notable for gastric AVM, s/p Clip.Now patient is only on Brilinta outpatient. --Suspect possible post-op anemia. Of note, prior hgb baseline had been around 8.2-8.5 one month ago -Hgb stable at 8.2  ZWC:HENIDPOE Lipitor 80 mg p.o. daily -Remains stable at this time  Hxseizure disorder:Continue Keppra 1000 mg p.o. daily -No evidence of recurrent seizure since presentation  Acute renal failure secondary to bladder out let obstruction -Cr peaked to 2.3 despite IVF -Found to have bladder outlet obstruction on bladder  US -Cont indwelling foley cath and plan to d/c with cath with bladder training after discharge -  Started flomax -Follow up Cr down to 1.7 -Have arranged Urology follow up on discharge  Discharge Instructions   Allergies as of 06/11/2018   No Known Allergies     Medication List    STOP taking these medications   furosemide 40 MG tablet Commonly known as:  LASIX   Vitamin D (Ergocalciferol) 1.25 MG (50000 UT) Caps capsule Commonly known as:  DRISDOL     TAKE these medications   acetaminophen 325 MG tablet Commonly known as:  TYLENOL Take 2 tablets (650 mg total) by mouth every 6 (six) hours as needed for mild pain (or Fever >/= 101).   atorvastatin 80 MG tablet Commonly known as:  LIPITOR Take 1 tablet (80 mg total) by mouth daily at 6 PM.   carvedilol 12.5 MG tablet Commonly known as:  COREG Take 2 tablets (25 mg total) by mouth 2 (two) times daily with a meal.   HYDROcodone-acetaminophen 5-325 MG tablet Commonly known as:  NORCO/VICODIN Take 1 tablet by mouth every 6 (six) hours as needed for up to 7 days for moderate pain.   hydroxychloroquine 200 MG tablet Commonly known as:  PLAQUENIL Take 200 mg by mouth daily.   iron polysaccharides 150 MG capsule Commonly known as:  NIFEREX Take 1 capsule (150 mg total) by mouth daily.   lactose free nutrition Liqd Take 237 mLs by mouth 3 (three) times daily between meals. What changed:  when to take this   levETIRAcetam 500 MG tablet Commonly known as:  KEPPRA TAKE 2 TABLETS BY MOUTH EVERY DAY   mirtazapine 15 MG tablet Commonly known as:  REMERON Take 1 tablet (15 mg total) by mouth at bedtime for 30 days.   nitroGLYCERIN 0.4 MG SL tablet Commonly known as:  Nitrostat Place 1 tablet (0.4 mg total) under the tongue every 5 (five) minutes as needed. What changed:  reasons to take this   ondansetron 4 MG tablet Commonly known as:  ZOFRAN Take 1 tablet (4 mg total) by mouth every 6 (six) hours as needed for  nausea.   pantoprazole 40 MG tablet Commonly known as:  PROTONIX Take 1 tablet (40 mg total) by mouth daily.   predniSONE 5 MG tablet Commonly known as:  DELTASONE Take 1 tablet (5 mg total) by mouth daily with breakfast.   tamsulosin 0.4 MG Caps capsule Commonly known as:  FLOMAX Take 1 capsule (0.4 mg total) by mouth daily for 30 days. Start taking on:  Jun 12, 2018   ticagrelor 90 MG Tabs tablet Commonly known as:  BRILINTA Take 1 tablet (90 mg total) by mouth 2 (two) times daily.   Vitamin D 125 MCG (5000 UT) Caps Take 1 capsule by mouth daily.      Follow-up Information    [facility] Follow up.   Specialty:  Home Health Services Contact information: [place] [address] [phone]        Glendale Chard, MD. Schedule an appointment as soon as possible for a visit in 2 week(s).   Specialty:  Internal Medicine Contact information: [address] [place] [address] [phone]        Jettie Booze, MD .   Specialties:  Cardiology, Radiology, Interventional Cardiology Contact information: [address] [address] [phone]        Erle Crocker, MD Follow up in 2 week(s).   Specialty:  Orthopedic Surgery Contact information: [place]  [facility] Follow up on 06/24/2018.   Why:  at 1:45pm With  Dr. Larae Grooms information: [place] [phone]         No Known Allergies  Consultations:  Orthopedic Surgery  Procedures/Studies: Dg Chest 1 View  Result Date: 06/06/2018 CLINICAL DATA:  Fall with hip fracture EXAM: CHEST  1 VIEW COMPARISON:  04/22/2018 FINDINGS: No acute opacity or pleural effusion. Stable cardiomediastinal silhouette with aortic atherosclerosis. No pneumothorax. IMPRESSION: No active disease. Electronically Signed    By: Donavan Foil M.D.   On: 06/06/2018 18:20   Dg Shoulder Left  Result Date: 06/06/2018 CLINICAL DATA:  With shoulder pain EXAM: LEFT SHOULDER - 2+ VIEW COMPARISON:  None. FINDINGS: AC joint is intact.  No fracture or dislocation. IMPRESSION: No acute osseous abnormality Electronically Signed   By: Donavan Foil M.D.   On: 06/06/2018 18:19   Dg C-arm 1-60 Min  Result Date: 06/07/2018 CLINICAL DATA:  ORIF of the left femur. EXAM: LEFT FEMUR 2 VIEWS; DG C-ARM 61-120 MIN COMPARISON:  Left hip radiographs 04/06/2018 FINDINGS: Six intraoperative views are submitted. The intratrochanteric fracture is reduced. Intramedullary rod with dynamic hip screw is placed. A single distal interlocking screws in place. Alignment is anatomic. IMPRESSION: 1. Left femur ORIF without radiographic evidence for complication. Electronically Signed   By: San Morelle M.D.   On: 06/07/2018 13:28   Dg Hip Unilat W Or Wo Pelvis 2-3 Views Left  Result Date: 06/06/2018 CLINICAL DATA:  Fall EXAM: DG HIP (WITH OR WITHOUT PELVIS) 2-3V LEFT COMPARISON:  CT 03/29/2018 FINDINGS: Acute mildly comminuted left intertrochanteric fracture. Left femoral head projects in joint. Mild arthritis of both hips. Pubic symphysis is intact IMPRESSION: Acute mildly comminuted left intertrochanteric fracture Electronically Signed   By: Donavan Foil M.D.   On: 06/06/2018 18:18   Dg Femur Min 2 Views Left  Result Date: 06/07/2018 CLINICAL DATA:  ORIF of the left femur. EXAM: LEFT FEMUR 2 VIEWS; DG C-ARM 61-120 MIN COMPARISON:  Left hip radiographs 04/06/2018 FINDINGS: Six intraoperative views are submitted. The intratrochanteric fracture is reduced. Intramedullary rod with dynamic hip screw is placed. A single distal interlocking screws in place. Alignment is anatomic. IMPRESSION: 1. Left femur ORIF without radiographic evidence for complication. Electronically Signed   By: San Morelle M.D.   On: 06/07/2018 13:28     Subjective: Feels better today  Discharge Exam: Vitals:   06/11/18 0951 06/11/18 1324  BP: (!) 162/72 121/78  Pulse: 83 73  Resp:  16  Temp:  98.3 F (36.8 C)  SpO2:  99%   Vitals:   06/10/18 2112 06/11/18 0409 06/11/18 0951 06/11/18 1324  BP: 140/65 (!) 147/69 (!) 162/72 121/78  Pulse: 85 72 83 73  Resp: 16 18  16   Temp: 99.3 F (37.4 C) 98.8 F (37.1 C)  98.3 F (36.8 C)  TempSrc: Oral Oral  Oral  SpO2: 97% 100%  99%  Weight:  36.6 kg    Height:        General: Pt is alert, awake, not in acute distress Cardiovascular: RRR, S1/S2 +, no rubs, no gallops Respiratory: CTA bilaterally, no wheezing, no rhonchi Abdominal: Soft, NT, ND, bowel sounds + Extremities: no edema, no cyanosis   The results of significant diagnostics from this hospitalization (including imaging, microbiology, ancillary and laboratory) are listed below for reference.     Microbiology: Recent Results (from  the past 240 hour(s))  SARS Coronavirus 2 (CEPHEID - Performed in [place] hospital lab), Hosp Order     Status: None   Collection Time: 06/06/18  6:39 PM  Result Value Ref Range Status   SARS Coronavirus 2 NEGATIVE NEGATIVE Final    Comment: (NOTE) If result is NEGATIVE SARS-CoV-2 target nucleic acids are NOT DETECTED. The SARS-CoV-2 RNA is generally detectable in upper and lower  respiratory specimens during the acute phase of infection. The lowest  concentration of SARS-CoV-2 viral copies this assay can detect is 250  copies / mL. A negative result does not preclude SARS-CoV-2 infection  and should not be used as the sole basis for treatment or other  patient management decisions.  A negative result may occur with  improper specimen collection / handling, submission of specimen other  than nasopharyngeal swab, presence of viral mutation(s) within the  areas targeted by this assay, and inadequate number of viral copies  (<250 copies / mL). A negative result must be combined with  clinical  observations, patient history, and epidemiological information. If result is POSITIVE SARS-CoV-2 target nucleic acids are DETECTED. The SARS-CoV-2 RNA is generally detectable in upper and lower  respiratory specimens dur ing the acute phase of infection.  Positive  results are indicative of active infection with SARS-CoV-2.  Clinical  correlation with patient history and other diagnostic information is  necessary to determine patient infection status.  Positive results do  not rule out bacterial infection or co-infection with other viruses. If result is PRESUMPTIVE POSTIVE SARS-CoV-2 nucleic acids MAY BE PRESENT.   A presumptive positive result was obtained on the submitted specimen  and confirmed on repeat testing.  While 2019 novel coronavirus  (SARS-CoV-2) nucleic acids may be present in the submitted sample  additional confirmatory testing may be necessary for epidemiological  and / or clinical management purposes  to differentiate between  SARS-CoV-2 and other Sarbecovirus currently known to infect humans.  If clinically indicated additional testing with an alternate test  methodology [id number]) is advised. The SARS-CoV-2 RNA is generally  detectable in upper and lower respiratory sp ecimens during the acute  phase of infection. The expected result is Negative. Fact Sheet for Patients:  StrictlyIdeas.no Fact Sheet for Healthcare Providers: BankingDealers.co.za This test is not yet approved or cleared by the [place] FDA and has been authorized for detection and/or diagnosis of SARS-CoV-2 by FDA under an Emergency Use Authorization (EUA).  This EUA will remain in effect (meaning this test can be used) for the duration of the COVID-19 declaration under Section 564(b)(1) of the Act, 21 U.S.C. section 360bbb-3(b)(1), unless the authorization is terminated or revoked sooner. Performed at [facility],  [place] [address], [place], [place] [address]   Surgical pcr screen     Status: None   Collection Time: 06/06/18 11:26 PM  Result Value Ref Range Status   MRSA, PCR NEGATIVE NEGATIVE Final   Staphylococcus aureus NEGATIVE NEGATIVE Final    Comment: (NOTE) The Xpert SA Assay (FDA approved for NASAL specimens in patients 52 years of age and older), is one component of a comprehensive surveillance program. It is not intended to diagnose infection nor to guide or monitor treatment. Performed at [facility], [place] [address], [place], [place] [address]      Labs: BNP (last 3 results) Recent Labs    04/21/18 0627 04/22/18 0028  BNP 585.7* 17.6   Basic Metabolic Panel: Recent Labs  Lab 06/06/18 1849 06/07/18  0143 06/09/18 0330 06/10/18 0327 06/11/18 0230  NA 142 141 137 139 141  K 4.8 4.4 4.5 4.4 4.4  CL 110 109 101 102 107  CO2 22 21* 24 26 24   GLUCOSE 96 222* 143* 110* 107*  BUN 24* 22 41* 47* 44*  CREATININE 1.48* 1.63* 2.14* 2.31* 1.73*  CALCIUM 8.9 8.6* 7.9* 8.1* 8.2*   Liver Function Tests: Recent Labs  Lab 06/10/18 0327  AST 17  ALT 6  ALKPHOS 91  BILITOT 0.7  PROT 6.7  ALBUMIN 2.7*   No results for input(s): LIPASE, AMYLASE in the last 168 hours. No results for input(s): AMMONIA in the last 168 hours. CBC: Recent Labs  Lab 06/06/18 1849 06/07/18 0143 06/09/18 0330 06/10/18 0327 06/11/18 0230  WBC 12.3* 12.2* 13.5* 13.2* 12.1*  NEUTROABS 9.7*  --   --   --   --   HGB 11.6* 10.7* 8.7* 8.9* 8.2*  HCT 37.5 33.2* 26.4* 27.6* 25.6*  MCV 83.5 80.4 80.0 80.5 80.8  PLT 311 279 287 310 308   Cardiac Enzymes: No results for input(s): CKTOTAL, CKMB, CKMBINDEX, TROPONINI in the last 168 hours. BNP: Invalid input(s): POCBNP CBG: No results for input(s): GLUCAP in the last 168 hours. D-Dimer No results for input(s): DDIMER in the last 72 hours. Hgb A1c No results for input(s): HGBA1C in the last 72 hours. Lipid Profile No results for input(s):  CHOL, HDL, LDLCALC, TRIG, CHOLHDL, LDLDIRECT in the last 72 hours. Thyroid function studies No results for input(s): TSH, T4TOTAL, T3FREE, THYROIDAB in the last 72 hours.  Invalid input(s): FREET3 Anemia work up No results for input(s): VITAMINB12, FOLATE, FERRITIN, TIBC, IRON, RETICCTPCT in the last 72 hours. Urinalysis    Component Value Date/Time   COLORURINE STRAW (A) 04/21/2018 0907   APPEARANCEUR CLEAR 04/21/2018 0907   LABSPEC 1.005 04/21/2018 0907   PHURINE 6.0 04/21/2018 0907   GLUCOSEU NEGATIVE 04/21/2018 0907   HGBUR SMALL (A) 04/21/2018 0907   BILIRUBINUR NEGATIVE 04/21/2018 0907   KETONESUR NEGATIVE 04/21/2018 0907   PROTEINUR NEGATIVE 04/21/2018 0907   UROBILINOGEN 1.0 01/12/2014 2237   NITRITE NEGATIVE 04/21/2018 0907   LEUKOCYTESUR TRACE (A) 04/21/2018 0907   Sepsis Labs Invalid input(s): PROCALCITONIN,  WBC,  LACTICIDVEN Microbiology Recent Results (from the past 240 hour(s))  SARS Coronavirus 2 (CEPHEID - Performed in [place] hospital lab), Hosp Order     Status: None   Collection Time: 06/06/18  6:39 PM  Result Value Ref Range Status   SARS Coronavirus 2 NEGATIVE NEGATIVE Final    Comment: (NOTE) If result is NEGATIVE SARS-CoV-2 target nucleic acids are NOT DETECTED. The SARS-CoV-2 RNA is generally detectable in upper and lower  respiratory specimens during the acute phase of infection. The lowest  concentration of SARS-CoV-2 viral copies this assay can detect is 250  copies / mL. A negative result does not preclude SARS-CoV-2 infection  and should not be used as the sole basis for treatment or other  patient management decisions.  A negative result may occur with  improper specimen collection / handling, submission of specimen other  than nasopharyngeal swab, presence of viral mutation(s) within the  areas targeted by this assay, and inadequate number of viral copies  (<250 copies / mL). A negative result must be combined with clinical   observations, patient history, and epidemiological information. If result is POSITIVE SARS-CoV-2 target nucleic acids are DETECTED. The SARS-CoV-2 RNA is generally detectable in upper and lower  respiratory specimens dur ing the acute  phase of infection.  Positive  results are indicative of active infection with SARS-CoV-2.  Clinical  correlation with patient history and other diagnostic information is  necessary to determine patient infection status.  Positive results do  not rule out bacterial infection or co-infection with other viruses. If result is PRESUMPTIVE POSTIVE SARS-CoV-2 nucleic acids MAY BE PRESENT.   A presumptive positive result was obtained on the submitted specimen  and confirmed on repeat testing.  While 2019 novel coronavirus  (SARS-CoV-2) nucleic acids may be present in the submitted sample  additional confirmatory testing may be necessary for epidemiological  and / or clinical management purposes  to differentiate between  SARS-CoV-2 and other Sarbecovirus currently known to infect humans.  If clinically indicated additional testing with an alternate test  methodology [phone]) is advised. The SARS-CoV-2 RNA is generally  detectable in upper and lower respiratory sp ecimens during the acute  phase of infection. The expected result is Negative. Fact Sheet for Patients:  StrictlyIdeas.no Fact Sheet for Healthcare Providers: BankingDealers.co.za This test is not yet approved or cleared by the [place] FDA and has been authorized for detection and/or diagnosis of SARS-CoV-2 by FDA under an Emergency Use Authorization (EUA).  This EUA will remain in effect (meaning this test can be used) for the duration of the COVID-19 declaration under Section 564(b)(1) of the Act, 21 U.S.C. section 360bbb-3(b)(1), unless the authorization is terminated or revoked sooner. Performed at [facility], [place]  [address] [place], [place] [address]   Surgical pcr screen     Status: None   Collection Time: 06/06/18 11:26 PM  Result Value Ref Range Status   MRSA, PCR NEGATIVE NEGATIVE Final   Staphylococcus aureus NEGATIVE NEGATIVE Final    Comment: (NOTE) The Xpert SA Assay (FDA approved for NASAL specimens in patients 54 years of age and older), is one component of a comprehensive surveillance program. It is not intended to diagnose infection nor to guide or monitor treatment. Performed at [facility], [place] [address] [place], [place] [address]    Time spent: 30 min  SIGNED:   Marylu Lund, MD  [facility] 06/11/2018, 3:13 PM  If 7PM-7AM, please contact night-coverage

## 2018-06-11 NOTE — Progress Notes (Signed)
Progress Note  Patient Name: [name] Date of Encounter: 06/11/2018  Primary Cardiologist: Larae Grooms, MD   Subjective   No CP or dyspnea  Inpatient Medications    Scheduled Meds: . atorvastatin  80 mg Oral q1800  . carvedilol  25 mg Oral BID WC  . feeding supplement (ENSURE ENLIVE)  237 mL Oral Q1500  . iron polysaccharides  150 mg Oral Daily  . levETIRAcetam  1,000 mg Oral Daily  . mirtazapine  15 mg Oral QHS  . multivitamin with minerals  1 tablet Oral Daily  . pantoprazole  40 mg Oral Daily  . predniSONE  5 mg Oral Q breakfast  . tamsulosin  0.4 mg Oral Daily  . ticagrelor  90 mg Oral BID   Continuous Infusions:  PRN Meds: HYDROcodone-acetaminophen, morphine injection, ondansetron (ZOFRAN) IV, senna-docusate   Vital Signs    Vitals:   06/10/18 1500 06/10/18 2112 06/11/18 0409 06/11/18 0951  BP: 140/72 140/65 (!) 147/69 (!) 162/72  Pulse: 73 85 72 83  Resp: 16 16 18    Temp: 97.9 F (36.6 C) 99.3 F (37.4 C) 98.8 F (37.1 C)   TempSrc: Axillary Oral Oral   SpO2: 98% 97% 100%   Weight:   36.6 kg   Height:        Intake/Output Summary (Last 24 hours) at 06/11/2018 1003 Last data filed at 06/11/2018 0419 Gross per 24 hour  Intake 177 ml  Output 1450 ml  Net -1273 ml   Last 3 Weights 06/11/2018 06/10/2018 06/09/2018  Weight (lbs) 80 lb 11 oz 101 lb 10.1 oz 103 lb 6.3 oz  Weight (kg) 36.6 kg 46.1 kg 46.9 kg       Physical Exam   GEN: No acute distress.  Sitting up in chair Neck: No JVD Cardiac: RRR, 8-7/6 systolic murmur Respiratory: Clear to auscultation bilaterally. GI: Soft, nontender, non-distended  MS: No edema Neuro:  Nonfocal  Psych: Normal affect   Labs    Chemistry Recent Labs  Lab 06/09/18 0330 06/10/18 0327 06/11/18 0230  NA 137 139 141  K 4.5 4.4 4.4  CL 101 102 107  CO2 24 26 24   GLUCOSE 143* 110* 107*  BUN 41* 47* 44*  CREATININE 2.14* 2.31* 1.73*  CALCIUM 7.9* 8.1* 8.2*  PROT  --  6.7  --   ALBUMIN  --   2.7*  --   AST  --  17  --   ALT  --  6  --   ALKPHOS  --  91  --   BILITOT  --  0.7  --   GFRNONAA 21* 19* 28*  GFRAA 25* 23* 32*  ANIONGAP 12 11 10      Hematology Recent Labs  Lab 06/09/18 0330 06/10/18 0327 06/11/18 0230  WBC 13.5* 13.2* 12.1*  RBC 3.30* 3.43* 3.17*  HGB 8.7* 8.9* 8.2*  HCT 26.4* 27.6* 25.6*  MCV 80.0 80.5 80.8  MCH 26.4 25.9* 25.9*  MCHC 33.0 32.2 32.0  RDW 18.0* 18.2* 18.0*  PLT 287 310 308    Radiology    No results found.  Patient Profile     78 y.o. 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  Assessment & Plan    1  coronary artery disease-continue Brilinta at present dose.  Continue statin and carvedilol.  Hemoglobin is relatively stable today.  2 status post hip fracture-management per orthopedics.  [facility] will sign off.   Medication Recommendations:  As noted in [name] Other recommendations (labs, testing, etc):  none Follow up as an outpatient:  With Dr Irish Lack in 2-3 months.   For questions or updates, please contact [place] Please consult [email or web address] for contact info under        Signed, Peter [place], MD  06/11/2018, 10:03 AM

## 2018-06-11 NOTE — TOC Progression Note (Signed)
Transition of Care [facility]) - Progression Note    Patient Details  Name: [name] MRN: [id number] Date of Birth: 12/20/1940  Transition of Care [facility]) CM/SW Contact  Jacalyn Lefevre Edson Snowball, RN Phone Number: 06/11/2018, 1:49 PM  Clinical Narrative:     Spoke to patient's husband [name] [phone]. For patient to go to SNF she would be in co pay days. Husband cannot afford co pay days.   Husband will take her home with home health, wants to continue with [facility]. Spoke to [place] with [facility] and he will accept patient back.    Patient already has wheelchair, crutches, walker and hospital bed at home.   Offered PTAR transportation home, husband declined , he will pick her up at discharge. If patient discharged today he is ready to take her home. John requesting MD To call him with update.   Sent Chat message to Dr Wyline Copas for home health orders and face to face and to call husband.  Expected Discharge Plan: [place] Barriers to Discharge: No Barriers Identified  Expected Discharge Plan and Services Expected Discharge Plan: [place] In-house Referral: Clinical Social Work Discharge Planning Services: CM Consult Post Acute Care Choice: [place] arrangements for the past 2 months: Single Family Home                                       Social Determinants of Health (SDOH) Interventions    Readmission Risk Interventions Readmission Risk Prevention Plan 06/10/2018 04/25/2018 03/30/2018  Transportation Screening Complete Complete Complete  PCP or Specialist Appt within 5-7 Days - - Not Complete  Not Complete comments - - [place] Screening - - Not Complete  Medication Review (RN CM) - - Not Complete  Medication Review ([place]) Complete Complete -  PCP or Specialist appointment within 3-5 days of discharge Complete Complete -  [place] or Home Care Consult Complete Complete -  SW Recovery  Care/Counseling Consult Complete Complete -  Palliative Care Screening Not Applicable Not Applicable -  Skilled Nursing Facility Complete Complete -  Some recent data might be hidden

## 2018-06-11 NOTE — Progress Notes (Signed)
PROGRESS NOTE    [name]  OFB:[id number] DOB: 1940/03/09 DOA: 06/06/2018 PCP: Glendale Chard, MD    Brief Narrative:  78 y.o. female with medical history significant of diastolic congestive heart failure, rheumatoid arthritis, CAD status post PCI/stent, HLD, history of GI bleed with gastric AVM who presents to [facility] ED with complaints of left hip and shoulder pain.  Patient reports fell yesterday when she was trying to go to the trash can and fell against a refrigerator.  EMS was calledt, patient declined further evaluation at that time.  Patient continued with significant pain and immobility today which prompted her presentation.  Patient denies any syncopal episode and can recall event in its entirety.  No other complaints at this time.  Patient denies headache, no fever/chills/night sweats, no nausea/vomiting/diarrhea, no chest pain, palpitations, no abdominal pain, no cough/congestion, no shortness of breath, no paresthesias.  Assessment & Plan:   Principal Problem:   Closed left hip fracture, initial encounter [facility]) Active Problems:   Rheumatoid arthritis ([place])   Hyperlipidemia   Seizure disorder ([place])   Essential hypertension   Gastric AVM   Hip fracture (H[facility])   comminuted left intertrochanteric hip fracture Patient presenting after a mechanical fall on 06/05/2018 with decreased range of motion and persistent left hip pain.  X-ray notable for mildly comminuted left intratrochanteric hip fracture.  --Orthopedics consulted, pt now /sp surgery 5/22 --continue with analgesic as needed --PT following, thus far rec for SNF, SW continues to follow  Chronic diastolic congestive heart failure TTE with EF greater than 65%.  Compensated.  Chest x-ray without vascular congestion. --Currently stable  CAD status post PCI/stent Patient underwent PCI/stenting of her RCA on 02/28/2018 by Dr. Irish Lack.  Was originally on dual antiplatelet therapy with Brilinta and aspirin but  subsequently developed an acute GI bleed.  Currently on Brilinta 90 mg p.o. twice daily. -Pt had been continued on Brilinta given PCI/stent placed less than 6 months ago, -Cardiology following, recommends to continue current antiplatelet, Brilinta --Continuing on Coreg 25 mg p.o. twice daily as tolerated -remains stable without chest pain  Rheumatoid arthritis Home regimen includes Plaquenil 200 mg p.o. daily and prednisone 5 mg p.o. daily. --Hold Plaquenil --Continue home prednisone 5 mg p.o. daily, no indication for stress dose steroids as chronic prednisone dose less than 10 mg daily. -Currently stable  History of GI bleed, acute blood loss anemia Following PCI/stenting of her RCA in February 2020, patient was started on dual antiplatelet therapy with aspirin and Brilinta.  However, patient developed an acute GI bleed.  She underwent EGD on 03/05/2018 that was notable for gastric AVM, s/p Clip.  Now patient is only on Brilinta outpatient. --Suspect possible post-op anemia. Of note, prior hgb baseline had been around 8.2-8.5 one month ago -Hgb stable at 8.2  HLD: Continue Lipitor 80 mg p.o. daily -Remains stable at this time  Hx seizure disorder: Continue Keppra 1000 mg p.o. daily -No evidence of recurrent seizure since presentation  Acute renal failure secondary to bladder out let obstruction -Cr peaked to 2.3 despite IVF -Found to have bladder outlet obstruction on bladder US -Cont indwelling foley cath and plan to d/c with cath with bladder training after discharge -Started flomax -Follow up Cr down to 1.7 -Repeat bmet in AM  DVT prophylaxis: Will order SCD's Code Status: DNR Family Communication: Pt in room, family not at bedside Disposition Plan: SNF when bed available and pending medicaid  Consultants:   Orthopedic Surgery  Cardiology  Procedures:  Cephalo-medullary nail of left intertrochanteric hip fracture on 5/22  Antimicrobials: Anti-infectives (From  admission, onward)   Start     Dose/Rate Route Frequency Ordered Stop   06/07/18 2000  ceFAZolin (ANCEF) IVPB 1 g/50 mL premix     1 g 100 mL/hr over 30 Minutes Intravenous Every 12 hours 06/07/18 1209 06/08/18 0922   06/07/18 0800  ceFAZolin (ANCEF) IVPB 2g/100 mL premix     2 g 200 mL/hr over 30 Minutes Intravenous To Short Stay 06/07/18 0203 06/07/18 1017      Subjective: Reports feeling better today  Objective: Vitals:   06/10/18 2112 06/11/18 0409 06/11/18 0951 06/11/18 1324  BP: 140/65 (!) 147/69 (!) 162/72 121/78  Pulse: 85 72 83 73  Resp: 16 18  16   Temp: 99.3 F (37.4 C) 98.8 F (37.1 C)  98.3 F (36.8 C)  TempSrc: Oral Oral  Oral  SpO2: 97% 100%  99%  Weight:  36.6 kg    Height:        Intake/Output Summary (Last 24 hours) at 06/11/2018 1337 Last data filed at 06/11/2018 0900 Gross per 24 hour  Intake 297 ml  Output 1200 ml  Net -903 ml   Filed Weights   06/09/18 0500 06/10/18 0500 06/11/18 0409  Weight: 46.9 kg 46.1 kg 36.6 kg    Examination: General exam: Conversant, in no acute distress Respiratory system: normal chest rise, clear, no audible wheezing Cardiovascular system: regular rhythm, s1-s2 Gastrointestinal system: Nondistended, nontender, pos BS Central nervous system: No seizures, no tremors Extremities: No cyanosis, no joint deformities Skin: No rashes, no pallor Psychiatry: Affect normal // no auditory hallucinations   Data Reviewed: I have personally reviewed following labs and imaging studies  CBC: Recent Labs  Lab 06/06/18 1849 06/07/18 0143 06/09/18 0330 06/10/18 0327 06/11/18 0230  WBC 12.3* 12.2* 13.5* 13.2* 12.1*  NEUTROABS 9.7*  --   --   --   --   HGB 11.6* 10.7* 8.7* 8.9* 8.2*  HCT 37.5 33.2* 26.4* 27.6* 25.6*  MCV 83.5 80.4 80.0 80.5 80.8  PLT 311 279 287 310 220   Basic Metabolic Panel: Recent Labs  Lab 06/06/18 1849 06/07/18 0143 06/09/18 0330 06/10/18 0327 06/11/18 0230  NA 142 141 137 139 141  K 4.8 4.4  4.5 4.4 4.4  CL 110 109 101 102 107  CO2 22 21* 24 26 24   GLUCOSE 96 222* 143* 110* 107*  BUN 24* 22 41* 47* 44*  CREATININE 1.48* 1.63* 2.14* 2.31* 1.73*  CALCIUM 8.9 8.6* 7.9* 8.1* 8.2*   GFR: Estimated Creatinine Clearance: 15.2 mL/min (A) (by C-G formula based on SCr of 1.73 mg/dL (H)). Liver Function Tests: Recent Labs  Lab 06/10/18 0327  AST 17  ALT 6  ALKPHOS 91  BILITOT 0.7  PROT 6.7  ALBUMIN 2.7*   No results for input(s): LIPASE, AMYLASE in the last 168 hours. No results for input(s): AMMONIA in the last 168 hours. Coagulation Profile: Recent Labs  Lab 06/06/18 2345  INR 1.3*   Cardiac Enzymes: No results for input(s): CKTOTAL, CKMB, CKMBINDEX, TROPONINI in the last 168 hours. BNP (last 3 results) No results for input(s): PROBNP in the last 8760 hours. HbA1C: No results for input(s): HGBA1C in the last 72 hours. CBG: No results for input(s): GLUCAP in the last 168 hours. Lipid Profile: No results for input(s): CHOL, HDL, LDLCALC, TRIG, CHOLHDL, LDLDIRECT in the last 72 hours. Thyroid Function Tests: No results for input(s): TSH, T4TOTAL, FREET4, T3FREE, THYROIDAB in the  last 72 hours. Anemia Panel: No results for input(s): VITAMINB12, FOLATE, FERRITIN, TIBC, IRON, RETICCTPCT in the last 72 hours. Sepsis Labs: No results for input(s): PROCALCITON, LATICACIDVEN in the last 168 hours.  Recent Results (from the past 240 hour(s))  SARS Coronavirus 2 (CEPHEID - Performed in [place] hospital lab), Hosp Order     Status: None   Collection Time: 06/06/18  6:39 PM  Result Value Ref Range Status   SARS Coronavirus 2 NEGATIVE NEGATIVE Final    Comment: (NOTE) If result is NEGATIVE SARS-CoV-2 target nucleic acids are NOT DETECTED. The SARS-CoV-2 RNA is generally detectable in upper and lower  respiratory specimens during the acute phase of infection. The lowest  concentration of SARS-CoV-2 viral copies this assay can detect is 250  copies / mL. A negative  result does not preclude SARS-CoV-2 infection  and should not be used as the sole basis for treatment or other  patient management decisions.  A negative result may occur with  improper specimen collection / handling, submission of specimen other  than nasopharyngeal swab, presence of viral mutation(s) within the  areas targeted by this assay, and inadequate number of viral copies  (<250 copies / mL). A negative result must be combined with clinical  observations, patient history, and epidemiological information. If result is POSITIVE SARS-CoV-2 target nucleic acids are DETECTED. The SARS-CoV-2 RNA is generally detectable in upper and lower  respiratory specimens dur ing the acute phase of infection.  Positive  results are indicative of active infection with SARS-CoV-2.  Clinical  correlation with patient history and other diagnostic information is  necessary to determine patient infection status.  Positive results do  not rule out bacterial infection or co-infection with other viruses. If result is PRESUMPTIVE POSTIVE SARS-CoV-2 nucleic acids MAY BE PRESENT.   A presumptive positive result was obtained on the submitted specimen  and confirmed on repeat testing.  While 2019 novel coronavirus  (SARS-CoV-2) nucleic acids may be present in the submitted sample  additional confirmatory testing may be necessary for epidemiological  and / or clinical management purposes  to differentiate between  SARS-CoV-2 and other Sarbecovirus currently known to infect humans.  If clinically indicated additional testing with an alternate test  methodology [phone]) is advised. The SARS-CoV-2 RNA is generally  detectable in upper and lower respiratory sp ecimens during the acute  phase of infection. The expected result is Negative. Fact Sheet for Patients:  StrictlyIdeas.no Fact Sheet for Healthcare Providers: BankingDealers.co.za This test is not yet  approved or cleared by the [place] FDA and has been authorized for detection and/or diagnosis of SARS-CoV-2 by FDA under an Emergency Use Authorization (EUA).  This EUA will remain in effect (meaning this test can be used) for the duration of the COVID-19 declaration under Section 564(b)(1) of the Act, 21 U.S.C. section 360bbb-3(b)(1), unless the authorization is terminated or revoked sooner. Performed at [facility], [place] [address], [place], [place] [address]   Surgical pcr screen     Status: None   Collection Time: 06/06/18 11:26 PM  Result Value Ref Range Status   MRSA, PCR NEGATIVE NEGATIVE Final   Staphylococcus aureus NEGATIVE NEGATIVE Final    Comment: (NOTE) The Xpert SA Assay (FDA approved for NASAL specimens in patients 85 years of age and older), is one component of a comprehensive surveillance program. It is not intended to diagnose infection nor to guide or monitor treatment. Performed at [facility], [place],  [place] [address]      Radiology Studies: No results found.  Scheduled Meds: . atorvastatin  80 mg Oral q1800  . carvedilol  25 mg Oral BID WC  . feeding supplement (ENSURE ENLIVE)  237 mL Oral Q1500  . iron polysaccharides  150 mg Oral Daily  . levETIRAcetam  1,000 mg Oral Daily  . mirtazapine  15 mg Oral QHS  . multivitamin with minerals  1 tablet Oral Daily  . pantoprazole  40 mg Oral Daily  . predniSONE  5 mg Oral Q breakfast  . tamsulosin  0.4 mg Oral Daily  . ticagrelor  90 mg Oral BID   Continuous Infusions:    LOS: 5 days   Marylu Lund, MD [facility] Pager On Amion  If 7PM-7AM, please contact night-coverage 06/11/2018, 1:37 PM

## 2018-06-12 ENCOUNTER — Telehealth: Payer: Self-pay

## 2018-06-12 ENCOUNTER — Ambulatory Visit: Payer: Self-pay

## 2018-06-12 ENCOUNTER — Other Ambulatory Visit: Payer: Self-pay

## 2018-06-12 DIAGNOSIS — I5032 Chronic diastolic (congestive) heart failure: Secondary | ICD-10-CM

## 2018-06-12 DIAGNOSIS — M059 Rheumatoid arthritis with rheumatoid factor, unspecified: Secondary | ICD-10-CM

## 2018-06-12 DIAGNOSIS — I2510 Atherosclerotic heart disease of native coronary artery without angina pectoris: Secondary | ICD-10-CM | POA: Diagnosis not present

## 2018-06-12 DIAGNOSIS — I130 Hypertensive heart and chronic kidney disease with heart failure and stage 1 through stage 4 chronic kidney disease, or unspecified chronic kidney disease: Secondary | ICD-10-CM | POA: Diagnosis not present

## 2018-06-12 DIAGNOSIS — R5381 Other malaise: Secondary | ICD-10-CM

## 2018-06-12 DIAGNOSIS — I25119 Atherosclerotic heart disease of native coronary artery with unspecified angina pectoris: Secondary | ICD-10-CM | POA: Diagnosis not present

## 2018-06-12 DIAGNOSIS — N183 Chronic kidney disease, stage 3 (moderate): Secondary | ICD-10-CM | POA: Diagnosis not present

## 2018-06-12 NOTE — Consult Note (Signed)
   [facility] Inpatient Consult   06/12/2018  [name] [birth year] [id number]    Follow-up note:  Following patient for disposition and per chart review, she was recommended by PT for SNF(skilled nursing facility), unfortunately patient unable to go to SNF given financial constraints (can not afford co-pay days) and Medicaid denied authorization.  Transition of care CM note states that patient was discharged home with home health, instead. (RN, PT, OT, Nurse's Aide, Social Work with [facility]). Patient is currently active with the [facility] care management and was made aware of disposition. Patient will receive a post hospital call and will be evaluated for assessments and disease process education. Will notify inpatient [facility] team to make aware that [place] [facility] is following patient in the community.  For questions and additional information, please contact:  Keonna Raether A. Kasyn Rolph, BSN, RN-BC [facility] Liaison Cell: [phone]

## 2018-06-12 NOTE — Telephone Encounter (Signed)
Phone call placed to patient's husband to check in and to offer to schedule visit with Palliative care NP. VM left

## 2018-06-12 NOTE — Chronic Care Management (AMB) (Signed)
Chronic Care Management    Clinical Social Work Follow Up Note  06/12/2018 Name: TASHAYA ANCRUM MRN: [id number] DOB: 06/08/1940  [name] is a 78 y.o. year old female who is a primary care patient of Glendale Chard, MD. The CCM team was consulted for assistance with Community Resources and Level of Care Concerns.   Review of patient status, including review of consultants reports, other relevant assessments, and collaboration with appropriate care team members and the patient's provider was performed as part of comprehensive patient evaluation and provision of chronic care management services.    Successful outbound call to the patients spouse Diania Co regarding progression of patient stated goals.   Goals Addressed            This Visit's Progress     Patient Stated   . "I need a ramp" (pt-stated)       Current Barriers:  . Financial constraints . Lacks knowledge of community resource: to assist with ramp installation  Clinical Social Work Clinical Goal(s):  [name] Over the next 30 days, client will work with SW to address concerns related to the ability to exit her home safely - . Over the next 60 days, the patient will follow up with community agencies, as directed by SW  CCM SW Interventions: Completed 06/12/18  Outbound call to the patients husband regarding patient stated goal  Reviewed progress of referral source  Collaboration e-mail correspondence sent to Michel Harrow, [place] Living Coordinator regarding progress of home modification referral   Patient Self Care Activities:  . Currently UNABLE TO independently exit her home due to concerns with mobility. The patient relies on her spouse and grand-children for assistance   Please see past updates related to this goal by clicking on the "Past Updates" button in the selected goal      . "I need help with transportation" (pt-stated)   On track    Current Barriers:  . ADL IADL limitations -  the patient reports difficulty leaving her home due to mobility . Limited access to caregiver - the patient reports her spouse provides transportation to all medical appointments. Unfortunately, he is only available to provide transportation on Monday's  Clinical Social Work Clinical Goal(s):  [name] Over the next 45 days, client will work with SW to address concerns related to lack of transportation resources Not met- re-established 06/12/18  CCM SW Interventions: Completed 06/12/18  Outbound call to the patients spouse Shermaine Rivet to discuss patient stated goal  Determined the patient had an [facility] appointment in the next two weeks and would need wheelchair transport due to recent hip fracture  Educated Mr Starke on the limitations due the patinets inability to safely exit the home  Determined Mr Miranda would arrange to have someone assist with helping the patient to safely exit the home.   Advised Mr Custodio CCM SW would follow up with [facility] to determine patients coverage of transportation benefit  Outbound call to [employer] to determine the patient does not have Public relations account executive with community resource [facility] to determine the patient is still an active recipient and eligible to utilize the [employer] with CCM RN Case Manager regarding concerns of patients husband assisting the patient out of the home based on recent inpatient surgery  Collaboration with patients primary care physician regarding patient stated goal  Patient Self Care Activities:  . Currently UNABLE TO independently leave her home to access transportation resources  Please see past updates related to this goal by clicking on the "Past Updates" button in the selected goal        Other   . "I need help caring for my wife"       Spouse states  Current Barriers:  [name] Knowledge Deficits related to potential complications secondary to immobility   . Lacks caregiver support  . Chronic Disease Management support and education needs related to how to provide total care, obtain DME and utilize the appropriate resources available  Nurse Case Manager Clinical Goal(s):  [name] Over the next 30 days, patient/spouse will verbalize understanding of plan for obtaining DME.  . Over the next 60 days, patient/spouse will verbalize understanding of Palliative Care Services.  . Over the next 90 days, patient will work with the CCM team to improve knowledge and understanding about potential complications associated with patient's impaired physical mobility including complications related to skin breakdown, and pneumonia, malnutrition and DVT.   CCM RN Interventions:  Completed 06/06/18  Completed call with spouse Blaine Hamper  Assessed for long term care plan for patient - Mr. Bartel states he plans to retire at the end of the month and will care for Mrs. Mccanless full time-he plans to continue to rely on family (grandson and daughter) until then- he states his grandson lives with him and is at the home with Mrs. Gilday daily   Confirmed grandson will be present until someone relieves him, confirms grandson will be able to provide patient meals, assist her to the restroom and ensure she is dry and clean and has all health care needs met including turning and shifting weight and provided fluids throughout the day in order to stay well hydrated  Discussed status of DME - Mr. Goodloe states he has communicated with [place] and the DME will be delivered to the home on Monday, 06/10/18  Discussed status of Palliative Care referral - Mr. Lamia has not been contacted by Palliative Care  Discussed Mr. Bluett impression of patient's Medicaid status - Mr. Hope states he would like [name] to contact him next week to review the response he received -internal collaboration with [employer] re: update for long term care plan and need for Medicaid  f/u w/Mr. Bosshart  Discussed plans for ongoing contact and scheduled a follow up call with Mr. Wann for next week  Placed outbound call to [place], spoke with Marzetta Board, she will send a high priority message to the intake nurse and request Mr. Rotter be contacted re: the Palliative Care referral asap   Inbound call received from PTA [facility] from [facility];        she reports having to call 911 today to have the patient transported to [facility] for evaluation           and treatment of left lower hip and leg pain; when she arrived the patient was home alone and she was unable        to bend or move her left leg and or hip due to have excruating pain - Claiborne Billings suspects a possible fracture from         the fall patient had at home yesterday  Internal collaboration with [name] re: patient's status and the complaint that was filed to APS by [place] Worker    CCM SW Interventions:  Completed 06/12/18  Outbound call to the patient spouse to follow up on progress of patient goal  Determined the patient has been discharged home from recent inpatient stay due to the families inability to cover SNF co-payments. The patient is also no longer covered under LTC Medicaid for SNF placement  Assessed for patient caregiver concerns/stressors related to patient care - Mr Nevins reports plans to retire within the next 30 days to provide care to the patient. In the meantime Mr Ghazarian will rely on family members and home health to assist with patient care  Educated Mr Pitt on home health restrictions including limitations of number of visits for home health aide  Obtained verbal permission from Mr Rawlinson to follow up with resources to assist with patient care  Outbound call to [facility] to determine patient [place] date would be 06/13/18 with no ability to be seen sooner due to scheduling  Outbound call to [place] to update on patients transition home-  determined [name] will conduct a joint visit with home health RN on 06/13/18  Outbound call to Palliative Care RN navigator [name] to update of the patients current disposition - [name] will follow up with the patients spouse regarding intake appointment  Collaboration with patients RN Case Manager Glenard Haring Little to provide updates of above interventions  Collaboration with patients primary provider regarding goal progression  Patient Self Care Activities:  . Currently UNABLE TO independently participate in [place]  Please see past updates related to this goal by clicking on the "Past Updates" button in the selected goal           Follow Up Plan: SW will follow up with patient by phone over the next 3 days   [name], BSW, CDP Social Worker, Certified Dementia Practitioner [place] / [facility] [facility] [phone]  Total time spent performing care coordination and/or care management activities with the patient by phone or face to face = 85 minutes.

## 2018-06-12 NOTE — Patient Instructions (Signed)
Social Worker Visit Information  Goals we discussed today:  Goals Addressed            This Visit's Progress     Patient Stated   . "I need a ramp" (pt-stated)       Current Barriers:  . Financial constraints . Lacks knowledge of community resource: to assist with ramp installation  Clinical Social Work Clinical Goal(s):  [name] Over the next 30 days, client will work with SW to address concerns related to the ability to exit her home safely - . Over the next 60 days, the patient will follow up with community agencies, as directed by SW  CCM SW Interventions: Completed 06/12/18  Outbound call to the patients husband regarding patient stated goal  Reviewed progress of referral source  Collaboration e-mail correspondence sent to Michel Harrow, [place] Living Coordinator regarding progress of home modification referral   Patient Self Care Activities:  . Currently UNABLE TO independently exit her home due to concerns with mobility. The patient relies on her spouse and grand-children for assistance   Please see past updates related to this goal by clicking on the "Past Updates" button in the selected goal      . "I need help with transportation" (pt-stated)   On track    Current Barriers:  . ADL IADL limitations - the patient reports difficulty leaving her home due to mobility . Limited access to caregiver - the patient reports her spouse provides transportation to all medical appointments. Unfortunately, he is only available to provide transportation on Monday's  Clinical Social Work Clinical Goal(s):  [name] Over the next 45 days, client will work with SW to address concerns related to lack of transportation resources Not met- re-established 06/12/18  CCM SW Interventions: Completed 06/12/18  Outbound call to the patients spouse Reverie Vaquera to discuss patient stated goal  Determined the patient had an [facility] appointment in the next two weeks and would need  wheelchair transport due to recent hip fracture  Educated Mr Robards on the limitations due the patinets inability to safely exit the home  Determined Mr Costantino would arrange to have someone assist with helping the patient to safely exit the home.   Advised Mr Lallier CCM SW would follow up with [facility] to determine patients coverage of transportation benefit  Outbound call to [employer] to determine the patient does not have Public relations account executive with community resource [facility] to determine the patient is still an active recipient and eligible to utilize the [employer] with CCM RN Case Manager regarding concerns of patients husband assisting the patient out of the home based on recent inpatient surgery  Collaboration with patients primary care physician regarding patient stated goal Patient Self Care Activities:  . Currently UNABLE TO independently leave her home to access transportation resources    Please see past updates related to this goal by clicking on the "Past Updates" button in the selected goal        Other   . "I need help caring for my wife"       Spouse states  Current Barriers:  [name] Knowledge Deficits related to potential complications secondary to immobility  . Lacks caregiver support  . Chronic Disease Management support and education needs related to how to provide total care, obtain DME and utilize the appropriate resources available  Nurse Case Manager Clinical Goal(s):  [name] Over the next 30 days, patient/spouse will verbalize understanding of plan  for obtaining DME.  . Over the next 60 days, patient/spouse will verbalize understanding of Palliative Care Services.  . Over the next 90 days, patient will work with the CCM team to improve knowledge and understanding about potential complications associated with patient's impaired physical mobility including complications related to skin breakdown, and pneumonia,  malnutrition and DVT.   CCM RN Interventions:  Completed 06/06/18  Completed call with spouse Blaine Hamper  Assessed for long term care plan for patient - Mr. Knipfer states he plans to retire at the end of the month and will care for Mrs. Colligan full time-he plans to continue to rely on family (grandson and daughter) until then- he states his grandson lives with him and is at the home with Mrs. Liller daily   Confirmed grandson will be present until someone relieves him, confirms grandson will be able to provide patient meals, assist her to the restroom and ensure she is dry and clean and has all health care needs met including turning and shifting weight and provided fluids throughout the day in order to stay well hydrated  Discussed status of DME - Mr. Kushner states he has communicated with [facility] and the DME will be delivered to the home on Monday, 06/10/18  Discussed status of Palliative Care referral - Mr. Avera has not been contacted by Palliative Care  Discussed Mr. Breidenbach impression of patient's Medicaid status - Mr. Latendresse states he would like [name] to contact him next week to review the response he received -internal collaboration with [employer] re: update for long term care plan and need for Medicaid f/u w/Mr. Mckibben  Discussed plans for ongoing contact and scheduled a follow up call with Mr. Schmuhl for next week  Placed outbound call to [place], spoke with Marzetta Board, she will send a high priority message to the intake nurse and request Mr. Denman be contacted re: the Palliative Care referral asap   Inbound call received from PTA [facility] from [facility];        she reports having to call 911 today to have the patient transported to [facility] for evaluation           and treatment of left lower hip and leg pain; when she arrived the patient was home alone and she was unable        to bend or move her left leg and or hip due  to have excruating pain - Claiborne Billings suspects a possible fracture from         the fall patient had at home yesterday  Internal collaboration with [name] re: patient's status and the complaint that was filed to APS by [place] Worker    CCM SW Interventions:  Completed 06/12/18  Outbound call to the patient spouse to follow up on progress of patient goal  Determined the patient has been discharged home from recent inpatient stay due to the families inability to cover SNF co-payments. The patient is also no longer covered under LTC Medicaid for SNF placement  Assessed for patient caregiver concerns/stressors related to patient care - Mr Southgate reports plans to retire within the next 30 days to provide care to the patient. In the meantime Mr Everhart will rely on family members and home health to assist with patient care  Educated Mr Chesney on home health restrictions including limitations of number of visits for home health aide  Obtained verbal permission from Mr Hedges to follow  up with resources to assist with patient care  Outbound call to [facility] to determine patient [place] date would be 06/13/18 with no ability to be seen sooner due to scheduling  Outbound call to [place] to update on patients transition home- determined [name] will conduct a joint visit with home health RN on 06/13/18  Outbound call to Palliative Care RN navigator [name] to update of the patients current disposition - [name] will follow up with the patients spouse regarding intake appointment  Collaboration with patients RN Case Manager Glenard Haring [place] to provide updates of above interventions  Collaboration with patients primary provider regarding goal progression  Patient Self Care Activities:  . Currently UNABLE TO independently participate in [place]  Please see past updates related to this goal by clicking on the "Past Updates" button in the selected goal            Materials Provided: Verbal education about community resources provided by phone  Follow Up Plan: SW will follow up with patient by phone over the next 3 days   [name], BSW, CDP Social Worker, Certified Dementia Practitioner [place] [facility] [place] [facility] [phone]

## 2018-06-13 ENCOUNTER — Telehealth: Payer: Self-pay

## 2018-06-13 ENCOUNTER — Ambulatory Visit: Payer: Self-pay

## 2018-06-13 DIAGNOSIS — M059 Rheumatoid arthritis with rheumatoid factor, unspecified: Secondary | ICD-10-CM | POA: Diagnosis not present

## 2018-06-13 DIAGNOSIS — I5032 Chronic diastolic (congestive) heart failure: Secondary | ICD-10-CM | POA: Diagnosis not present

## 2018-06-13 DIAGNOSIS — D631 Anemia in chronic kidney disease: Secondary | ICD-10-CM | POA: Diagnosis not present

## 2018-06-13 DIAGNOSIS — I130 Hypertensive heart and chronic kidney disease with heart failure and stage 1 through stage 4 chronic kidney disease, or unspecified chronic kidney disease: Secondary | ICD-10-CM

## 2018-06-13 DIAGNOSIS — I2510 Atherosclerotic heart disease of native coronary artery without angina pectoris: Secondary | ICD-10-CM | POA: Diagnosis not present

## 2018-06-13 DIAGNOSIS — N183 Chronic kidney disease, stage 3 (moderate): Secondary | ICD-10-CM | POA: Diagnosis not present

## 2018-06-13 DIAGNOSIS — M47894 Other spondylosis, thoracic region: Secondary | ICD-10-CM | POA: Diagnosis not present

## 2018-06-13 DIAGNOSIS — I25119 Atherosclerotic heart disease of native coronary artery with unspecified angina pectoris: Secondary | ICD-10-CM | POA: Diagnosis not present

## 2018-06-13 DIAGNOSIS — I5031 Acute diastolic (congestive) heart failure: Secondary | ICD-10-CM | POA: Diagnosis not present

## 2018-06-13 DIAGNOSIS — G9341 Metabolic encephalopathy: Secondary | ICD-10-CM | POA: Diagnosis not present

## 2018-06-13 DIAGNOSIS — R5381 Other malaise: Secondary | ICD-10-CM

## 2018-06-13 DIAGNOSIS — J9601 Acute respiratory failure with hypoxia: Secondary | ICD-10-CM | POA: Diagnosis not present

## 2018-06-13 DIAGNOSIS — M069 Rheumatoid arthritis, unspecified: Secondary | ICD-10-CM | POA: Diagnosis not present

## 2018-06-13 NOTE — Chronic Care Management (AMB) (Signed)
  Chronic Care Management   Outreach Note  06/13/2018 Name: [name] MRN: [id number] DOB: February 15, 1940  Referred by: Glendale Chard, MD Reason for referral : Care Coordination   I placed an unsuccessful outreach to the patients grand-daughter to assist with progression of patient stated goals and care coordination.  Follow Up Plan: A HIPPA compliant phone message was left for the patient providing contact information and requesting a return call.  The CM team will reach out to the patient again over the next 3 days.   [name], BSW, CDP Social Worker, Certified Dementia Practitioner [place] / [place] [facility] [phone]  Total time spent performing care coordination and/or care management activities with the patient by phone or face to face = 3 minutes.

## 2018-06-13 NOTE — Chronic Care Management (AMB) (Signed)
  Chronic Care Management   Outreach Note  06/13/2018 Name: [name] MRN: [id number] DOB: 1940-08-05  Referred by: Glendale Chard, MD Reason for referral : Chronic Care Management (CCM RN CM Telephone Follow up )   An unsuccessful telephone outreach was attempted today. The patient was referred to the case management team by Glendale Chard MD for assistance with chronic care management and care coordination. I left a HIPAA compliant voice message for patient's granddaughter [name], advised patient should not take Aleve for pain due to being on a blood thinner. Provided my contact number and requested a return phone call.    Follow Up Plan: Telephone follow up appointment with CCM team member scheduled for: 06/19/18  [name], [facility] Care Management Coordinator [place] [facility]  Direct Phone: [phone]

## 2018-06-13 NOTE — Telephone Encounter (Signed)
This nurse attempted to call patient in order to do a transition of care call. A voicemail was left for patient to return call to office.

## 2018-06-14 ENCOUNTER — Telehealth: Payer: Self-pay

## 2018-06-14 ENCOUNTER — Other Ambulatory Visit: Payer: Self-pay

## 2018-06-14 ENCOUNTER — Ambulatory Visit: Payer: Self-pay

## 2018-06-14 DIAGNOSIS — R5381 Other malaise: Secondary | ICD-10-CM

## 2018-06-14 DIAGNOSIS — M059 Rheumatoid arthritis with rheumatoid factor, unspecified: Secondary | ICD-10-CM | POA: Diagnosis not present

## 2018-06-14 DIAGNOSIS — I5032 Chronic diastolic (congestive) heart failure: Secondary | ICD-10-CM

## 2018-06-14 DIAGNOSIS — G9341 Metabolic encephalopathy: Secondary | ICD-10-CM | POA: Diagnosis not present

## 2018-06-14 DIAGNOSIS — I130 Hypertensive heart and chronic kidney disease with heart failure and stage 1 through stage 4 chronic kidney disease, or unspecified chronic kidney disease: Secondary | ICD-10-CM | POA: Diagnosis not present

## 2018-06-14 DIAGNOSIS — I25119 Atherosclerotic heart disease of native coronary artery with unspecified angina pectoris: Secondary | ICD-10-CM | POA: Diagnosis not present

## 2018-06-14 DIAGNOSIS — I2510 Atherosclerotic heart disease of native coronary artery without angina pectoris: Secondary | ICD-10-CM | POA: Diagnosis not present

## 2018-06-14 DIAGNOSIS — M069 Rheumatoid arthritis, unspecified: Secondary | ICD-10-CM | POA: Diagnosis not present

## 2018-06-14 DIAGNOSIS — M47894 Other spondylosis, thoracic region: Secondary | ICD-10-CM | POA: Diagnosis not present

## 2018-06-14 DIAGNOSIS — N183 Chronic kidney disease, stage 3 (moderate): Secondary | ICD-10-CM | POA: Diagnosis not present

## 2018-06-14 DIAGNOSIS — I5031 Acute diastolic (congestive) heart failure: Secondary | ICD-10-CM | POA: Diagnosis not present

## 2018-06-14 DIAGNOSIS — J9601 Acute respiratory failure with hypoxia: Secondary | ICD-10-CM | POA: Diagnosis not present

## 2018-06-14 DIAGNOSIS — D631 Anemia in chronic kidney disease: Secondary | ICD-10-CM | POA: Diagnosis not present

## 2018-06-14 NOTE — Chronic Care Management (AMB) (Signed)
  Chronic Care Management   Follow Up Note   06/14/2018 Name: [name] MRN: [id number] DOB: 10/07/1940  Referred by: No primary care provider on file. Reason for referral : Chronic Care Management (CCM RN CM Telephone Follow Up w/[facility] )   [name] is a 78 y.o. year old female who is a primary care patient of No primary care provider on file.. The CCM team was consulted for assistance with chronic disease management and care coordination needs.    Review of patient status, including review of consultants reports, relevant laboratory and other test results, and collaboration with appropriate care team members and the patient's provider was performed as part of comprehensive patient evaluation and provision of chronic care management services.    Placed outbound call to nurse [name] from [place] today.   Goals Addressed    . Assist patient/spouse with disease management and care coordination needs.        Current Barriers:   Ineffective Self Health Maintenance  Knowledge Deficits related to short term plan for care coordination needs and long term plans for chronic disease management needs  Nurse Case Manager Clinical Goal(s):   Over the next 90 days, patient will work with care management team to address care coordination and chronic disease management needs related to Disease Management; Care Coordination; Medication Management and Education; Psychosocial Support; Caregiver Stress support; and Level of Care Concerns.     CCM RN CM Interventions:  Completed call with nurse [name] from [place] on 06/14/18    Placed outbound call to nurse [name] [phone]; introduced myself and stated the purpose of my call to get a patient status update following her home visit from yesterday; [name] states she is with a client and will call me back afterwards   Patient Self Care Activities:   Currently UNABLE TO independently self manage needs related to  chronic health conditions.   Please see past updates related to this goal by clicking on the "Past Updates" button in the selected goal         Telephone follow up appointment with care management team member scheduled for: 06/17/18   [name], [facility] Care Management Coordinator [place] [facility]  Direct Phone: [phone]

## 2018-06-14 NOTE — Telephone Encounter (Signed)
Phone call placed to patient to offer to schedule a visit with Palliative Care. VM left 

## 2018-06-14 NOTE — Chronic Care Management (AMB) (Addendum)
Chronic Care Management   Follow Up Note   06/12/2018 Name: [name] MRN: [id number] DOB: [birth year]  Referred by: Becky Chard, MD Reason for referral : Chronic Care Management (CCM RN CM Telephone Follow Up )   [name] is a 78 y.o. year old female who is a primary care patient of Becky Chard, MD. The CCM team was consulted for assistance with chronic disease management and care coordination needs.    Review of patient status, including review of consultants reports, relevant laboratory and other test results, and collaboration with appropriate care team members and the patient's provider was performed as part of comprehensive patient evaluation and provision of chronic care management services.    I spoke with [name] and his Becky [name] by telephone today to f/u on patient's post discharge status long term plan of care.   Goals Addressed    . "I need help caring for my wife"       Spouse states  Current Barriers:  [name] Knowledge Deficits related to potential complications secondary to immobility  . Lacks caregiver support  . Chronic Disease Management support and education needs related to how to provide total care, obtain DME and utilize the appropriate resources available  Nurse Case Manager Clinical Goal(s):  [name] Over the next 30 days, patient/spouse will verbalize understanding of plan for obtaining DME.  . Over the next 60 days, patient/spouse will verbalize understanding of Palliative Care Services.  . Over the next 90 days, patient will work with the CCM team to improve knowledge and understanding about potential complications associated with patient's impaired physical mobility including complications related to skin breakdown, and pneumonia, malnutrition and DVT.   CCM RN Interventions:  Completed call with husband [name] and Becky [name] on 06/12/18   Assessed for long term care plan for patient - [name] has moved his retirement date  out to the end of the next week. [name] will provide care for [name] during the daytime while he is working. Confirmed patient's grandson will also be available to assist if needed.  Reviewed post discharge instructions with [name] and Becky  Discussed status of DME - [name]  Discussed status of Palliative Care referral - [name] has not been contacted by [facility]  Internal collaboration with [name] re: update for long term care plan. [name] has reached out to Palliative Care, [name] advised to expect a call from [place] to schedule a home visit.  Discussed status of Home Health Services: Patient will resume services with [name]- informed [name] that a nurse visit is scheduled for tomorrow; discussed a PT visit should take place on Friday per [facility]  Pain: completed medication reconciliation - Patient is not c/o pain, Becky denies patient having facial grimacing with movement: pt does not have Hydrocodone on hand, she is not taking pain meds at this time-Becky requested checking with [name] about patient taking her preferred pain med, Aleve (left vm for Becky on cell phone, [name] said no to Aleve due to patient taking blood thinner)  Skin: Verbal education given on the importance of turning patient, weight shifting and supporting with pillows to relieve pressure from bony prominences to help avoid skin breakdown-instructed to turn every 2 hrs  Verbal education given related to s/s of early skin breakdown and instructed to inspect patient's skin daily  Instructed to complete a visual inspection of  patient's skin daily and to report noted changes promptly  Surgical Incision: Instructed Becky to visually inspect patient's surgical incision; dressing is reported to be clean, dry and intact; family was not instructed on wound  care; instructed family to allow home health nurse to care for wound at tomorrow's visit and further instruct  Foley Catheter: Assessed for knowledge and understanding of Foley Catheter Care; 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; reviewed s/s of UTI and when to reports symptoms if they occur  Respiratory: Verbal education given about the importance of having patient cough, turn and deep breath, use of incentive spirometer 10 times per hour to help avoid respiratory infection while bedbound  MD follow up: pt is scheduled to f/u with Urology on June 8th; post surgical f/u with Ortho has not yet been scheduled  W/C Ramp: per [facility], she will f/u on the status of having a W/C built and or rented for short-term; process has been initiated by [employer]: patient was receiving meals on Friday's; meals were not delivered last Friday but patient was IP; [employer] will follow up on next expected delivery of meals; Becky is able to heat meals up and set up meals, patient is able to self-feed  Hydration: reinforced importance of keeping patient well hydrated, discussed patient can have jello, popsicles; provided rationale for keeping patient well hydrated   Discussed plans with patient for ongoing care management follow up and provided patient with direct contact information for care management team  06/06/18: Internal collaboration with BSW [name] re: patient's status and the complaint that was filed to APS by [place] Worker    CCM SW Interventions:  Completed 06/12/18  Outbound call to the patient spouse to follow up on progress of patient goal  Determined the patient has been discharged home from recent inpatient stay due to the families inability to cover SNF co-payments. The patient is also no longer covered under LTC Medicaid for SNF placement  Assessed for patient caregiver concerns/stressors related to patient  care - [name] reports plans to retire within the next 30 days to provide care to the patient. In the meantime [name] will rely on family members and home health to assist with patient care  Educated [name] on home health restrictions including limitations of number of visits for home health aide  Obtained verbal permission from Mr Garretson to follow up with resources to assist with patient care  Outbound call to [facility] to determine patient [place] date would be 06/13/18 with no ability to be seen sooner due to scheduling  Outbound call to [place] to update on patients transition home- determined [name] will conduct a joint visit with home health RN on 06/13/18  Outbound call to Palliative Care RN navigator [name] to update of the patients current disposition - [name] will follow up with the patients spouse regarding intake appointment  Collaboration with patients RN Case Manager Glenard Haring Loriene [place] to provide updates of above interventions  Collaboration with patients primary provider regarding goal progression  Patient Self Care Activities:  . Currently UNABLE TO independently participate in [place]  Please see past updates related to this goal by clicking on the "Past Updates" button in the selected goal         Follow up with [place]; Follow up with Mr. Font and Becky Raelene for a patient status update.    Nahom Carfagno,  RN,CCM Care Management Coordinator [place] Management/[facility]  Direct Phone: [phone]

## 2018-06-14 NOTE — Patient Instructions (Addendum)
Visit Information  Goals Addressed    . "I need help caring for my wife"       Spouse states  Current Barriers:  [name] Knowledge Deficits related to potential complications secondary to immobility  . Lacks caregiver support  . Chronic Disease Management support and education needs related to how to provide total care, obtain DME and utilize the appropriate resources available  Nurse Case Manager Clinical Goal(s):  [name] Over the next 30 days, patient/spouse will verbalize understanding of plan for obtaining DME.  . Over the next 60 days, patient/spouse will verbalize understanding of Palliative Care Services.  . Over the next 90 days, patient will work with the CCM team to improve knowledge and understanding about potential complications associated with patient's impaired physical mobility including complications related to skin breakdown, and pneumonia, malnutrition and DVT.   CCM RN Interventions:  Completed call with husband [name] and granddaughter [name] on 06/12/18   Assessed for long term care plan for patient - [name] has moved his retirement date out to the end of the next week. His granddaughter [name] will provide care for [name] during the daytime while he is working. Confirmed patient's grandson will also be available to assist if needed.  Reviewed post discharge instructions with [name] and granddaughter  Discussed status of DME - Patient has received her Adams bed with trapeze and transfer chair  Discussed status of Palliative Care referral - [name] has not been contacted by [name]  Internal collaboration with [place] re: update for long term care plan. [name] has reached out to Palliative Care, [name] advised to expect a call from [name] to schedule a home visit.  Discussed status of Home Health Services: Patient will resume services with [name]- informed [name] that a nurse visit is scheduled for tomorrow; discussed a PT  visit should take place on Friday per [name]  Pain: completed medication reconciliation - Patient is not c/o pain, granddaughter denies patient having facial grimacing with movement: pt does not have Hydrocodone on hand, she is not taking pain meds at this time-granddaughter requested checking with [name] about patient taking her preferred pain med, Aleve (left vm for granddaughter on cell phone, [name] said no to Aleve due to patient taking blood thinner)  Skin: Verbal education given on the importance of turning patient, weight shifting and supporting with pillows to relieve pressure from bony prominences to help avoid skin breakdown-instructed to turn every 2 hrs  Verbal education given related to s/s of early skin breakdown and instructed to inspect patient's skin daily  Instructed to complete a visual inspection of patient's skin daily and to report noted changes promptly  Surgical Incision: Instructed granddaughter to visually inspect patient's surgical incision; dressing is reported to be clean, dry and intact; family was not instructed on wound care; instructed family to allow home health nurse to care for wound at tomorrow's visit and further instruct  Foley Catheter: Assessed for knowledge and understanding of Foley Catheter Care; granddaughter is a CNA and is familiar with performing Foley care; reviewed s/s of UTI and when to reports symptoms if they occur  Respiratory: Verbal education given about the importance of having patient cough, turn and deep breath, use of incentive spirometer 10 times per hour to help avoid respiratory infection while bedbound  MD follow up: pt is scheduled to f/u with Urology on June 8th; post surgical f/u with Ortho has not yet been scheduled  W/C Ramp:  per [name], she will f/u on the status of having a W/C built and or rented for short-term; process has been initiated by [name] Meals: patient was receiving meals on  Friday's; meals were not delivered last Friday but patient was IP; BSW [name] will follow up on next expected delivery of meals; granddaughter is able to heat meals up and set up meals, patient is able to self-feed  Hydration: reinforced importance of keeping patient well hydrated, discussed patient can have jello, popsicles; provided rationale for keeping patient well hydrated   Discussed plans with patient for ongoing care management follow up and provided patient with direct contact information for care management team  06/06/18: Internal collaboration with BSW [name] re: patient's status and the complaint that was filed to APS by [name]    CCM SW Interventions:  Completed 06/12/18  Outbound call to the patient spouse to follow up on progress of patient goal  Determined the patient has been discharged home from recent inpatient stay due to the families inability to cover SNF co-payments. The patient is also no longer covered under LTC Medicaid for SNF placement  Assessed for patient caregiver concerns/stressors related to patient care - [name] reports plans to retire within the next 30 days to provide care to the patient. In the meantime [name] will rely on family members and home health to assist with patient care  Educated [name] on home health restrictions including limitations of number of visits for home health aide  Obtained verbal permission from [name] to follow up with resources to assist with patient care  Outbound call to [facility] to determine patient [name] date would be 06/13/18 with no ability to be seen sooner due to scheduling  Outbound call to [name] to update on patients transition home- determined [name] will conduct a joint visit with home health RN on 06/13/18  Outbound call to Palliative Care RN navigator [name] to update of the patients current disposition - [name] will follow up with the patients spouse  regarding intake appointment  Collaboration with patients RN Case Manager [name] to provide updates of above interventions  Collaboration with patients primary provider regarding goal progression  Patient Self Care Activities:  . Currently UNABLE TO independently participate in [place]  Please see past updates related to this goal by clicking on the "Past Updates" button in the selected goal          The patient verbalized understanding of instructions provided today and declined a print copy of patient instruction materials.    Follow up with [place]; Follow up with Becky. Mumby and granddaughter Zoiee for a patient status update.   [name], RN,CCM Care Management Coordinator [place] [facility]  Direct Phone: [phone]

## 2018-06-14 NOTE — Chronic Care Management (AMB) (Signed)
Erroneous Encounter

## 2018-06-17 ENCOUNTER — Ambulatory Visit: Payer: Self-pay

## 2018-06-17 ENCOUNTER — Telehealth: Payer: Self-pay

## 2018-06-17 DIAGNOSIS — M069 Rheumatoid arthritis, unspecified: Secondary | ICD-10-CM | POA: Diagnosis not present

## 2018-06-17 DIAGNOSIS — I130 Hypertensive heart and chronic kidney disease with heart failure and stage 1 through stage 4 chronic kidney disease, or unspecified chronic kidney disease: Secondary | ICD-10-CM | POA: Diagnosis not present

## 2018-06-17 DIAGNOSIS — G9341 Metabolic encephalopathy: Secondary | ICD-10-CM | POA: Diagnosis not present

## 2018-06-17 DIAGNOSIS — R5381 Other malaise: Secondary | ICD-10-CM

## 2018-06-17 DIAGNOSIS — I2510 Atherosclerotic heart disease of native coronary artery without angina pectoris: Secondary | ICD-10-CM | POA: Diagnosis not present

## 2018-06-17 DIAGNOSIS — M47894 Other spondylosis, thoracic region: Secondary | ICD-10-CM | POA: Diagnosis not present

## 2018-06-17 DIAGNOSIS — M059 Rheumatoid arthritis with rheumatoid factor, unspecified: Secondary | ICD-10-CM

## 2018-06-17 DIAGNOSIS — I5031 Acute diastolic (congestive) heart failure: Secondary | ICD-10-CM | POA: Diagnosis not present

## 2018-06-17 DIAGNOSIS — I5032 Chronic diastolic (congestive) heart failure: Secondary | ICD-10-CM

## 2018-06-17 DIAGNOSIS — N183 Chronic kidney disease, stage 3 (moderate): Secondary | ICD-10-CM | POA: Diagnosis not present

## 2018-06-17 DIAGNOSIS — J9601 Acute respiratory failure with hypoxia: Secondary | ICD-10-CM | POA: Diagnosis not present

## 2018-06-17 DIAGNOSIS — D631 Anemia in chronic kidney disease: Secondary | ICD-10-CM | POA: Diagnosis not present

## 2018-06-17 NOTE — Chronic Care Management (AMB) (Signed)
  Chronic Care Management   Outreach Note  06/17/2018 Name: [name] MRN: [id number] DOB: 1940/04/19  Referred by: No primary care provider on file. Reason for referral : Chronic Care Management (CCM RNCM Telephone Follow Up)   An unsuccessful telephone outreach was attempted today. The patient was referred to the case management team by Glendale Chard MD for assistance with chronic care management and care coordination.   Follow Up Plan: The care management team will reach out to the patient again over the next 3-5 days.   [name], RN,CCM Care Management Coordinator [place] [facility]  Direct Phone: [phone]

## 2018-06-18 ENCOUNTER — Other Ambulatory Visit: Payer: Self-pay | Admitting: Internal Medicine

## 2018-06-18 ENCOUNTER — Telehealth: Payer: Self-pay

## 2018-06-18 ENCOUNTER — Ambulatory Visit: Payer: Self-pay

## 2018-06-18 DIAGNOSIS — G9341 Metabolic encephalopathy: Secondary | ICD-10-CM | POA: Diagnosis not present

## 2018-06-18 DIAGNOSIS — N183 Chronic kidney disease, stage 3 (moderate): Secondary | ICD-10-CM | POA: Diagnosis not present

## 2018-06-18 DIAGNOSIS — I130 Hypertensive heart and chronic kidney disease with heart failure and stage 1 through stage 4 chronic kidney disease, or unspecified chronic kidney disease: Secondary | ICD-10-CM | POA: Diagnosis not present

## 2018-06-18 DIAGNOSIS — M47894 Other spondylosis, thoracic region: Secondary | ICD-10-CM | POA: Diagnosis not present

## 2018-06-18 DIAGNOSIS — M069 Rheumatoid arthritis, unspecified: Secondary | ICD-10-CM | POA: Diagnosis not present

## 2018-06-18 DIAGNOSIS — D631 Anemia in chronic kidney disease: Secondary | ICD-10-CM | POA: Diagnosis not present

## 2018-06-18 DIAGNOSIS — R5381 Other malaise: Secondary | ICD-10-CM

## 2018-06-18 DIAGNOSIS — I5031 Acute diastolic (congestive) heart failure: Secondary | ICD-10-CM | POA: Diagnosis not present

## 2018-06-18 DIAGNOSIS — J9601 Acute respiratory failure with hypoxia: Secondary | ICD-10-CM | POA: Diagnosis not present

## 2018-06-18 DIAGNOSIS — I2510 Atherosclerotic heart disease of native coronary artery without angina pectoris: Secondary | ICD-10-CM | POA: Diagnosis not present

## 2018-06-18 NOTE — Chronic Care Management (AMB) (Signed)
Chronic Care Management    Clinical Social Work Follow Up Note  06/18/2018 Name: [name] MRN: [id number] DOB: Oct 25, 1940  [name] is a 78 y.o. year old female who is a primary care patient of Becky Chard MD. The CCM team was consulted for assistance with Transportation Resources, [employer] and Level of Care Concerns.   Review of patient status, including review of consultants reports, other relevant assessments, and collaboration with appropriate care team members and the patient's provider was performed as part of comprehensive patient evaluation and provision of chronic care management services.      Goals Addressed            This Visit's Progress     Patient Stated   . "I need a ramp" (pt-stated)   On track    Current Barriers:  . Financial constraints . Lacks knowledge of community resource: to assist with ramp installation  Clinical Social Work Clinical Goal(s):  [name] Over the next 30 days, client will work with SW to address concerns related to the ability to exit her home safely - . Over the next 60 days, the patient will follow up with community agencies, as directed by SW  CCM SW Interventions: Completed 06/18/18  Collaboration call with [facility]) regarding patient inability to safely leave her home without a ramp. [name] reports ability to provide a temporary ramp for the patient while awaiting a permanent ramp. [name] reports she is hopeful this may be provided within the next two weeks.  Provided Becky. Adams with contact number to the patients spouse as well as her grand-daughter and caregiver.  CCM SW placed an unsuccessful outbound call to [name] to provide an update on goal progression  CCM SW placed a successful outbound call to the patients grand-daughter [name] to provide an update of goal progression  Encouraged the patients grand-daughter to return any calls promptly to [facility]  to obtain a temporary ramp for the patient   Patient Self Care Activities:  . Currently UNABLE TO independently exit her home due to concerns with mobility. The patient relies on her spouse and grand-children for assistance   Please see past updates related to this goal by clicking on the "Past Updates" button in the selected goal      . "I need help with transportation" (pt-stated)   On track    Current Barriers:  . ADL IADL limitations - the patient reports difficulty leaving her home due to mobility . Limited access to caregiver - the patient reports her spouse provides transportation to all medical appointments. Unfortunately, he is only available to provide transportation on Monday's  Clinical Social Work Clinical Goal(s):  [name] Over the next 45 days, client will work with SW to address concerns related to lack of transportation resources Not met- re-established 06/12/18  CCM SW Interventions: Completed 06/18/18  CCM SW placed an unsuccessful outbound call to the patients husband [name] to update on patients ability to utilize [employer] for transportation needs  Outbound call to the patients grand-daughter [name] to provide information that the patient is still active with [employer] transportation services and can utilize for upcomming appointments  Patient Self Care Activities:  . Currently UNABLE TO independently leave her home to access transportation resources  Please see past updates related to this goal by clicking on the "Past Updates" button in the selected goal         Follow Up Plan: CCM SW  will follow up with the patient and her family within the next week to assess progression in obtaining a temporary ramp for the patients home.   [name], BSW, CDP Social Worker, Certified Dementia Practitioner [place] [facility] [phone]  Total time spent performing care coordination and/or care management activities with the patient by phone or face to face = 17 minutes.

## 2018-06-18 NOTE — Telephone Encounter (Signed)
Clarification. Palliative Care visit scheduled for 06/25/2018- Tuesday

## 2018-06-18 NOTE — Telephone Encounter (Signed)
Phone call placed to patient's husband. Due to the current COVID-19 infection/crises, the patient and family prefer, and have given their verbal consent for, a provider visit via telemedicine. HIPPA policies of confidentially were discussed and patient/family expressed understanding.   Visit scheduled for Thursday 06/25/2018

## 2018-06-18 NOTE — Patient Instructions (Signed)
Social Worker Visit Information  Goals we discussed today:  Goals Addressed            This Visit's Progress     Patient Stated   . "I need a ramp" (pt-stated)   On track    Current Barriers:  . Financial constraints . Lacks knowledge of community resource: to assist with ramp installation  Clinical Social Work Clinical Goal(s):  [name] Over the next 30 days, client will work with SW to address concerns related to the ability to exit her home safely - . Over the next 60 days, the patient will follow up with community agencies, as directed by SW  CCM SW Interventions: Completed 06/18/18  Collaboration call with [facility]) regarding patient inability to safely leave her home without a ramp. [name] reports ability to provide a temporary ramp for the patient while awaiting a permanent ramp. [name] reports she is hopeful this may be provided within the next two weeks.  Provided Mrs. [name] with contact number to the patients spouse as well as her grand-daughter and caregiver.  CCM SW placed an unsuccessful outbound call to Mr Mccannon to provide an update on goal progression  CCM SW placed a successful outbound call to the patients grand-daughter Analisa to provide an update of goal progression  Encouraged the patients grand-daughter to return any calls promptly to [facility] to obtain a temporary ramp for the patient   Patient Self Care Activities:  . Currently UNABLE TO independently exit her home due to concerns with mobility. The patient relies on her spouse and grand-children for assistance   Please see past updates related to this goal by clicking on the "Past Updates" button in the selected goal      . "I need help with transportation" (pt-stated)   On track    Current Barriers:  . ADL IADL limitations - the patient reports difficulty leaving her home due to mobility . Limited access to caregiver - the patient reports her spouse  provides transportation to all medical appointments. Unfortunately, he is only available to provide transportation on Monday's  Clinical Social Work Clinical Goal(s):  [name] Over the next 45 days, client will work with SW to address concerns related to lack of transportation resources Not met- re-established 06/12/18  CCM SW Interventions: Completed 06/18/18  CCM SW placed an unsuccessful outbound call to the patients husband [name] to update on patients ability to utilize [employer] for transportation needs  Outbound call to the patients grand-daughter Manilla to provide information that the patient is still active with [employer] transportation services and can utilize for upcomming appointments  Patient Self Care Activities:  . Currently UNABLE TO independently leave her home to access transportation resources    Please see past updates related to this goal by clicking on the "Past Updates" button in the selected goal           Follow Up Plan: SW will follow up with patient by phone over the next week   [name], BSW, CDP Social Worker, Certified Dementia Practitioner [place] [facility] [place] [facility] [phone]

## 2018-06-19 ENCOUNTER — Ambulatory Visit (INDEPENDENT_AMBULATORY_CARE_PROVIDER_SITE_OTHER): Payer: Medicare PPO

## 2018-06-19 ENCOUNTER — Ambulatory Visit: Payer: Self-pay

## 2018-06-19 ENCOUNTER — Telehealth: Payer: Self-pay

## 2018-06-19 ENCOUNTER — Other Ambulatory Visit: Payer: Self-pay

## 2018-06-19 DIAGNOSIS — I5031 Acute diastolic (congestive) heart failure: Secondary | ICD-10-CM | POA: Diagnosis not present

## 2018-06-19 DIAGNOSIS — N183 Chronic kidney disease, stage 3 (moderate): Secondary | ICD-10-CM | POA: Diagnosis not present

## 2018-06-19 DIAGNOSIS — R5381 Other malaise: Secondary | ICD-10-CM

## 2018-06-19 DIAGNOSIS — I5032 Chronic diastolic (congestive) heart failure: Secondary | ICD-10-CM | POA: Diagnosis not present

## 2018-06-19 DIAGNOSIS — M059 Rheumatoid arthritis with rheumatoid factor, unspecified: Secondary | ICD-10-CM

## 2018-06-19 DIAGNOSIS — J9601 Acute respiratory failure with hypoxia: Secondary | ICD-10-CM | POA: Diagnosis not present

## 2018-06-19 DIAGNOSIS — D631 Anemia in chronic kidney disease: Secondary | ICD-10-CM | POA: Diagnosis not present

## 2018-06-19 DIAGNOSIS — I130 Hypertensive heart and chronic kidney disease with heart failure and stage 1 through stage 4 chronic kidney disease, or unspecified chronic kidney disease: Secondary | ICD-10-CM

## 2018-06-19 DIAGNOSIS — I2510 Atherosclerotic heart disease of native coronary artery without angina pectoris: Secondary | ICD-10-CM | POA: Diagnosis not present

## 2018-06-19 DIAGNOSIS — M47894 Other spondylosis, thoracic region: Secondary | ICD-10-CM | POA: Diagnosis not present

## 2018-06-19 DIAGNOSIS — G9341 Metabolic encephalopathy: Secondary | ICD-10-CM | POA: Diagnosis not present

## 2018-06-19 DIAGNOSIS — M069 Rheumatoid arthritis, unspecified: Secondary | ICD-10-CM | POA: Diagnosis not present

## 2018-06-19 NOTE — Patient Instructions (Signed)
Social Worker Visit Information  Goals we discussed today:  Goals Addressed            This Visit's Progress     Patient Stated   . "I need a ramp" (pt-stated)   On track    Current Barriers:  . Financial constraints . Lacks knowledge of community resource: to assist with ramp installation  Clinical Social Work Clinical Goal(s):  [name] Over the next 30 days, client will work with SW to address concerns related to the ability to exit her home safely - . Over the next 60 days, the patient will follow up with community agencies, as directed by SW  CCM SW Interventions: Completed 06/19/18  Inbound call received from the patients spouse Cathyrn Deas in response to CCM SW unsuccessful call on 06/18/18  Provided Mr Wayne with information surrounding a temporary ramp provided by [employer] [facility])  Advised Mr Fuqua to expect a call from [facility] with [facility] to complete an intake call and determine if the resource can service the patients needs  E-mail collaboration with [facility] updating her of the patients spouse's knowledge of temporary ramp as well as to request an update once [facility] makes contact with the patients family  Follow up call with the patients spouse/caregiver planned to assess outcome of referral to [facility] for a temporary ramp  Patient Self Care Activities:  . Currently UNABLE TO independently exit her home due to concerns with mobility. The patient relies on her spouse and grand-children for assistance   Please see past updates related to this goal by clicking on the "Past Updates" button in the selected goal      . "I need help with transportation" (pt-stated)   On track    Current Barriers:  . ADL IADL limitations - the patient reports difficulty leaving her home due to mobility . Limited access to caregiver - the patient reports her spouse provides transportation to all medical appointments. Unfortunately, he is only available to provide  transportation on Monday's  Clinical Social Work Clinical Goal(s):  [name] Over the next 45 days, client will work with SW to address concerns related to lack of transportation resources Not met- re-established 06/12/18  CCM SW Interventions: Completed 06/19/18  Inbound call from patients spouse Addilee Neu in response to unsuccessful outreach attempt by CCM SW on 06/18/18  Informed Mr. Bauza of the patients active status with [employer] transportation services  Educated Mr. Mikus on [employer] services including cost and how to schedule services.   Reviewed the need for the patient to have someone ride with her on the [employer] vehicle or to meet her at appointments due to [place] inability to remain with the patient at an appointment  Provided Mr Riccardi with the contact number to the [employer] reservation line  Advised Mr Garno that when arranging transportation to clearly state the patient would need wheelchair accessible transportation  Encouraged Mr Slatten to contact [employer] reservation line by Friday to arrange transportation for [place] physician appointments  Patient Self Care Activities:  . Currently UNABLE TO independently leave her home to access transportation resources    Please see past updates related to this goal by clicking on the "Past Updates" button in the selected goal          Materials Provided: Verbal education about community resources provided by phone  Follow Up Plan: SW will follow up with patient by phone over the next 5 days   [name], [name],  CDP Social Worker, Transport planner [place] [facility] [place] [facility] [phone]

## 2018-06-19 NOTE — Telephone Encounter (Signed)
Left voicemail for family/ [place] to call back about pt not having bowel movements dr sanders recommends that she tries ducolax suppos

## 2018-06-19 NOTE — Patient Instructions (Signed)
Visit Information  Goals Addressed    . Assist patient/spouse with disease management and care coordination needs.        Current Barriers:   Ineffective Self Health Maintenance  Knowledge Deficits related to short term plan for care coordination needs and long term plans for chronic disease management needs  Nurse Case Manager Clinical Goal(s):   Over the next 90 days, patient will work with care management team to address care coordination and chronic disease management needs related to Disease Management; Care Coordination; Medication Management and Education; Psychosocial Support; Caregiver Stress support; and Level of Care Concerns.     CCM RN CM Interventions:  Completed call with nurse Gertie Fey from [place] on 06/19/18    Placed outbound call to nurse Gertie Fey [phone]; introduced myself and stated the purpose of my call to get a patient status update following her home visit from today; Fretta reports the patient's daughter was in the next room during her nurse visit, granddaughter and spouse were not in the home during her visit; she was able to adequately assess the patient's urine output due to the family is not recording output in log book  Discussed Gertie Fey has created a log sheet so the family can document the patient's output with each FC bag emptied; she has instructed the family accordingly  Discussed the patient has not had a BM since being home from the hospital despite adding Miralax  Discussed nurse Gertie Fey contacted Dr. Baird Cancer office to notify her and ask for recommendations  Discussed Gertie Fey has educated the family on importance of turning and deep breathing and to monitor skin closely for skin breakdown  Completed outbound call to granddaughter Zerline Melchior, 06/19/18    Reiterated importance of pushing fluids and recording urine output exactly as measured for [facility] RN to best assess output and renal function  Discussed importance of adhering to providing  Miralax as directed; discussed Dr. Baird Cancer may recommend an enema be given; granddaughter confirms patient has not had a bowel movement since being d/c home from the hospital   Completed outbound call to husband Janey Petron, 06/19/18   Reiterated importance of pushing fluids and recording urine output exactly as measured for [facility] RN to best assess output and renal function  Discussed importance of adhering to providing Miralax as directed; Mr. Politte confirms he emptied patient's urine this am and recorded the amount on the log sheet; Mr. Tineo states he received a call from Dr. Baird Cancer instructing him to pick up an enema kit from the pharmacy this afternoon; she further instructed Mr. Rothgeb to administer as directed  Confirmed Mr. Elsea last day at work is this Saturday  Answered questions about the [employer] transportation option for patient  Discussed plans with patient for ongoing care management follow up and provided patient with direct contact information for care management team   Inbound call received/completed with granddaughter Tiarna Koppen, 06/19/18   Discussed patient had a large BM today while working with OT  Discussed patient's urine output a short moment ago was 600 ml and was clear yellow  Discussed patient was able to sit up on the side of her bed for about 1 minute several times and was able to ambulate a short distance with the use of her walker and with OT  Praised [place] for pushing fluids and providing me with this update  Discussed plans with patient for ongoing care management follow up and provided patient with direct contact information for care management team  Patient Self  Care Activities:   Currently UNABLE TO independently self manage needs related to chronic health conditions.   Please see past updates related to this goal by clicking on the "Past Updates" button in the selected goal        The patient verbalized understanding of instructions  provided today and declined a print copy of patient instruction materials.   The care management team will reach out to the patient again over the next 7-10 days.   [name], RN,CCM Care Management Coordinator [place] [facility]  Direct Phone: [phone]

## 2018-06-19 NOTE — Chronic Care Management (AMB) (Signed)
Chronic Care Management    Clinical Social Work Follow Up Note  06/19/2018 Name: [name] MRN: [id number] DOB: Sep 18, 1940  [name] is a 78 y.o. year old female who is a primary care patient of No primary care provider on file.. The CCM team was consulted for assistance with Transportation Resources, [employer] and Level of Care Concerns.   Review of patient status, including review of consultants reports, other relevant assessments, and collaboration with appropriate care team members and the patient's provider was performed as part of comprehensive patient evaluation and provision of chronic care management services.     Goals Addressed            This Visit's Progress     Patient Stated   . "I need a ramp" (pt-stated)   On track    Current Barriers:  . Financial constraints . Lacks knowledge of community resource: to assist with ramp installation  Clinical Social Work Clinical Goal(s):  [name] Over the next 30 days, client will work with SW to address concerns related to the ability to exit her home safely - . Over the next 60 days, the patient will follow up with community agencies, as directed by SW  CCM SW Interventions: Completed 06/19/18  Inbound call received from the patients spouse Bindi Klomp in response to CCM SW unsuccessful call on 06/18/18  Provided Mr Chevalier with information surrounding a temporary ramp provided by [employer] [facility])  Advised Mr Gasser to expect a call from [facility] with [facility] to complete an intake call and determine if the resource can service the patients needs  E-mail collaboration with [facility] updating her of the patients spouse's knowledge of temporary ramp as well as to request an update once [facility] makes contact with the patients family  Follow up call with the patients spouse/caregiver planned to assess outcome of referral to [facility] for a temporary ramp  Patient Self Care Activities:  .  Currently UNABLE TO independently exit her home due to concerns with mobility. The patient relies on her spouse and grand-children for assistance   Please see past updates related to this goal by clicking on the "Past Updates" button in the selected goal      . "I need help with transportation" (pt-stated)   On track    Current Barriers:  . ADL IADL limitations - the patient reports difficulty leaving her home due to mobility . Limited access to caregiver - the patient reports her spouse provides transportation to all medical appointments. Unfortunately, he is only available to provide transportation on Monday's  Clinical Social Work Clinical Goal(s):  [name] Over the next 45 days, client will work with SW to address concerns related to lack of transportation resources Not met- re-established 06/12/18  CCM SW Interventions: Completed 06/19/18  Inbound call from patients spouse Arnelle Nale in response to unsuccessful outreach attempt by CCM SW on 06/18/18  Informed Mr. Sparger of the patients active status with [employer] transportation services  Educated Mr. Brazil on [employer] services including cost and how to schedule services.   Reviewed the need for the patient to have someone ride with her on the [employer] vehicle or to meet her at appointments due to [place] inability to remain with the patient at an appointment  Provided Mr Mogensen with the contact number to the [employer] reservation line  Advised Mr Cappelli that when arranging transportation to clearly state the patient would need wheelchair accessible transportation  Encouraged Mr  Leveille to contact [employer] reservation line by Friday to arrange transportation for upcomming June physician appointments  Patient Self Care Activities:  . Currently UNABLE TO independently leave her home to access transportation resources    Please see past updates related to this goal by clicking on the "Past Updates" button in the selected goal          Follow Up Plan: SW  will follow up with patient by phone over the next 5 days   [name], BSW, CDP Social Worker, Certified Dementia Practitioner [place] / [place] Management [phone]  Total time spent performing care coordination and/or care management activities with the patient by phone or face to face = 13 minutes.

## 2018-06-19 NOTE — Chronic Care Management (AMB) (Signed)
Chronic Care Management   Follow Up Note   06/19/2018 Name: [name] MRN: [id number] DOB: 1940-10-12  Referred by: No primary care provider on file. Reason for referral : Chronic Care Management (CCM RN CM Telephone Follow Up )   [name] is a 78 y.o. year old female who is a primary care patient of No primary care provider on file.. The CCM team was consulted for assistance with chronic disease management and care coordination needs.    Review of patient status, including review of consultants reports, relevant laboratory and other test results, and collaboration with appropriate care team members and the patient's provider was performed as part of comprehensive patient evaluation and provision of chronic care management services.   I spoke with Mr. Spiewak and granddaughter Alaa Mullally by telephone today for a CCM patient status update. I spoke with [facility] RN Gertie Fey from [place] for a status update following her home visit today.   Goals Addressed    . Assist patient/spouse with disease management and care coordination needs.        Current Barriers:   Ineffective Self Health Maintenance  Knowledge Deficits related to short term plan for care coordination needs and long term plans for chronic disease management needs  Nurse Case Manager Clinical Goal(s):   Over the next 90 days, patient will work with care management team to address care coordination and chronic disease management needs related to Disease Management; Care Coordination; Medication Management and Education; Psychosocial Support; Caregiver Stress support; and Level of Care Concerns.     CCM RN CM Interventions:  Completed call with nurse Gertie Fey from [place] on 06/19/18    Placed outbound call to nurse Gertie Fey [phone]; introduced myself and stated the purpose of my call to get a patient status update following her home visit from today; Fretta reports the patient's daughter was in the next  room during her nurse visit, granddaughter and spouse were not in the home during her visit; she was able to adequately assess the patient's urine output due to the family is not recording output in log book  Discussed Gertie Fey has created a log sheet so the family can document the patient's output with each FC bag emptied; she has instructed the family accordingly  Discussed the patient has not had a BM since being home from the hospital despite adding Miralax  Discussed nurse Gertie Fey contacted Dr. Baird Cancer office to notify her and ask for recommendations  Discussed Gertie Fey has educated the family on importance of turning and deep breathing and to monitor skin closely for skin breakdown  Completed outbound call to granddaughter Reganne Messerschmidt, 06/19/18    Reiterated importance of pushing fluids and recording urine output exactly as measured for [facility] RN to best assess output and renal function  Discussed importance of adhering to providing Miralax as directed; discussed Dr. Baird Cancer may recommend an enema be given; granddaughter confirms patient has not had a bowel movement since being d/c home from the hospital   Completed outbound call to husband Maudine Kluesner, 06/19/18   Reiterated importance of pushing fluids and recording urine output exactly as measured for [facility] RN to best assess output and renal function  Discussed importance of adhering to providing Miralax as directed; Mr. Lehrmann confirms he emptied patient's urine this am and recorded the amount on the log sheet; Mr. Benecke states he received a call from Dr. Baird Cancer instructing him to pick up an enema kit from the pharmacy this afternoon; she further  instructed Mr. Lindsley to administer as directed  Confirmed Mr. Kistner last day at work is this Saturday  Answered questions about the [employer] transportation option for patient  Discussed plans with patient for ongoing care management follow up and provided patient with direct contact information for  care management team   Inbound call received/completed with granddaughter Margaretta Chittum, 06/19/18   Discussed patient had a large BM today while working with OT  Discussed patient's urine output a short moment ago was 600 ml and was clear yellow  Discussed patient was able to sit up on the side of her bed for about 1 minute several times and was able to ambulate a short distance with the use of her walker and with OT  Praised Apolonio Schneiders for pushing fluids and providing me with this update  Discussed plans with patient for ongoing care management follow up and provided patient with direct contact information for care management team  Patient Self Care Activities:   Currently UNABLE TO independently self manage needs related to chronic health conditions.   Please see past updates related to this goal by clicking on the "Past Updates" button in the selected goal         The care management team will reach out to the patient again over the next 7-10 days.    [name], RN,CCM Care Management Coordinator [place] Management/[facility]  Direct Phone: [phone]

## 2018-06-20 ENCOUNTER — Telehealth: Payer: Self-pay

## 2018-06-20 ENCOUNTER — Ambulatory Visit: Payer: Self-pay

## 2018-06-20 ENCOUNTER — Other Ambulatory Visit: Payer: Self-pay | Admitting: Rheumatology

## 2018-06-20 ENCOUNTER — Other Ambulatory Visit: Payer: Self-pay | Admitting: Internal Medicine

## 2018-06-20 DIAGNOSIS — M059 Rheumatoid arthritis with rheumatoid factor, unspecified: Secondary | ICD-10-CM

## 2018-06-20 DIAGNOSIS — I2510 Atherosclerotic heart disease of native coronary artery without angina pectoris: Secondary | ICD-10-CM | POA: Diagnosis not present

## 2018-06-20 DIAGNOSIS — R5381 Other malaise: Secondary | ICD-10-CM

## 2018-06-20 DIAGNOSIS — N183 Chronic kidney disease, stage 3 (moderate): Secondary | ICD-10-CM | POA: Diagnosis not present

## 2018-06-20 DIAGNOSIS — I130 Hypertensive heart and chronic kidney disease with heart failure and stage 1 through stage 4 chronic kidney disease, or unspecified chronic kidney disease: Secondary | ICD-10-CM | POA: Diagnosis not present

## 2018-06-20 DIAGNOSIS — I5031 Acute diastolic (congestive) heart failure: Secondary | ICD-10-CM | POA: Diagnosis not present

## 2018-06-20 DIAGNOSIS — M47894 Other spondylosis, thoracic region: Secondary | ICD-10-CM | POA: Diagnosis not present

## 2018-06-20 DIAGNOSIS — G9341 Metabolic encephalopathy: Secondary | ICD-10-CM | POA: Diagnosis not present

## 2018-06-20 DIAGNOSIS — M069 Rheumatoid arthritis, unspecified: Secondary | ICD-10-CM | POA: Diagnosis not present

## 2018-06-20 DIAGNOSIS — D631 Anemia in chronic kidney disease: Secondary | ICD-10-CM | POA: Diagnosis not present

## 2018-06-20 DIAGNOSIS — J9601 Acute respiratory failure with hypoxia: Secondary | ICD-10-CM | POA: Diagnosis not present

## 2018-06-20 NOTE — Chronic Care Management (AMB) (Signed)
Chronic Care Management    Clinical Social Work Follow Up Note  06/20/2018 Name: [name] MRN: [id number] DOB: 03-21-1940  [name] is a 78 y.o. year old female who is a primary care patient of No primary care provider on file.. The CCM team was consulted for assistance with Transportation Resources, [employer] and [employer].   Review of patient status, including review of consultants reports, other relevant assessments, and collaboration with appropriate care team members and the patient's provider was performed as part of comprehensive patient evaluation and provision of chronic care management services.     Goals Addressed            This Visit's Progress     Patient Stated   . "I need a ramp" (pt-stated)   On track    Current Barriers:  . Financial constraints . Lacks knowledge of community resource: to assist with ramp installation  Clinical Social Work Clinical Goal(s):  [name] Over the next 30 days, client will work with SW to address concerns related to the ability to exit her home safely - . Over the next 60 days, the patient will follow up with community agencies, as directed by SW  CCM SW Interventions: Completed 06/20/18  Collaboration with [place] with [employer] who advised she had been in contact with Becky. [name] to screen for patients eligibility to receive a temporary ramp  Advised by [name] the patient is scheduled to have a temporary metal ramp installed Monday June 8th.  Patient Self Care Activities:  . Currently UNABLE TO independently exit her home due to concerns with mobility. The patient relies on her spouse and grand-children for assistance   Please see past updates related to this goal by clicking on the "Past Updates" button in the selected goal      . "I need help with transportation" (pt-stated)       Current Barriers:  . ADL IADL limitations - the patient reports difficulty  leaving her home due to mobility . Limited access to caregiver - the patient reports her spouse provides transportation to all medical appointments. Unfortunately, he is only available to provide transportation on Monday's  Clinical Social Work Clinical Goal(s):  [name] Over the next 45 days, client will work with SW to address concerns related to lack of transportation resources Not met- re-established 06/12/18  CCM SW Interventions: Completed 06/20/18  Inbound call from patients spouse [name] who reports attempting to schedule transportation through [employer] but being told the patient is not able to due to incomplete paperwork   Collaboration with [name] with [employer] who apologized for the confusion. Mrs. [name] indicated although the patients application had been received in 2019, the patient never followed up with her eligibility interview  Discussed patient [name] physician appointments with Mrs [name] and concerns with being able to get in and out of a car  Advised by Mrs [name] to resubmit application electronically for review and she would try to complete authorization within the next several days  Advised [name] of plan to resubmit [employer] application for review, CCM SW will look for alternative resources in the event [employer] does not approve the patient to ride  Collaboration with CCM RN Case Manager for [employer]AT part B completion  Electronically submitted SCAT application to [employer] via secure email  Patient Self Care Activities:  . Currently UNABLE TO independently leave her home to access transportation resources  Please see past updates  related to this goal by clicking on the "Past Updates" button in the selected goal         Follow Up Plan: SW will follow up with patient by phone over the next 2 days.   [name], BSW, CDP Social Worker, Certified Dementia Practitioner [place] / [place] Management [phone]  Total time spent performing care  coordination and/or care management activities with the patient by phone or face to face = 15 minutes.

## 2018-06-20 NOTE — Telephone Encounter (Signed)
Patient was last seen in on our office 2018

## 2018-06-20 NOTE — Patient Instructions (Signed)
Social Worker Visit Information  Goals we discussed today:  Goals Addressed            This Visit's Progress     Patient Stated   . "I need a ramp" (pt-stated)   On track    Current Barriers:  . Financial constraints . Lacks knowledge of community resource: to assist with ramp installation  Clinical Social Work Clinical Goal(s):  [name] Over the next 30 days, client will work with SW to address concerns related to the ability to exit her home safely - . Over the next 60 days, the patient will follow up with community agencies, as directed by SW  CCM SW Interventions: Completed 06/20/18  Collaboration with [place] with [employer] who advised she had been in contact with Mr. Aydia Maj to screen for patients eligibility to receive a temporary ramp  Advised by [name] the patient is scheduled to have a temporary metal ramp installed Monday June 8th.  Patient Self Care Activities:  . Currently UNABLE TO independently exit her home due to concerns with mobility. The patient relies on her spouse and grand-children for assistance   Please see past updates related to this goal by clicking on the "Past Updates" button in the selected goal      . "I need help with transportation" (pt-stated)       Current Barriers:  . ADL IADL limitations - the patient reports difficulty leaving her home due to mobility . Limited access to caregiver - the patient reports her spouse provides transportation to all medical appointments. Unfortunately, he is only available to provide transportation on Monday's  Clinical Social Work Clinical Goal(s):  [name] Over the next 45 days, client will work with SW to address concerns related to lack of transportation resources Not met- re-established 06/12/18  CCM SW Interventions: Completed 06/20/18  Inbound call from patients spouse Kiana Hollar who reports attempting to schedule transportation through [employer] but being told the  patient is not able to due to incomplete paperwork   Collaboration with [name] with [employer] who apologized for the confusion. Mrs. [name] indicated although the patients application had been received in 2019, the patient never followed up with her eligibility interview  Discussed patient [place] physician appointments with [name] and concerns with being able to get in and out of a car  Advised by [name] to resubmit application electronically for review and she would try to complete authorization within the next several days  Advised Mr Vinje of plan to resubmit [employer] application for review, CCM SW will look for alternative resources in the event [employer] does not approve the patient to ride  Collaboration with CCM RN Case Manager for [employer] part B completion  Electronically submitted SCAT application to [employer] via secure email  Patient Self Care Activities:  . Currently UNABLE TO independently leave her home to access transportation resources    Please see past updates related to this goal by clicking on the "Past Updates" button in the selected goal          Materials Provided: Verbal education about community resources provided by phone  Follow Up Plan: SW will follow up with patient by phone over the next two days   [name], BSW, CDP Social Worker, Certified Dementia Practitioner [place] / [place] Management [phone]

## 2018-06-21 ENCOUNTER — Telehealth: Payer: Self-pay

## 2018-06-21 ENCOUNTER — Ambulatory Visit: Payer: Self-pay

## 2018-06-21 DIAGNOSIS — R5381 Other malaise: Secondary | ICD-10-CM

## 2018-06-21 NOTE — Patient Instructions (Signed)
Social Worker Visit Information  Goals we discussed today:  Goals Addressed            This Visit's Progress     Patient Stated   . "I need help with transportation" (pt-stated)   On track    Current Barriers:  . ADL IADL limitations - the patient reports difficulty leaving her home due to mobility . Limited access to caregiver - the patient reports her spouse provides transportation to all medical appointments. Unfortunately, he is only available to provide transportation on Monday's  Clinical Social Work Clinical Goal(s):  [name] Over the next 45 days, client will work with SW to address concerns related to lack of transportation resources Not met- re-established 06/12/18  CCM SW Interventions: Completed 06/21/18  Received confirmation from [employer] stating the patient is approved to access [employer] services  Unsuccessful outbound call to the patients spouse to provide SCAT approval as well as the contact number to reserve transportation; voice message left with contact number  Patient Self Care Activities:  . Currently UNABLE TO independently leave her home to access transportation resources   Please see past updates related to this goal by clicking on the "Past Updates" button in the selected goal          Materials Provided: No. Patient not reached.  Follow Up Plan: SW will follow up with patient on Monday June 8   [name], [place], CDP Social Worker, Certified Dementia Practitioner [place] / [place] [facility] [phone]

## 2018-06-21 NOTE — Chronic Care Management (AMB) (Signed)
  Chronic Care Management    Clinical Social Work Follow Up Note  06/21/2018 Name: [name] MRN: [id number] DOB: 07/06/1940  [name] is a 78 y.o. year old female who is a primary care patient of Glendale Chard, MD. The CCM team was consulted for assistance with Transportation Resources and [employer].   Review of patient status, including review of consultants reports, other relevant assessments, and collaboration with appropriate care team members and the patient's provider was performed as part of comprehensive patient evaluation and provision of chronic care management services.     Goals Addressed            This Visit's Progress     Patient Stated   . "I need help with transportation" (pt-stated)   On track    Current Barriers:  . ADL IADL limitations - the patient reports difficulty leaving her home due to mobility . Limited access to caregiver - the patient reports her spouse provides transportation to all medical appointments. Unfortunately, he is only available to provide transportation on Monday's  Clinical Social Work Clinical Goal(s):  [name] Over the next 45 days, client will work with SW to address concerns related to lack of transportation resources Not met- re-established 06/12/18  CCM SW Interventions: Completed 06/21/18  Received confirmation from [employer] stating the patient is approved to access [employer] services  Unsuccessful outbound call to the patients spouse to provide SCAT approval as well as the contact number to reserve transportation; voice message left with contact number  Patient Self Care Activities:  . Currently UNABLE TO independently leave her home to access transportation resources   Please see past updates related to this goal by clicking on the "Past Updates" button in the selected goal          Follow Up Plan: SW will follow up with the patients spouse on Monday June 8th pending no return call is received.   [name], BSW, CDP Social Worker, Certified Dementia Practitioner [place] / [place] [facility] [phone]  Total time spent performing care coordination and/or care management activities with the patient by phone or face to face = 5 minutes.

## 2018-06-21 NOTE — Telephone Encounter (Signed)
Per Dr. Estanislado Pandy patient should get medication from PCP and we will be able to prescribe medication whenever she is able to schedule an appointment in the office. Patient's husband advised and verbalized understanding.

## 2018-06-24 ENCOUNTER — Ambulatory Visit: Payer: Self-pay

## 2018-06-24 ENCOUNTER — Telehealth: Payer: Self-pay

## 2018-06-24 DIAGNOSIS — R5381 Other malaise: Secondary | ICD-10-CM

## 2018-06-24 DIAGNOSIS — R3914 Feeling of incomplete bladder emptying: Secondary | ICD-10-CM | POA: Diagnosis not present

## 2018-06-24 NOTE — Chronic Care Management (AMB) (Signed)
Chronic Care Management    Clinical Social Work Follow Up Note  06/24/2018 Name: [name] MRN: [id number] DOB: 07-16-1940  [name] is a 78 y.o. year old female who is a primary care patient of Glendale Chard, MD. The CCM team was consulted for assistance with Transportation Resources, Food Insecurity and [employer].   Review of patient status, including review of consultants reports, other relevant assessments, and collaboration with appropriate care team members and the patient's provider was performed as part of comprehensive patient evaluation and provision of chronic care management services.    I received an inbound call from the patients spouse and primary caregiver Blaine Hamper. HIPAA identifiers confirmed.   Goals Addressed            This Visit's Progress     Patient Stated   . COMPLETED: "I need a ramp" (pt-stated)       Current Barriers:  . Financial constraints . Lacks knowledge of community resource: to assist with ramp installation  Clinical Social Work Clinical Goal(s):  [name] Over the next 30 days, client will work with SW to address concerns related to the ability to exit her home safely - . Over the next 60 days, the patient will follow up with community agencies, as directed by SW  CCM SW Interventions: Completed 06/24/18  Inbound call from the patients spouse and primary caregiver Hadar Elgersma who reports metal ramp installation completed on 06/24/18 by [employer] with [place] through [place] updating of temporary ramp status until permanent ramp can be provided  Patient Self Care Activities:  . Currently UNABLE TO independently exit her home due to concerns with mobility. The patient relies on her spouse and grand-children for assistance   Please see past updates related to this goal by clicking on the "Past Updates" button in the selected goal      . COMPLETED: "I  need help with transportation" (pt-stated)       Current Barriers:  . ADL IADL limitations - the patient reports difficulty leaving her home due to mobility . Limited access to caregiver - the patient reports her spouse provides transportation to all medical appointments. Unfortunately, he is only available to provide transportation on Monday's  Clinical Social Work Clinical Goal(s):  [name] Over the next 45 days, client will work with SW to address concerns related to lack of transportation resources Not met- re-established 06/12/18  CCM SW Interventions: Completed 06/24/18  Inbound call from the patients spouse and primary caregiver Blaine Hamper  Reviewed patients eligibility to access SCAT transportation services   Assessed Mr Brahmbhatt knowledge of how to access services  Informed by Mr Salmi both he and the patient were currently on the SC[facility] bus en route to a urology appointment  Patient Self Care Activities:  . Currently UNABLE TO independently leave her home to access transportation resources   Please see past updates related to this goal by clicking on the "Past Updates" button in the selected goal        Other   . "she needs a recliner"       Husband Stated  Current Barriers:  . Financial constraints . ADL IADL limitations  Clinical Social Work Clinical Goal(s):  [name] Over the next 45 days, client will work with SW to address concerns related to home equipment needs  Interventions: . CCM SW spoke with patients spouse and primary caregiver Chiyoko Torrico to assess patient needs . Informed  by Mr Hand the home health physical therapist suggested a recliner for the patient to assist with seating while out of bed. Mr. Farrugia reports the patients hospital bed only adjusts height by hand crank at it is difficult to maneuver this at time . Collaboration with [place] regarding hospital bed options . Determined the patients spouse was unable to afford a recliner at  this time . Educated Mr Kolk on the limited resources to assist with purchasing a chair . Collaboration with [place] Coordinator with [facility] to determine if the patient would be eligible to receive a chair under their program . Obtained permission from Mr Tate to send patient referral to [facility] to help with resource identification   Patient Self Care Activities:  . Currently UNABLE TO independently perform ADL's or iADL's due to recent hip fracture  Initial goal documentation        Follow Up Plan: CCM SW will follow up with the patient and her spouse over the next two weeks.   [name], BSW, CDP Social Worker, Certified Dementia Practitioner [place] [facility] [place] [facility] [phone]  Total time spent performing care coordination and/or care management activities with the patient by phone or face to face = 12 minutes.

## 2018-06-24 NOTE — Patient Instructions (Signed)
Social Worker Visit Information  Goals we discussed today:  Goals Addressed            This Visit's Progress     Patient Stated   . COMPLETED: "I need a ramp" (pt-stated)       Current Barriers:  . Financial constraints . Lacks knowledge of community resource: to assist with ramp installation  Clinical Social Work Clinical Goal(s):  [name] Over the next 30 days, client will work with SW to address concerns related to the ability to exit her home safely - . Over the next 60 days, the patient will follow up with community agencies, as directed by SW  CCM SW Interventions: Completed 06/24/18  Inbound call from the patients spouse and primary caregiver Crucita Lacorte who reports metal ramp installation completed on 06/24/18 by [employer] with [place] through [place] updating of temporary ramp status until permanent ramp can be provided  Patient Self Care Activities:  . Currently UNABLE TO independently exit her home due to concerns with mobility. The patient relies on her spouse and grand-children for assistance   Please see past updates related to this goal by clicking on the "Past Updates" button in the selected goal      . COMPLETED: "I need help with transportation" (pt-stated)       Current Barriers:  . ADL IADL limitations - the patient reports difficulty leaving her home due to mobility . Limited access to caregiver - the patient reports her spouse provides transportation to all medical appointments. Unfortunately, he is only available to provide transportation on Monday's  Clinical Social Work Clinical Goal(s):  [name] Over the next 45 days, client will work with SW to address concerns related to lack of transportation resources Not met- re-established 06/12/18  CCM SW Interventions: Completed 06/24/18  Inbound call from the patients spouse and primary caregiver Blaine Hamper  Reviewed patients eligibility to  access SCAT transportation services   Assessed Mr Raben knowledge of how to access services  Informed by Mr Persinger both he and the patient were currently on the [facility] bus en route to a urology appointment  Patient Self Care Activities:  . Currently UNABLE TO independently leave her home to access transportation resources   Please see past updates related to this goal by clicking on the "Past Updates" button in the selected goal        Other   . "she needs a recliner"       Husband Stated  Current Barriers:  . Financial constraints . ADL IADL limitations  Clinical Social Work Clinical Goal(s):  [name] Over the next 45 days, client will work with SW to address concerns related to home equipment needs  Interventions: . CCM SW spoke with patients spouse and primary caregiver Tuesday Terlecki to assess patient needs . Informed by Mr Heggs the home health physical therapist suggested a recliner for the patient to assist with seating while out of bed. Mr. Pellum reports the patients hospital bed only adjusts height by hand crank at it is difficult to maneuver this at time . Collaboration with [place] regarding hospital bed options . Determined the patients spouse was unable to afford a recliner at this time . Educated Mr Duzan on the limited resources to assist with purchasing a chair . Collaboration with [place] Coordinator with [facility] to determine if the patient would be eligible to receive a chair under their  program . Obtained permission from Mr Harr to send patient referral to [place] to help with resource identification   Patient Self Care Activities:  . Currently UNABLE TO independently perform ADL's or iADL's due to recent hip fracture  Initial goal documentation         Materials Provided: Verbal education about community resources provided by phone  Follow Up Plan: CCM SW will follow up with the patient over the next  two weeks.   [name], BSW, CDP Social Worker, Certified Dementia Practitioner [place] [facility] [place] [facility] [phone]

## 2018-06-24 NOTE — Chronic Care Management (AMB) (Signed)
  Chronic Care Management   Outreach Note  06/24/2018 Name: [name] MRN: [id number] DOB: 12/10/1940  Referred by: Glendale Chard, MD Reason for referral : Care Coordination   Second unsuccessful outreach to the patients spouse [name] in reference to patients SCAT application status. HIPAA compliant voice message left requesting a return call.  Follow Up Plan: The care management team will reach out to the patient again over the next 7-10 days.   [name], BSW, CDP Social Worker, Certified Dementia Practitioner [place] [facility] [place] [facility] [phone]  Total time spent performing care coordination and/or care management activities with the patient by phone or face to face = 5 minutes.

## 2018-06-24 NOTE — Progress Notes (Signed)
Virtual Visit via Telephone Note   This visit type was conducted due to national recommendations for restrictions regarding the COVID-19 Pandemic (e.g. social distancing) in an effort to limit this patient's exposure and mitigate transmission in our community.  Due to her co-morbid illnesses, this patient is at least at moderate risk for complications without adequate follow up.  This format is felt to be most appropriate for this patient at this time.  The patient did not have access to video technology/had technical difficulties with video requiring transitioning to audio format only (telephone).  All issues noted in this document were discussed and addressed.  No physical exam could be performed with this format.  Please refer to the patient's chart for her  consent to telehealth for [facility].   Date:  06/25/2018   ID:  [name], DOB [birth year], MRN [id number]  Patient Location: Home Provider Location: Home  PCP:  Patient, No Pcp Per  Cardiologist:  Larae Grooms, MD  Electrophysiologist:  None   Evaluation Performed:  Follow-Up Visit  Chief Complaint:  F/u fall  History of Present Illness:    [name] is a 78 y.o. 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  She has had a tumultous year. In 02/2018 she presented to [facility] with episode of fall, vomiting and unresponsiveness with hypotension. She was intubated in the ED due to vomiting and AMS. She was found to have an inferior STEMI and underwent cath with significant RCA disease treated with PCI/DES. LVEF was >65%, with severe basal septal hypertrophy of the septal wall, dynamic mid  cavity LV obstruction at rest, moderate MR (cannot r/o myxomatous degeneration), mild TR. Hospitalization was complicated by AKI on CKD as well as ABL anemia/GIB requiring transfusion and endoscopy showing gastric AVM s/p clipping. She was discharged on Brilinta monotherapy. SNF was suggested but she declined. She has had failure to thrive since that time. She was in the hospital in 04/2018 with acute on chronic diastolic CHF/encephalopathy and hypoxia requiring diuresis. She has not been to our office since then. An appointment had been scheduled at one point in March, but family cancelled due to pt being recurrently readmitted and it does not appear they called back. She was admitted 05/2018 with fall and hip fracture. Cardiology was consulted about blood thinner therapy and pre-op clearance. She was not able to stop Brilinta due to recent PCI. Hospital course complicated by AKI on CKD with bladder outlet obstruction requiring foley. Last labs 05/2018 showed Hgb 8.2 with suspected ABL anemia, K 4.4, Cr 1.73.  She is seen today virtually by telephone with the help of husband [name]. The patient gave permission for him to help and he provides majority of the history. He says it has been a rough few months. She has not bounced back the way he wishes. She remains very frail and weak. She is working with PT. She also has a nurse coming out 3x a week monitoring status and vitals. Husband [name] denies any recent abnormal vital signs or acute cardiac concerns from his wife. She has not had any CP, SOB, orthopnea, PND, swelling. Her appetite has been good since being home. When I asked her  how she felt she said "pretty good." The patient does not have symptoms concerning for COVID-19 infection (fever, chills, cough, or new shortness of breath).    Past Medical History:  Diagnosis Date  . Acute kidney injury superimposed on chronic kidney disease ([place]) 02/06/2015  . Anxiety   . AVM (arteriovenous malformation)   . CAD  (coronary artery disease) 02/28/2018   a. 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  . Chronic diastolic CHF (congestive heart failure) ([place])   . Depression   . DVT (deep venous thrombosis) ([facility]) 1970s   LLE  . GI bleed 02/2018   a. 02/2018 - ABL anemia/GIB - > endoscopy with gastric AVMs s/p clipping.  [name] Heart murmur   . Hypercholesterolemia   . Hypertension   . Hypertrophic cardiomyopathy ([place])    a. severe basal septal hypertrophy by echo 02/2018 with dynamic mid cavity gradient at rest.  . Kidney stones   . Migraine    "a few/year" (07/16/2015)  . Moderate mitral regurgitation   . Multiple falls   . Muscular deconditioning   . Pneumonia "several times"  . Rheumatoid arthritis(714.0)    "all over" (07/16/2015)  . Seizure [facility])    "last one was 1//2017" (07/16/2015)  . Syncope and collapse   . Vision abnormalities    Past Surgical History:  Procedure Laterality Date  . ABDOMINAL HYSTERECTOMY    . CARPAL TUNNEL RELEASE Bilateral   . CATARACT EXTRACTION W/ INTRAOCULAR LENS  IMPLANT, BILATERAL Bilateral   . CORONARY/GRAFT ACUTE MI REVASCULARIZATION N/A 02/28/2018   Procedure: Coronary/Graft Acute MI Revascularization;  Surgeon: Jettie Booze, MD;  Location: [place] CV LAB;  Service: Cardiovascular;  Laterality: N/A;  . ESOPHAGOGASTRODUODENOSCOPY N/A 03/05/2018   Procedure: ESOPHAGOGASTRODUODENOSCOPY (EGD);  Surgeon: Ladene Artist, MD;  Location[facility] ENDOSCOPY;  Service: Endoscopy;  Laterality: N/A;  . INTRAMEDULLARY (IM) NAIL INTERTROCHANTERIC Left 06/07/2018   Procedure: INTRAMEDULLARY (IM) NAIL INTERTROCHANTRIC;  Surgeon: Erle Crocker, MD;  Location: [place];  Service: Orthopedics;  Laterality: Left;  . LEFT HEART CATH AND CORONARY ANGIOGRAPHY N/A 02/28/2018   Procedure: LEFT HEART CATH AND CORONARY ANGIOGRAPHY;  Surgeon: Jettie Booze, MD;  Location: [place] [facility];  Service: Cardiovascular;  Laterality: N/A;  . NECK EXPLORATION  07/16/2015  .  PARATHYROIDECTOMY Right 07/16/2015   superior  . PARATHYROIDECTOMY Right 07/16/2015   Procedure: RIGHT INFERIOR PARATHYROIDECTOMY;  Surgeon: Armandina Gemma, MD;  Location: [place];  Service: General;  Laterality: Right;  . SUBMUCOSAL INJECTION  03/05/2018   Procedure: SUBMUCOSAL INJECTION;  Surgeon: Ladene Artist, MD;  Location: [facility] ENDOSCOPY;  Service: Endoscopy;;  . TOENAIL AVULSION Bilateral    great toe     Current Meds  Medication Sig  . lactose free nutrition (BOOST PLUS) LIQD Take 237 mLs by mouth daily.     Allergies:   Patient has no known allergies.   Social History   Tobacco Use  . Smoking status: Former Smoker    Packs/day: 1.00    Years: 62.00    Pack years: 62.00    Types: Cigarettes    Last attempt to quit: 01/2018    Years since quitting: 0.4  . Smokeless tobacco: Never Used  Substance Use Topics  . Alcohol use: No  . Drug use: No     Family Hx: The patient's family history includes Cancer in her brother and father; Heart disease in her brother and mother; Miscarriages / [place] in her brother; Pneumonia in her mother;  Prostate cancer in her father.  ROS:   Please see the history of present illness.    All other systems reviewed and are negative.   Prior CV studies:    Most recent pertinent cardiac studies are outlined above.  Labs/Other Tests and Data Reviewed:    EKG:  An ECG dated 06/07/18 was personally reviewed today and demonstrated:  NSR 82bpm, LVH, nonspecific ST upsloping I, II, III, avF, anterior Q waves possibly due to LVH  Recent Labs: 04/01/2018: TSH 0.571 04/22/2018: B Natriuretic Peptide 66.7 04/23/2018: Magnesium 2.1 06/10/2018: ALT 6 06/11/2018: BUN 44; Creatinine, Ser 1.73; Hemoglobin 8.2; Platelets 308; Potassium 4.4; Sodium 141   Recent Lipid Panel Lab Results  Component Value Date/Time   CHOL 168 02/28/2018 05:38 AM   TRIG 42 04/21/2018 07:26 PM   HDL 42 02/28/2018 05:38 AM   CHOLHDL 4.0 02/28/2018 05:38 AM   LDLCALC 120 (H)  02/28/2018 05:38 AM    Wt Readings from Last 3 Encounters:  06/25/18 114 lb (51.7 kg)  06/11/18 80 lb 11 oz (36.6 kg)  04/26/18 106 lb 0.7 oz (48.1 kg)     Objective:    Vital Signs:  BP 122/80   Ht 5\' 1"  (1.549 m)   Wt 114 lb (51.7 kg)   BMI 21.54 kg/m    VS reviewed. General - elderly female in no acute distress Pulm - No labored breathing, no coughing during visit, no audible wheezing, speaking in full sentences Neuro - minimally participative in conversation, no slurred speech, answers questions appropriately but brief answers Psych - calm affect   ASSESSMENT & PLAN:    1. CAD - during recent hospital stay, it was decided by the inpatient cardiology team that she should continue [place]. Will need to watch for bleeding. Reviewed with husband. Recommend attention to anemia on follow-up by primary care. If she develops any recurrence of bleeding, will need to consider switching to Plavix. 2. Possible hypertrophic cardiomyopathy with chronic diastolic CHF - per husband, no signs of volume overload. She has not required a standing diuretic. With her history of falling, weakness, and HCM, I would not start one in the absence of clear cut signs of fluid retention. She was felt to be euvolemic in the hospital as well. Reviewed 2g sodium restriction, 2L fluid restriction, daily weights with patient. 3. Mitral regurgitation - at this point would follow clinically. She does not appear to be a candidate for any sort of surgical intervention in the future. 4. Multiple falls - clearly has had failure to thrive recently. I worry we are seeing evidence of multiorgan dysfunction which forbodes a poor long term prognosis - GI, cardiac, ortho, renal, hematologic. Continue home PT and close primary care involvement.  COVID-19 Education: The signs and symptoms of COVID-19 were discussed with the patient and how to seek care for testing (follow up with PCP or arrange E-visit).  The importance of  social distancing was discussed today.  Time:   Today, I have spent 17 minutes with the patient with telehealth technology discussing the above problems.     Medication Adjustments/Labs and Tests Ordered: Current medicines are reviewed at length with the patient today.  Concerns regarding medicines are outlined above.   Disposition:  Follow up 3-4 months with Dr. Irish Lack  Signed, Charlie Pitter, PA-C  06/25/2018 3:02 PM    [facility]

## 2018-06-25 ENCOUNTER — Telehealth: Payer: Self-pay

## 2018-06-25 ENCOUNTER — Other Ambulatory Visit: Payer: Self-pay

## 2018-06-25 ENCOUNTER — Encounter: Payer: Self-pay | Admitting: Physician Assistant

## 2018-06-25 ENCOUNTER — Telehealth: Payer: Self-pay | Admitting: Physician Assistant

## 2018-06-25 ENCOUNTER — Telehealth (INDEPENDENT_AMBULATORY_CARE_PROVIDER_SITE_OTHER): Payer: Medicare PPO | Admitting: Physician Assistant

## 2018-06-25 ENCOUNTER — Other Ambulatory Visit: Payer: Medicare PPO | Admitting: Internal Medicine

## 2018-06-25 VITALS — BP 122/80 | Ht 61.0 in | Wt 114.0 lb

## 2018-06-25 DIAGNOSIS — G9341 Metabolic encephalopathy: Secondary | ICD-10-CM | POA: Diagnosis not present

## 2018-06-25 DIAGNOSIS — I2510 Atherosclerotic heart disease of native coronary artery without angina pectoris: Secondary | ICD-10-CM | POA: Diagnosis not present

## 2018-06-25 DIAGNOSIS — N183 Chronic kidney disease, stage 3 (moderate): Secondary | ICD-10-CM | POA: Diagnosis not present

## 2018-06-25 DIAGNOSIS — R296 Repeated falls: Secondary | ICD-10-CM

## 2018-06-25 DIAGNOSIS — I340 Nonrheumatic mitral (valve) insufficiency: Secondary | ICD-10-CM

## 2018-06-25 DIAGNOSIS — J9601 Acute respiratory failure with hypoxia: Secondary | ICD-10-CM | POA: Diagnosis not present

## 2018-06-25 DIAGNOSIS — D631 Anemia in chronic kidney disease: Secondary | ICD-10-CM | POA: Diagnosis not present

## 2018-06-25 DIAGNOSIS — I5032 Chronic diastolic (congestive) heart failure: Secondary | ICD-10-CM

## 2018-06-25 DIAGNOSIS — I5031 Acute diastolic (congestive) heart failure: Secondary | ICD-10-CM | POA: Diagnosis not present

## 2018-06-25 DIAGNOSIS — I130 Hypertensive heart and chronic kidney disease with heart failure and stage 1 through stage 4 chronic kidney disease, or unspecified chronic kidney disease: Secondary | ICD-10-CM | POA: Diagnosis not present

## 2018-06-25 DIAGNOSIS — M47894 Other spondylosis, thoracic region: Secondary | ICD-10-CM | POA: Diagnosis not present

## 2018-06-25 DIAGNOSIS — M069 Rheumatoid arthritis, unspecified: Secondary | ICD-10-CM | POA: Diagnosis not present

## 2018-06-25 NOTE — Telephone Encounter (Signed)
Spoke to pt's husband [name] who verbally gave consent for virtual phone visit - 06/25/18

## 2018-06-25 NOTE — Telephone Encounter (Signed)
I returned a call to the pt's cardiologist office and spoke with Rico Junker. [phone]  And was told that there is a concern with the pt's hemoglobin being low when the pt was admitted in the hospital and that it hasn't been checked again since the pt has been dischared and their office wanted to know if Dr. Baird Cancer is going to follow that because Dr. Baird Cancer has home health going out to the pt's home.

## 2018-06-25 NOTE — Telephone Encounter (Signed)
Please contact home health nurse and have them check CBC and ferritin dx: D64.9.

## 2018-06-25 NOTE — Telephone Encounter (Signed)
   Anderson Malta, can you call patient's primary care and find out if they have any plans in place to recheck her hemoglobin? It looks like they are overseeing her active home health orders so would recommend they consider getting follow-up CBC if they haven't already. I did not see one in [place], and it was downtrending when she left the hospital. From cardiology standpoint she is doing relatively OK but will need ongoing management of her anemia by primary care especially since on Brilinta.  Sheridan Gettel PA-C

## 2018-06-25 NOTE — Patient Instructions (Signed)
,  Medication Instructions:  Your physician recommends that you continue on your current medications as directed. Please refer to the Current Medication list given to you today.  If you need a refill on your cardiac medications before your next appointment, please call your pharmacy.   Lab work: None ordered  If you have labs (blood work) drawn today and your tests are completely normal, you will receive your results only by: [name] MyChart Message (if you have MyChart) OR . A paper copy in the mail If you have any lab test that is abnormal or we need to change your treatment, we will call you to review the results.  Testing/Procedures: None ordered  Follow-Up: At [facility], you and your health needs are our priority.  As part of our continuing mission to provide you with exceptional heart care, we have created designated Provider Care Teams.  These Care Teams include your primary Cardiologist (physician) and Advanced Practice Providers (APPs -  Physician Assistants and Nurse Practitioners) who all work together to provide you with the care you need, when you need it. You will need a follow up appointment in 3 months.  Please call our office 2 months in advance to schedule this appointment.  You may see Larae Grooms, MDr one of the following Advanced Practice Providers on your designated Care Team:   Lyda Jester, PA-C Melina Copa, PA-C . Ermalinda Barrios, PA-C  Any Other Special Instructions Will Be Listed  For patients with congestive heart failure, we give them these special instructions:  1. Follow a low-salt diet - you are allowed no more than 2,000mg  of sodium per day. Watch your fluid intake. In general, you should not be taking in more than 2 liters of fluid per day (no more than 8 glasses per day). This includes sources of water in foods like soup, coffee, tea, milk, etc.  2. Weigh yourself on the same scale at same time of day and keep a log.  3. Call your doctor: (Anytime  you feel any of the following symptoms)  - 3lb weight gain overnight or 5lb within a few days  - Shortness of breath, with or without a dry hacking cough  - Swelling in the hands, feet or stomach  - If you have to sleep on extra pillows at night in order to breathe   IT IS IMPORTANT TO LET YOUR DOCTOR KNOW EARLY ON IF YOU ARE HAVING SYMPTOMS SO WE CAN HELP YOU!     If you notice any bleeding such as blood in stool, black tarry stools, blood in urine, nosebleeds or any other unusual bleeding, call your doctor immediately. It is not normal to have this kind of bleeding while on a blood thinner and usually indicates there is an underlying problem with one of your body systems that needs to be checked out.

## 2018-06-25 NOTE — Telephone Encounter (Signed)
Done

## 2018-06-25 NOTE — Telephone Encounter (Signed)
Call placed to pt's PCP, Dr. Glendale Chard, re: phone message below.  Left a message for someone in the office to call me back.

## 2018-06-25 NOTE — Telephone Encounter (Signed)
Katawbba from Dr. Lynder Parents office returned my call.  She said Dr.Sanders is off site doing virtual visits today, but she will send her a message and have home health draw pt's CBC.

## 2018-06-26 ENCOUNTER — Telehealth: Payer: Self-pay

## 2018-06-26 DIAGNOSIS — D631 Anemia in chronic kidney disease: Secondary | ICD-10-CM | POA: Diagnosis not present

## 2018-06-26 DIAGNOSIS — I5031 Acute diastolic (congestive) heart failure: Secondary | ICD-10-CM | POA: Diagnosis not present

## 2018-06-26 DIAGNOSIS — N183 Chronic kidney disease, stage 3 (moderate): Secondary | ICD-10-CM | POA: Diagnosis not present

## 2018-06-26 DIAGNOSIS — M47894 Other spondylosis, thoracic region: Secondary | ICD-10-CM | POA: Diagnosis not present

## 2018-06-26 DIAGNOSIS — I130 Hypertensive heart and chronic kidney disease with heart failure and stage 1 through stage 4 chronic kidney disease, or unspecified chronic kidney disease: Secondary | ICD-10-CM | POA: Diagnosis not present

## 2018-06-26 DIAGNOSIS — J9601 Acute respiratory failure with hypoxia: Secondary | ICD-10-CM | POA: Diagnosis not present

## 2018-06-26 DIAGNOSIS — M069 Rheumatoid arthritis, unspecified: Secondary | ICD-10-CM | POA: Diagnosis not present

## 2018-06-26 DIAGNOSIS — G9341 Metabolic encephalopathy: Secondary | ICD-10-CM | POA: Diagnosis not present

## 2018-06-26 DIAGNOSIS — I2510 Atherosclerotic heart disease of native coronary artery without angina pectoris: Secondary | ICD-10-CM | POA: Diagnosis not present

## 2018-06-27 ENCOUNTER — Other Ambulatory Visit: Payer: Self-pay

## 2018-06-27 ENCOUNTER — Encounter: Payer: Self-pay | Admitting: Internal Medicine

## 2018-06-27 ENCOUNTER — Ambulatory Visit (INDEPENDENT_AMBULATORY_CARE_PROVIDER_SITE_OTHER): Payer: Medicare PPO | Admitting: Internal Medicine

## 2018-06-27 VITALS — BP 126/80 | Ht 61.0 in | Wt 102.0 lb

## 2018-06-27 DIAGNOSIS — M069 Rheumatoid arthritis, unspecified: Secondary | ICD-10-CM | POA: Diagnosis not present

## 2018-06-27 DIAGNOSIS — R413 Other amnesia: Secondary | ICD-10-CM

## 2018-06-27 DIAGNOSIS — I2510 Atherosclerotic heart disease of native coronary artery without angina pectoris: Secondary | ICD-10-CM | POA: Diagnosis not present

## 2018-06-27 DIAGNOSIS — L98422 Non-pressure chronic ulcer of back with fat layer exposed: Secondary | ICD-10-CM

## 2018-06-27 DIAGNOSIS — Z09 Encounter for follow-up examination after completed treatment for conditions other than malignant neoplasm: Secondary | ICD-10-CM | POA: Diagnosis not present

## 2018-06-27 DIAGNOSIS — G9341 Metabolic encephalopathy: Secondary | ICD-10-CM | POA: Diagnosis not present

## 2018-06-27 DIAGNOSIS — M47894 Other spondylosis, thoracic region: Secondary | ICD-10-CM | POA: Diagnosis not present

## 2018-06-27 DIAGNOSIS — S72092S Other fracture of head and neck of left femur, sequela: Secondary | ICD-10-CM | POA: Diagnosis not present

## 2018-06-27 DIAGNOSIS — D631 Anemia in chronic kidney disease: Secondary | ICD-10-CM | POA: Diagnosis not present

## 2018-06-27 DIAGNOSIS — I130 Hypertensive heart and chronic kidney disease with heart failure and stage 1 through stage 4 chronic kidney disease, or unspecified chronic kidney disease: Secondary | ICD-10-CM | POA: Diagnosis not present

## 2018-06-27 DIAGNOSIS — J9601 Acute respiratory failure with hypoxia: Secondary | ICD-10-CM | POA: Diagnosis not present

## 2018-06-27 DIAGNOSIS — N183 Chronic kidney disease, stage 3 (moderate): Secondary | ICD-10-CM | POA: Diagnosis not present

## 2018-06-27 DIAGNOSIS — I5031 Acute diastolic (congestive) heart failure: Secondary | ICD-10-CM | POA: Diagnosis not present

## 2018-06-28 ENCOUNTER — Ambulatory Visit: Payer: Self-pay

## 2018-06-28 ENCOUNTER — Telehealth: Payer: Self-pay

## 2018-06-28 DIAGNOSIS — M47894 Other spondylosis, thoracic region: Secondary | ICD-10-CM | POA: Diagnosis not present

## 2018-06-28 DIAGNOSIS — M059 Rheumatoid arthritis with rheumatoid factor, unspecified: Secondary | ICD-10-CM | POA: Diagnosis not present

## 2018-06-28 DIAGNOSIS — G9341 Metabolic encephalopathy: Secondary | ICD-10-CM | POA: Diagnosis not present

## 2018-06-28 DIAGNOSIS — I130 Hypertensive heart and chronic kidney disease with heart failure and stage 1 through stage 4 chronic kidney disease, or unspecified chronic kidney disease: Secondary | ICD-10-CM | POA: Diagnosis not present

## 2018-06-28 DIAGNOSIS — I2510 Atherosclerotic heart disease of native coronary artery without angina pectoris: Secondary | ICD-10-CM | POA: Diagnosis not present

## 2018-06-28 DIAGNOSIS — I5032 Chronic diastolic (congestive) heart failure: Secondary | ICD-10-CM

## 2018-06-28 DIAGNOSIS — R5381 Other malaise: Secondary | ICD-10-CM

## 2018-06-28 DIAGNOSIS — D631 Anemia in chronic kidney disease: Secondary | ICD-10-CM | POA: Diagnosis not present

## 2018-06-28 DIAGNOSIS — N183 Chronic kidney disease, stage 3 (moderate): Secondary | ICD-10-CM | POA: Diagnosis not present

## 2018-06-28 DIAGNOSIS — J9601 Acute respiratory failure with hypoxia: Secondary | ICD-10-CM | POA: Diagnosis not present

## 2018-06-28 DIAGNOSIS — S72009D Fracture of unspecified part of neck of unspecified femur, subsequent encounter for closed fracture with routine healing: Secondary | ICD-10-CM

## 2018-06-28 DIAGNOSIS — M069 Rheumatoid arthritis, unspecified: Secondary | ICD-10-CM | POA: Diagnosis not present

## 2018-06-28 DIAGNOSIS — I5031 Acute diastolic (congestive) heart failure: Secondary | ICD-10-CM | POA: Diagnosis not present

## 2018-06-28 NOTE — Chronic Care Management (AMB) (Signed)
Chronic Care Management   Follow Up Note   06/28/2018 Name: AZADEH HYDER MRN: [id number] DOB: June 27, 1940  Referred by: Patient, No Pcp Per Reason for referral : Chronic Care Management (Inbound Call from PT)   CHARVI GAMMAGE is a 78 y.o. year old female who is a primary care patient of Patient, No Pcp Per. The CCM team was consulted for assistance with chronic disease management and care coordination needs.    Review of patient status, including review of consultants reports, relevant laboratory and other test results, and collaboration with appropriate care team members and the patient's provider was performed as part of comprehensive patient evaluation and provision of chronic care management services.   Inbound call received from April PT from [facility] to report a patient status update.   Goals Addressed    . "I need help caring for my wife"       Spouse states  Current Barriers:  [name] Knowledge Deficits related to potential complications secondary to immobility  . Lacks caregiver support  . Chronic Disease Management support and education needs related to how to provide total care, obtain DME and utilize the appropriate resources available  Nurse Case Manager Clinical Goal(s):  [name] Over the next 30 days, patient/spouse will verbalize understanding of plan for obtaining DME.  . Over the next 60 days, patient/spouse will verbalize understanding of Palliative Care Services.  . Over the next 90 days, patient will work with the CCM team to improve knowledge and understanding about potential complications associated with patient's impaired physical mobility including complications related to skin breakdown, and pneumonia, malnutrition and DVT.   CCM RN Interventions:  Call completed with Inez Catalina from [place], 06/28/18  . Placed an outbound call to [facility] to follow up on the recent referral for a Palliative Care consultation. I left a voice message with the Palliative Care  Coordinator providing my contact number and hours of nurse availability, requesting a return phone call.   . Return call received from [facility] with [facility], to her knowledge the Palliative Care consultation was completed telephonically by the NP; the NP is out of the office today, she will try to confirm and let the CCM team know  06/28/18 04:19 PM: Inbound call received from April PT from [facility]  . Voice message states Ms. Matthew's BP was elevated during the visit today at 170/100; April reports the patient did not take her morning meds; Mr. Cerniglia was present during the visit and administered Mrs. Corrigan's meds; Mr. Helvey will recheck her BP at home in 1-2 hours using a digital cuff  . April PT would like a return call to discuss Mrs. Worley's progress with PT; she states the patient is not progressing and is not fully engaged in PT; she requested a call back on Monday to further discuss   CCM SW Interventions:  Completed 06/12/18  Outbound call to the patient spouse to follow up on progress of patient goal  Determined the patient has been discharged home from recent inpatient stay due to the families inability to cover SNF co-payments. The patient is also no longer covered under LTC Medicaid for SNF placement  Assessed for patient caregiver concerns/stressors related to patient care - Mr Foskett reports plans to retire within the next 30 days to provide care to the patient. In the meantime Mr Janak will rely on family members and home health to assist with patient care  Educated Mr Tigges on home health restrictions including limitations of number of  visits for home health aide  Obtained verbal permission from Mr Cena to follow up with resources to assist with patient care  Outbound call to [facility] to determine patient [place] date would be 06/13/18 with no ability to be seen sooner due to scheduling  Outbound call to [place] to update on patients transition home-  determined [name] will conduct a joint visit with home health RN on 06/13/18  Outbound call to Palliative Care RN navigator [name] to update of the patients current disposition - [name] will follow up with the patients spouse regarding intake appointment  Collaboration with patients RN Case Manager Glenard Haring Ashlynn Gunnels to provide updates of above interventions  Collaboration with patients primary provider regarding goal progression  Patient Self Care Activities:  . Currently UNABLE TO independently participate in [place]  Please see past updates related to this goal by clicking on the "Past Updates" button in the selected goal         Follow up with provider re: [place], RN,CCM Care Management Coordinator [place] [facility]  Direct Phone: [phone]

## 2018-06-28 NOTE — Telephone Encounter (Signed)
Spoke with Glenard Haring RN with [facility] earlier regarding mutual patient. Update to conversation sent to [facility] that Palliative Care visit was completed and that patient's husband would like to continue full scope of treatment at this time.

## 2018-06-28 NOTE — Chronic Care Management (AMB) (Signed)
Chronic Care Management   Outreach Note  06/28/2018 Name: [name] MRN: [id number] DOB: Aug 15, 1940  Referred by: Patient, No Pcp Per Reason for referral : Chronic Care Management (CCM RNCM Telephone Follow Up )   An unsuccessful telephone outreach was attempted today. The patient was referred to the case management team by Glendale Chard MD for assistance with chronic care management and care coordination. I left a HIPAA compliant voice message for Mr. Majid, providing my contact number and hours of availability, requesting a return phone call.    Goals Addressed    . "I need help caring for my wife"       Spouse states  Current Barriers:  [name] Knowledge Deficits related to potential complications secondary to immobility  . Lacks caregiver support  . Chronic Disease Management support and education needs related to how to provide total care, obtain DME and utilize the appropriate resources available  Nurse Case Manager Clinical Goal(s):  [name] Over the next 30 days, patient/spouse will verbalize understanding of plan for obtaining DME.  . Over the next 60 days, patient/spouse will verbalize understanding of Palliative Care Services.  . Over the next 90 days, patient will work with the CCM team to improve knowledge and understanding about potential complications associated with patient's impaired physical mobility including complications related to skin breakdown, and pneumonia, malnutrition and DVT.   CCM RN Interventions:  Call completed with Inez Catalina from [facility], 06/28/18  . Placed an outbound call to [facility] to follow up on the recent referral for a Palliative Care consultation. I left a voice message with the Palliative Care Coordinator providing my contact number and hours of nurse availability, requesting a return phone call.   . Return call received from [facility] with [facility], to her knowledge the Palliative Care consultation was completed telephonically by the NP; the  NP is out of the office today, she will try to confirm and let the CCM team know  CCM SW Interventions:  Completed 06/12/18  Outbound call to the patient spouse to follow up on progress of patient goal  Determined the patient has been discharged home from recent inpatient stay due to the families inability to cover SNF co-payments. The patient is also no longer covered under LTC Medicaid for SNF placement  Assessed for patient caregiver concerns/stressors related to patient care - Mr Aguirre reports plans to retire within the next 30 days to provide care to the patient. In the meantime Mr Tauzin will rely on family members and home health to assist with patient care  Educated Mr Guiles on home health restrictions including limitations of number of visits for home health aide  Obtained verbal permission from Mr Geist to follow up with resources to assist with patient care  Outbound call to [facility] to determine patient [place] date would be 06/13/18 with no ability to be seen sooner due to scheduling  Outbound call to [place] to update on patients transition home- determined [name] will conduct a joint visit with home health RN on 06/13/18  Outbound call to Palliative Care RN navigator [name] to update of the patients current disposition - [name] will follow up with the patients spouse regarding intake appointment  Collaboration with patients RN Case Manager Glenard Haring [place] to provide updates of above interventions  Collaboration with patients primary provider regarding goal progression  Patient Self Care Activities:  . Currently UNABLE TO independently participate in [place]  Please see past updates related to this goal  by clicking on the "Past Updates" button in the selected goal        Follow Up Plan: The care management team will reach out to the patient again over the next 5-7 days.    [name], RN,CCM Care Management Coordinator [place]  Management/[facility]  Direct Phone: [phone]

## 2018-07-01 ENCOUNTER — Other Ambulatory Visit: Payer: Self-pay

## 2018-07-01 ENCOUNTER — Telehealth: Payer: Self-pay

## 2018-07-01 ENCOUNTER — Ambulatory Visit: Payer: Self-pay

## 2018-07-01 ENCOUNTER — Encounter: Payer: Self-pay | Admitting: Internal Medicine

## 2018-07-01 DIAGNOSIS — R5381 Other malaise: Secondary | ICD-10-CM

## 2018-07-01 DIAGNOSIS — I130 Hypertensive heart and chronic kidney disease with heart failure and stage 1 through stage 4 chronic kidney disease, or unspecified chronic kidney disease: Secondary | ICD-10-CM | POA: Diagnosis not present

## 2018-07-01 DIAGNOSIS — I5032 Chronic diastolic (congestive) heart failure: Secondary | ICD-10-CM | POA: Diagnosis not present

## 2018-07-01 DIAGNOSIS — M059 Rheumatoid arthritis with rheumatoid factor, unspecified: Secondary | ICD-10-CM

## 2018-07-01 DIAGNOSIS — S72009D Fracture of unspecified part of neck of unspecified femur, subsequent encounter for closed fracture with routine healing: Secondary | ICD-10-CM

## 2018-07-01 DIAGNOSIS — S72142A Displaced intertrochanteric fracture of left femur, initial encounter for closed fracture: Secondary | ICD-10-CM | POA: Diagnosis not present

## 2018-07-01 NOTE — Chronic Care Management (AMB) (Signed)
  Chronic Care Management   Follow Up Note   07/01/2018 Name: [name] MRN: [id number] DOB: 1940/03/11  Referred by: Patient, No Pcp Per Reason for referral : Chronic Care Management (CCM RNCM Telephone Collaboration )   TEYANNA THIELMAN is a 78 y.o. year old female who is a primary care patient of Patient, No Pcp Per. The CCM team was consulted for assistance with chronic disease management and care coordination needs.    Review of patient status, including review of consultants reports, relevant laboratory and other test results, and collaboration with appropriate care team members and the patient's provider was performed as part of comprehensive patient evaluation and provision of chronic care management services.    Inbound call received from nurse Alma Downs from [place] regarding patient status update.   Goals Addressed    . "I need help caring for my wife"       Spouse states  Current Barriers:  [name] Knowledge Deficits related to potential complications secondary to immobility  . Lacks caregiver support  . Chronic Disease Management support and education needs related to how to provide total care, obtain DME and utilize the appropriate resources available  Nurse Case Manager Clinical Goal(s):  [name] Over the next 30 days, patient/spouse will verbalize understanding of plan for obtaining DME.  . Over the next 60 days, patient/spouse will verbalize understanding of Palliative Care Services.  . Over the next 90 days, patient will work with the CCM team to improve knowledge and understanding about potential complications associated with patient's impaired physical mobility including complications related to skin breakdown, and pneumonia, malnutrition and DVT.   CCM RN Interventions:  07/01/18: 11:30 AM Inbound call received from nurse Alma Downs [phone] from [facility]   . Discussed patient currently has two Stage 2 decubitus ulcers to her right and left mid sacrum  .  Discussed patient's mood is quiet and patient does not engage in care during visits . Discussed Mr. Fredricksen reported to the PTA that he understands that Mrs. Nijjar may be approaching End of Life . Discussed Palliative Care is in place and Mr. Patton wishes to leave all [facility] services in place along with Palliative Care at this time per NP Amy Daniels/[facility] . Discussed nurse Alma Downs has increased her nurse visit frequency due to skin breakdown . Discussed she will have End of Life conversation with Mr. Chargois at next visit scheduled for tomorrow . Advised nurse Alma Downs I have sent Dr. Baird Cancer a message asking to consider upgrading patient's mattress to an air mattress or to an air flow bed . Discussed plans for ongoing care management follow up and encouraged Sh[place] to contact the CCM team as deemed appropriate for patient care updates and assistance with Care Coordination   Patient Self Care Activities:  . Currently UNABLE TO independently participate in [place]  Please see past updates related to this goal by clicking on the "Past Updates" button in the selected goal       Follow up with provider re: orders for change of mattress for improved pressure reducing support surface  [name], RN, BSN, CCM Care Management Coordinator [place] [facility]  Direct Phone: [phone]

## 2018-07-01 NOTE — Chronic Care Management (AMB) (Signed)
  Chronic Care Management   Outreach Note  07/01/2018 Name: [name] MRN: [id number] DOB: 14-Mar-1940  Referred by: Glendale Chard, MD Reason for referral : Care Coordination   An unsuccessful telephone outreach was attempted today. The patient was referred to the case management team by for assistance with chronic care management and care coordination.   Follow Up Plan: The care management team will reach out to the patient again over the next 7-10 days.   [name], BSW, CDP Social Worker, Certified Dementia Practitioner [place] [facility] [place] [facility] [phone]  Total time spent performing care coordination and/or care management activities with the patient by phone or face to face = 8 minutes.

## 2018-07-01 NOTE — Chronic Care Management (AMB) (Signed)
Chronic Care Management   Follow Up Note   07/01/2018 Name: JANIYHA MONTUFAR MRN: [id number] DOB: Dec 16, 1940  Referred by: Patient, No Pcp Per Reason for referral : Chronic Care Management (CCM RNCM Telephone Collaboration)   VI BIDDINGER is a 78 y.o. year old female who is a primary care patient of Patient, No Pcp Per. The CCM team was consulted for assistance with chronic disease management and care coordination needs.    Review of patient status, including review of consultants reports, relevant laboratory and other test results, and collaboration with appropriate care team members and the patient's provider was performed as part of comprehensive patient evaluation and provision of chronic care management services.    Telephone collaboration with Cresenciano Genre from [place].   Goals Addressed    . "I need help caring for my wife"       Spouse states  Current Barriers:  [name] Knowledge Deficits related to potential complications secondary to immobility  . Lacks caregiver support  . Chronic Disease Management support and education needs related to how to provide total care, obtain DME and utilize the appropriate resources available  Nurse Case Manager Clinical Goal(s):  [name] Over the next 30 days, patient/spouse will verbalize understanding of plan for obtaining DME.  . Over the next 60 days, patient/spouse will verbalize understanding of Palliative Care Services.  . Over the next 90 days, patient will work with the CCM team to improve knowledge and understanding about potential complications associated with patient's impaired physical mobility including complications related to skin breakdown, and pneumonia, malnutrition and DVT.   CCM RN Interventions:  07/01/18 Telephone collaboration with Claiborne Billings PTA from [place]   . Outbound call placed to [facility] with [place] [phone] . Claiborne Billings voiced concerns regarding [name] slow progression with PT; states patient does  not participate and is difficult to engage in therapy . Claiborne Billings reports Mr. Decoursey states she is often uncooperative with him and does not like to turn and shift her weight when needed . Claiborne Billings reports Mrs. Maltos has at least two Stage 2 decubitus ulcers on her sacrum . Discussed Claiborne Billings will ask the assigned nurse Agapito Games to give me a call with an update . Received an in basket message from [employer] from [place] confirming Hanley Ben NP completed a telephone consultation with Mr. Poinsett last week and he has agreed to Palliative Care for Mrs. Shipper . Sent in-basket message to Dr. Baird Cancer with an update regarding concerns reported by Claiborne Billings PTA and discussed possibly order an air floating bed and or air mattress in place of the gel overlay    CCM SW Interventions:  Completed 06/12/18  Outbound call to the patient spouse to follow up on progress of patient goal  Determined the patient has been discharged home from recent inpatient stay due to the families inability to cover SNF co-payments. The patient is also no longer covered under LTC Medicaid for SNF placement  Assessed for patient caregiver concerns/stressors related to patient care - Mr Kilmartin reports plans to retire within the next 30 days to provide care to the patient. In the meantime Mr Melaragno will rely on family members and home health to assist with patient care  Educated Mr Speth on home health restrictions including limitations of number of visits for home health aide  Obtained verbal permission from Mr Kirchner to follow up with resources to assist with patient care  Outbound call to [facility] to determine patient [place] date would be  06/13/18 with no ability to be seen sooner due to scheduling  Outbound call to [place] to update on patients transition home- determined [name] will conduct a joint visit with home health RN on 06/13/18  Outbound call to Palliative Care RN navigator [name] to update of the patients  current disposition - [name] will follow up with the patients spouse regarding intake appointment  Collaboration with patients RN Case Manager Glenard Haring Darrel Baroni to provide updates of above interventions  Collaboration with patients primary provider regarding goal progression  Patient Self Care Activities:  . Currently UNABLE TO independently participate in [place]  Please see past updates related to this goal by clicking on the "Past Updates" button in the selected goal         The care management team will reach out to the patient again over the next 5 days.    [name], RN, BSN, CCM Care Management Coordinator [place] [facility]  Direct Phone: [phone]

## 2018-07-01 NOTE — Patient Instructions (Signed)
Visit Information  Goals Addressed    . "I need help caring for my wife"       Spouse states  Current Barriers:  [name] Knowledge Deficits related to potential complications secondary to immobility  . Lacks caregiver support  . Chronic Disease Management support and education needs related to how to provide total care, obtain DME and utilize the appropriate resources available  Nurse Case Manager Clinical Goal(s):  [name] Over the next 30 days, patient/spouse will verbalize understanding of plan for obtaining DME.  . Over the next 60 days, patient/spouse will verbalize understanding of Palliative Care Services.  . Over the next 90 days, patient will work with the CCM team to improve knowledge and understanding about potential complications associated with patient's impaired physical mobility including complications related to skin breakdown, and pneumonia, malnutrition and DVT.   CCM RN Interventions:  07/01/18 Telephone collaboration with Claiborne Billings PTA from [place]   . Outbound call placed to [facility] with [place] [phone] . Claiborne Billings voiced concerns regarding [name] slow progression with PT; states patient does not participate and is difficult to engage in therapy . Claiborne Billings reports Mr. Schuknecht states she is often uncooperative with him and does not like to turn and shift her weight when needed . Claiborne Billings reports Mrs. Mariani has at least two Stage 2 decubitus ulcers on her sacrum . Discussed Claiborne Billings will ask the assigned nurse Agapito Games to give me a call with an update . Received an in basket message from [employer] from [place] confirming Hanley Ben NP completed a telephone consultation with Mr. Krogh last week and he has agreed to Palliative Care for Mrs. Kiker . Sent in-basket message to Dr. Baird Cancer with an update regarding concerns reported by Claiborne Billings PTA and discussed possibly order an air floating bed and or air mattress in place of the gel overlay    CCM SW Interventions:   Completed 06/12/18  Outbound call to the patient spouse to follow up on progress of patient goal  Determined the patient has been discharged home from recent inpatient stay due to the families inability to cover SNF co-payments. The patient is also no longer covered under LTC Medicaid for SNF placement  Assessed for patient caregiver concerns/stressors related to patient care - Mr Stricker reports plans to retire within the next 30 days to provide care to the patient. In the meantime Mr Puccini will rely on family members and home health to assist with patient care  Educated Mr Barno on home health restrictions including limitations of number of visits for home health aide  Obtained verbal permission from Mr Captain to follow up with resources to assist with patient care  Outbound call to [facility] to determine patient [place] date would be 06/13/18 with no ability to be seen sooner due to scheduling  Outbound call to [place] to update on patients transition home- determined [name] will conduct a joint visit with home health RN on 06/13/18  Outbound call to Palliative Care RN navigator [name] to update of the patients current disposition - [name] will follow up with the patients spouse regarding intake appointment  Collaboration with patients RN Case Manager Glenard Haring Marshell Dilauro to provide updates of above interventions  Collaboration with patients primary provider regarding goal progression  Patient Self Care Activities:  . Currently UNABLE TO independently participate in [place]  Please see past updates related to this goal by clicking on the "Past Updates" button in the selected goal  The patient verbalized understanding of instructions provided today and declined a print copy of patient instruction materials.   The care management team will reach out to the patient again over the next 5 days.   [name], RN, BSN, CCM Care Management Coordinator [place]  Management/[facility]  Direct Phone: [phone]

## 2018-07-01 NOTE — Progress Notes (Addendum)
Virtual Visit via Video   This visit type was conducted due to national recommendations for restrictions regarding the COVID-19 Pandemic (e.g. social distancing) in an effort to limit this patient's exposure and mitigate transmission in our community.  Due to her co-morbid illnesses, this patient is at least at moderate risk for complications without adequate follow up.  This format is felt to be most appropriate for this patient at this time.  All issues noted in this document were discussed and addressed.  A limited physical exam was performed with this format.    This visit type was conducted due to national recommendations for restrictions regarding the COVID-19 Pandemic (e.g. social distancing) in an effort to limit this patient's exposure and mitigate transmission in our community.  Patients identity confirmed using two different identifiers.  This format is felt to be most appropriate for this patient at this time.  All issues noted in this document were discussed and addressed.  No physical exam was performed (except for noted visual exam findings with Video Visits).    Date:  07/01/2018   ID:  [name], DOB Oct 31, 1940, MRN [id number]  Patient Location:  Home, accompanied by her husband  Provider location:   Office    Chief Complaint:  Hospital f/u  History of Present Illness:    [name] is a 78 y.o. female who presents via video conferencing for a telehealth visit today.    The patient does not have symptoms concerning for COVID-19 infection (fever, chills, cough, or new shortness of breath).   She presents today for virtual visit. She prefers this method of contact due to COVID-19 pandemic.  She is accompanied by her husband for this visit. This is a hospital f/u - visit also scheduled virtually because he has difficulty taking her in and out of the home. She did just receive a wheelchair ramp.    She presented to [facility] ED with complaints of left hip and  shoulder pain on 5/21. Patient reported she fell the previous day  when she was trying to go to the trash can and fell against the refrigerator. EMS was called, patient declined further evaluation at that time. Patient continued with significant pain and immobility today which prompted her presentation. ER evaluation was significant for left intertrochanteric hip fracture. She presented for surgery on 5/22. She was scheduled for transfer to SNF on 5/26; however, due to financial constraints - she was unable to do so. She has not had any issues with her hip since discharge. She has yet to have Ortho f/u.   Past Medical History:  Diagnosis Date  . Acute kidney injury superimposed on chronic kidney disease ([place]) 02/06/2015  . Anxiety   . AVM (arteriovenous malformation)   . CAD (coronary artery disease) 02/28/2018   a. 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  . Chronic diastolic CHF (congestive heart failure) ([place])   . Depression   . DVT (deep venous thrombosis) (HCC) 1970s   LLE  . GI bleed 02/2018   a. 02/2018 - ABL anemia/GIB - > endoscopy with gastric AVMs s/p clipping.  [name] Heart murmur   . Hypercholesterolemia   . Hypertension   . Hypertrophic cardiomyopathy ([place])    a. severe basal septal hypertrophy by echo 02/2018 with dynamic mid cavity gradient at rest.  . Kidney stones   . Migraine    "a few/year" (07/16/2015)  . Moderate mitral regurgitation   . Multiple falls   . Muscular  deconditioning   . Pneumonia "several times"  . Rheumatoid arthritis(714.0)    "all over" (07/16/2015)  . Seizure [facility])    "last one was 1//2017" (07/16/2015)  . Syncope and collapse   . Vision abnormalities    Past Surgical History:  Procedure Laterality Date  . ABDOMINAL HYSTERECTOMY    . CARPAL TUNNEL RELEASE Bilateral   . CATARACT EXTRACTION W/ INTRAOCULAR LENS  IMPLANT, BILATERAL Bilateral   . CORONARY/GRAFT ACUTE MI REVASCULARIZATION N/A 02/28/2018   Procedure: Coronary/Graft Acute MI  Revascularization;  Surgeon: Jettie Booze, MD;  Location: [place] [facility];  Service: Cardiovascular;  Laterality: N/A;  . ESOPHAGOGASTRODUODENOSCOPY N/A 03/05/2018   Procedure: ESOPHAGOGASTRODUODENOSCOPY (EGD);  Surgeon: Ladene Artist, MD;  Location: [facility] ENDOSCOPY;  Service: Endoscopy;  Laterality: N/A;  . INTRAMEDULLARY (IM) NAIL INTERTROCHANTERIC Left 06/07/2018   Procedure: INTRAMEDULLARY (IM) NAIL INTERTROCHANTRIC;  Surgeon: Erle Crocker, MD;  Location: [place];  Service: Orthopedics;  Laterality: Left;  . LEFT HEART CATH AND CORONARY ANGIOGRAPHY N/A 02/28/2018   Procedure: LEFT HEART CATH AND CORONARY ANGIOGRAPHY;  Surgeon: Jettie Booze, MD;  Location: [place] [facility];  Service: Cardiovascular;  Laterality: N/A;  . NECK EXPLORATION  07/16/2015  . PARATHYROIDECTOMY Right 07/16/2015   superior  . PARATHYROIDECTOMY Right 07/16/2015   Procedure: RIGHT INFERIOR PARATHYROIDECTOMY;  Surgeon: Armandina Gemma, MD;  Location: [place];  Service: General;  Laterality: Right;  . SUBMUCOSAL INJECTION  03/05/2018   Procedure: SUBMUCOSAL INJECTION;  Surgeon: Ladene Artist, MD;  Location: [facility] ENDOSCOPY;  Service: Endoscopy;;  . TOENAIL AVULSION Bilateral    great toe     Current Meds  Medication Sig  . atorvastatin (LIPITOR) 80 MG tablet Take 1 tablet (80 mg total) by mouth daily at 6 PM.  . carvedilol (COREG) 12.5 MG tablet Take 2 tablets (25 mg total) by mouth 2 (two) times daily with a meal.  . Cholecalciferol (VITAMIN D) 125 MCG (5000 UT) CAPS Take 1 capsule by mouth daily.  . hydroxychloroquine (PLAQUENIL) 200 MG tablet TAKE 1 TABLET BY MOUTH EVERY DAY  . lactose free nutrition (BOOST PLUS) LIQD Take 237 mLs by mouth daily.  [name] levETIRAcetam (KEPPRA) 500 MG tablet TAKE 2 TABLETS BY MOUTH EVERY DAY  . mirtazapine (REMERON) 15 MG tablet Take 1 tablet (15 mg total) by mouth at bedtime for 30 days.  . pantoprazole (PROTONIX) 40 MG tablet Take 1 tablet (40 mg total) by mouth daily.   . ticagrelor (BRILINTA) 90 MG TABS tablet Take 1 tablet (90 mg total) by mouth 2 (two) times daily.     Allergies:   Patient has no known allergies.   Social History   Tobacco Use  . Smoking status: Former Smoker    Packs/day: 1.00    Years: 62.00    Pack years: 62.00    Types: Cigarettes    Quit date: 01/2018    Years since quitting: 0.4  . Smokeless tobacco: Never Used  Substance Use Topics  . Alcohol use: No  . Drug use: No     Family Hx: The patient's family history includes Cancer in her brother and father; Heart disease in her brother and mother; Miscarriages / Stillbirths in her brother; Pneumonia in her mother; Prostate cancer in her father.  ROS:   Please see the history of present illness.    Review of Systems  Constitutional: Negative.   Respiratory: Negative.   Cardiovascular: Negative.   Gastrointestinal: Negative.   Neurological: Negative.  Pt's husband reports that pt is having issues with her memory. She has become more forgetful.   Psychiatric/Behavioral: Negative.     All other systems reviewed and are negative.   Labs/Other Tests and Data Reviewed:    Recent Labs: 04/01/2018: TSH 0.571 04/22/2018: B Natriuretic Peptide 66.7 04/23/2018: Magnesium 2.1 06/10/2018: ALT 6 06/11/2018: BUN 44; Creatinine, Ser 1.73; Hemoglobin 8.2; Platelets 308; Potassium 4.4; Sodium 141   Recent Lipid Panel Lab Results  Component Value Date/Time   CHOL 168 02/28/2018 05:38 AM   TRIG 42 04/21/2018 07:26 PM   HDL 42 02/28/2018 05:38 AM   CHOLHDL 4.0 02/28/2018 05:38 AM   LDLCALC 120 (H) 02/28/2018 05:38 AM    Wt Readings from Last 3 Encounters:  06/27/18 102 lb (46.3 kg)  06/25/18 114 lb (51.7 kg)  06/11/18 80 lb 11 oz (36.6 kg)     Exam:    Vital Signs:  BP 126/80 (BP Location: Left Arm, Patient Position: Sitting, Cuff Size: Normal) Comment: pt provided  Ht 5\' 1"  (1.549 m)   Wt 102 lb (46.3 kg) Comment: pt provided  BMI 19.27 kg/m     Physical Exam   Constitutional:  Appears frail, lying in bed She is communicative.   HENT:  Head: Normocephalic and atraumatic.  Neck: Normal range of motion.  Pulmonary/Chest: Effort normal.  Neurological: She is alert.  Nursing note and vitals reviewed.   ASSESSMENT & PLAN:     1. Closed comminuted fracture of left hip with routine healing, sequelae  TCM PERFORMED. A MEMBER OF THE CLINICAL TEAM SPOKE WITH THE PATIENT UPON DISCHARGE. DISCHARGE SUMMARY WAS REVIEWED IN FULL DETAIL DURING THE VISIT. MEDS RECONCILED AND COMPARED TO DISCHARGE MEDS. MEDICATION LIST WAS UPDATED AND REVIEWED WITH THE PATIENT. GREATER THAN 50% FACE TO FACE TIME WAS SPENT IN COUNSELING AND COORDINATION OF CARE. SHE IS ENCOURAGED TO F/U WITH ORTHO AS SCHEDULED. SHE IS NOT C/O PAIN AT THIS TIME. ALL QUESTIONS WERE ANSWERED TO THE SATISFACTION OF THE PATIENT AND HER HUSBAND.    2. 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 Controlled. She will continue with current meds.    3. Coronary artery disease involving native coronary artery of native heart without angina pectoris  Newly diagnosed. Importance of medication compliance was discussed with the patient.  4. Memory loss  I will order bloodwork including CBC, B12, TSH, BMP, FE/TIBC. If all bloodwork is within normal limits, I plan to check urinalysis. I will be sure to fax orders to [facility].   5. Skin ulcer of sacrum with fat layer exposed ([place])  We discussed need for proper nutrition to help ulcers heal. I will send rx Prostate protein drink into [facility] in hopes that they will be able to provide this to her.  Pt/and her husband advised that wound care nurse will apply gel dressing to affected lesions. Pt and her husband reminded that she must move every 2-3 hours to ensure that no new ulcers develop. She will also benefit from an low air loss mattress to  decrease her risk of developing more in the future.      COVID-19 Education: The signs and symptoms of COVID-19 were discussed with the patient and how to seek care for testing (follow up with PCP or arrange E-visit).  The importance of social distancing was discussed today.  Patient Risk:   After full review of this patients clinical  status, I feel that they are at least moderate risk at this time.  Time:   Today, I have spent 17 minutes/ 30 seconds with the patient with telehealth technology discussing above diagnoses.     Medication Adjustments/Labs and Tests Ordered: Current medicines are reviewed at length with the patient today.  Concerns regarding medicines are outlined above.   Tests Ordered: No orders of the defined types were placed in this encounter.   Medication Changes: No orders of the defined types were placed in this encounter.   Disposition:  Follow up in 3 month(s)  Signed, Maximino Greenland, MD

## 2018-07-02 ENCOUNTER — Ambulatory Visit: Payer: Self-pay

## 2018-07-02 DIAGNOSIS — R5381 Other malaise: Secondary | ICD-10-CM

## 2018-07-02 DIAGNOSIS — I5031 Acute diastolic (congestive) heart failure: Secondary | ICD-10-CM | POA: Diagnosis not present

## 2018-07-02 DIAGNOSIS — M059 Rheumatoid arthritis with rheumatoid factor, unspecified: Secondary | ICD-10-CM | POA: Diagnosis not present

## 2018-07-02 DIAGNOSIS — D631 Anemia in chronic kidney disease: Secondary | ICD-10-CM | POA: Diagnosis not present

## 2018-07-02 DIAGNOSIS — I5032 Chronic diastolic (congestive) heart failure: Secondary | ICD-10-CM | POA: Diagnosis not present

## 2018-07-02 DIAGNOSIS — I130 Hypertensive heart and chronic kidney disease with heart failure and stage 1 through stage 4 chronic kidney disease, or unspecified chronic kidney disease: Secondary | ICD-10-CM | POA: Diagnosis not present

## 2018-07-02 DIAGNOSIS — M47894 Other spondylosis, thoracic region: Secondary | ICD-10-CM | POA: Diagnosis not present

## 2018-07-02 DIAGNOSIS — I2510 Atherosclerotic heart disease of native coronary artery without angina pectoris: Secondary | ICD-10-CM | POA: Diagnosis not present

## 2018-07-02 DIAGNOSIS — G9341 Metabolic encephalopathy: Secondary | ICD-10-CM | POA: Diagnosis not present

## 2018-07-02 DIAGNOSIS — N183 Chronic kidney disease, stage 3 (moderate): Secondary | ICD-10-CM | POA: Diagnosis not present

## 2018-07-02 DIAGNOSIS — M069 Rheumatoid arthritis, unspecified: Secondary | ICD-10-CM | POA: Diagnosis not present

## 2018-07-02 DIAGNOSIS — J9601 Acute respiratory failure with hypoxia: Secondary | ICD-10-CM | POA: Diagnosis not present

## 2018-07-02 NOTE — Chronic Care Management (AMB) (Signed)
Chronic Care Management   Follow Up Note   07/02/2018 Name: [name] MRN: [id number] DOB: [birth year]  Referred by: Patient, No Pcp Per Reason for referral : Chronic Care Management (CCM RNCM Case Collaboration )   [name] is a 78 y.o. year old female who is a primary care patient of Patient, No Pcp Per. The CCM team was consulted for assistance with chronic disease management and care coordination needs.    Review of patient status, including review of consultants reports, relevant laboratory and other test results, and collaboration with appropriate care team members and the patient's provider was performed as part of comprehensive patient evaluation and provision of chronic care management services.    I received a call from [facility] from [place] with a patient update. I placed an outbound call to [place] to follow up on DME for improved pressure reduction.   Goals Addressed     "I need help caring for my wife"       Spouse states  Current Barriers:   Knowledge Deficits related to potential complications secondary to immobility   Lacks caregiver support   Chronic Disease Management support and education needs related to how to provide total care, obtain DME and utilize the appropriate resources available  Nurse Case Manager Clinical Goal(s):   Over the next 30 days, patient/spouse will verbalize understanding of plan for obtaining DME.   Over the next 60 days, patient/spouse will verbalize understanding of Palliative Care Services.   Over the next 90 days, patient will work with the CCM team to improve knowledge and understanding about potential complications associated with patient's impaired physical mobility including complications related to skin breakdown, and pneumonia, malnutrition and DVT.   CCM RN Interventions:  07/02/18: 11:47 AM Inbound call received from nurse [name] [phone] from [place]  completed a home visit with [name] today; she had an End of Life discussion with [name]; she reports [name] is not ready for Hospice for [name] and would like to leave the current services in place  [name] the CCM team will continue to engage with [name] and will assess for End of Life care and will coordinate accordingly  Discussed [name] has approved patient having an air flow bed or air mattress for improved weight bearing prevention   Collaboration with embedded [place] regarding patient status; discussed patient may benefit from taking Prostat a medical nutritional supplement to aid in wound healing  07/02/18: Outbound call placed to [place]   Spoke with [name] CSR; discussed options for upgrading patient's bed or mattress for stage 2 decubitus ulcerations   Discussed options to include a low air loss mattress or an alternating pressure pump and pad   Discussed PCP will need to fax in a new referral for the DME needed and include all necessary diagnosis including the stage 2 decubitus ulcers  Discussed the referral should be faxed to [phone]  Discussed availability of a prescription protein drink, Prostat used to help with wound healing; discussed a referral for this product should also be faxed in to [place] although the rep is unsure if this product is supplied by [facility] in basket message to [name] marked high priority with an DME update  Patient Self Care Activities:   Currently UNABLE TO independently participate in [place]  Please see past updates related to this goal by clicking on the "  Past Updates" button in the selected goal         Telephone follow up appointment with care management team member scheduled for: 07/04/18  Becky Merino, RN, BSN, CCM Care Management Coordinator [place] [facility]  Direct Phone: [phone]

## 2018-07-03 ENCOUNTER — Other Ambulatory Visit: Payer: Self-pay

## 2018-07-03 ENCOUNTER — Ambulatory Visit: Payer: Self-pay

## 2018-07-03 ENCOUNTER — Telehealth: Payer: Self-pay

## 2018-07-03 DIAGNOSIS — I5032 Chronic diastolic (congestive) heart failure: Secondary | ICD-10-CM

## 2018-07-03 DIAGNOSIS — I130 Hypertensive heart and chronic kidney disease with heart failure and stage 1 through stage 4 chronic kidney disease, or unspecified chronic kidney disease: Secondary | ICD-10-CM

## 2018-07-03 DIAGNOSIS — R5381 Other malaise: Secondary | ICD-10-CM

## 2018-07-03 DIAGNOSIS — M059 Rheumatoid arthritis with rheumatoid factor, unspecified: Secondary | ICD-10-CM | POA: Diagnosis not present

## 2018-07-03 NOTE — Patient Instructions (Signed)
Visit Information  Goals    . "I need help caring for my wife"     Spouse states  Current Barriers:  [name] Knowledge Deficits related to potential complications secondary to immobility  . Lacks caregiver support  . Chronic Disease Management support and education needs related to how to provide total care, obtain DME and utilize the appropriate resources available  Nurse Case Manager Clinical Goal(s):  [name] Over the next 30 days, patient/spouse will verbalize understanding of plan for obtaining DME.  . Over the next 60 days, patient/spouse will verbalize understanding of Palliative Care Services.  . Over the next 90 days, patient will work with the CCM team to improve knowledge and understanding about potential complications associated with patient's impaired physical mobility including complications related to skin breakdown, and pneumonia, malnutrition and DVT.   CCM RN Interventions:  07/03/18 Completed call with Blaine Hamper, husband  . Discussed questions/concerns related to patient's treatment plan and services that are in place - husband denies having questions and is very happy with the care and DME that are currently in place for patient . Discussed and reinforced importance of skin breakdown prevention; reinforced importance of turning patient, shifting weight and using pillows for support, getting patient out of bed as directed by PT/OT and assisting patient with ROM and HEP as directed  . Discussed Dr. Baird Cancer will send an order for a new air mattress that will help reduce pressure and help prevent further skin breakdown . Discussed adherence to having patient drink her protein supplements to help with protein and wound healing - husband reports patient is drinking 3 per day, prefers Boost and is drinking a breakfast protein shake . Discussed outcome of OV with Orthopedic surgeon completed on 07/02/18; spouse reports the doctor is pleased with how Mrs. Charbonneau's hip is healing, spouse  reports no change in the current treatment plan - Mr. Gillentine notified the Orthopedic surgeon, Mrs. Southgate is still c/o pain to her effected extremity with light weight bearing - MD reassured this to be expected until complete healing occurs . Discussed PTA's concerns that PT is moving slowly and Mrs. Biglow is not well engaged - Mr. Bove reports she has just started turning herself and he feels this will improve as she gets stronger . Discussed plans with patient for ongoing care management follow up and provided patient with direct contact information for care management team . Husband verbalizes understanding of the information/education provided today   Patient Self Care Activities:  . Currently UNABLE TO independently participate in [place]  Please see past updates related to this goal by clicking on the "Past Updates" button in the selected goal     . "she needs a recliner"     Husband Stated  Current Barriers:  . Financial constraints . ADL IADL limitations  Clinical Social Work Clinical Goal(s):  [name] Over the next 45 days, client will work with SW to address concerns related to home equipment needs  Interventions: . CCM SW spoke with patients spouse and primary caregiver Inetta Dicke to assess patient needs . Informed by Mr Laton the home health physical therapist suggested a recliner for the patient to assist with seating while out of bed. Mr. Hampshire reports the patients hospital bed only adjusts height by hand crank at it is difficult to maneuver this at time . Collaboration with [place] regarding hospital bed options . Determined the patients spouse was unable to afford a recliner at this time .  Educated Mr Min on the limited resources to assist with purchasing a chair . Collaboration with [place] Coordinator with [facility] to determine if the patient would be eligible to receive a chair under their program . Obtained  permission from Mr Kupfer to send patient referral to [place] to help with resource identification   Patient Self Care Activities:  . Currently UNABLE TO independently perform ADL's or iADL's due to recent hip fracture  Initial goal documentation     . "She still has a Foley Catheter"     Husband stated  Current Barriers:  . High Risk for infection related to indwelling Foley catheter  Nurse Case Manager Clinical Goal(s):  [name] Over the next 14 days, husband/caregiver will be able to verbalize s/s of a UTI and know when to call the doctor if symptoms occur . Over the next 30 days, husband/caregiver will have a good understanding of how to care for patient's indwelling Foley catheter and will verbalize MD recommendations for plan and frequency to change out catheter . Over the next 90 days, patient will not experience hospital admission. Hospital Admissions in last 6 months = 4  CCM RN CM Interventions:  07/03/18 Completed call with Blaine Hamper, husband  . Evaluation of current treatment plan related to indwelling Foley catheter and patient's adherence to plan as established by provider. . Advised husband John to discuss plans to replace indwelling Foley catheter per MD recommendations with nurse Alma Downs from [facility]  . Provided education to patient re: importance of performing daily Foley catheter care to help avoid UTI . Reinforced importance of keeping patient well hydrated to help kidney's functioning well and help prevent UTI . Discussed plans with patient for ongoing care management follow up and provided patient with direct contact information for care management team  Patient Self Care Activities:  . Currently UNABLE TO independently perform self care  Initial goal documentation    . Assist patient/spouse with disease management and care coordination needs.      Current Barriers:   Ineffective Self Health Maintenance  Knowledge Deficits related to short term plan for  care coordination needs and long term plans for chronic disease management needs  Nurse Case Manager Clinical Goal(s):   Over the next 90 days, patient will work with care management team to address care coordination and chronic disease management needs related to Disease Management; Care Coordination; Medication Management and Education; Psychosocial Support; Caregiver Stress support; and Level of Care Concerns.     CCM RN CM Interventions:  Completed call with nurse Gertie Fey from [facility] on 06/19/18    Placed outbound call to nurse Gertie Fey [phone]; introduced myself and stated the purpose of my call to get a patient status update following her home visit from today; Fretta reports the patient's daughter was in the next room during her nurse visit, granddaughter and spouse were not in the home during her visit; she was able to adequately assess the patient's urine output due to the family is not recording output in log book  Discussed Gertie Fey has created a log sheet so the family can document the patient's output with each FC bag emptied; she has instructed the family accordingly  Discussed the patient has not had a BM since being home from the hospital despite adding Miralax  Discussed nurse Gertie Fey contacted Dr. Baird Cancer office to notify her and ask for recommendations  Discussed Gertie Fey has educated the family on importance of turning and deep breathing and to monitor  skin closely for skin breakdown  Completed outbound call to granddaughter Labria Wos, 06/19/18    Reiterated importance of pushing fluids and recording urine output exactly as measured for [facility] RN to best assess output and renal function  Discussed importance of adhering to providing Miralax as directed; discussed Dr. Baird Cancer may recommend an enema be given; granddaughter confirms patient has not had a bowel movement since being d/c home from the hospital   Completed outbound call to husband Karmela Bram,  06/19/18   Reiterated importance of pushing fluids and recording urine output exactly as measured for [facility] RN to best assess output and renal function  Discussed importance of adhering to providing Miralax as directed; Mr. Kuba confirms he emptied patient's urine this am and recorded the amount on the log sheet; Mr. Lamia states he received a call from Dr. Baird Cancer instructing him to pick up an enema kit from the pharmacy this afternoon; she further instructed Mr. Fenter to administer as directed  Confirmed Mr. Braud last day at work is this Saturday  Answered questions about the [facility] transportation option for patient  Discussed plans with patient for ongoing care management follow up and provided patient with direct contact information for care management team   Inbound call received/completed with granddaughter Tarnisha Kachmar, 06/19/18   Discussed patient had a large BM today while working with OT  Discussed patient's urine output a short moment ago was 600 ml and was clear yellow  Discussed patient was able to sit up on the side of her bed for about 1 minute several times and was able to ambulate a short distance with the use of her walker and with OT  Praised Apolonio Schneiders for pushing fluids and providing me with this update  Discussed plans with patient for ongoing care management follow up and provided patient with direct contact information for care management team  Patient Self Care Activities:   Currently UNABLE TO independently self manage needs related to chronic health conditions.   Please see past updates related to this goal by clicking on the "Past Updates" button in the selected goal        The patient verbalized understanding of instructions provided today and declined a print copy of patient instruction materials.   Follow up with provider re: DME status; air loss mattress/protein drink  [name], RN, BSN, CCM Care Management Coordinator [place]  Management/[facility]  Direct Phone: [phone]

## 2018-07-03 NOTE — Chronic Care Management (AMB) (Signed)
Chronic Care Management   Follow Up Note   07/03/2018 Name: [name] MRN: [id number] DOB: [birth year]  Referred by: Patient, No Pcp Per Reason for referral : Chronic Care Management (CCM RNCM Telephone Follow Up )   [name] is a 78 y.o. year old female who is a primary care patient of Patient, No Pcp Per. The CCM team was consulted for assistance with chronic disease management and care coordination needs.    Review of patient status, including review of consultants reports, relevant laboratory and other test results, and collaboration with appropriate care team members and the patient's provider was performed as part of comprehensive patient evaluation and provision of chronic care management services.    I spoke with Mr. [name] by telephone today.   Goals Addressed    . "I need help caring for my wife"       Spouse states  Current Barriers:  [name] Knowledge Deficits related to potential complications secondary to immobility  . Lacks caregiver support  . Chronic Disease Management support and education needs related to how to provide total care, obtain DME and utilize the appropriate resources available  Nurse Case Manager Clinical Goal(s):  [name] Over the next 30 days, patient/spouse will verbalize understanding of plan for obtaining DME.  . Over the next 60 days, patient/spouse will verbalize understanding of Palliative Care Services.  . Over the next 90 days, patient will work with the CCM team to improve knowledge and understanding about potential complications associated with patient's impaired physical mobility including complications related to skin breakdown, and pneumonia, malnutrition and DVT.   CCM RN Interventions:  07/03/18 Completed call with [name], husband  . Discussed questions/concerns related to patient's treatment plan and services that are in place - husband denies having questions and is very happy with the care and DME that are currently in place  for patient . Discussed and reinforced importance of skin breakdown prevention; reinforced importance of turning patient, shifting weight and using pillows for support, getting patient out of bed as directed by PT/OT and assisting patient with ROM and HEP as directed  . Discussed Dr. Baird Adams will send an order for a new air mattress that will help reduce pressure and help prevent further skin breakdown . Discussed adherence to having patient drink her protein supplements to help with protein and wound healing - husband reports patient is drinking 3 per day, prefers Boost and is drinking a breakfast protein shake . Discussed outcome of OV with Orthopedic surgeon completed on 07/02/18; spouse reports the doctor is pleased with how [name]'s hip is healing, spouse reports no change in the current treatment plan - [name] notified the Orthopedic surgeon, [name] is still c/o pain to her effected extremity with light weight bearing - MD reassured this to be expected until complete healing occurs . Discussed PTA's concerns that PT is moving slowly and Mrs. Morissette is not well engaged - [name] reports she has just started turning herself and he feels this will improve as she gets stronger . Discussed plans with patient for ongoing care management follow up and provided patient with direct contact information for care management team . Husband verbalizes understanding of the information/education provided today   Patient Self Care Activities:  . Currently UNABLE TO independently participate in [place]  Please see past updates related to this goal by clicking on the "Past Updates" button in the selected goal     . "She still has a  Foley Catheter"       Husband stated  Current Barriers:  . High Risk for infection related to indwelling Foley catheter  Nurse Case Manager Clinical Goal(s):  [name] Over the next 14 days, husband/caregiver will be able to verbalize s/s of a UTI and know when to call  the doctor if symptoms occur . Over the next 30 days, husband/caregiver will have a good understanding of how to care for patient's indwelling Foley catheter and will verbalize MD recommendations for plan and frequency to change out catheter . Over the next 90 days, patient will not experience hospital admission. Hospital Admissions in last 6 months = 4  CCM RN CM Interventions:  07/03/18 Completed call with [name], husband  . Evaluation of current treatment plan related to indwelling Foley catheter and patient's adherence to plan as established by provider. . Advised husband [name] to discuss plans to replace indwelling Foley catheter per MD recommendations with nurse Alma Downs from [facility]  . Provided education to patient re: importance of performing daily Foley catheter care to help avoid UTI . Reinforced importance of keeping patient well hydrated to help kidney's functioning well and help prevent UTI . Discussed plans with patient for ongoing care management follow up and provided patient with direct contact information for care management team  Patient Self Care Activities:  . Currently UNABLE TO independently perform self care  Initial goal documentation       Follow up with provider re: status of air loss mattress and protein drink   [name], RN, BSN, CCM Care Management Coordinator [place] [facility]  Direct Phone: [phone]

## 2018-07-04 ENCOUNTER — Ambulatory Visit: Payer: Self-pay

## 2018-07-04 ENCOUNTER — Other Ambulatory Visit: Payer: Medicare PPO | Admitting: Internal Medicine

## 2018-07-04 ENCOUNTER — Other Ambulatory Visit: Payer: Self-pay | Admitting: Rheumatology

## 2018-07-04 ENCOUNTER — Telehealth: Payer: Self-pay

## 2018-07-04 ENCOUNTER — Other Ambulatory Visit: Payer: Self-pay

## 2018-07-04 DIAGNOSIS — I5032 Chronic diastolic (congestive) heart failure: Secondary | ICD-10-CM

## 2018-07-04 DIAGNOSIS — I5031 Acute diastolic (congestive) heart failure: Secondary | ICD-10-CM | POA: Diagnosis not present

## 2018-07-04 DIAGNOSIS — M059 Rheumatoid arthritis with rheumatoid factor, unspecified: Secondary | ICD-10-CM

## 2018-07-04 DIAGNOSIS — M47894 Other spondylosis, thoracic region: Secondary | ICD-10-CM | POA: Diagnosis not present

## 2018-07-04 DIAGNOSIS — D631 Anemia in chronic kidney disease: Secondary | ICD-10-CM | POA: Diagnosis not present

## 2018-07-04 DIAGNOSIS — I130 Hypertensive heart and chronic kidney disease with heart failure and stage 1 through stage 4 chronic kidney disease, or unspecified chronic kidney disease: Secondary | ICD-10-CM | POA: Diagnosis not present

## 2018-07-04 DIAGNOSIS — I2510 Atherosclerotic heart disease of native coronary artery without angina pectoris: Secondary | ICD-10-CM | POA: Diagnosis not present

## 2018-07-04 DIAGNOSIS — M069 Rheumatoid arthritis, unspecified: Secondary | ICD-10-CM | POA: Diagnosis not present

## 2018-07-04 DIAGNOSIS — J9601 Acute respiratory failure with hypoxia: Secondary | ICD-10-CM | POA: Diagnosis not present

## 2018-07-04 DIAGNOSIS — G9341 Metabolic encephalopathy: Secondary | ICD-10-CM | POA: Diagnosis not present

## 2018-07-04 DIAGNOSIS — R5381 Other malaise: Secondary | ICD-10-CM

## 2018-07-04 DIAGNOSIS — N183 Chronic kidney disease, stage 3 (moderate): Secondary | ICD-10-CM | POA: Diagnosis not present

## 2018-07-04 NOTE — Patient Instructions (Signed)
Social Worker Visit Information  Goals we discussed today:  Goals    .         [name] "I think she has a UTI"     Husband Stated:  Current Barriers:  . Increased confusion "it's like she won't connect to stuff you say"  Clinical Social Work Clinical Goal(s):  [name] Over the next 5 days, patient will work with home health RN to complete an order for U/A Culture and Sensitivity.  Interventions: . Interviewed patients spouse to assess for patients increased confusion . Educated Mr. Crossland on signs and symptoms or a UTI including fever, increased confusion, urine odor, lethargy . Determined the patient continues to have good PO intake with reports of drinking 2 bottles of water per day, boost, and eating fruit. Mr. Sparkman does not suspect fever at this time . Informed Mr. Heuberger that CCM SW would contact the patients provider office requesting orders for home health RN to perform U/A at next home visit . Collaboration with patients care team including CCM RN Case Manager [name] and [facility] FNP Minette Brine  . Received communication from Mr. Laurance Flatten she is agreeable to place orders for the patient to have urine checked on behalf of the patients primary care provider (Dr. Baird Cancer) whom is out of the office in a training . Communicated with [place] who reports she will be in contact with home health RN updating on plan of care  Patient Self Care Activities:  . Currently UNABLE TO independently perform ADL's and iADL's  Initial goal documentation               Materials Provided: Verbal education about signs and symptoms of UTI provided by phone  Follow Up Plan: SW will follow up with patient by phone over the next week   [name], BSW, CDP Social Worker, Certified Dementia Practitioner [place] [facility] [place] [facility] [phone]

## 2018-07-04 NOTE — Chronic Care Management (AMB) (Addendum)
Chronic Care Management   Follow Up Note   07/04/2018 Name: [name] MRN: [id number] DOB: 06/08/1940  Referred by: Patient, No Pcp Per Reason for referral : Chronic Care Management (CCM RNCM Telephone Follow Up )   [name] is a 78 y.o. year old female who is a primary care patient of Patient, No Pcp Per. The CCM team was consulted for assistance with chronic disease management and care coordination needs.    Review of patient status, including review of consultants reports, relevant laboratory and other test results, and collaboration with appropriate care team members and the patient's provider was performed as part of comprehensive patient evaluation and provision of chronic care management services.   An outbound call was placed to [facility] today to follow up on new DME orders for an air loss mattress. Other multidisciplinary collaboration was completed.     Goals    . "I need help caring for my wife"     Spouse states  Current Barriers:  [name] Knowledge Deficits related to potential complications secondary to immobility  . Lacks caregiver support  . Chronic Disease Management support and education needs related to how to provide total care, obtain DME and utilize the appropriate resources available  Nurse Case Manager Clinical Goal(s):  [name] Over the next 30 days, patient/spouse will verbalize understanding of plan for obtaining DME.  . Over the next 60 days, patient/spouse will verbalize understanding of Palliative Care Services.  . Over the next 90 days, patient will work with the CCM team to improve knowledge and understanding about potential complications associated with patient's impaired physical mobility including complications related to skin breakdown, and pneumonia, malnutrition and DVT.   CCM RN Interventions:  07/04/18 Completed call with Pamala Hurry, CSR with [employer]  . Confirmed orders for the air loss mattress has been received . Discussed more  information is needed including length of time the mattress is needed and supporting documentation . Secure message forwarded to Dr. Baird Cancer with an update and to alert her to what the DME supplier is needing . Plan to follow up in 1 business day  Patient Self Care Activities:  . Currently UNABLE TO independently participate in [place]  Please see past updates related to this goal by clicking on the "Past Updates" button in the selected goal      . "I think she has a UTI"     Husband Stated:  Current Barriers:  . Increased confusion "it's like she won't connect to stuff you say"  Clinical Social Work Clinical Goal(s):  [name] Over the next 5 days, patient will work with home health RN to complete an order for U/A Culture and Sensitivity.  Interventions: . Interviewed patients spouse to assess for patients increased confusion . Educated Mr. Seelye on signs and symptoms or a UTI including fever, increased confusion, urine odor, lethargy . Determined the patient continues to have good PO intake with reports of drinking 2 bottles of water per day, boost, and eating fruit. Mr. Eiland does not suspect fever at this time . Informed Mr. Aslin that CCM SW would contact the patients provider office requesting orders for home health RN to perform U/A at next home visit . Collaboration with patients care team including CCM RN Case Manager [name] and [facility] FNP Minette Brine  . Received communication from Mr. Laurance Flatten she is agreeable to place orders for the patient to have urine checked on behalf of the patients primary care provider (  Dr. Baird Cancer) whom is out of the office in a training . Communicated with [place] who reports she will be in contact with home health RN updating on plan of care  Patient Self Care Activities:  . Currently UNABLE TO independently perform ADL's and iADL's  Initial goal documentation     . "She still has a Foley Catheter"      Husband stated  Current Barriers:  . High Risk for infection related to indwelling Foley catheter  Nurse Case Manager Clinical Goal(s):  [name] Over the next 14 days, husband/caregiver will be able to verbalize s/s of a UTI and know when to call the doctor if symptoms occur . Over the next 30 days, husband/caregiver will have a good understanding of how to care for patient's indwelling Foley catheter and will verbalize MD recommendations for plan and frequency to change out catheter . Over the next 90 days, patient will not experience hospital admission. Hospital Admissions in last 6 months = 4  CCM RN CM Interventions:  07/04/18 Multi-disciplinary collaboration   . Received an update from [place] stating Mr. Recktenwald reported concerns that Mrs. Seipp may have a UTI; he reports she is having more confusion than usual today . Provider Minette Brine FNP notified via secure message per [facility]; orders requested for [facility] to obtain urine sample for U/A and Urine C&S during next [facility] scheduled for tomorrow, 07/05/18 . Placed an outbound call to nurse Alma Downs with [facility] to provide an update regarding urine specimen; discussed last indwelling catheter change was completed during patient's last IP stay; discussed Alma Downs has not received orders concerning the indwelling Foley catheter; advised I will reach out to the Urology office to inquire about any orders needed for a foley cath change  . Placed outbound call to [facility], Dr. Karsten Ro [phone]; spoke with [name], Dr. Karsten Ro and his nurse are out of the office today; per OV notes completed by Dr. Karsten Ro on 06/24/18, a voiding test was attempted but failed due to patient c/o having pain and was unable to void; advised spouse reports s/s suggestive of possible UTI; advised PCP ordered a U/A and urine C&S to be obtained by the [facility] RN  Patient Self Care Activities:  . Currently UNABLE TO independently perform self care  Please  see past updates related to this goal by clicking on the "Past Updates" button in the selected goal       Follow up with provider re: approval for DME ([place])   [name], RN, BSN, CCM Care Management Coordinator [place] [facility]  Direct Phone: [phone]

## 2018-07-04 NOTE — Chronic Care Management (AMB) (Signed)
  Chronic Care Management    Social Work Follow Up Note  07/04/2018 Name: [name] MRN: [id number] DOB: 10-02-1940  [name] is a 78 y.o. year old female who is a primary care patient of Glendale Chard, MD. The CCM team was consulted for assistance with chronic care management.  Review of patient status, including review of consultants reports, other relevant assessments, and collaboration with appropriate care team members and the patient's provider was performed as part of comprehensive patient evaluation and provision of chronic care management services.     Goals    . "I think she has a UTI"     Husband Stated:  Current Barriers:  . Increased confusion "it's like she won't connect to stuff you say"  Clinical Social Work Clinical Goal(s):  [name] Over the next 5 days, patient will work with home health RN to complete an order for U/A Culture and Sensitivity.  Interventions: . Interviewed patients spouse to assess for patients increased confusion . Educated Mr. Darko on signs and symptoms or a UTI including fever, increased confusion, urine odor, lethargy . Determined the patient continues to have good PO intake with reports of drinking 2 bottles of water per day, boost, and eating fruit. Mr. Sefcik does not suspect fever at this time . Informed Mr. Ergle that CCM SW would contact the patients provider office requesting orders for home health RN to perform U/A at next home visit . Collaboration with patients care team including CCM RN Case Manager [name] and [facility] FNP Minette Brine  . Received communication from Mr. Laurance Flatten she is agreeable to place orders for the patient to have urine checked on behalf of the patients primary care provider (Dr. Baird Cancer) whom is out of the office in a training . Communicated with [place] who reports she will be in contact with home health RN updating on plan of care  Patient Self Care Activities:   . Currently UNABLE TO independently perform ADL's and iADL's  Initial goal documentation   .           Follow Up Plan: SW will follow up with patient by phone over the next week.   [name], BSW, CDP Social Worker, Certified Dementia Practitioner [facility] [phone]  Total time spent performing care coordination and/or care management activities with the patient by phone or face to face = 12 minutes.

## 2018-07-05 ENCOUNTER — Ambulatory Visit: Payer: Self-pay

## 2018-07-05 ENCOUNTER — Telehealth: Payer: Self-pay

## 2018-07-05 ENCOUNTER — Other Ambulatory Visit: Payer: Medicare PPO | Admitting: Internal Medicine

## 2018-07-05 ENCOUNTER — Other Ambulatory Visit: Payer: Self-pay

## 2018-07-05 DIAGNOSIS — I5032 Chronic diastolic (congestive) heart failure: Secondary | ICD-10-CM

## 2018-07-05 DIAGNOSIS — J9601 Acute respiratory failure with hypoxia: Secondary | ICD-10-CM | POA: Diagnosis not present

## 2018-07-05 DIAGNOSIS — I5031 Acute diastolic (congestive) heart failure: Secondary | ICD-10-CM | POA: Diagnosis not present

## 2018-07-05 DIAGNOSIS — I130 Hypertensive heart and chronic kidney disease with heart failure and stage 1 through stage 4 chronic kidney disease, or unspecified chronic kidney disease: Secondary | ICD-10-CM

## 2018-07-05 DIAGNOSIS — M47894 Other spondylosis, thoracic region: Secondary | ICD-10-CM | POA: Diagnosis not present

## 2018-07-05 DIAGNOSIS — M069 Rheumatoid arthritis, unspecified: Secondary | ICD-10-CM | POA: Diagnosis not present

## 2018-07-05 DIAGNOSIS — G9341 Metabolic encephalopathy: Secondary | ICD-10-CM | POA: Diagnosis not present

## 2018-07-05 DIAGNOSIS — I2510 Atherosclerotic heart disease of native coronary artery without angina pectoris: Secondary | ICD-10-CM | POA: Diagnosis not present

## 2018-07-05 DIAGNOSIS — R5381 Other malaise: Secondary | ICD-10-CM

## 2018-07-05 DIAGNOSIS — M059 Rheumatoid arthritis with rheumatoid factor, unspecified: Secondary | ICD-10-CM

## 2018-07-05 DIAGNOSIS — S72009D Fracture of unspecified part of neck of unspecified femur, subsequent encounter for closed fracture with routine healing: Secondary | ICD-10-CM

## 2018-07-05 DIAGNOSIS — N390 Urinary tract infection, site not specified: Secondary | ICD-10-CM | POA: Diagnosis not present

## 2018-07-05 DIAGNOSIS — D631 Anemia in chronic kidney disease: Secondary | ICD-10-CM | POA: Diagnosis not present

## 2018-07-05 DIAGNOSIS — N183 Chronic kidney disease, stage 3 (moderate): Secondary | ICD-10-CM | POA: Diagnosis not present

## 2018-07-05 NOTE — Chronic Care Management (AMB) (Signed)
Chronic Care Management   Follow Up Note   07/05/2018 Name: [name] MRN: [id number] DOB: 28-Oct-1940  Referred by: Patient, No Pcp Per Reason for referral : Chronic Care Management (CCM RNCM Telephone Follow Up)   [name] is a 78 y.o. year old female who is a primary care patient of Patient, No Pcp Per. The CCM team was consulted for assistance with chronic disease management and care coordination needs.    Review of patient status, including review of consultants reports, relevant laboratory and other test results, and collaboration with appropriate care team members and the patient's provider was performed as part of comprehensive patient evaluation and provision of chronic care management services.    Multidisciplinary collaboration was completed today regarding [name] clinical status and DME.   Visit Information  Goals Addressed    . "I need help caring for my wife"       Spouse states  Current Barriers:  [name] Knowledge Deficits related to potential complications secondary to immobility  . Lacks caregiver support  . Chronic Disease Management support and education needs related to how to provide total care, obtain DME and utilize the appropriate resources available  Nurse Case Manager Clinical Goal(s):  [name] Over the next 30 days, patient/spouse will verbalize understanding of plan for obtaining DME.  . Over the next 60 days, patient/spouse will verbalize understanding of Palliative Care Services.  . Over the next 90 days, patient will work with the CCM team to improve knowledge and understanding about potential complications associated with patient's impaired physical mobility including complications related to skin breakdown, and pneumonia, malnutrition and DVT.   CCM RN Interventions:  07/05/18 Received update from [name] via in [employer]  "I wanted to send you an update on [name]. Adams Lex, Palliative Care NP, was out  today to her. Patient has a deep tissue injury to her left heel that is painful, measuring 3x3 cm. [name] educated husband how to float her heels. [name] wanted me to reach out to see what home health is seeing her and if they can begin wound care. Also, [name] completed a DNR and MOST form while she was there today. The MOST form includes no feeding tubes, trial of IV, antibiotics on case by case. Thank you!"   Reply sent back to [facility] RN is aware, discussed the CCM team is working on getting a pressure reduction mattress from [place]; advised the nurse is monitoring for new or worsening bed sores and educating [name] on skin breakdown prevention  07/05/18 Completed call with [name], DME rep with [place] [facility]  . Reconfirmed the updated orders for the air loss mattress has been received . Discussed more information is needed including the supporting documentation for Stage 2 decubitus ulcerations, including size and measurements - [name] advised the [facility] RN can fax into [place] to fax # [phone] . Discussed turn around time once approved is usually same day or next; confirmed DME staff are available on the weekends  07/05/18 Placed outbound call to nurse [name] with [facility]  . Received update regarding patient's skin breakdown . [place] RN reports patient currently has 4 Stage 2 decubitus ulcers on her sacrum and buttocks; she agrees patient has a deep tissue injury to her right heel in which she has applied a "foam dressing" . Requested [name] fax the supporting documentation related to skin breakdown to [facility] fax # [phone] - [name] agreed she will contact  her agency and asked that this documentation be faxed ASAP . Sent in basket message to Dr. Baird Cancer requesting heel protector booties be ordered for best effort to release heel pressure . Updated [name] on the attempts being made to upgrade patient' mattress to an air loss pressure reducing mattress to  help with skin breakdown   . Patient Self Care Activities:  . Currently UNABLE TO independently participate in Self Care  Please see past updates related to this goal by clicking on the "Past Updates" button in the selected goal     . "She still has a Foley Catheter"       Husband stated  Current Barriers:  . High Risk for infection related to indwelling Foley catheter  Nurse Case Manager Clinical Goal(s):  [name] Over the next 14 days, husband/caregiver will be able to verbalize s/s of a UTI and know when to call the doctor if symptoms occur . Over the next 30 days, husband/caregiver will have a good understanding of how to care for patient's indwelling Foley catheter and will verbalize MD recommendations for plan and frequency to change out catheter . Over the next 90 days, patient will not experience hospital admission. Hospital Admissions in last 6 months = 4  CCM RN CM Interventions:  07/05/18 Completed call with nurse [name] from [place]  . [name] confirmed a urine sample has been obtained from [name] . Discussed [name] stated to the PTA that if urine is clear, is considering transitioning patient over to [facility] . Confirmed [name] will make a return visit to patient's home on Monday, 07/08/18  Patient Self Care Activities:  . Currently UNABLE TO independently perform self care  Please see past updates related to this goal by clicking on the "Past Updates" button in the selected goal       Follow up with provider re: update on DME (air loss pressure reducing mattress)  Becky Merino, RN, BSN, CCM Care Management Coordinator [facility] [facility]  Direct Phone: [phone]

## 2018-07-05 NOTE — Progress Notes (Signed)
    [facility] Consult Note Telephone: [phone]  Fax: [phone]  PATIENT NAME: NUBIA ZIESMER DOB: Aug 08, 1940 MRN: [id number]  NOTE:  Phoned patient at listed home telephone number for previously scheduled appointment and voicemail message was received.   No return call from spouse but I was able to contact him after a recurrent calls.  Appointment was rescheduled for 07/05/18 at 9am at their home.  Gonzella Lex, NP-C

## 2018-07-08 ENCOUNTER — Telehealth: Payer: Self-pay

## 2018-07-08 ENCOUNTER — Other Ambulatory Visit: Payer: Self-pay | Admitting: Internal Medicine

## 2018-07-08 ENCOUNTER — Other Ambulatory Visit: Payer: Self-pay

## 2018-07-08 ENCOUNTER — Ambulatory Visit: Payer: Self-pay

## 2018-07-08 DIAGNOSIS — I5032 Chronic diastolic (congestive) heart failure: Secondary | ICD-10-CM

## 2018-07-08 DIAGNOSIS — R5381 Other malaise: Secondary | ICD-10-CM

## 2018-07-08 DIAGNOSIS — S72009D Fracture of unspecified part of neck of unspecified femur, subsequent encounter for closed fracture with routine healing: Secondary | ICD-10-CM

## 2018-07-08 DIAGNOSIS — M059 Rheumatoid arthritis with rheumatoid factor, unspecified: Secondary | ICD-10-CM

## 2018-07-08 DIAGNOSIS — I130 Hypertensive heart and chronic kidney disease with heart failure and stage 1 through stage 4 chronic kidney disease, or unspecified chronic kidney disease: Secondary | ICD-10-CM

## 2018-07-08 MED ORDER — AMOXICILLIN 500 MG PO CAPS: 500.0000 mg | ORAL_CAPSULE | Freq: Every day | ORAL | 0 refills | Status: AC

## 2018-07-08 NOTE — Telephone Encounter (Signed)
pls contact pt - let her know pos for UTI. Rx for amoxicillin once daily was sent to the pharmacy. she should complete full abx course.   Mr. Delia notified and said that she already has an antibiotic that was sent to the pharmacy on Saturday. Mr. Wahlert was told that Dr. Baird Cancer said that she didn't know until today that an antibiotic was sent to the pharmacy for the pt and to keep her on what she has already started, make sure she finishes the med, and that an order will be put in for the pt to have her urine rechecked in 2 wks.

## 2018-07-09 ENCOUNTER — Ambulatory Visit: Payer: Self-pay

## 2018-07-09 DIAGNOSIS — M059 Rheumatoid arthritis with rheumatoid factor, unspecified: Secondary | ICD-10-CM | POA: Diagnosis not present

## 2018-07-09 DIAGNOSIS — J9601 Acute respiratory failure with hypoxia: Secondary | ICD-10-CM | POA: Diagnosis not present

## 2018-07-09 DIAGNOSIS — I5032 Chronic diastolic (congestive) heart failure: Secondary | ICD-10-CM

## 2018-07-09 DIAGNOSIS — R5381 Other malaise: Secondary | ICD-10-CM

## 2018-07-09 DIAGNOSIS — M069 Rheumatoid arthritis, unspecified: Secondary | ICD-10-CM | POA: Diagnosis not present

## 2018-07-09 DIAGNOSIS — I2510 Atherosclerotic heart disease of native coronary artery without angina pectoris: Secondary | ICD-10-CM | POA: Diagnosis not present

## 2018-07-09 DIAGNOSIS — I130 Hypertensive heart and chronic kidney disease with heart failure and stage 1 through stage 4 chronic kidney disease, or unspecified chronic kidney disease: Secondary | ICD-10-CM

## 2018-07-09 DIAGNOSIS — D631 Anemia in chronic kidney disease: Secondary | ICD-10-CM | POA: Diagnosis not present

## 2018-07-09 DIAGNOSIS — M47894 Other spondylosis, thoracic region: Secondary | ICD-10-CM | POA: Diagnosis not present

## 2018-07-09 DIAGNOSIS — L89152 Pressure ulcer of sacral region, stage 2: Secondary | ICD-10-CM | POA: Diagnosis not present

## 2018-07-09 DIAGNOSIS — G9341 Metabolic encephalopathy: Secondary | ICD-10-CM | POA: Diagnosis not present

## 2018-07-09 DIAGNOSIS — L89312 Pressure ulcer of right buttock, stage 2: Secondary | ICD-10-CM | POA: Diagnosis not present

## 2018-07-09 DIAGNOSIS — L89322 Pressure ulcer of left buttock, stage 2: Secondary | ICD-10-CM | POA: Diagnosis not present

## 2018-07-09 DIAGNOSIS — N183 Chronic kidney disease, stage 3 (moderate): Secondary | ICD-10-CM | POA: Diagnosis not present

## 2018-07-09 DIAGNOSIS — I5031 Acute diastolic (congestive) heart failure: Secondary | ICD-10-CM | POA: Diagnosis not present

## 2018-07-09 NOTE — Chronic Care Management (AMB) (Signed)
Chronic Care Management   Follow Up Note   07/09/2018 Name: [name] MRN: [id number] DOB: 1940-12-26  Referred by: Patient, No Pcp Per Reason for referral : Chronic Care Management (CCM RNCM Case Collaboration )   [name] is a 78 y.o. year old female who is a primary care patient of Patient, No Pcp Per. The CCM team was consulted for assistance with chronic disease management and care coordination needs.    Review of patient status, including review of consultants reports, relevant laboratory and other test results, and collaboration with appropriate care team members and the patient's provider was performed as part of comprehensive patient evaluation and provision of chronic care management services.    Multidisciplinary collaboration was completed today regarding [name] plan of care for transition to Hospice.   Goals Addressed    . "I need help caring for my wife"       Spouse states  Current Barriers:  [name] Knowledge Deficits related to potential complications secondary to immobility  . Lacks caregiver support  . Chronic Disease Management support and education needs related to how to provide total care, obtain DME and utilize the appropriate resources available  Nurse Case Manager Clinical Goal(s):  [name] Over the next 30 days, patient/spouse will verbalize understanding of plan for obtaining DME.  . Over the next 60 days, patient/spouse will verbalize understanding of Palliative Care Services.  . Over the next 90 days, patient will work with the CCM team to improve knowledge and understanding about potential complications associated with patient's impaired physical mobility including complications related to skin breakdown, and pneumonia, malnutrition and DVT.   CCM RN Interventions:  07/09/18 Case Collaboration with [place]   . Received an inbound call from [place] from [place] . Discussed [name] is very weak today during her SNV and was  having difficulty with transferring to her bedside commode with assistance x 2 . Discussed Alma Downs had End of Life Care conversation with [name] as he stated to Alma Downs that he doesn't want to his wife to suffer - per Alma Downs, [name] is requesting [name] be transitioned to Hospice . Discussed that I will contact Mr. Gaut to further discuss and evaluate for transition to Hospice . Discussed I will notify Dr.Sanders and [facility] accordingly  07/09/18 Placed outbound call to Mr. Kimery  . Placed outbound call to Mr. Matherly to confirm request for transition to [facility] - left a vm requesting a return call when possible  . Received inbound call from Mr. Geigle - discussed patient's current declining health status - discussed Mr. Ruybal request to transition patient to [facility]  . Educated Mr. Kozlov on the process of getting Hospice started and discussed that home health services will be discontinued once [name] is admitted into Hospice Care . Discussed the CCM will d/c [name] from [facility] but will remain available as a contact for Mr. Cadmus if needed in the future  07/09/18 Placed outbound call to [facility]  [phone]  . Left a voice message for Palliative Care Clinical Navigator [name] - advised Mr. Biancardi is requesting to transition [name] over to [facility]  . Advised the [facility] RN has confirmed she feels the patient is Hospice appropriate and is at End of Life . Advised a message has been sent to PCP Dr. Glendale Chard requesting an order for Hospice Care - added that it may be the end of the day before  Dr. Baird Cancer is able to respond to this request . Provided my contact # and encouraged a call back tomorrow to f/u on the status of the request  Patient Self Care Activities:  . Currently UNABLE TO independently participate in [place]  Please see past updates related to this goal by clicking on the "Past Updates" button in the selected goal          Follow up with provider re: transition to [facility], RN, BSN, CCM Care Management Coordinator [facility]  Direct Phone: [phone]

## 2018-07-09 NOTE — Chronic Care Management (AMB) (Signed)
Chronic Care Management   Follow Up Note   07/08/2018 Name: [name] MRN: [id number] DOB: 1940-03-14  Referred by: Patient, No Pcp Per Reason for referral : Chronic Care Management (CCM RNCM Case Collaboration )   [name] is a 78 y.o. year old female who is a primary care patient of Patient, No Pcp Per. The CCM team was consulted for assistance with chronic disease management and care coordination needs.    Review of patient status, including review of consultants reports, relevant laboratory and other test results, and collaboration with appropriate care team members and the patient's provider was performed as part of comprehensive patient evaluation and provision of chronic care management services.   Multidisciplinary collaboration was completed today regarding [name] DME needs and positive UA with C/S.   Goals Addressed    . "I need help caring for my wife"       Spouse states  Current Barriers:  [name] Kitchen Knowledge Deficits related to potential complications secondary to immobility  . Lacks caregiver support  . Chronic Disease Management support and education needs related to how to provide total care, obtain DME and utilize the appropriate resources available  Nurse Case Manager Clinical Goal(s):  [name] Kitchen Over the next 30 days, patient/spouse will verbalize understanding of plan for obtaining DME.  . Over the next 60 days, patient/spouse will verbalize understanding of Palliative Care Services.  . Over the next 90 days, patient will work with the CCM team to improve knowledge and understanding about potential complications associated with patient's impaired physical mobility including complications related to skin breakdown, and pneumonia, malnutrition and DVT.   CCM RN Interventions:  . 07/08/18 Case Collaboration with [place]   . Placed outbound call to nurse [name] to f/u on vm . Discussed patient now has 4 stage 2 decubitus ulcerations to her buttocks and  sacrum . Discussed noted deep tissue injury to right heel and nurse applied a foam dressing . Updated [name] on low air loss mattress will be delivered tomorrow afternoon and has been coordinated with [name] . Discussed requested orders for heel protectors from Dr. Baird Cancer via [place]  . [name] will continue to provide updates on any change in patient's condition  07/08/18 Completed call with [name], DME rep with [facility]  . Confirmed the low air loss mattress was approved and coordination for delivery has been arranged with [name] for the mattress to be delivered tomorrow afternoon  07/08/18 Placed outbound call to [name]  . Confirmed with [name], that he is expecting the air loss mattress to be delivered tomorrow afternoon by [place] - [name] verbalizes understanding  . Instructed [name] to continue to shift patient' weight every 2 hours using pillows for support to relieve pressure especially on bony prominences . Discussed importance of keeping patient well hydrated and importance of patient taking in adequate amount of protein to help with wound healing . Instructed [name] to report any change in skin condition promptly to [facility] RN, myself and or Dr. Baird Cancer . Discussed plans with patient for ongoing care management follow up and provided patient with direct contact information for care management team  . Patient Self Care Activities:  . Currently UNABLE TO independently participate in [place]  Please see past updates related to this goal by clicking on the "Past Updates" button in the selected goal      . "She still has a Foley Catheter"  Husband stated  Current Barriers:  . High Risk for infection related to indwelling Foley catheter  Nurse Case Manager Clinical Goal(s):  [name] Kitchen Over the next 14 days, husband/caregiver will be able to verbalize s/s of a UTI and know when to call the doctor if symptoms occur . Over the next 30 days,  husband/caregiver will have a good understanding of how to care for patient's indwelling Foley catheter and will verbalize MD recommendations for plan and frequency to change out catheter . Over the next 90 days, patient will not experience hospital admission. Hospital Admissions in last 6 months = 4  CCM RN CM Interventions:  07/08/18 Completed call with nurse [name] from [place]  . Spoke with [facility] RN [name] - confirmed patient's urine is positive for UTI with specified organism identified as Ecoli; she contacted the provider on call to notify and request Rx be faxed in for treatment of UTI  07/08/18 Completed call with Mr. Oehler . Confirmed Mr. Tavares is administering patient's antibiotic exactly as directed for UTI  . Confirmed the prescribed antibiotic is Cephalexin 250 mg tid x 7 days . Instructed on importance to give the antibiotic exactly as prescribed and to complete the full course of treatment  . Reinforced importance of keeping patient well hydrated and to report any new or worsening symptoms promptly, such as fever of 100.4 or greater, increased confusion, lethargy, decreased urine output, nausea/vomiting or decrease in fluid intake due to patient unable or refuses . Discussed plans with patient for ongoing care management follow up and provided patient with direct contact information for care management team . Collaboration with Dr. Baird Cancer regarding patient's positive urine and noted the prescribed antibiotic by on call physician  . Updated medication profile with newly prescribed antibiotic   Patient Self Care Activities:  . Currently UNABLE TO independently perform self care  Please see past updates related to this goal by clicking on the "Past Updates" button in the selected goal          The care management team will reach out to the patient again over the next 2-3 days.    [name], RN, BSN, CCM Care Management Coordinator [facility]  Direct Phone: [phone]

## 2018-07-09 NOTE — Patient Instructions (Signed)
Visit Information  Goals Addressed    . "I need help caring for my wife"       Spouse states  Current Barriers:  [name] Knowledge Deficits related to potential complications secondary to immobility  . Lacks caregiver support  . Chronic Disease Management support and education needs related to how to provide total care, obtain DME and utilize the appropriate resources available  Nurse Case Manager Clinical Goal(s):  [name] Over the next 30 days, patient/spouse will verbalize understanding of plan for obtaining DME.  . Over the next 60 days, patient/spouse will verbalize understanding of Palliative Care Services.  . Over the next 90 days, patient will work with the CCM team to improve knowledge and understanding about potential complications associated with patient's impaired physical mobility including complications related to skin breakdown, and pneumonia, malnutrition and DVT.   CCM RN Interventions:  . 07/08/18 Case Collaboration with [place]   . Placed outbound call to nurse Shradda to f/u on vm . Discussed patient now has 4 stage 2 decubitus ulcerations to her buttocks and sacrum . Discussed noted deep tissue injury to right heel and nurse applied a foam dressing . Updated Shradda on low air loss mattress will be delivered tomorrow afternoon and has been coordinated with Mr. Dragan . Discussed requested orders for heel protectors from Dr. Baird Cancer via [place]  . Alma Downs will continue to provide updates on any change in patient's condition  07/08/18 Completed call with Chastity, DME rep with [place]  . Confirmed the low air loss mattress was approved and coordination for delivery has been arranged with Mr. Coulson for the mattress to be delivered tomorrow afternoon  07/08/18 Placed outbound call to Mr. Mattox  . Confirmed with Mr. Godown, that he is expecting the air loss mattress to be delivered tomorrow afternoon by [place] - Mr. Dai verbalizes understanding   . Instructed Mr. Ricke to continue to shift patient' weight every 2 hours using pillows for support to relieve pressure especially on bony prominences . Discussed importance of keeping patient well hydrated and importance of patient taking in adequate amount of protein to help with wound healing . Instructed Mr. Jiron to report any change in skin condition promptly to [facility] RN, myself and or Dr. Baird Cancer . Discussed plans with patient for ongoing care management follow up and provided patient with direct contact information for care management team  . Patient Self Care Activities:  . Currently UNABLE TO independently participate in Self Care  Please see past updates related to this goal by clicking on the "Past Updates" button in the selected goal      . "She still has a Foley Catheter"       Husband stated  Current Barriers:  . High Risk for infection related to indwelling Foley catheter  Nurse Case Manager Clinical Goal(s):  [name] Over the next 14 days, husband/caregiver will be able to verbalize s/s of a UTI and know when to call the doctor if symptoms occur . Over the next 30 days, husband/caregiver will have a good understanding of how to care for patient's indwelling Foley catheter and will verbalize MD recommendations for plan and frequency to change out catheter . Over the next 90 days, patient will not experience hospital admission. Hospital Admissions in last 6 months = 4  CCM RN CM Interventions:  07/08/18 Completed call with nurse Alma Downs from [place]  . Spoke with [place] - confirmed patient's urine is positive for UTI with specified  organism identified as Ecoli; she contacted the provider on call to notify and request Rx be faxed in for treatment of UTI  07/08/18 Completed call with Mr. Guderian . Confirmed Mr. Monger is administering patient's antibiotic exactly as directed for UTI  . Confirmed the prescribed antibiotic is Cephalexin 250 mg tid x 7 days . Instructed on  importance to give the antibiotic exactly as prescribed and to complete the full course of treatment  . Reinforced importance of keeping patient well hydrated and to report any new or worsening symptoms promptly, such as fever of 100.4 or greater, increased confusion, lethargy, decreased urine output, nausea/vomiting or decrease in fluid intake due to patient unable or refuses . Discussed plans with patient for ongoing care management follow up and provided patient with direct contact information for care management team . Collaboration with Dr. Baird Cancer regarding patient's positive urine and noted the prescribed antibiotic by on call physician  . Updated medication profile with newly prescribed antibiotic   Patient Self Care Activities:  . Currently UNABLE TO independently perform self care  Please see past updates related to this goal by clicking on the "Past Updates" button in the selected goal         The care management team will reach out to the patient again over the next 2-3 days.   [name], RN, BSN, CCM Care Management Coordinator [place] [facility]  Direct Phone: [phone]

## 2018-07-09 NOTE — Patient Instructions (Signed)
Visit Information  Goals Addressed    . "I need help caring for my wife"       Spouse states  Current Barriers:  [name] Knowledge Deficits related to potential complications secondary to immobility  . Lacks caregiver support  . Chronic Disease Management support and education needs related to how to provide total care, obtain DME and utilize the appropriate resources available  Nurse Case Manager Clinical Goal(s):  [name] Over the next 30 days, patient/spouse will verbalize understanding of plan for obtaining DME.  . Over the next 60 days, patient/spouse will verbalize understanding of Palliative Care Services.  . Over the next 90 days, patient will work with the CCM team to improve knowledge and understanding about potential complications associated with patient's impaired physical mobility including complications related to skin breakdown, and pneumonia, malnutrition and DVT.   CCM RN Interventions:  07/09/18 Case Collaboration with [place]   . Received an inbound call from [place] from [name] . Discussed [name] is very weak today during her SNV and was having difficulty with transferring to her bedside commode with assistance x 2 . Discussed [name] had End of Life Care conversation with [name] as he stated to [name] that he doesn't want to his wife to suffer - per [name], [name] is requesting [name] be transitioned to Hospice . Discussed that I will contact [name] to further discuss and evaluate for transition to Hospice . Discussed I will notify [name] and [facility] accordingly  07/09/18 Placed outbound call to [name]  . Placed outbound call to [name] to confirm request for transition to [place] - left a vm requesting a return call when possible  . Received inbound call from [name] - discussed patient's current declining health status - discussed [name] request to transition patient to [place]  . Educated [name]  on the process of getting Hospice started and discussed that home health services will be discontinued once [name] is admitted into Hospice Care . Discussed the CCM will d/c [name] from [facility] but will remain available as a contact for [name] if needed in the future  07/09/18 Placed outbound call to [facility]  [phone]  . Left a voice message for Palliative Care Clinical Navigator [name] - advised [name] is requesting to transition [name] over to [facility]  . Advised the [facility] RN has confirmed she feels the patient is Hospice appropriate and is at End of Life . Advised a message has been sent to PCP [name] requesting an order for Hospice Care - added that it may be the end of the day before [name]. Baird Cancer is able to respond to this request . Provided my contact # and encouraged a call back tomorrow to f/u on the status of the request  Patient Self Care Activities:  . Currently UNABLE TO independently participate in [place]  Please see past updates related to this goal by clicking on the "Past Updates" button in the selected goal        The patient verbalized understanding of instructions provided today and declined a print copy of patient instruction materials.   Follow up with provider re: transition to [place], RN, BSN, CCM Care Management Coordinator [place] [facility]  Direct Phone: [phone]

## 2018-07-10 ENCOUNTER — Telehealth: Payer: Self-pay

## 2018-07-10 NOTE — Progress Notes (Unsigned)
[place] Consult Note Telephone: [phone]  Fax: [phone]  PATIENT NAME: [name] DOB: 1940-09-26 MRN: [id number]  PRIMARY CARE PROVIDER:   Patient, No Pcp Per  REFERRING PROVIDER: Dr. [place] Chard  RESPONSIBLE PARTY:   Mr. [name] Hemler(husband)   RECOMMENDATIONS and PLAN:  1.  I spent *** minutes providing this consultation,  from *** to ***. More than 50% of the time in this consultation was spent coordinating communication.   HISTORY OF PRESENT ILLNESS:  [name] is a 78 y.o. year old female with multiple medical problems including ***. Palliative Care was asked to help address goals of care.   CODE STATUS:   PPS: 0% HOSPICE ELIGIBILITY/DIAGNOSIS: TBD  PAST MEDICAL HISTORY:  Past Medical History:  Diagnosis Date  . Acute kidney injury superimposed on chronic kidney disease ([place]) 02/06/2015  . Anxiety   . AVM (arteriovenous malformation)   . CAD (coronary artery disease) 02/28/2018   a. 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  . Chronic diastolic CHF (congestive heart failure) ([place])   . Depression   . DVT (deep venous thrombosis) (H[facility]) 1970s   LLE  . GI bleed 02/2018   a. 02/2018 - ABL anemia/GIB - > endoscopy with gastric AVMs s/p clipping.  [name] Heart murmur   . Hypercholesterolemia   . Hypertension   . Hypertrophic cardiomyopathy ([place])    a. severe basal septal hypertrophy by echo 02/2018 with dynamic mid cavity gradient at rest.  . Kidney stones   . Migraine    "a few/year" (07/16/2015)  . Moderate mitral regurgitation   . Multiple falls   . Muscular deconditioning   . Pneumonia "several times"  . Rheumatoid arthritis(714.0)    "all over" (07/16/2015)  . Seizure [facility])    "last one was 1//2017" (07/16/2015)  . Syncope and collapse   . Vision abnormalities     SOCIAL HX:  Social History   Tobacco Use  . Smoking status: Former Smoker    Packs/day: 1.00    Years: 62.00    Pack years:  62.00    Types: Cigarettes    Quit date: 01/2018    Years since quitting: 0.4  . Smokeless tobacco: Never Used  Substance Use Topics  . Alcohol use: No    ALLERGIES: No Known Allergies   PERTINENT MEDICATIONS:  Outpatient Encounter Medications as of 07/05/2018  Medication Sig  . atorvastatin (LIPITOR) 80 MG tablet Take 1 tablet (80 mg total) by mouth daily at 6 PM.  . carvedilol (COREG) 12.5 MG tablet Take 2 tablets (25 mg total) by mouth 2 (two) times daily with a meal.  . Cholecalciferol (VITAMIN D) 125 MCG (5000 UT) CAPS Take 1 capsule by mouth daily.  . hydroxychloroquine (PLAQUENIL) 200 MG tablet TAKE 1 TABLET BY MOUTH EVERY DAY  . lactose free nutrition (BOOST PLUS) LIQD Take 237 mLs by mouth daily.  [name] levETIRAcetam (KEPPRA) 500 MG tablet TAKE 2 TABLETS BY MOUTH EVERY DAY  . mirtazapine (REMERON) 15 MG tablet Take 1 tablet (15 mg total) by mouth at bedtime for 30 days.  . pantoprazole (PROTONIX) 40 MG tablet Take 1 tablet (40 mg total) by mouth daily.  . ticagrelor (BRILINTA) 90 MG TABS tablet Take 1 tablet (90 mg total) by mouth 2 (two) times daily.   No facility-administered encounter medications on file as of 07/05/2018.     PHYSICAL EXAM:   General: NAD, frail appearing, thin Cardiovascular: regular rate and  rhythm Pulmonary: clear ant fields Abdomen: soft, nontender, + bowel sounds GU: no suprapubic tenderness Extremities: no edema, no joint deformities Skin: no rashes Neurological: Weakness but otherwise nonfocal  Gonzella Lex, NP

## 2018-07-11 ENCOUNTER — Ambulatory Visit: Payer: Self-pay

## 2018-07-11 DIAGNOSIS — M059 Rheumatoid arthritis with rheumatoid factor, unspecified: Secondary | ICD-10-CM | POA: Diagnosis not present

## 2018-07-11 DIAGNOSIS — I5032 Chronic diastolic (congestive) heart failure: Secondary | ICD-10-CM

## 2018-07-11 DIAGNOSIS — R5381 Other malaise: Secondary | ICD-10-CM

## 2018-07-11 DIAGNOSIS — I130 Hypertensive heart and chronic kidney disease with heart failure and stage 1 through stage 4 chronic kidney disease, or unspecified chronic kidney disease: Secondary | ICD-10-CM | POA: Diagnosis not present

## 2018-07-11 NOTE — Chronic Care Management (AMB) (Signed)
  Chronic Care Management   Follow Up Note   07/11/2018 Name: JHANIA ETHERINGTON MRN: [id number] DOB: 1940-01-18  Referred by: Patient, No Pcp Per Reason for referral : Chronic Care Management (CCM RNCM Telephone Follow Up  )   INDA MCGLOTHEN is a 78 y.o. year old female who is a primary care patient of Patient, No Pcp Per. The CCM team was consulted for assistance with chronic disease management and care coordination needs.    Review of patient status, including review of consultants reports, relevant laboratory and other test results, and collaboration with appropriate care team members and the patient's provider was performed as part of comprehensive patient evaluation and provision of chronic care management services.    Multidisciplinary collaboration attempts completed by telephone to f/u on patient's Hospice status.   Goals Addressed    . "I need help caring for my wife"       Spouse states  Current Barriers:  [name] Knowledge Deficits related to potential complications secondary to immobility  . Lacks caregiver support  . Chronic Disease Management support and education needs related to how to provide total care, obtain DME and utilize the appropriate resources available  Nurse Case Manager Clinical Goal(s):  [name] Over the next 30 days, patient/spouse will verbalize understanding of plan for obtaining DME.  . Over the next 60 days, patient/spouse will verbalize understanding of Palliative Care Services.  . Over the next 90 days, patient will work with the CCM team to improve knowledge and understanding about potential complications associated with patient's impaired physical mobility including complications related to skin breakdown, and pneumonia, malnutrition and DVT.   CCM RN Interventions:  07/11/18 Case Collaboration with [place]   . Received an inbound call from [place] from [place] . Discussed status of Hospice referral; advised Dr. Baird Cancer indicated she was  sending the referral late Tuesday . Discussed I will f/u with [facility] to check the status and give her a call back to advise . Placed outbound call to nurse Alma Downs with [place] - advised I have not been able to confirm patient's admission into Hospice - discussed Alma Downs will move her SNV out to tomorrow afternoon and I will f/u with her tomorrow am once I am able to confirm [place] has been initiated  07/11/18 Placed outbound call to [employer]   . Placed outbound to [place] [employer] ,[phone], left a voice message stating my name, contact number and the purpose of my call requesting a return call asap . Sent message to clinical navigator [name] - requested an update regarding patient's Hospice status  Patient Self Care Activities:  . Currently UNABLE TO independently participate in [place]  Please see past updates related to this goal by clicking on the "Past Updates" button in the selected goal         Telephone follow up appointment with care management team member scheduled for: Follow up with provider re: Hospice referral  07/12/18   [name], RN, BSN, CCM Care Management Coordinator [place] [facility]  Direct Phone: [phone]

## 2018-07-12 ENCOUNTER — Telehealth: Payer: Self-pay

## 2018-07-12 ENCOUNTER — Ambulatory Visit: Payer: Self-pay

## 2018-07-12 ENCOUNTER — Other Ambulatory Visit: Payer: Self-pay

## 2018-07-12 DIAGNOSIS — I509 Heart failure, unspecified: Secondary | ICD-10-CM | POA: Diagnosis not present

## 2018-07-12 DIAGNOSIS — S72009D Fracture of unspecified part of neck of unspecified femur, subsequent encounter for closed fracture with routine healing: Secondary | ICD-10-CM

## 2018-07-12 DIAGNOSIS — M059 Rheumatoid arthritis with rheumatoid factor, unspecified: Secondary | ICD-10-CM | POA: Diagnosis not present

## 2018-07-12 DIAGNOSIS — L89626 Pressure-induced deep tissue damage of left heel: Secondary | ICD-10-CM

## 2018-07-12 DIAGNOSIS — I5032 Chronic diastolic (congestive) heart failure: Secondary | ICD-10-CM | POA: Diagnosis not present

## 2018-07-12 DIAGNOSIS — N189 Chronic kidney disease, unspecified: Secondary | ICD-10-CM | POA: Diagnosis not present

## 2018-07-12 DIAGNOSIS — Z7401 Bed confinement status: Secondary | ICD-10-CM

## 2018-07-12 DIAGNOSIS — M069 Rheumatoid arthritis, unspecified: Secondary | ICD-10-CM | POA: Diagnosis not present

## 2018-07-12 DIAGNOSIS — E46 Unspecified protein-calorie malnutrition: Secondary | ICD-10-CM | POA: Diagnosis not present

## 2018-07-12 DIAGNOSIS — L89152 Pressure ulcer of sacral region, stage 2: Secondary | ICD-10-CM

## 2018-07-12 DIAGNOSIS — K219 Gastro-esophageal reflux disease without esophagitis: Secondary | ICD-10-CM

## 2018-07-12 DIAGNOSIS — I130 Hypertensive heart and chronic kidney disease with heart failure and stage 1 through stage 4 chronic kidney disease, or unspecified chronic kidney disease: Secondary | ICD-10-CM | POA: Diagnosis not present

## 2018-07-12 DIAGNOSIS — I252 Old myocardial infarction: Secondary | ICD-10-CM | POA: Diagnosis not present

## 2018-07-12 DIAGNOSIS — Z741 Need for assistance with personal care: Secondary | ICD-10-CM

## 2018-07-12 DIAGNOSIS — R569 Unspecified convulsions: Secondary | ICD-10-CM | POA: Diagnosis not present

## 2018-07-12 DIAGNOSIS — R5381 Other malaise: Secondary | ICD-10-CM

## 2018-07-12 DIAGNOSIS — I2510 Atherosclerotic heart disease of native coronary artery without angina pectoris: Secondary | ICD-10-CM | POA: Diagnosis not present

## 2018-07-12 DIAGNOSIS — S72002D Fracture of unspecified part of neck of left femur, subsequent encounter for closed fracture with routine healing: Secondary | ICD-10-CM | POA: Diagnosis not present

## 2018-07-12 NOTE — Chronic Care Management (AMB) (Signed)
  Chronic Care Management   Follow Up Note   07/12/2018 Name: [name] MRN: [id number] DOB: January 16, 1941  Referred by: Patient, No Pcp Per Reason for referral : Chronic Care Management (CCM RNCM Telephone Follow Up )   [name] is a 78 y.o. year old female who is a primary care patient of Patient, No Pcp Per. The CCM team was consulted for assistance with chronic disease management and care coordination needs.    Review of patient status, including review of consultants reports, relevant laboratory and other test results, and collaboration with appropriate care team members and the patient's provider was performed as part of comprehensive patient evaluation and provision of chronic care management services.    Multidisciplinary collaboration completed regarding patient's referral for Hospice Care.   Goals Addressed    . "I need help caring for my wife"       Spouse states  Current Barriers:  [name] Knowledge Deficits related to potential complications secondary to immobility  . Lacks caregiver support  . Chronic Disease Management support and education needs related to how to provide total care, obtain DME and utilize the appropriate resources available  Nurse Case Manager Clinical Goal(s):  [name] Over the next 30 days, patient/spouse will verbalize understanding of plan for obtaining DME.  . Over the next 60 days, patient/spouse will verbalize understanding of Palliative Care Services.  . Over the next 90 days, patient will work with the CCM team to improve knowledge and understanding about potential complications associated with patient's impaired physical mobility including complications related to skin breakdown, and pneumonia, malnutrition and DVT.   CCM RN Interventions:  07/12/18 Collaboration with [facility]   . Placed an outbound call to [facility], left a voice message for the intake department requesting a return call to confirm the date/time Mrs. Lemme will receive  her Hospice intake visit  . Sent an in basket message to clinical navigator [name] RN, requested verification orders for Hospice have been received and the initial Hospice visit scheduled . Received inbound call from Amy from [place], she confirmed Mrs. Bryngelson is scheduled to have a Hospice Intake visit this afternoon at 2:30 PM   07/12/18 Case Collaboration with [place]   . Placed outbound call to [place] with [place] - informed nurse Alma Downs I received confirmation from [facility] that a Hospice intake visit is scheduled for today at 2:30 PM  07/12/18 Outbound call attempted to Mr. Grudzien  . Placed an outbound call to Mr. Koppel, a HIPAA compliant voice message was left; provided my contact number and encouraged a call back if needed  Patient Self Care Activities:  . Currently UNABLE TO independently participate in [place]  Please see past updates related to this goal by clicking on the "Past Updates" button in the selected goal         Telephone follow up appointment with care management team member scheduled for: 07/17/18   [name], RN, BSN, CCM Care Management Coordinator [place] [facility]  Direct Phone: [phone]

## 2018-07-15 ENCOUNTER — Ambulatory Visit: Payer: Self-pay

## 2018-07-15 DIAGNOSIS — R5381 Other malaise: Secondary | ICD-10-CM

## 2018-07-15 DIAGNOSIS — R413 Other amnesia: Secondary | ICD-10-CM

## 2018-07-15 DIAGNOSIS — M059 Rheumatoid arthritis with rheumatoid factor, unspecified: Secondary | ICD-10-CM

## 2018-07-15 DIAGNOSIS — I5032 Chronic diastolic (congestive) heart failure: Secondary | ICD-10-CM

## 2018-07-15 DIAGNOSIS — I130 Hypertensive heart and chronic kidney disease with heart failure and stage 1 through stage 4 chronic kidney disease, or unspecified chronic kidney disease: Secondary | ICD-10-CM

## 2018-07-15 NOTE — Chronic Care Management (AMB) (Signed)
Chronic Care Adams    Clinical Social Work Follow Up Note  07/15/2018 Name: [name] MRN: [id number] DOB: 05/15/1940  [name] is a 78 y.o. year old female who is a primary care patient of Becky Chard, MD. The CCM team was consulted for assistance with Hospice/Palliative Care Services Education.   Review of patient status, including review of consultants reports, other relevant assessments, and collaboration with appropriate care team members and the patient's provider was performed as part of comprehensive patient evaluation and provision of chronic care Adams services.     Goals Addressed            This Visit's Progress   . "I need help caring for my wife"       Spouse states  Current Barriers:  [name] Knowledge Deficits related to potential complications secondary to immobility  . Lacks caregiver support  . Chronic Disease Adams support and education needs related to how to provide total care, obtain DME and utilize the appropriate resources available  Nurse Case Manager Clinical Goal(s):  [name] Over the next 30 days, patient/spouse will verbalize understanding of plan for obtaining DME.  . Over the next 60 days, patient/spouse will verbalize understanding of Palliative Care Services.  . Over the next 90 days, patient will work with the CCM team to improve knowledge and understanding about potential complications associated with patient's impaired physical mobility including complications related to skin breakdown, and pneumonia, malnutrition and DVT.   CCM RN Interventions:  07/15/18 Internal collaboration with embedded BSW [employer]  . Received an update regarding patient's Hospice Care home visit was completed by nurse [name] . Discussed [name] does not know next steps for patient as far as next planned visit with Hospice RN and or CNA . Discussed [name] will contact Hospice nurse [name] for collaborating regarding next steps .  Plan to contact [name] this Adams to f/u   CCM SW Interventions Completed 07/15/18  Outbound call to the patients spouse to assess progression of patient goal  Confirmed the patient did complete intake into Hospice Program  Assessed for patients plan of care - spouse unable to verbalize   Reminded [name] to call the Hospice RN anytime of day if he has a concern regarding [name] - [name] verbalized understanding   Advised [name] he would receive a call from [facility] RN Case Manager later in the Adams but to contact CCM team if assistance is needed prior to the call  Outbound call to [name] - confirmed the patient was admitted on 6/26  Voice message left for [name] who will be the patients primary RN requesting information on the number of times the patient will be seen each Adams, wound care orders, as well as if the patient will be granted a bath aide  Collaboration with CCM RN Case Manager regarding above interventions  . Voice message received from [facility] RN Case Manager [name] who reports she has yet to establish a home visit date but was hoping to see the patient either today (6/29) or Wednesday (7/1) Reports difficulty contacting the patient . Informed via voice message plans to have RN visit the patient minimum 2 x weekly and aide to visit the patient 3 x weekly. Reports once initial home visit is accomplished this plan can be finalized . Outbound call to [facility] RN Case Manager [name] on cell number provided [phone])  . Voice message left updating on contact number to  the patients spouse [name] Patient Self Care Activities:  . Currently UNABLE TO independently participate in [place]  Please see past updates related to this goal by clicking on the "Past Updates" button in the selected goal           Follow Up Plan: CCM SW will continue to follow until confirmation of the patients Hospice plan of care.   [name], BSW, CDP Social Worker, Certified Dementia Practitioner [name] [phone]  Total time spent performing care coordination and/or care Adams activities with the patient by phone or face to face = 10 minutes.

## 2018-07-15 NOTE — Chronic Care Management (AMB) (Addendum)
Chronic Care Management    Social Work Follow Up Note  07/15/2018 Name: [name] MRN: [id number] DOB: 29-Apr-1940  [name] is a 78 y.o. year old female who is a primary care patient of Glendale Chard, MD. The CCM team was consulted for assistance with Hospice/Palliative Care Services Education.   Review of patient status, including review of consultants reports, other relevant assessments, and collaboration with appropriate care team members and the patient's provider was performed as part of comprehensive patient evaluation and provision of chronic care management services.     Goals Addressed            This Visit's Progress   . "I need help caring for my wife"       Spouse states  Current Barriers:  [name] Knowledge Deficits related to potential complications secondary to immobility  . Lacks caregiver support  . Chronic Disease Management support and education needs related to how to provide total care, obtain DME and utilize the appropriate resources available  Nurse Case Manager Clinical Goal(s):  [name] Over the next 30 days, patient/spouse will verbalize understanding of plan for obtaining DME.  . Over the next 60 days, patient/spouse will verbalize understanding of Palliative Care Services.  . Over the next 90 days, patient will work with the CCM team to improve knowledge and understanding about potential complications associated with patient's impaired physical mobility including complications related to skin breakdown, and pneumonia, malnutrition and DVT.   CCM SW Interventions Completed 07/15/18 . Outbound call to the patients spouse to assess progression of patient goal . Confirmed the patient did complete intake into Hospice Program . Assessed for patients plan of care - spouse unable to verbalize  . Reminded Mr. Lacson to call the Hospice RN anytime of day if he has a concern regarding [name] - Mr. Llamas verbalized understanding  . Advised Mr. Robinson he would  receive a call from CCM RN Case Manager later in the week but to contact CCM team if assistance is needed prior to the call . Outbound call to [facility] - confirmed the patient was admitted on 6/26 . Voice message left for Eduardo Osier who will be the patients primary RN requesting information on the number of times the patient will be seen each week, wound care orders, as well as if the patient will be granted a bath aide . Collaboration with CCM RN Case Manager regarding above interventions  Patient Self Care Activities:  . Currently UNABLE TO independently participate in [place]  Please see past updates related to this goal by clicking on the "Past Updates" button in the selected goal       . COMPLETED: "she needs a recliner"       Husband Stated  Current Barriers:  . Financial constraints . ADL IADL limitations  Clinical Social Work Clinical Goal(s):  [name] Over the next 45 days, client will work with SW to address concerns related to home equipment needs  Interventions: . CCM SW has yet to receive resources for patient goal for a recliner . Outbound call to the patients husband who reports the patient continues to stay in bed most of the day due to decline . CCM SW to complete goal due to lack of resources and patient desire to remain in bed  Patient Self Care Activities:  . Currently UNABLE TO independently perform ADL's or iADL's due to recent hip fracture  Please see past updates related to this goal by clicking on the "  Past Updates" button in the selected goal          Follow Up Plan: CCM SW will continue to collaborate with CCM RN Case Manager regarding patient needs. Once plan of care is confirmed to be in place to meet the patients needs, CCM SW will sign off considering the patient will receive support from the [place], CDP Social Worker, Certified Dementia Practitioner [place] [facility] [place] [facility] [phone]  Total time spent  performing care coordination and/or care management activities with the patient by phone or face to face = 12 minutes.

## 2018-07-15 NOTE — Patient Instructions (Signed)
Social Worker Visit Information  Goals we discussed today:  Goals Addressed            This Visit's Progress   . "I need help caring for my wife"       Spouse states  Current Barriers:  [name] Knowledge Deficits related to potential complications secondary to immobility  . Lacks caregiver support  . Chronic Disease Management support and education needs related to how to provide total care, obtain DME and utilize the appropriate resources available  Nurse Case Manager Clinical Goal(s):  [name] Over the next 30 days, patient/spouse will verbalize understanding of plan for obtaining DME.  . Over the next 60 days, patient/spouse will verbalize understanding of Palliative Care Services.  . Over the next 90 days, patient will work with the CCM team to improve knowledge and understanding about potential complications associated with patient's impaired physical mobility including complications related to skin breakdown, and pneumonia, malnutrition and DVT.   CCM [name] Interventions:  07/12/18 Collaboration with [facility]   . Placed an outbound call to [facility], left a voice message for the intake department requesting a return call to confirm the date/time Becky [name] will receive her Hospice intake visit  . Sent an in basket message to clinical navigator Becky [name], requested verification orders for Hospice have been received and the initial Hospice visit scheduled . Received inbound call from Becky [name] from [place], she confirmed Becky [name] is scheduled to have a Hospice Intake visit this afternoon at 2:30 PM   07/12/18 Case Collaboration with [place]   . Placed outbound call to [place] with [place] - informed nurse Becky [name] I received confirmation from [facility] that a Hospice intake visit is scheduled for today at 2:30 PM  07/12/18 Outbound call attempted to Becky [name]  . Placed an outbound call to Becky [name], a HIPAA compliant voice message was left; provided my  contact number and encouraged a call back if needed  CCM SW Interventions Completed 07/15/18 . Outbound call to the patients spouse to assess progression of patient goal . Confirmed the patient did complete intake into Hospice Program . Assessed for patients plan of care - spouse unable to verbalize  . Reminded Becky [name] to call the Hospice [name] anytime of day if he has a concern regarding Becky [name] - Becky [name] verbalized understanding  . Advised Becky [name] he would receive a call from CCM [name] Case Manager later in the week but to contact CCM team if assistance is needed prior to the call . Outbound call to [facility] - confirmed the patient was admitted on 6/26 . Voice message left for Becky [name] who will be the patients primary [name] requesting information on the number of times the patient will be seen each week, wound care orders, as well as if the patient will be granted a bath aide . Collaboration with CCM [name] Case Manager regarding above interventions  Patient Self Care Activities:  . Currently UNABLE TO independently participate in [name]  Please see past updates related to this goal by clicking on the "Past Updates" button in the selected goal       . COMPLETED: "she needs a recliner"       Husband Stated  Current Barriers:  . Financial constraints . ADL IADL limitations  Clinical Social Work Clinical Goal(s):  [name] Over the next 45 days, client will work with SW to address concerns related to home equipment needs  Interventions: . CCM SW has yet  to receive resources for patient goal for a recliner . Outbound call to the patients husband who reports the patient continues to stay in bed most of the day due to decline . CCM SW to complete goal due to lack of resources and patient desire to remain in bed  Patient Self Care Activities:  . Currently UNABLE TO independently perform ADL's or iADL's due to recent hip fracture  Please see past updates related to this goal by  clicking on the "Past Updates" button in the selected goal           Follow Up Plan: Client will contact CCM SW for future case management needs.  Becky [name], BSW, CDP Social Worker, Certified Dementia Practitioner [place] [facility] [place] [facility] [phone]

## 2018-07-15 NOTE — Patient Instructions (Signed)
Social Worker Visit Information  Goals we discussed today:  Goals Addressed            This Visit's Progress   . "I need help caring for my wife"       Spouse states  Current Barriers:  [name] Knowledge Deficits related to potential complications secondary to immobility  . Lacks caregiver support  . Chronic Disease Management support and education needs related to how to provide total care, obtain DME and utilize the appropriate resources available  Nurse Case Manager Clinical Goal(s):  [name] Over the next 30 days, patient/spouse will verbalize understanding of plan for obtaining DME.  . Over the next 60 days, patient/spouse will verbalize understanding of Palliative Care Services.  . Over the next 90 days, patient will work with the CCM team to improve knowledge and understanding about potential complications associated with patient's impaired physical mobility including complications related to skin breakdown, and pneumonia, malnutrition and DVT.   CCM RN Interventions:  07/15/18 Internal collaboration with embedded BSW [facility]  . Received an update regarding patient's Hospice Care home visit was completed by nurse Eduardo Osier . Discussed Mr. Shampine does not know next steps for patient as far as next planned visit with Hospice RN and or CNA . Discussed Tillie Rung will contact Hospice nurse Eduardo Osier for collaborating regarding next steps . Plan to contact Mr. Blyden this week to f/u   CCM SW Interventions Completed 07/15/18 . Outbound call to the patients spouse to assess progression of patient goal . Confirmed the patient did complete intake into Hospice Program . Assessed for patients plan of care - spouse unable to verbalize  . Reminded Mr. Yilmaz to call the Hospice RN anytime of day if he has a concern regarding [name] - Mr. Hipp verbalized understanding  . Advised Mr. Arriola he would receive a call from CCM RN Case Manager later in the week but to contact CCM team if  assistance is needed prior to the call . Outbound call to [facility] - confirmed the patient was admitted on 6/26 . Voice message left for Eduardo Osier who will be the patients primary RN requesting information on the number of times the patient will be seen each week, wound care orders, as well as if the patient will be granted a bath aide . Collaboration with CCM RN Case Manager regarding above interventions  . Voice message received from [facility] RN Case Manager Eduardo Osier who reports she has yet to establish a home visit date but was hoping to see the patient either today (6/29) or Wednesday (7/1) Reports difficulty contacting the patient . Informed via voice message plans to have RN visit the patient minimum 2 x weekly and aide to visit the patient 3 x weekly. Reports once initial home visit is accomplished this plan can be finalized . Outbound call to [facility] RN Case Manager Eduardo Osier on cell number provided [phone])  . Voice message left updating on contact number to the patients spouse Blaine Hamper Patient Self Care Activities:  . Currently UNABLE TO independently participate in [place]  Please see past updates related to this goal by clicking on the "Past Updates" button in the selected goal           Materials Provided: No. Patient not reached.  Follow Up Plan: CCM SW will continue to follow until hospice plan of care confirmed   [name], BSW, CDP Social Worker, Certified Dementia Practitioner [place] [facility] [place] [facility] [phone]

## 2018-07-15 NOTE — Chronic Care Management (AMB) (Signed)
  Chronic Care Management   Follow Up Note   07/15/2018 Name: [name] MRN: [id number] DOB: [birth year]  Referred by: Patient, No Pcp Per Reason for referral : Chronic Care Management (CCM Case Collaboration )   [name] is a 78 y.o. year old female who is a primary care patient of Patient, No Pcp Per. The CCM team was consulted for assistance with chronic disease management and care coordination needs.    Review of patient status, including review of consultants reports, relevant laboratory and other test results, and collaboration with appropriate care team members and the patient's provider was performed as part of comprehensive patient evaluation and provision of chronic care management services.    Received update from embedded [place] regarding referral for Hospice Care.   Goals Addressed    . "I need help caring for my wife"       Spouse states  Current Barriers:  [name] Knowledge Deficits related to potential complications secondary to immobility  . Lacks caregiver support  . Chronic Disease Management support and education needs related to how to provide total care, obtain DME and utilize the appropriate resources available  Nurse Case Manager Clinical Goal(s):  [name] Over the next 30 days, patient/spouse will verbalize understanding of plan for obtaining DME.  . Over the next 60 days, patient/spouse will verbalize understanding of Palliative Care Services.  . Over the next 90 days, patient will work with the CCM team to improve knowledge and understanding about potential complications associated with patient's impaired physical mobility including complications related to skin breakdown, and pneumonia, malnutrition and DVT.   CCM RN Interventions:  07/15/18 Internal collaboration with embedded BSW [facility]  . Received an update regarding patient's Hospice Care home visit was completed by nurse [name] . Discussed [name] does not know next steps  for patient as far as next planned visit with Hospice RN and or CNA . Discussed [name] will contact Hospice nurse [name] for collaborating regarding next steps . Plan to contact [name] this week to f/u   CCM SW Interventions Completed 07/15/18 . Outbound call to the patients spouse to assess progression of patient goal . Confirmed the patient did complete intake into Hospice Program . Assessed for patients plan of care - spouse unable to verbalize  . Reminded [name] to call the Hospice RN anytime of day if he has a concern regarding [name] - [name] verbalized understanding  . Advised [name] he would receive a call from CCM RN Case Manager later in the week but to contact CCM team if assistance is needed prior to the call . Outbound call to [facility] - confirmed the patient was admitted on 6/26 . Voice message left for [name] who will be the patients primary RN requesting information on the number of times the patient will be seen each week, wound care orders, as well as if the patient will be granted a bath aide . Collaboration with CCM RN Case Manager regarding above interventions  Patient Self Care Activities:  . Currently UNABLE TO independently participate in [place]  Please see past updates related to this goal by clicking on the "Past Updates" button in the selected goal         Telephone follow up appointment with care management team member scheduled for: 07/17/18   [name], RN, BSN, CCM Care Management Coordinator [place] [facility]  Direct Phone: [phone]

## 2018-07-17 ENCOUNTER — Other Ambulatory Visit: Payer: Self-pay

## 2018-07-17 ENCOUNTER — Telehealth: Payer: Self-pay

## 2018-07-17 ENCOUNTER — Ambulatory Visit: Payer: Self-pay

## 2018-07-17 DIAGNOSIS — R413 Other amnesia: Secondary | ICD-10-CM

## 2018-07-17 DIAGNOSIS — R5381 Other malaise: Secondary | ICD-10-CM

## 2018-07-17 DIAGNOSIS — I130 Hypertensive heart and chronic kidney disease with heart failure and stage 1 through stage 4 chronic kidney disease, or unspecified chronic kidney disease: Secondary | ICD-10-CM

## 2018-07-17 DIAGNOSIS — I5032 Chronic diastolic (congestive) heart failure: Secondary | ICD-10-CM

## 2018-07-17 DIAGNOSIS — M059 Rheumatoid arthritis with rheumatoid factor, unspecified: Secondary | ICD-10-CM

## 2018-07-17 NOTE — Patient Instructions (Signed)
Social Worker Visit Information  Goals we discussed today:  Goals Addressed            This Visit's Progress   . "I need help caring for my wife"       Spouse states  Current Barriers:  [name] Knowledge Deficits related to potential complications secondary to immobility  . Lacks caregiver support  . Chronic Disease Management support and education needs related to how to provide total care, obtain DME and utilize the appropriate resources available  Nurse Case Manager Clinical Goal(s):  [name] Over the next 30 days, patient/spouse will verbalize understanding of plan for obtaining DME.  . Over the next 60 days, patient/spouse will verbalize understanding of Palliative Care Services.  . Over the next 90 days, patient will work with the CCM team to improve knowledge and understanding about potential complications associated with patient's impaired physical mobility including complications related to skin breakdown, and pneumonia, malnutrition and DVT.   CCM RN Interventions:  07/15/18 Internal collaboration with embedded BSW [facility]  . Received an update regarding patient's Hospice Care home visit was completed by nurse Eduardo Osier . Discussed Mr. Wescott does not know next steps for patient as far as next planned visit with Hospice RN and or CNA . Discussed Tillie Rung will contact Hospice nurse Eduardo Osier for collaborating regarding next steps . Plan to contact Mr. Kuklinski this week to f/u   CCM SW Interventions Completed 07/17/18 . Voice message received from [facility] RN Case Manager Eduardo Osier who reports she met with the patient and Mr Dinkel in the home today. It is reported the patient is scheduled to receive RN visits 1-2 times weekly as well as aide visits three times weekly. . Informed via voice message Hospice RN will begin managing all patient's case management needs. [name] CCM SW collaborated with CCM RN Case Freight forwarder regarding updates. [name] CCM SW to sign off at this time as the  patient will receive services from Hospice team. Patient Self Care Activities:  . Currently UNABLE TO independently participate in [place]  Please see past updates related to this goal by clicking on the "Past Updates" button in the selected goal           Materials Provided: No. Patient not reached.  Follow Up Plan: No planned follow up at this time. Please continue services with [facility]. Contact me if future assistance is needed.   [name], BSW, CDP Social Worker, Certified Dementia Practitioner [place] [facility] [place] [facility] [phone]

## 2018-07-17 NOTE — Chronic Care Management (AMB) (Signed)
Chronic Care Management   Follow Up Note   07/17/2018 Name: [name] MRN: [id number] DOB: 07/02/1940  Referred by: Patient, No Pcp Per Reason for referral : Chronic Care Management (CCM RNCM Telephone Follow up )   [name] is a 78 y.o. year old female who is a primary care patient of Patient, No Pcp Per. The CCM team was consulted for assistance with chronic disease management and care coordination needs.    Review of patient status, including review of consultants reports, relevant laboratory and other test results, and collaboration with appropriate care team members and the patient's provider was performed as part of comprehensive patient evaluation and provision of chronic care management services.    I spoke with Mr. Loring by telephone today to f/u on patient's Hospice Care.   Goals Addressed    . COMPLETED: "I need help caring for my wife"       Spouse states  Current Barriers:  [name] Knowledge Deficits related to potential complications secondary to immobility  . Lacks caregiver support  . Chronic Disease Management support and education needs related to how to provide total care, obtain DME and utilize the appropriate resources available  Nurse Case Manager Clinical Goal(s):  [name] Over the next 30 days, patient/spouse will verbalize understanding of plan for obtaining DME. 07/17/18- Goal Met . Over the next 60 days, patient/spouse will verbalize understanding of Palliative Care Services. 07/17/18- Goal Met . Over the next 90 days, patient will work with the CCM team to improve knowledge and understanding about potential complications associated with patient's impaired physical mobility including complications related to skin breakdown, and pneumonia, malnutrition and DVT. 07/17/18- Goal Met - patient transitioned to Hospice  CCM RN CM Interventions:  07/17/18 completed call with husband Mr. Najarro   . Spoke with Mr. Somma to confirm [place] has been initiated  . Discussed patient is receiving Hospice services, including, nursing, CNA services for bathing and DME . Discussed patient and spouse just finished a visit with the Chaplain . Discussed the embedded CCM team will close Mrs. Honeyman from the [employer] . Encouraged Mr. Burlison to call the CCM team if needed for questions or concerns that may arise . Update forwarded to Dr. Baird Cancer  CCM SW Interventions Completed 07/17/18 . Voice message received from [facility] RN Case Manager Eduardo Osier who reports she met with the patient and Mr Bergren in the home today. It is reported the patient is scheduled to receive RN visits 1-2 times weekly as well as aide visits three times weekly. . Informed via voice message Hospice RN will begin managing all patient's case management needs. [name] CCM SW collaborated with CCM RN Case Freight forwarder regarding updates. [name] CCM SW to sign off at this time as the patient will receive services from Hospice team. Patient Self Care Activities:  . Currently UNABLE TO independently participate in [place]  Please see past updates related to this goal by clicking on the "Past Updates" button in the selected goal     . COMPLETED: "I think she has a UTI"       Husband Stated:  Current Barriers:  . Increased confusion "it's like she won't connect to stuff you say"  Clinical Social Work Clinical Goal(s):  [name] Over the next 5 days, patient will work with home health RN to complete an order for U/A Culture and Sensitivity. Goal Met  CCM RN CM Interventions: 07/17/18 call completed with Mr. Sorber  . U/A and C&S  completed by home health nurse and the appropriate orders were provided  Patient Self Care Activities:  . Currently UNABLE TO independently perform ADL's and iADL's  Initial goal documentation    . COMPLETED: "She still has a Foley Catheter"       Husband stated  Current Barriers:  . High Risk for infection related to indwelling Foley catheter  Nurse Case Manager  Clinical Goal(s):  [name] Over the next 14 days, husband/caregiver will be able to verbalize s/s of a UTI and know when to call the doctor if symptoms occur . Over the next 30 days, husband/caregiver will have a good understanding of how to care for patient's indwelling Foley catheter and will verbalize MD recommendations for plan and frequency to change out catheter . Over the next 90 days, patient will not experience hospital admission. Hospital Admissions in last 6 months = 4 07/17/18 Goal Met  CCM RN CM Interventions:  07/17/18 call completed with Mr. Lingo  . Spoke with Mr. Ayars to confirm [place] has been initiated . Discussed patient is receiving Hospice services, including, nursing, CNA services for bathing and DME . Discussed patient and spouse just finished a visit with the Chaplain . Discussed the embedded CCM team will close Mrs. Westerhoff from the [employer] . Encouraged Mr. Witcher to call the CCM team if needed for questions or concerns that may arise . Update forwarded to Dr. Baird Cancer  Patient Self Care Activities:  . Currently UNABLE TO independently perform self care  Please see past updates related to this goal by clicking on the "Past Updates" button in the selected goal    . COMPLETED: Assist patient/spouse with disease management and care coordination needs.        Current Barriers:   Ineffective Self Health Maintenance  Knowledge Deficits related to short term plan for care coordination needs and long term plans for chronic disease management needs  Nurse Case Manager Clinical Goal(s):   Over the next 90 days, patient will work with care management team to address care coordination and chronic disease management needs related to Disease Management; Care Coordination; Medication Management and Education; Psychosocial Support; Caregiver Stress support; and Level of Care Concerns.     CCM RN CM Interventions:  07/17/18 call completed with Mr. Morash  . Spoke with  Mr. Hosterman to confirm [place] has been initiated . Discussed patient is receiving Hospice services, including, nursing, CNA services for bathing and DME . Discussed patient and spouse just finished a visit with the Chaplain . Discussed the embedded CCM team will close Mrs. Eisenbeis from the [employer] . Encouraged Mr. Szatkowski to call the CCM team if needed for questions or concerns that may arise . Update forwarded to Dr. Baird Cancer  Patient Self Care Activities:   Currently UNABLE TO independently self manage needs related to chronic health conditions.   Please see past updates related to this goal by clicking on the "Past Updates" button in the selected goal         The patient has been provided with contact information for the care management team and has been advised to call with any health related questions or concerns.    [name], RN, BSN, CCM Care Management Coordinator [place] [facility]  Direct Phone: [phone]

## 2018-07-17 NOTE — Chronic Care Management (AMB) (Signed)
  Chronic Care Management   Social Work Follow Up Note  07/17/2018 Name: [name] MRN: [id number] DOB: [birth year]  [name] is a 78 y.o. year old female who is a primary care patient of Glendale Chard, MD. The CCM team was consulted for assistance with Hospice/Palliative Care Services Education.   Review of patient status, including review of consultants reports, other relevant assessments, and collaboration with appropriate care team members and the patient's provider was performed as part of comprehensive patient evaluation and provision of chronic care management services.    Care coordination performed today as the result of a voice message received from [place] with [facility]   Goals Addressed            This Visit's Progress   . "I need help caring for my wife"       Spouse states  Current Barriers:  [name] Knowledge Deficits related to potential complications secondary to immobility  . Lacks caregiver support  . Chronic Disease Management support and education needs related to how to provide total care, obtain DME and utilize the appropriate resources available  Nurse Case Manager Clinical Goal(s):  [name] Over the next 30 days, patient/spouse will verbalize understanding of plan for obtaining DME.  . Over the next 60 days, patient/spouse will verbalize understanding of Palliative Care Services.  . Over the next 90 days, patient will work with the CCM team to improve knowledge and understanding about potential complications associated with patient's impaired physical mobility including complications related to skin breakdown, and pneumonia, malnutrition and DVT.    CCM SW Interventions Completed 07/17/18 . Voice message received from [facility] RN Case Manager [name] who reports she met with the patient and [name] in the home today. It is reported the patient is scheduled to receive RN visits 1-2 times  weekly as well as aide visits three times weekly. . Informed via voice message Hospice RN will begin managing all patient's case management needs. [name] CCM SW collaborated with CCM RN Case Freight forwarder regarding updates. [name] CCM SW to sign off at this time as the patient will receive services from Hospice team. Patient Self Care Activities:  . Currently UNABLE TO independently participate in [place]  Please see past updates related to this goal by clicking on the "Past Updates" button in the selected goal           Follow Up Plan: No further SW follow up scheduled at this time. The patient will be [facility] team. CCM SW to remain available to assist as needed for any future patient needs.    [name], BSW, CDP Social Worker, Certified Dementia Practitioner [place] [facility] [place] [facility] [phone]  Total time spent performing care coordination and/or care management activities with the patient by phone or face to face = 10 minutes.

## 2018-07-17 NOTE — Patient Instructions (Signed)
Visit Information  Goals Addressed    . COMPLETED: "I need help caring for my wife"       Spouse states  Current Barriers:  [name] Knowledge Deficits related to potential complications secondary to immobility  . Lacks caregiver support  . Chronic Disease Management support and education needs related to how to provide total care, obtain DME and utilize the appropriate resources available  Nurse Case Manager Clinical Goal(s):  [name] Over the next 30 days, patient/spouse will verbalize understanding of plan for obtaining DME. 07/17/18- Goal Met . Over the next 60 days, patient/spouse will verbalize understanding of Palliative Care Services. 07/17/18- Goal Met . Over the next 90 days, patient will work with the CCM team to improve knowledge and understanding about potential complications associated with patient's impaired physical mobility including complications related to skin breakdown, and pneumonia, malnutrition and DVT. 07/17/18- Goal Met - patient transitioned to Hospice  CCM RN CM Interventions:  07/17/18 completed call with husband Mr. Indelicato   . Spoke with Mr. Rothschild to confirm [place] has been initiated . Discussed patient is receiving Hospice services, including, nursing, CNA services for bathing and DME . Discussed patient and spouse just finished a visit with the Chaplain . Discussed the embedded CCM team will close Mrs. Doukas from the [employer] . Encouraged Mr. Swinford to call the CCM team if needed for questions or concerns that may arise . Update forwarded to Dr. Baird Cancer  CCM SW Interventions Completed 07/17/18 . Voice message received from [facility] RN Case Manager Eduardo Osier who reports she met with the patient and Mr Knutson in the home today. It is reported the patient is scheduled to receive RN visits 1-2 times weekly as well as aide visits three times weekly. . Informed via voice message Hospice RN will begin managing all patient's case management needs. [name] CCM SW  collaborated with CCM RN Case Freight forwarder regarding updates. [name] CCM SW to sign off at this time as the patient will receive services from Hospice team. Patient Self Care Activities:  . Currently UNABLE TO independently participate in [place]  Please see past updates related to this goal by clicking on the "Past Updates" button in the selected goal     . COMPLETED: "I think she has a UTI"       Husband Stated:  Current Barriers:  . Increased confusion "it's like she won't connect to stuff you say"  Clinical Social Work Clinical Goal(s):  [name] Over the next 5 days, patient will work with home health RN to complete an order for U/A Culture and Sensitivity. Goal Met  CCM RN CM Interventions: 07/17/18 call completed with Mr. Blyden  . U/A and C&S completed by home health nurse and the appropriate orders were provided  Patient Self Care Activities:  . Currently UNABLE TO independently perform ADL's and iADL's  Initial goal documentation    . COMPLETED: "She still has a Foley Catheter"       Husband stated  Current Barriers:  . High Risk for infection related to indwelling Foley catheter  Nurse Case Manager Clinical Goal(s):  [name] Over the next 14 days, husband/caregiver will be able to verbalize s/s of a UTI and know when to call the doctor if symptoms occur . Over the next 30 days, husband/caregiver will have a good understanding of how to care for patient's indwelling Foley catheter and will verbalize MD recommendations for plan and frequency to change out catheter . Over the next 90 days, patient  will not experience hospital admission. Hospital Admissions in last 6 months = 4 07/17/18 Goal Met  CCM RN CM Interventions:  07/17/18 call completed with Mr. Mikels  . Spoke with Mr. Panozzo to confirm [place] has been initiated . Discussed patient is receiving Hospice services, including, nursing, CNA services for bathing and DME . Discussed patient and spouse just finished a visit with  the Chaplain . Discussed the embedded CCM team will close Mrs. Knoff from the [employer] . Encouraged Mr. Bray to call the CCM team if needed for questions or concerns that may arise . Update forwarded to Dr. Baird Cancer  Patient Self Care Activities:  . Currently UNABLE TO independently perform self care  Please see past updates related to this goal by clicking on the "Past Updates" button in the selected goal      . COMPLETED: Assist patient/spouse with disease management and care coordination needs.        Current Barriers:   Ineffective Self Health Maintenance  Knowledge Deficits related to short term plan for care coordination needs and long term plans for chronic disease management needs  Nurse Case Manager Clinical Goal(s):   Over the next 90 days, patient will work with care management team to address care coordination and chronic disease management needs related to Disease Management; Care Coordination; Medication Management and Education; Psychosocial Support; Caregiver Stress support; and Level of Care Concerns.     CCM RN CM Interventions:  07/17/18 call completed with Mr. Mcneff  . Spoke with Mr. Glade to confirm [place] has been initiated . Discussed patient is receiving Hospice services, including, nursing, CNA services for bathing and DME . Discussed patient and spouse just finished a visit with the Chaplain . Discussed the embedded CCM team will close Mrs. Lick from the [employer] . Encouraged Mr. Paulick to call the CCM team if needed for questions or concerns that may arise . Update forwarded to Dr. Baird Cancer  Patient Self Care Activities:   Currently UNABLE TO independently self manage needs related to chronic health conditions.   Please see past updates related to this goal by clicking on the "Past Updates" button in the selected goal        The patient verbalized understanding of instructions provided today and declined a print copy of patient  instruction materials.   The patient has been provided with contact information for the care management team and has been advised to call with any health related questions or concerns.   [name], RN, BSN, CCM Care Management Coordinator [place] [facility]  Direct Phone: [phone]

## 2018-07-23 ENCOUNTER — Ambulatory Visit: Payer: Medicare PPO | Admitting: Internal Medicine

## 2018-09-09 ENCOUNTER — Encounter: Payer: Self-pay | Admitting: Internal Medicine

## 2018-09-16 NOTE — Progress Notes (Deleted)
Cardiology Office Note   Date:  09/16/2018   ID:  REMI RESTER, DOB 05/28/1940, MRN [id number]  PCP:  Patient, No Pcp Per    No chief complaint on file.    Wt Readings from Last 3 Encounters:  06/27/18 102 lb (46.3 kg)  06/25/18 114 lb (51.7 kg)  06/11/18 80 lb 11 oz (36.6 kg)       History of Present Illness: [name] is a 78 y.o. 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  02/2018 cath showed:  Prox RCA lesion is 100% stenosed.  A drug-eluting stent was successfully placed using a STENT SYNERGY DES 2.5X38, postdilated to > 3.0 mm.  Post intervention, there is a 0% residual stenosis.  2nd Diag lesion is 40% stenosed.  Ost 2nd Diag lesion is 50% stenosed.  The left ventricular systolic function is normal.  LV end diastolic pressure is normal.  The left ventricular ejection fraction is 55-65% by visual estimate.  There is no aortic valve stenosis.  A drug-eluting stent was successfully placed using a STENT SYNERGY DES 2.5X38.  SHe was intubated at the time of cath.   02/2018 echo showed: The left ventricle has hyperdynamic systolic function, with an ejection fraction of >65%. The cavity size was normal. Severe basal septal hypertrophy of the septal wall. Left ventricular diastolic Doppler parameters are consistent with impaired  relaxation Elevated left ventricular end-diastolic pressure The E/e' is 26.2. No evidence of left ventricular regional wall motion abnormalities.  2. There is dynamic mid cavitary LV obstruction at rest up to 92mmHg.  3. No evidence of left ventricular regional wall motion abnormalities.  4. The right ventricle has normal systolic function. The cavity was  normal. There is no increase in right ventricular wall thickness.  5. The anterior MV leaflet appears thickened at the tip. Cannot rule out myxomatous degeneration. Mitral valve regurgitation is moderate by color flow Doppler. The MR jet is eccentric laterally directed.  6. Consider TEE for further evaluation of MV.  7. The tricuspid valve is normal in structure.  8. The aortic valve has an indeterminant number of cusps Moderate calcification of the aortic valve. mild stenosis of the aortic valve.  9. The pulmonic valve was normal in structure. 10. The aortic root and ascending aorta are normal in size and structure. 11. Right atrial pressure is estimated at 3 mmHg.    Past Medical History:  Diagnosis Date  . Acute kidney injury superimposed on chronic kidney disease ([place]) 02/06/2015  . Anxiety   . AVM (arteriovenous malformation)   . CAD (coronary artery disease) 02/28/2018   a. 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  . Chronic diastolic CHF (congestive heart failure) ([place])   . Depression   . DVT (deep venous thrombosis) ([facility]) 1970s   LLE  . GI bleed 02/2018   a. 02/2018 - ABL anemia/GIB - > endoscopy with gastric AVMs s/p clipping.  [name] Heart murmur   . Hypercholesterolemia   . Hypertension   . Hypertrophic cardiomyopathy ([place])    a. severe basal septal hypertrophy by echo 02/2018 with dynamic mid cavity gradient at rest.  . Kidney stones   . Migraine    "a few/year" (07/16/2015)  .  Moderate mitral regurgitation   . Multiple falls   . Muscular deconditioning   . Pneumonia "several times"  . Rheumatoid arthritis(714.0)    "all over" (07/16/2015)  . Seizure [facility])    "last one was 1//2017" (07/16/2015)  . Syncope and collapse   . Vision abnormalities     Past Surgical History:  Procedure Laterality Date  . ABDOMINAL HYSTERECTOMY    . CARPAL TUNNEL RELEASE Bilateral   . CATARACT EXTRACTION W/ INTRAOCULAR LENS  IMPLANT, BILATERAL Bilateral   . CORONARY/GRAFT ACUTE MI  REVASCULARIZATION N/A 02/28/2018   Procedure: Coronary/Graft Acute MI Revascularization;  Surgeon: Jettie Booze, MD;  Location: [facility];  Service: Cardiovascular;  Laterality: N/A;  . ESOPHAGOGASTRODUODENOSCOPY N/A 03/05/2018   Procedure: ESOPHAGOGASTRODUODENOSCOPY (EGD);  Surgeon: Ladene Artist, MD;  Location[facility] END[facility];  Service: Endoscopy;  Laterality: N/A;  . INTRAMEDULLARY (IM) NAIL INTERTROCHANTERIC Left 06/07/2018   Procedure: INTRAMEDULLARY (IM) NAIL INTERTROCHANTRIC;  Surgeon: Erle Crocker, MD;  Location: [facility];  Service: Orthopedics;  Laterality: Left;  . LEFT HEART CATH AND CORONARY ANGIOGRAPHY N/A 02/28/2018   Procedure: LEFT HEART CATH AND CORONARY ANGIOGRAPHY;  Surgeon: Jettie Booze, MD;  Location: [facility];  Service: Cardiovascular;  Laterality: N/A;  . NECK EXPLORATION  07/16/2015  . PARATHYROIDECTOMY Right 07/16/2015   superior  . PARATHYROIDECTOMY Right 07/16/2015   Procedure: RIGHT INFERIOR PARATHYROIDECTOMY;  Surgeon: Armandina Gemma, MD;  Location: [facility];  Service: General;  Laterality: Right;  . SUBMUCOSAL INJECTION  03/05/2018   Procedure: SUBMUCOSAL INJECTION;  Surgeon: Ladene Artist, MD;  Location: [facility] ENDOSCOPY;  Service: Endoscopy;;  . TOENAIL AVULSION Bilateral    great toe     Current Outpatient Medications  Medication Sig Dispense Refill  . amoxicillin (AMOXIL) 500 MG capsule Take 1 capsule (500 mg total) by mouth daily. 10 capsule 0  . atorvastatin (LIPITOR) 80 MG tablet Take 1 tablet (80 mg total) by mouth daily at 6 PM. 90 tablet 1  . carvedilol (COREG) 12.5 MG tablet Take 2 tablets (25 mg total) by mouth 2 (two) times daily with a meal. 60 tablet 6  . Cholecalciferol (VITAMIN D) 125 MCG (5000 UT) CAPS Take 1 capsule by mouth daily.    . hydroxychloroquine (PLAQUENIL) 200 MG tablet TAKE 1 TABLET BY MOUTH EVERY DAY 30 tablet 3  . lactose free nutrition (BOOST PLUS) LIQD Take 237 mLs by mouth daily.    [name] levETIRAcetam  (KEPPRA) 500 MG tablet TAKE 2 TABLETS BY MOUTH EVERY DAY 180 tablet 0  . mirtazapine (REMERON) 15 MG tablet Take 1 tablet (15 mg total) by mouth at bedtime for 30 days. 30 tablet 0  . pantoprazole (PROTONIX) 40 MG tablet Take 1 tablet (40 mg total) by mouth daily. 90 tablet 1  . ticagrelor (BRILINTA) 90 MG TABS tablet Take 1 tablet (90 mg total) by mouth 2 (two) times daily. 60 tablet 11   No current facility-administered medications for this visit.     Allergies:   Patient has no known allergies.    Social History:  The patient  reports that she quit smoking about 7 months ago. Her smoking use included cigarettes. She has a 62.00 pack-year smoking history. She has never used smokeless tobacco. She reports that she does not drink alcohol or use drugs.   Family History:  The patient's ***family history includes Cancer in her brother and father; Heart disease in her brother and mother; Miscarriages / [place] in her brother; Pneumonia in  her mother; Prostate cancer in her father.    ROS:  Please see the history of present illness.   Otherwise, review of systems are positive for ***.   All other systems are reviewed and negative.    PHYSICAL EXAM: VS:  There were no vitals taken for this visit. , BMI There is no height or weight on file to calculate BMI. GEN: Well nourished, well developed, in no acute distress  HEENT: normal  Neck: no JVD, carotid bruits, or masses Cardiac: ***RRR; no murmurs, rubs, or gallops,no edema  Respiratory:  clear to auscultation bilaterally, normal work of breathing GI: soft, nontender, nondistended, + BS MS: no deformity or atrophy  Skin: warm and dry, no rash Neuro:  Strength and sensation are intact Psych: euthymic mood, full affect   EKG:   The ekg ordered today demonstrates ***   Recent Labs: 04/01/2018: TSH 0.571 04/22/2018: B Natriuretic Peptide 66.7 04/23/2018: Magnesium 2.1 06/10/2018: ALT 6 06/11/2018: BUN 44; Creatinine, Ser 1.73; Hemoglobin  8.2; Platelets 308; Potassium 4.4; Sodium 141   Lipid Panel    Component Value Date/Time   CHOL 168 02/28/2018 0538   TRIG 42 04/21/2018 1926   HDL 42 02/28/2018 0538   CHOLHDL 4.0 02/28/2018 0538   VLDL 6 02/28/2018 0538   LDLCALC 120 (H) 02/28/2018 0538     Other studies Reviewed: Additional studies/ records that were reviewed today with results demonstrating: ***.   ASSESSMENT AND PLAN:  1. CAD/Old MI:  Antiplatelet therapy was limited by GI bleed.   2. Moderate MR/HOCM/ chronic diastolic heart familre: 3. Anemia: Prior GI bleeding   Current medicines are reviewed at length with the patient today.  The patient concerns regarding her medicines were addressed.  The following changes have been made:  No change***  Labs/ tests ordered today include: *** No orders of the defined types were placed in this encounter.   Recommend 150 minutes/week of aerobic exercise Low fat, low carb, high fiber diet recommended  Disposition:   FU in ***   Signed, Larae Grooms, MD  09/16/2018 4:36 PM    [facility] [address], [place], [place]  [address] Phone: [phone]; Fax: [phone]

## 2018-09-17 ENCOUNTER — Ambulatory Visit: Payer: Medicare PPO | Admitting: Interventional Cardiology

## 2018-10-07 ENCOUNTER — Ambulatory Visit: Payer: Medicare PPO | Admitting: Internal Medicine

## 2018-10-08 ENCOUNTER — Ambulatory Visit: Payer: Medicare PPO | Admitting: Internal Medicine

## 2018-10-08 ENCOUNTER — Ambulatory Visit: Payer: Medicare PPO

## 2019-01-17 DEATH — deceased

## 2019-06-03 IMAGING — DX ABDOMEN - 1 VIEW
1 series · 1 of 1 positions shown · non-contrast
Comparison: CT of the abdomen and pelvis on 03/29/2018 and
abdominal film on 03/02/2018

CLINICAL DATA: Abdominal pain.

EXAM:
ABDOMEN - 1 VIEW

[abdomen]
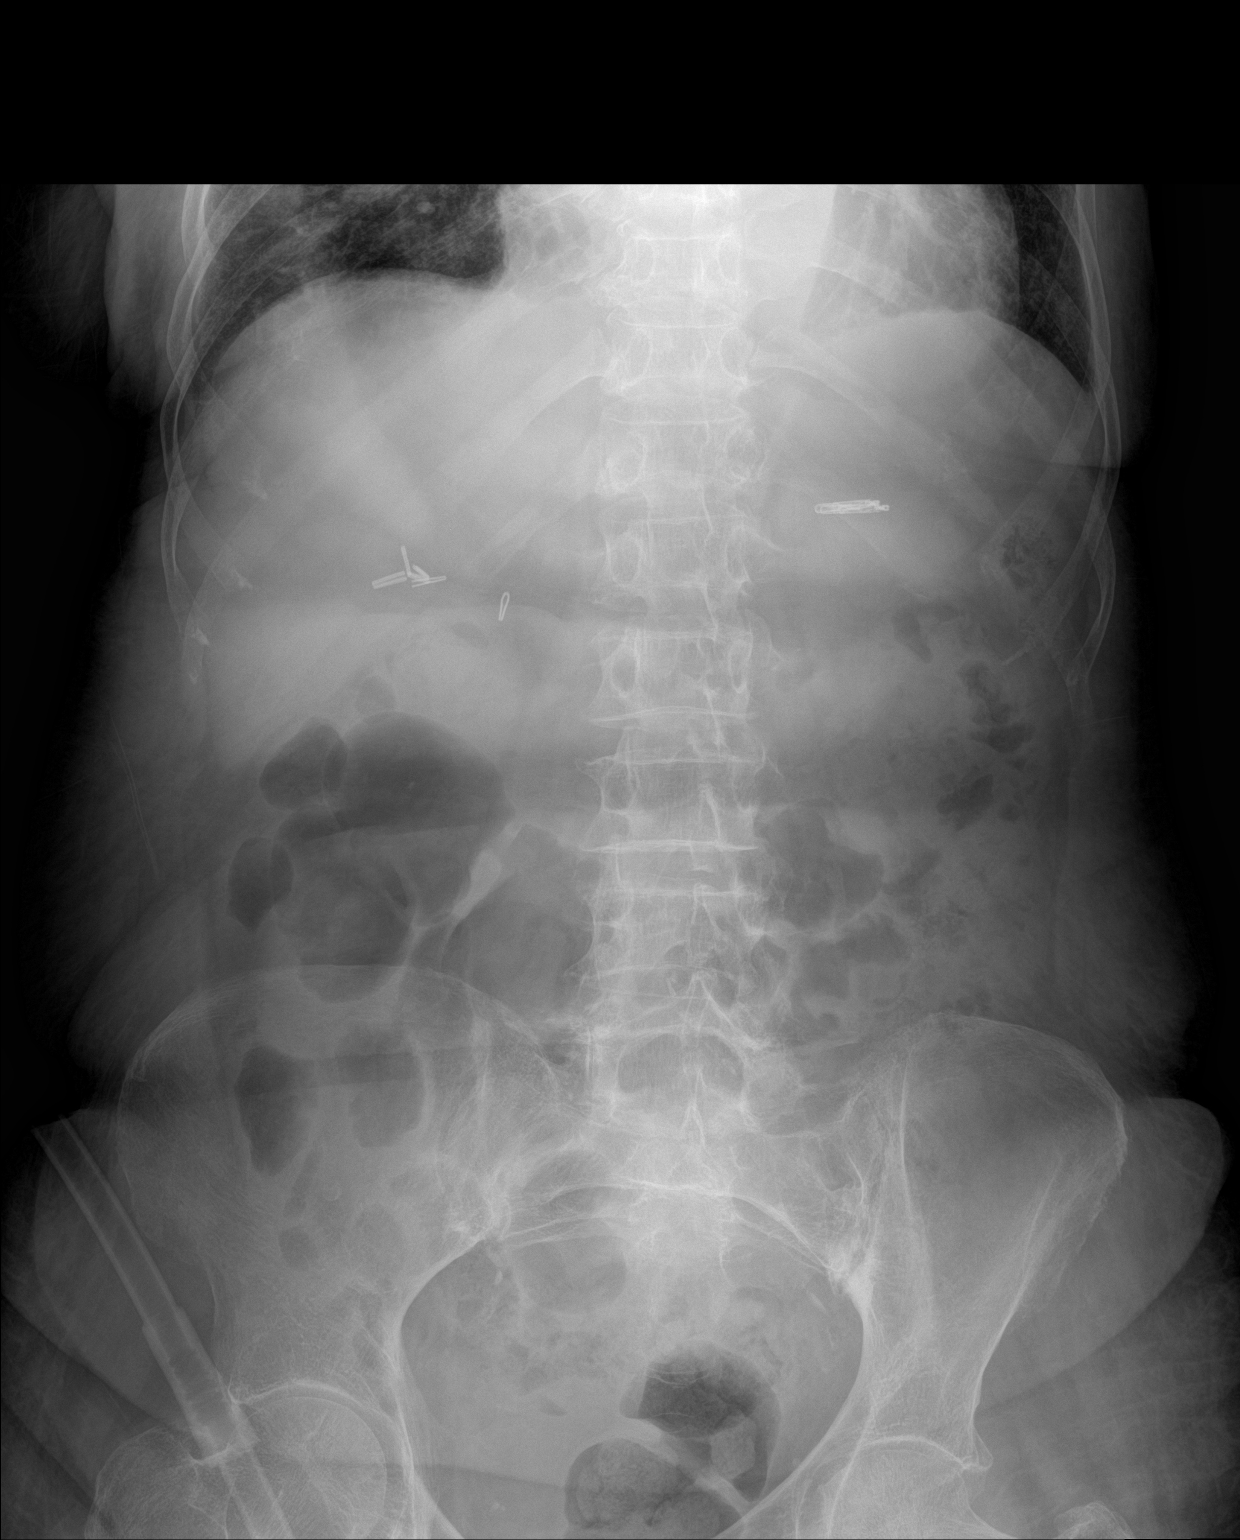

[1 of 1 positions shown; findings below may reference images not displayed]

FINDINGS: No evidence of bowel obstruction, ileus or signs of free air. Status
post cholecystectomy. Endoscopic hemostatic clips are present in the
left upper quadrant in the region of the stomach. No abnormal
calcifications. Visualized bony structures are unremarkable.
IMPRESSION: No acute findings.

## 2019-06-03 IMAGING — DX PORTABLE CHEST - 1 VIEW
1 series · 1 of 1 positions shown · non-contrast
Comparison: Four days ago

CLINICAL DATA: Shortness of breath

EXAM:
PORTABLE CHEST 1 VIEW

[chest]
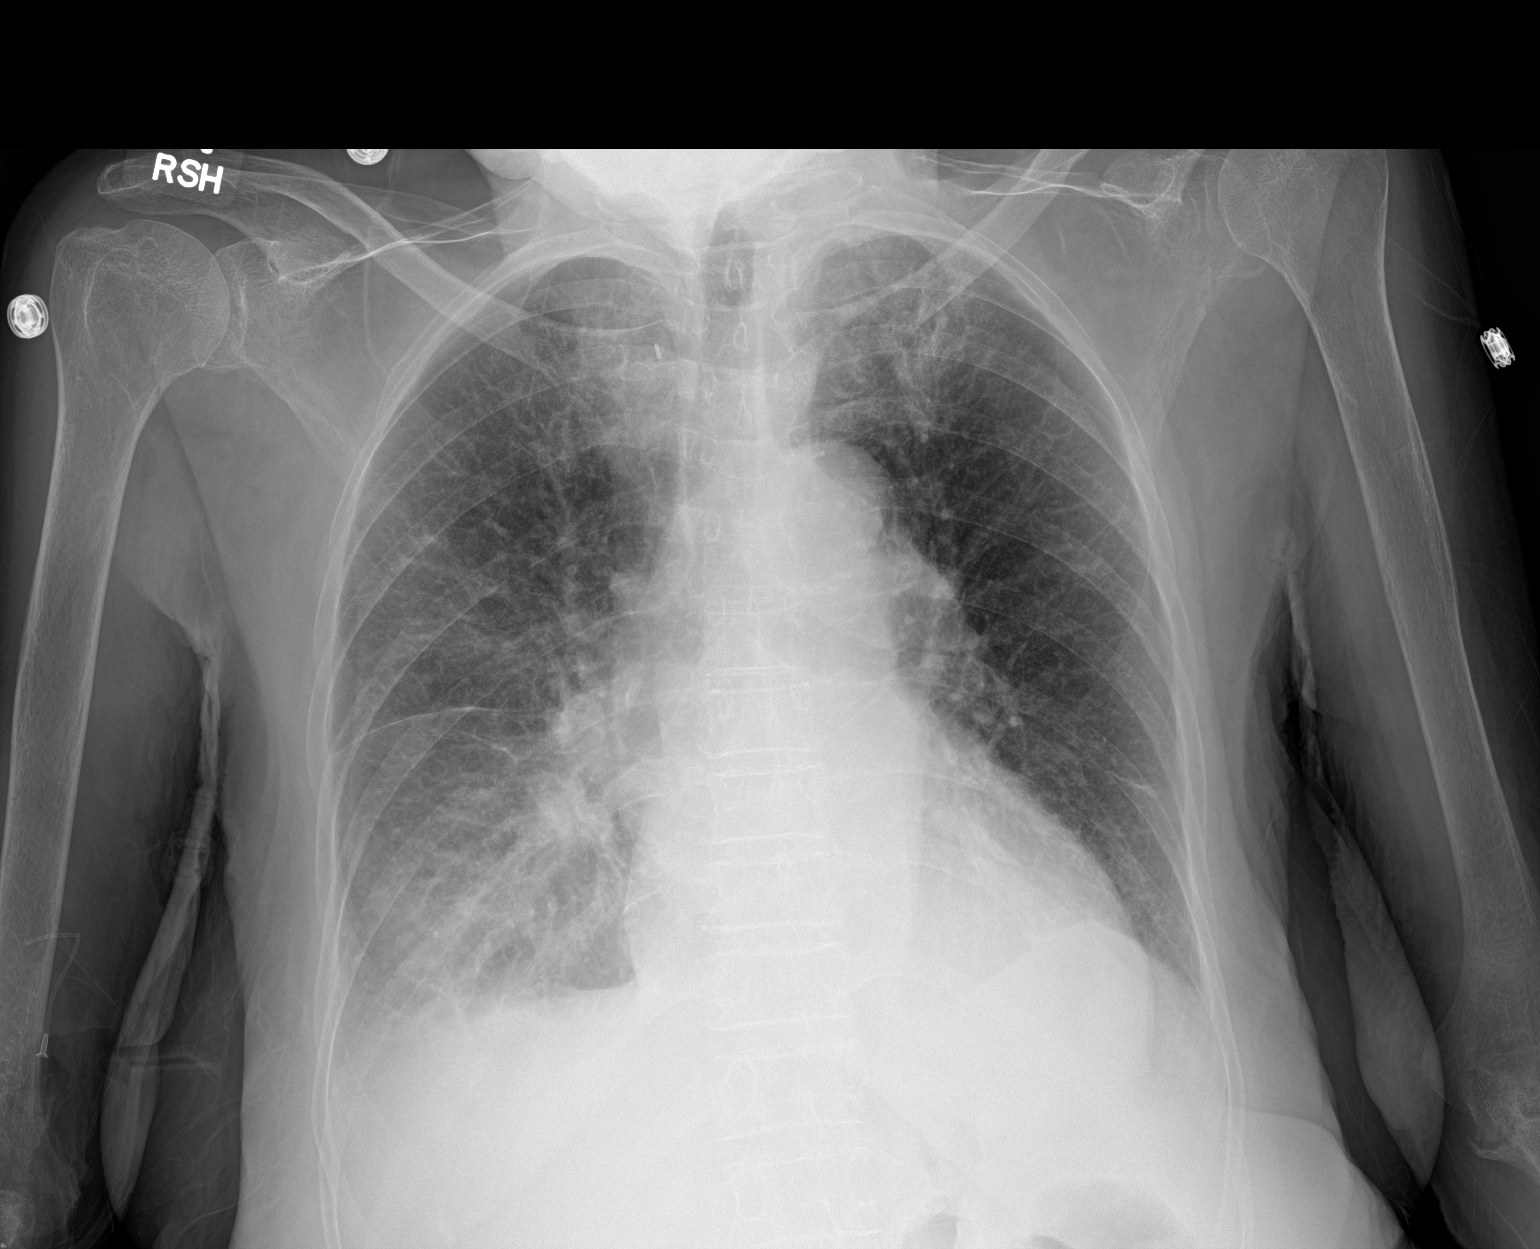

[1 of 1 positions shown; findings below may reference images not displayed]

FINDINGS: Cardiomegaly. Negative aortic contours. Interstitial coarsening
similar to prior, with cephalized blood flow. Probable small right
pleural effusion creating asymmetric density at the right base. No
pneumothorax.
IMPRESSION: Cardiomegaly with pulmonary edema and small right pleural effusion.

## 2019-06-22 IMAGING — DX PORTABLE CHEST - 1 VIEW
2 series · 2 of 2 positions shown · non-contrast
Comparison: 04/02/2018

CLINICAL DATA: Shortness of breath.  Respiratory distress.

EXAM:
PORTABLE CHEST 1 VIEW

[chest ap (1 of 2)]
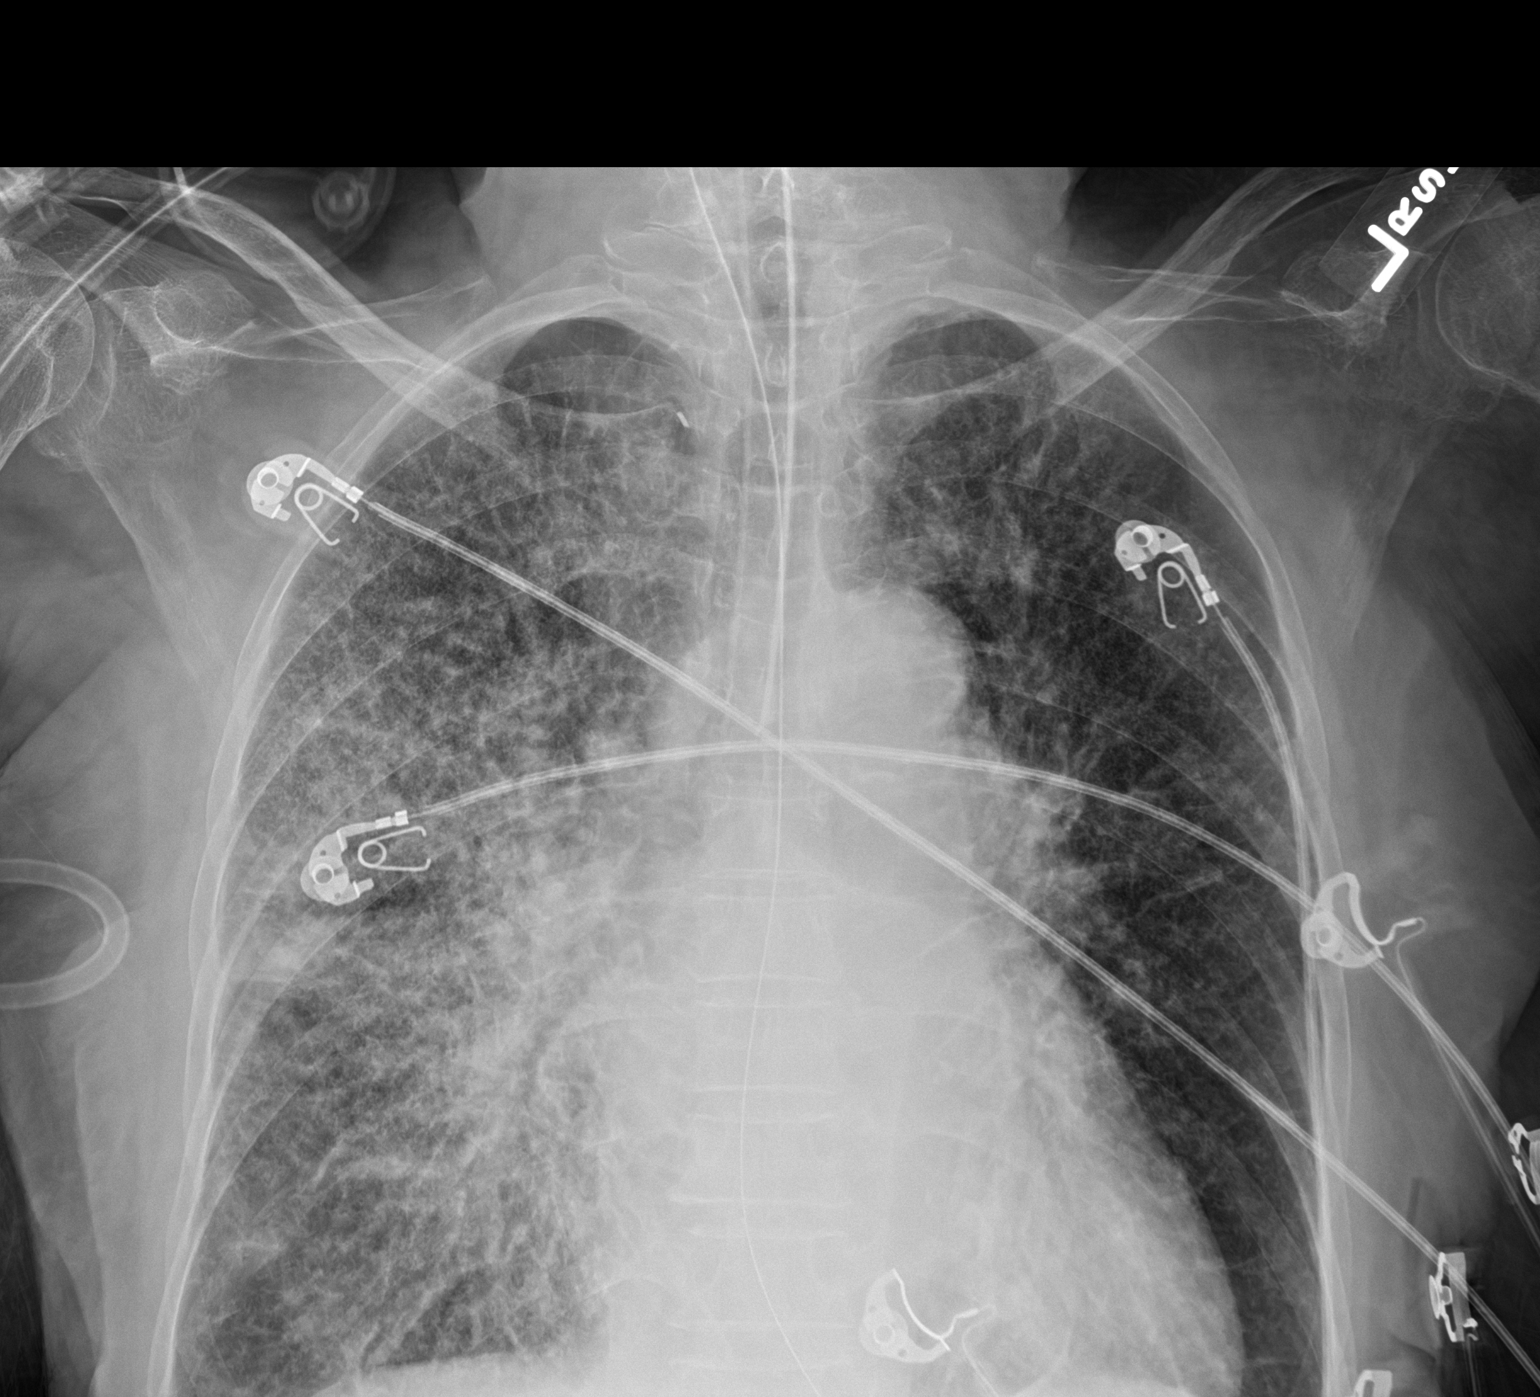

[chest ap (2 of 2)]
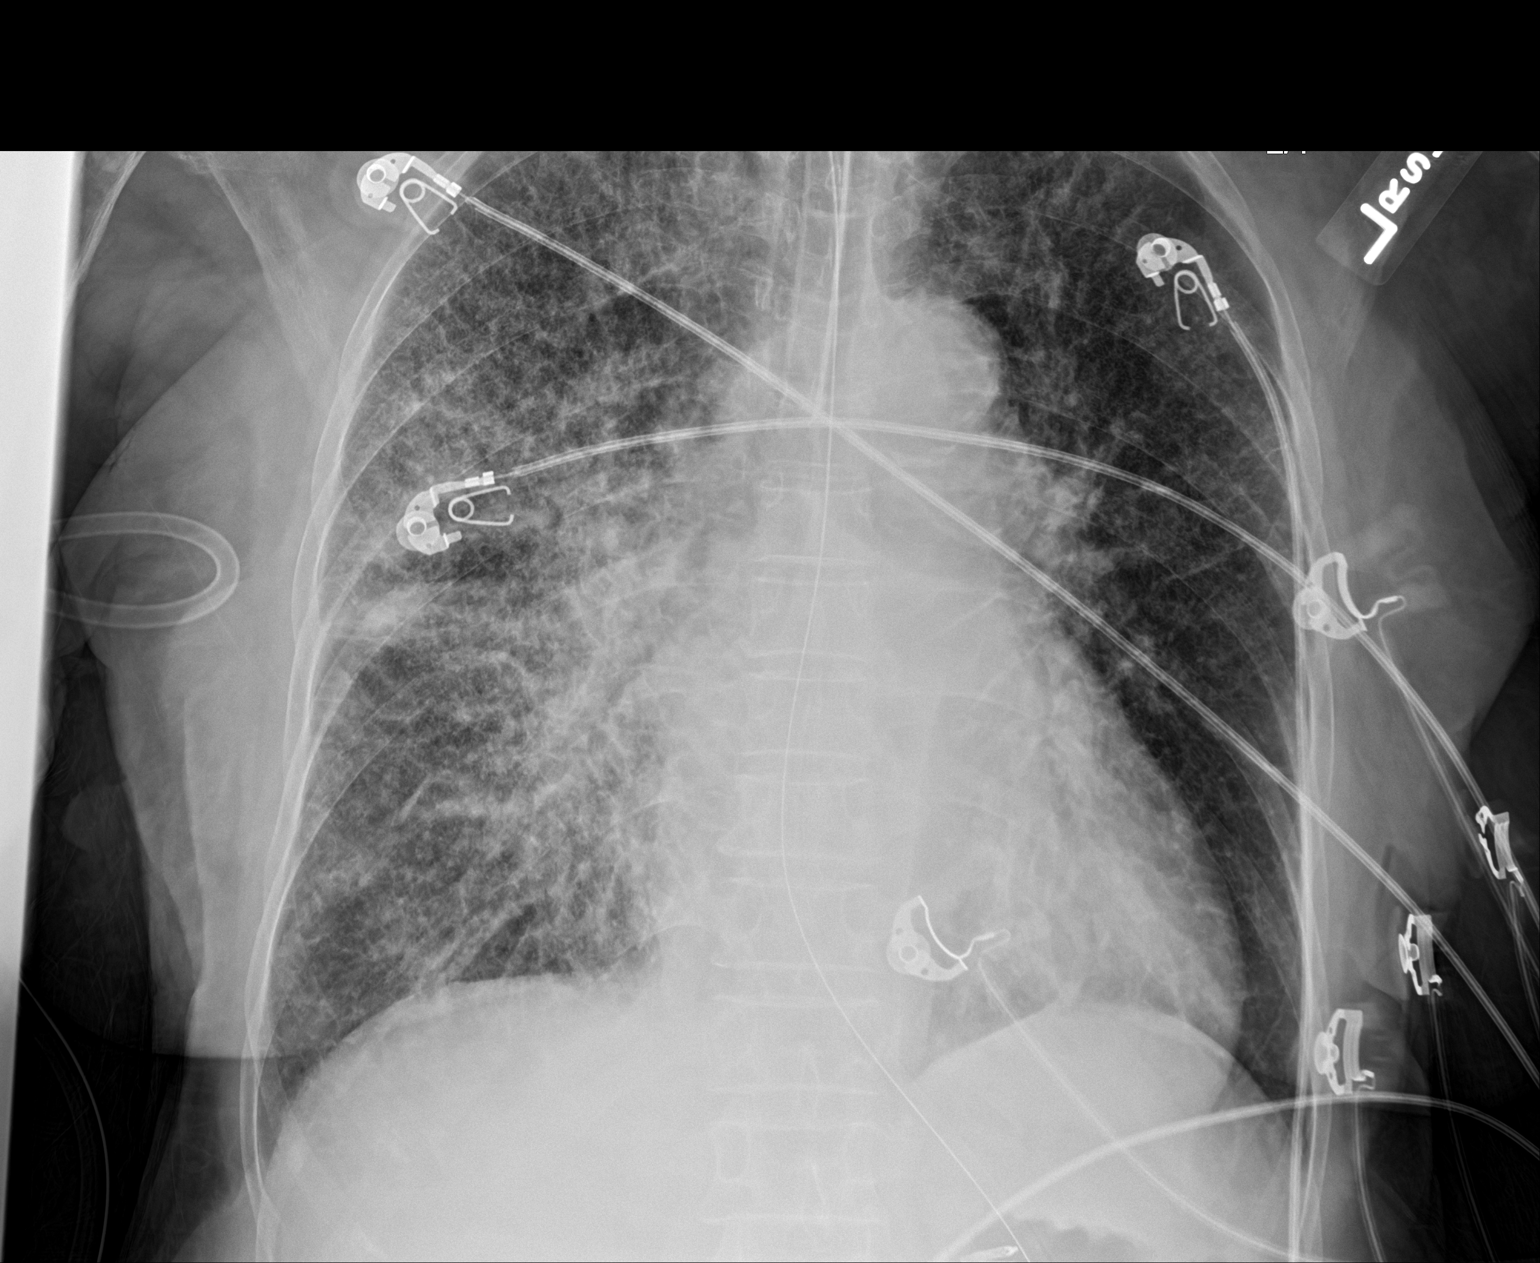

[2 of 2 positions shown; findings below may reference images not displayed]

FINDINGS: Endotracheal tube tip is just above the carina. Retraction by 5.5 cm
would place it at the level of the clavicular heads. Orogastric tube
tip and side port are beyond the field of view.

Interstitial opacities in the right lung have worsened since the
prior study. Left lung is clear.
IMPRESSION: 1. Endotracheal tube tip just above the carina. Recommend retraction
by 3-5 cm.
2. Markedly worsened interstitial opacities within the right lung
which may indicate infection or asymmetric pulmonary edema.

## 2019-08-07 IMAGING — CR DG HIP (WITH OR WITHOUT PELVIS) 2-3V LEFT
3 series · 3 of 3 positions shown · non-contrast
Comparison: CT 03/29/2018

CLINICAL DATA: Fall

EXAM:
DG HIP (WITH OR WITHOUT PELVIS) 2-3V LEFT

[x pelvis (1 of 2)]
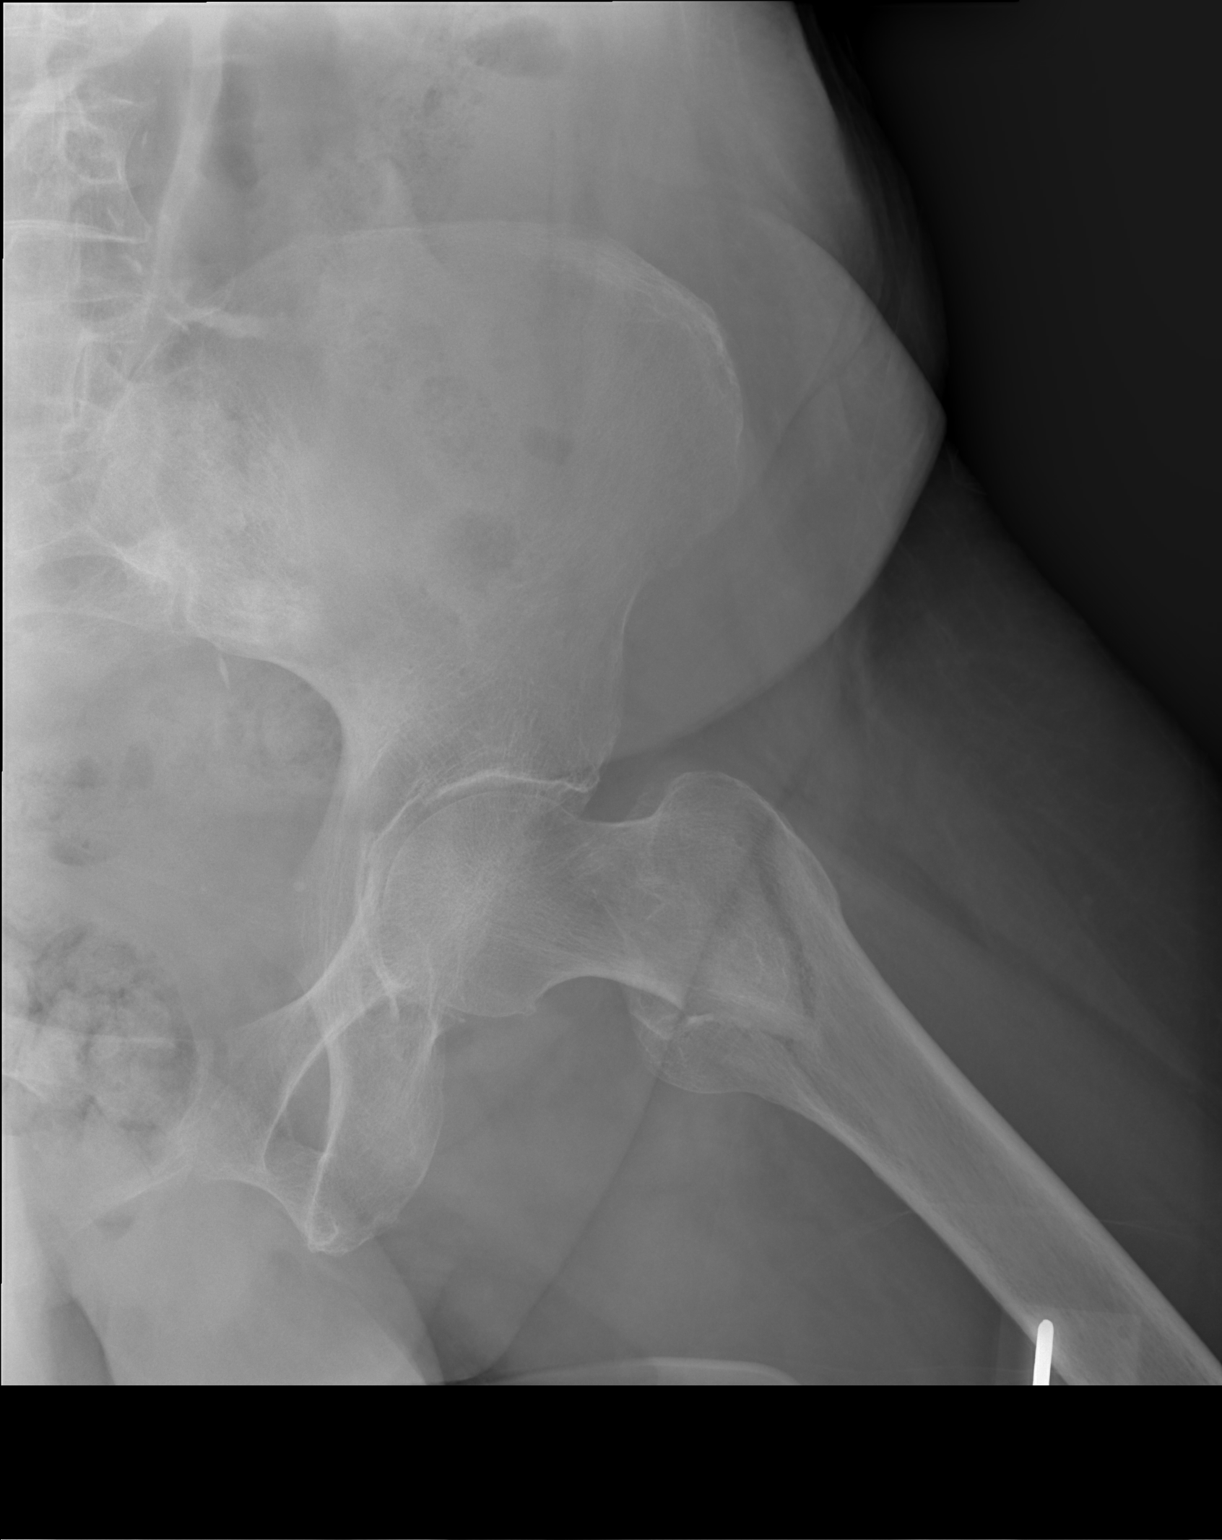

[x pelvis (2 of 2)]
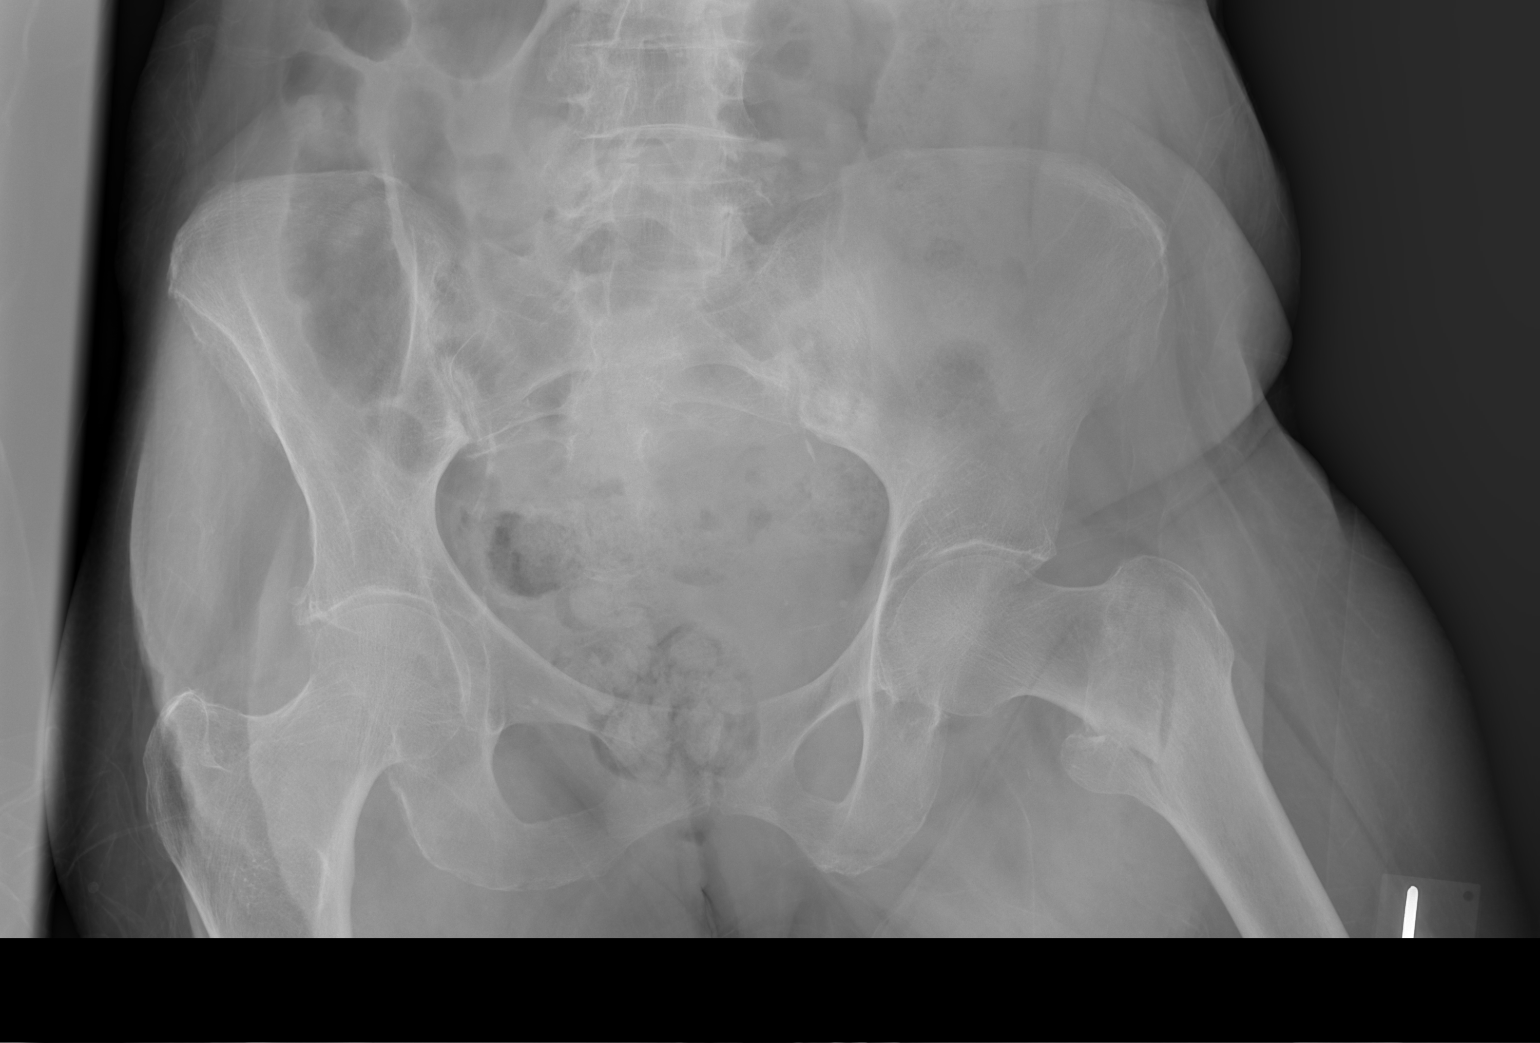

[w hip lat left]
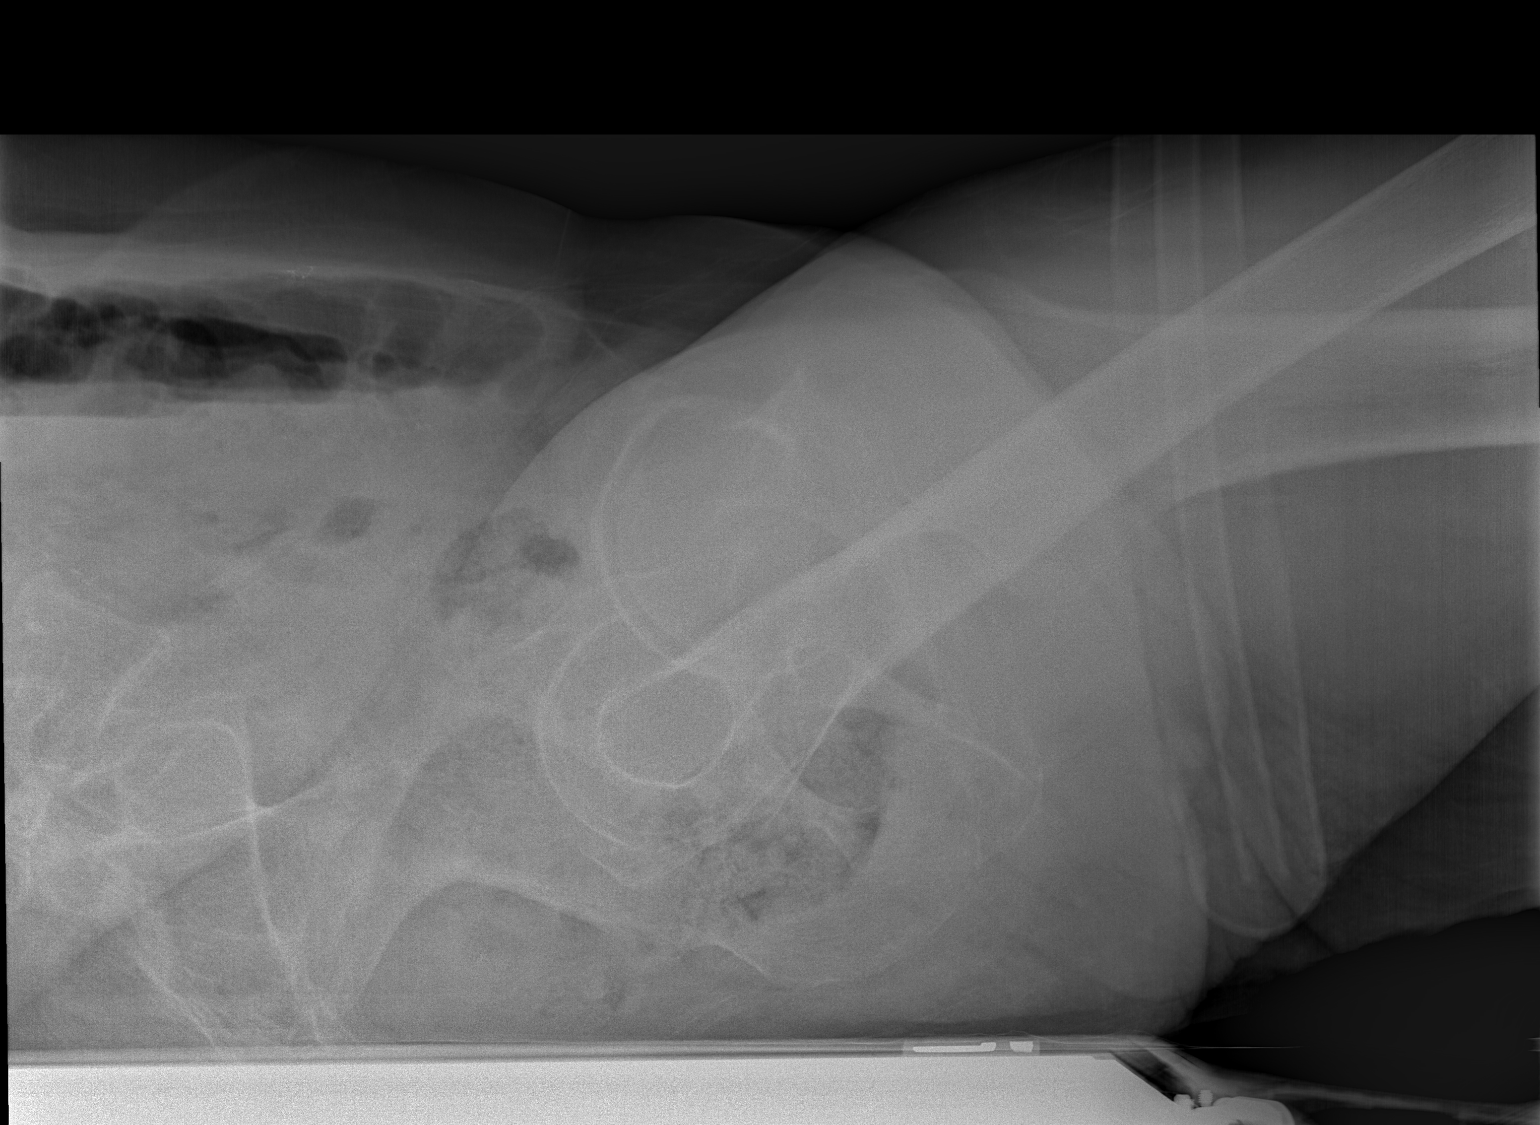

[3 of 3 positions shown; findings below may reference images not displayed]

FINDINGS: Acute mildly comminuted left intertrochanteric fracture. Left
femoral head projects in joint. Mild arthritis of both hips. Pubic
symphysis is intact
IMPRESSION: Acute mildly comminuted left intertrochanteric fracture
